# Patient Record
Sex: Female | Born: 1955 | Race: Black or African American | Hispanic: No | Marital: Married | Smoking: Never smoker
Health system: Southern US, Community
[De-identification: ages and names within clinical notes are randomized; demographics above are authoritative.]

## PROBLEM LIST (undated history)

## (undated) ENCOUNTER — Emergency Department (HOSPITAL_COMMUNITY): Payer: 59 | Source: Home / Self Care

## (undated) DIAGNOSIS — I10 Essential (primary) hypertension: Secondary | ICD-10-CM

## (undated) DIAGNOSIS — Z872 Personal history of diseases of the skin and subcutaneous tissue: Secondary | ICD-10-CM

## (undated) DIAGNOSIS — Z7901 Long term (current) use of anticoagulants: Secondary | ICD-10-CM

## (undated) DIAGNOSIS — K649 Unspecified hemorrhoids: Secondary | ICD-10-CM

## (undated) DIAGNOSIS — D259 Leiomyoma of uterus, unspecified: Secondary | ICD-10-CM

## (undated) DIAGNOSIS — K603 Anal fistula: Secondary | ICD-10-CM

## (undated) DIAGNOSIS — I480 Paroxysmal atrial fibrillation: Secondary | ICD-10-CM

## (undated) DIAGNOSIS — K50114 Crohn's disease of large intestine with abscess: Principal | ICD-10-CM

## (undated) DIAGNOSIS — R011 Cardiac murmur, unspecified: Secondary | ICD-10-CM

## (undated) DIAGNOSIS — K6289 Other specified diseases of anus and rectum: Secondary | ICD-10-CM

## (undated) DIAGNOSIS — K5010 Crohn's disease of large intestine without complications: Secondary | ICD-10-CM

## (undated) DIAGNOSIS — Z973 Presence of spectacles and contact lenses: Secondary | ICD-10-CM

## (undated) DIAGNOSIS — E785 Hyperlipidemia, unspecified: Secondary | ICD-10-CM

## (undated) HISTORY — DX: Hyperlipidemia, unspecified: E78.5

## (undated) HISTORY — DX: Crohn's disease of large intestine with abscess: K50.114

## (undated) HISTORY — PX: COLONOSCOPY WITH PROPOFOL: SHX5780

## (undated) HISTORY — DX: Essential (primary) hypertension: I10

## (undated) HISTORY — PX: COLONOSCOPY: SHX174

---

## 2010-11-15 ENCOUNTER — Other Ambulatory Visit (HOSPITAL_COMMUNITY): Payer: Self-pay | Admitting: Family Medicine

## 2010-11-15 DIAGNOSIS — Z1231 Encounter for screening mammogram for malignant neoplasm of breast: Secondary | ICD-10-CM

## 2010-11-23 ENCOUNTER — Other Ambulatory Visit (HOSPITAL_COMMUNITY): Payer: Self-pay | Admitting: Unknown Physician Specialty

## 2010-11-23 DIAGNOSIS — Z1231 Encounter for screening mammogram for malignant neoplasm of breast: Secondary | ICD-10-CM

## 2011-01-04 ENCOUNTER — Ambulatory Visit (HOSPITAL_COMMUNITY): Payer: Self-pay

## 2011-06-18 ENCOUNTER — Ambulatory Visit (HOSPITAL_COMMUNITY)
Admission: RE | Admit: 2011-06-18 | Discharge: 2011-06-18 | Disposition: A | Discharge status: Home / Self Care | Payer: 59 | Source: Ambulatory Visit | Attending: Unknown Physician Specialty | Admitting: Unknown Physician Specialty

## 2011-06-18 DIAGNOSIS — Z1231 Encounter for screening mammogram for malignant neoplasm of breast: Secondary | ICD-10-CM | POA: Insufficient documentation

## 2012-08-11 ENCOUNTER — Other Ambulatory Visit (INDEPENDENT_AMBULATORY_CARE_PROVIDER_SITE_OTHER): Payer: 59

## 2012-08-11 ENCOUNTER — Ambulatory Visit (INDEPENDENT_AMBULATORY_CARE_PROVIDER_SITE_OTHER): Payer: 59 | Admitting: Internal Medicine

## 2012-08-11 ENCOUNTER — Encounter: Payer: Self-pay | Admitting: Internal Medicine

## 2012-08-11 VITALS — BP 138/80 | HR 77 | Temp 97.9°F | Resp 12 | Wt 179.0 lb

## 2012-08-11 DIAGNOSIS — I10 Essential (primary) hypertension: Secondary | ICD-10-CM | POA: Insufficient documentation

## 2012-08-11 DIAGNOSIS — R200 Anesthesia of skin: Secondary | ICD-10-CM | POA: Insufficient documentation

## 2012-08-11 DIAGNOSIS — Z0000 Encounter for general adult medical examination without abnormal findings: Secondary | ICD-10-CM

## 2012-08-11 DIAGNOSIS — Z1231 Encounter for screening mammogram for malignant neoplasm of breast: Secondary | ICD-10-CM

## 2012-08-11 DIAGNOSIS — R011 Cardiac murmur, unspecified: Secondary | ICD-10-CM | POA: Insufficient documentation

## 2012-08-11 DIAGNOSIS — R209 Unspecified disturbances of skin sensation: Secondary | ICD-10-CM

## 2012-08-11 DIAGNOSIS — E785 Hyperlipidemia, unspecified: Secondary | ICD-10-CM

## 2012-08-11 LAB — URINALYSIS, ROUTINE W REFLEX MICROSCOPIC
Ketones, ur: NEGATIVE
Specific Gravity, Urine: <=1.005 (ref 1.000–1.030)
Urine Glucose: NEGATIVE
Urobilinogen, UA: 0.2 (ref 0.0–1.0)

## 2012-08-11 LAB — VITAMIN B12: Vitamin B-12: 244 pg/mL (ref 211–911)

## 2012-08-11 LAB — COMPREHENSIVE METABOLIC PANEL
ALT: 18 U/L (ref 0–35)
AST: 17 U/L (ref 0–37)
Albumin: 4.3 g/dL (ref 3.5–5.2)
Alkaline Phosphatase: 111 U/L (ref 39–117)
Potassium: 3.4 mEq/L — ABNORMAL LOW (ref 3.5–5.1)
Sodium: 136 mEq/L (ref 135–145)
Total Protein: 8.1 g/dL (ref 6.0–8.3)

## 2012-08-11 LAB — LIPID PANEL
Cholesterol: 157 mg/dL (ref 0–200)
HDL: 44 mg/dL (ref >39.00)
LDL Cholesterol: 98 mg/dL (ref 0–99)
VLDL: 15.2 mg/dL (ref 0.0–40.0)

## 2012-08-11 LAB — CBC WITH DIFFERENTIAL/PLATELET
Basophils Absolute: 0 10*3/uL (ref 0.0–0.1)
Eosinophils Absolute: 0.2 10*3/uL (ref 0.0–0.7)
Lymphocytes Relative: 10.9 % — ABNORMAL LOW (ref 12.0–46.0)
MCHC: 33.6 g/dL (ref 30.0–36.0)
MCV: 87.6 fl (ref 78.0–100.0)
Monocytes Absolute: 0.6 10*3/uL (ref 0.1–1.0)
Neutrophils Relative %: 82.8 % — ABNORMAL HIGH (ref 43.0–77.0)
Platelets: 335 10*3/uL (ref 150.0–400.0)

## 2012-08-11 LAB — TSH: TSH: 1.11 u[IU]/mL (ref 0.35–5.50)

## 2012-08-11 MED ORDER — AMLODIPINE BESYLATE 5 MG PO TABS: 5.0000 mg | ORAL_TABLET | Freq: Every day | ORAL | Status: DC

## 2012-08-11 MED ORDER — METOPROLOL SUCCINATE ER 25 MG PO TB24: 25.0000 mg | ORAL_TABLET | Freq: Every day | ORAL | Status: DC

## 2012-08-11 MED ORDER — TRIAMTERENE-HCTZ 37.5-25 MG PO CAPS: 0.5000 | ORAL_CAPSULE | Freq: Every day | ORAL | Status: DC

## 2012-08-11 NOTE — Progress Notes (Signed)
Subjective:    Patient ID: Julie Clark, female    DOB: 24-Jun-1955, 57 y.o.   MRN: [id number]  Hyperlipidemia This is a chronic problem. The current episode started more than 1 year ago. The problem is controlled. Recent lipid tests were reviewed and are variable. She has no history of chronic renal disease, diabetes, hypothyroidism, liver disease, obesity or nephrotic syndrome. Factors aggravating her hyperlipidemia include thiazides. Associated symptoms include myalgias. Pertinent negatives include no chest pain, focal sensory loss, focal weakness, leg pain or shortness of breath. Current antihyperlipidemic treatment includes statins. The current treatment provides moderate improvement of lipids. Compliance problems include adherence to exercise and adherence to diet.       Review of Systems  Constitutional: Positive for fatigue. Negative for fever, chills, diaphoresis, activity change, appetite change and unexpected weight change.  HENT: Negative.   Eyes: Negative.   Respiratory: Negative for cough, chest tightness, shortness of breath, wheezing and stridor.   Cardiovascular: Negative for chest pain, palpitations and leg swelling.  Gastrointestinal: Negative for nausea, vomiting, abdominal pain, diarrhea, constipation and blood in stool.  Endocrine: Negative.   Genitourinary: Negative.   Musculoskeletal: Positive for myalgias. Negative for back pain, joint swelling and arthralgias.  Skin: Negative for color change, pallor, rash and wound.  Allergic/Immunologic: Negative.   Neurological: Positive for numbness (in both arms and hands for one year). Negative for dizziness, tremors, focal weakness, seizures, syncope, facial asymmetry, speech difficulty, weakness, light-headedness and headaches.  Hematological: Negative.  Negative for adenopathy. Does not bruise/bleed easily.  Psychiatric/Behavioral: Negative.        Objective:   Physical Exam  Vitals reviewed. Constitutional: She is  oriented to person, place, and time. She appears well-developed and well-nourished. No distress.  HENT:  Head: Normocephalic and atraumatic.  Mouth/Throat: Oropharynx is clear and moist. No oropharyngeal exudate.  Eyes: Conjunctivae are normal. Right eye exhibits no discharge. Left eye exhibits no discharge. No scleral icterus.  Neck: Normal range of motion. Neck supple. No JVD present. No tracheal deviation present. No thyromegaly present.  Cardiovascular: Normal rate, regular rhythm, S1 normal, S2 normal and intact distal pulses.  PMI is not displaced.  Exam reveals no gallop, no S3, no S4, no distant heart sounds and no friction rub.   Murmur heard.  Decrescendo systolic murmur is present with a grade of 1/6   No diastolic murmur is present  Pulses:      Carotid pulses are 1+ on the right side, and 1+ on the left side.      Radial pulses are 1+ on the right side, and 1+ on the left side.       Femoral pulses are 1+ on the right side, and 1+ on the left side.      Popliteal pulses are 1+ on the right side, and 1+ on the left side.       Dorsalis pedis pulses are 1+ on the right side, and 1+ on the left side.       Posterior tibial pulses are 1+ on the right side, and 1+ on the left side.  Pulmonary/Chest: Effort normal and breath sounds normal. No stridor. No respiratory distress. She has no wheezes. She has no rales. She exhibits no tenderness.  Abdominal: Soft. Bowel sounds are normal. She exhibits no distension and no mass. There is no tenderness. There is no rebound and no guarding.  Genitourinary:  Deferred at her request  Musculoskeletal: Normal range of motion. She exhibits no edema and no tenderness.  Lymphadenopathy:    She has no cervical adenopathy.  Neurological: She is alert and oriented to person, place, and time. She has normal strength. She displays no atrophy, no tremor and normal reflexes. No cranial nerve deficit or sensory deficit. She exhibits normal muscle tone. She  displays a negative Romberg sign. She displays no seizure activity. Coordination and gait normal. She displays no Babinski's sign on the right side. She displays no Babinski's sign on the left side.  Reflex Scores:      Tricep reflexes are 1+ on the right side and 1+ on the left side.      Bicep reflexes are 1+ on the right side and 1+ on the left side.      Brachioradialis reflexes are 1+ on the right side and 1+ on the left side.      Patellar reflexes are 1+ on the right side and 1+ on the left side.      Achilles reflexes are 1+ on the right side and 1+ on the left side. Skin: Skin is warm and dry. No rash noted. She is not diaphoretic. No erythema. No pallor.  Psychiatric: She has a normal mood and affect. Her behavior is normal. Judgment and thought content normal.     No results found for this basename: WBC, HGB, HCT, PLT, GLUCOSE, CHOL, TRIG, HDL, LDLDIRECT, LDLCALC, ALT, AST, NA, K, CL, CREATININE, BUN, CO2, TSH, PSA, INR, GLUF, HGBA1C, MICROALBUR       Assessment & Plan:

## 2012-08-11 NOTE — Assessment & Plan Note (Signed)
She has pain so I have advised her to stop the statin I will check her labs today

## 2012-08-11 NOTE — Assessment & Plan Note (Signed)
EKG is normal today She has no s/s and does not wish to evaluate this any further at this time

## 2012-08-11 NOTE — Assessment & Plan Note (Addendum)
Exam done Vaccines were reviewed and updated Labs ordered Pt ed material was given She will f/up with her GYN for breast/pelvic/rectal exams She was referred today for mammogram and colonoscopy

## 2012-08-11 NOTE — Patient Instructions (Signed)
Preventive Care for Adults, Female A healthy lifestyle and preventive care can promote health and wellness. Preventive health guidelines for women include the following key practices.  A routine yearly physical is a good way to check with your caregiver about your health and preventive screening. It is a chance to share any concerns and updates on your health, and to receive a thorough exam.  Visit your dentist for a routine exam and preventive care every 6 months. Brush your teeth twice a day and floss once a day. Good oral hygiene prevents tooth decay and gum disease.  The frequency of eye exams is based on your age, health, family medical history, use of contact lenses, and other factors. Follow your caregiver's recommendations for frequency of eye exams.  Eat a healthy diet. Foods like vegetables, fruits, whole grains, low-fat dairy products, and lean protein foods contain the nutrients you need without too many calories. Decrease your intake of foods high in solid fats, added sugars, and salt. Eat the right amount of calories for you.Get information about a proper diet from your caregiver, if necessary.  Regular physical exercise is one of the most important things you can do for your health. Most adults should get at least 150 minutes of moderate-intensity exercise (any activity that increases your heart rate and causes you to sweat) each week. In addition, most adults need muscle-strengthening exercises on 2 or more days a week.  Maintain a healthy weight. The body mass index (BMI) is a screening tool to identify possible weight problems. It provides an estimate of body fat based on height and weight. Your caregiver can help determine your BMI, and can help you achieve or maintain a healthy weight.For adults 20 years and older:  A BMI below 18.5 is considered underweight.  A BMI of 18.5 to 24.9 is normal.  A BMI of 25 to 29.9 is considered overweight.  A BMI of 30 and above is  considered obese.  Maintain normal blood lipids and cholesterol levels by exercising and minimizing your intake of saturated fat. Eat a balanced diet with plenty of fruit and vegetables. Blood tests for lipids and cholesterol should begin at age 20 and be repeated every 5 years. If your lipid or cholesterol levels are high, you are over 50, or you are at high risk for heart disease, you may need your cholesterol levels checked more frequently.Ongoing high lipid and cholesterol levels should be treated with medicines if diet and exercise are not effective.  If you smoke, find out from your caregiver how to quit. If you do not use tobacco, do not start.  If you are pregnant, do not drink alcohol. If you are breastfeeding, be very cautious about drinking alcohol. If you are not pregnant and choose to drink alcohol, do not exceed 1 drink per day. One drink is considered to be 12 ounces (355 mL) of beer, 5 ounces (148 mL) of wine, or 1.5 ounces (44 mL) of liquor.  Avoid use of street drugs. Do not share needles with anyone. Ask for help if you need support or instructions about stopping the use of drugs.  High blood pressure causes heart disease and increases the risk of stroke. Your blood pressure should be checked at least every 1 to 2 years. Ongoing high blood pressure should be treated with medicines if weight loss and exercise are not effective.  If you are 55 to 57 years old, ask your caregiver if you should take aspirin to prevent strokes.  Diabetes   screening involves taking a blood sample to check your fasting blood sugar level. This should be done once every 3 years, after age 45, if you are within normal weight and without risk factors for diabetes. Testing should be considered at a younger age or be carried out more frequently if you are overweight and have at least 1 risk factor for diabetes.  Breast cancer screening is essential preventive care for women. You should practice "breast  self-awareness." This means understanding the normal appearance and feel of your breasts and may include breast self-examination. Any changes detected, no matter how small, should be reported to a caregiver. Women in their 20s and 30s should have a clinical breast exam (CBE) by a caregiver as part of a regular health exam every 1 to 3 years. After age 40, women should have a CBE every year. Starting at age 40, women should consider having a mammography (breast X-ray test) every year. Women who have a family history of breast cancer should talk to their caregiver about genetic screening. Women at a high risk of breast cancer should talk to their caregivers about having magnetic resonance imaging (MRI) and a mammography every year.  The Pap test is a screening test for cervical cancer. A Pap test can show cell changes on the cervix that might become cervical cancer if left untreated. A Pap test is a procedure in which cells are obtained and examined from the lower end of the uterus (cervix).  Women should have a Pap test starting at age 21.  Between ages 21 and 29, Pap tests should be repeated every 2 years.  Beginning at age 30, you should have a Pap test every 3 years as long as the past 3 Pap tests have been normal.  Some women have medical problems that increase the chance of getting cervical cancer. Talk to your caregiver about these problems. It is especially important to talk to your caregiver if a new problem develops soon after your last Pap test. In these cases, your caregiver may recommend more frequent screening and Pap tests.  The above recommendations are the same for women who have or have not gotten the vaccine for human papillomavirus (HPV).  If you had a hysterectomy for a problem that was not cancer or a condition that could lead to cancer, then you no longer need Pap tests. Even if you no longer need a Pap test, a regular exam is a good idea to make sure no other problems are  starting.  If you are between ages 65 and 70, and you have had normal Pap tests going back 10 years, you no longer need Pap tests. Even if you no longer need a Pap test, a regular exam is a good idea to make sure no other problems are starting.  If you have had past treatment for cervical cancer or a condition that could lead to cancer, you need Pap tests and screening for cancer for at least 20 years after your treatment.  If Pap tests have been discontinued, risk factors (such as a new sexual partner) need to be reassessed to determine if screening should be resumed.  The HPV test is an additional test that may be used for cervical cancer screening. The HPV test looks for the virus that can cause the cell changes on the cervix. The cells collected during the Pap test can be tested for HPV. The HPV test could be used to screen women aged 30 years and older, and should   be used in women of any age who have unclear Pap test results. After the age of 30, women should have HPV testing at the same frequency as a Pap test.  Colorectal cancer can be detected and often prevented. Most routine colorectal cancer screening begins at the age of 50 and continues through age 75. However, your caregiver may recommend screening at an earlier age if you have risk factors for colon cancer. On a yearly basis, your caregiver may provide home test kits to check for hidden blood in the stool. Use of a small camera at the end of a tube, to directly examine the colon (sigmoidoscopy or colonoscopy), can detect the earliest forms of colorectal cancer. Talk to your caregiver about this at age 50, when routine screening begins. Direct examination of the colon should be repeated every 5 to 10 years through age 75, unless early forms of pre-cancerous polyps or small growths are found.  Hepatitis C blood testing is recommended for all people born from 1945 through 1965 and any individual with known risks for hepatitis C.  Practice  safe sex. Use condoms and avoid high-risk sexual practices to reduce the spread of sexually transmitted infections (STIs). STIs include gonorrhea, chlamydia, syphilis, trichomonas, herpes, HPV, and human immunodeficiency virus (HIV). Herpes, HIV, and HPV are viral illnesses that have no cure. They can result in disability, cancer, and death. Sexually active women aged 25 and younger should be checked for chlamydia. Older women with new or multiple partners should also be tested for chlamydia. Testing for other STIs is recommended if you are sexually active and at increased risk.  Osteoporosis is a disease in which the bones lose minerals and strength with aging. This can result in serious bone fractures. The risk of osteoporosis can be identified using a bone density scan. Women ages 65 and over and women at risk for fractures or osteoporosis should discuss screening with their caregivers. Ask your caregiver whether you should take a calcium supplement or vitamin D to reduce the rate of osteoporosis.  Menopause can be associated with physical symptoms and risks. Hormone replacement therapy is available to decrease symptoms and risks. You should talk to your caregiver about whether hormone replacement therapy is right for you.  Use sunscreen with sun protection factor (SPF) of 30 or more. Apply sunscreen liberally and repeatedly throughout the day. You should seek shade when your shadow is shorter than you. Protect yourself by wearing long sleeves, pants, a wide-brimmed hat, and sunglasses year round, whenever you are outdoors.  Once a month, do a whole body skin exam, using a mirror to look at the skin on your back. Notify your caregiver of new moles, moles that have irregular borders, moles that are larger than a pencil eraser, or moles that have changed in shape or color.  Stay current with required immunizations.  Influenza. You need a dose every fall (or winter). The composition of the flu vaccine  changes each year, so being vaccinated once is not enough.  Pneumococcal polysaccharide. You need 1 to 2 doses if you smoke cigarettes or if you have certain chronic medical conditions. You need 1 dose at age 65 (or older) if you have never been vaccinated.  Tetanus, diphtheria, pertussis (Tdap, Td). Get 1 dose of Tdap vaccine if you are younger than age 65, are over 65 and have contact with an infant, are a healthcare worker, are pregnant, or simply want to be protected from whooping cough. After that, you need a Td   booster dose every 10 years. Consult your caregiver if you have not had at least 3 tetanus and diphtheria-containing shots sometime in your life or have a deep or dirty wound.  HPV. You need this vaccine if you are a woman age 26 or younger. The vaccine is given in 3 doses over 6 months.  Measles, mumps, rubella (MMR). You need at least 1 dose of MMR if you were born in 1957 or later. You may also need a second dose.  Meningococcal. If you are age 19 to 21 and a first-year college student living in a residence hall, or have one of several medical conditions, you need to get vaccinated against meningococcal disease. You may also need additional booster doses.  Zoster (shingles). If you are age 60 or older, you should get this vaccine.  Varicella (chickenpox). If you have never had chickenpox or you were vaccinated but received only 1 dose, talk to your caregiver to find out if you need this vaccine.  Hepatitis A. You need this vaccine if you have a specific risk factor for hepatitis A virus infection or you simply wish to be protected from this disease. The vaccine is usually given as 2 doses, 6 to 18 months apart.  Hepatitis B. You need this vaccine if you have a specific risk factor for hepatitis B virus infection or you simply wish to be protected from this disease. The vaccine is given in 3 doses, usually over 6 months. Preventive Services / Frequency Ages 19 to 39  Blood  pressure check.** / Every 1 to 2 years.  Lipid and cholesterol check.** / Every 5 years beginning at age 20.  Clinical breast exam.** / Every 3 years for women in their 20s and 30s.  Pap test.** / Every 2 years from ages 21 through 29. Every 3 years starting at age 30 through age 65 or 70 with a history of 3 consecutive normal Pap tests.  HPV screening.** / Every 3 years from ages 30 through ages 65 to 70 with a history of 3 consecutive normal Pap tests.  Hepatitis C blood test.** / For any individual with known risks for hepatitis C.  Skin self-exam. / Monthly.  Influenza immunization.** / Every year.  Pneumococcal polysaccharide immunization.** / 1 to 2 doses if you smoke cigarettes or if you have certain chronic medical conditions.  Tetanus, diphtheria, pertussis (Tdap, Td) immunization. / A one-time dose of Tdap vaccine. After that, you need a Td booster dose every 10 years.  HPV immunization. / 3 doses over 6 months, if you are 26 and younger.  Measles, mumps, rubella (MMR) immunization. / You need at least 1 dose of MMR if you were born in 1957 or later. You may also need a second dose.  Meningococcal immunization. / 1 dose if you are age 19 to 21 and a first-year college student living in a residence hall, or have one of several medical conditions, you need to get vaccinated against meningococcal disease. You may also need additional booster doses.  Varicella immunization.** / Consult your caregiver.  Hepatitis A immunization.** / Consult your caregiver. 2 doses, 6 to 18 months apart.  Hepatitis B immunization.** / Consult your caregiver. 3 doses usually over 6 months. Ages 40 to 64  Blood pressure check.** / Every 1 to 2 years.  Lipid and cholesterol check.** / Every 5 years beginning at age 20.  Clinical breast exam.** / Every year after age 40.  Mammogram.** / Every year beginning at age 40   and continuing for as long as you are in good health. Consult with your  caregiver.  Pap test.** / Every 3 years starting at age 30 through age 65 or 70 with a history of 3 consecutive normal Pap tests.  HPV screening.** / Every 3 years from ages 30 through ages 65 to 70 with a history of 3 consecutive normal Pap tests.  Fecal occult blood test (FOBT) of stool. / Every year beginning at age 50 and continuing until age 75. You may not need to do this test if you get a colonoscopy every 10 years.  Flexible sigmoidoscopy or colonoscopy.** / Every 5 years for a flexible sigmoidoscopy or every 10 years for a colonoscopy beginning at age 50 and continuing until age 75.  Hepatitis C blood test.** / For all people born from 1945 through 1965 and any individual with known risks for hepatitis C.  Skin self-exam. / Monthly.  Influenza immunization.** / Every year.  Pneumococcal polysaccharide immunization.** / 1 to 2 doses if you smoke cigarettes or if you have certain chronic medical conditions.  Tetanus, diphtheria, pertussis (Tdap, Td) immunization.** / A one-time dose of Tdap vaccine. After that, you need a Td booster dose every 10 years.  Measles, mumps, rubella (MMR) immunization. / You need at least 1 dose of MMR if you were born in 1957 or later. You may also need a second dose.  Varicella immunization.** / Consult your caregiver.  Meningococcal immunization.** / Consult your caregiver.  Hepatitis A immunization.** / Consult your caregiver. 2 doses, 6 to 18 months apart.  Hepatitis B immunization.** / Consult your caregiver. 3 doses, usually over 6 months. Ages 65 and over  Blood pressure check.** / Every 1 to 2 years.  Lipid and cholesterol check.** / Every 5 years beginning at age 20.  Clinical breast exam.** / Every year after age 40.  Mammogram.** / Every year beginning at age 40 and continuing for as long as you are in good health. Consult with your caregiver.  Pap test.** / Every 3 years starting at age 30 through age 65 or 70 with a 3  consecutive normal Pap tests. Testing can be stopped between 65 and 70 with 3 consecutive normal Pap tests and no abnormal Pap or HPV tests in the past 10 years.  HPV screening.** / Every 3 years from ages 30 through ages 65 or 70 with a history of 3 consecutive normal Pap tests. Testing can be stopped between 65 and 70 with 3 consecutive normal Pap tests and no abnormal Pap or HPV tests in the past 10 years.  Fecal occult blood test (FOBT) of stool. / Every year beginning at age 50 and continuing until age 75. You may not need to do this test if you get a colonoscopy every 10 years.  Flexible sigmoidoscopy or colonoscopy.** / Every 5 years for a flexible sigmoidoscopy or every 10 years for a colonoscopy beginning at age 50 and continuing until age 75.  Hepatitis C blood test.** / For all people born from 1945 through 1965 and any individual with known risks for hepatitis C.  Osteoporosis screening.** / A one-time screening for women ages 65 and over and women at risk for fractures or osteoporosis.  Skin self-exam. / Monthly.  Influenza immunization.** / Every year.  Pneumococcal polysaccharide immunization.** / 1 dose at age 65 (or older) if you have never been vaccinated.  Tetanus, diphtheria, pertussis (Tdap, Td) immunization. / A one-time dose of Tdap vaccine if you are over   65 and have contact with an infant, are a healthcare worker, or simply want to be protected from whooping cough. After that, you need a Td booster dose every 10 years.  Varicella immunization.** / Consult your caregiver.  Meningococcal immunization.** / Consult your caregiver.  Hepatitis A immunization.** / Consult your caregiver. 2 doses, 6 to 18 months apart.  Hepatitis B immunization.** / Check with your caregiver. 3 doses, usually over 6 months. ** Family history and personal history of risk and conditions may change your caregiver's recommendations. Document Released: 07/10/2001 Document Revised: 08/06/2011  Document Reviewed: 10/09/2010 [employer] Patient Information 2013 [employer].  

## 2012-08-11 NOTE — Assessment & Plan Note (Signed)
I will check her labs today to look for secondary causes for this I have also asked her to get NCS/EMG done to see what the cause is

## 2012-08-13 ENCOUNTER — Telehealth: Payer: Self-pay | Admitting: Internal Medicine

## 2012-08-13 NOTE — Telephone Encounter (Signed)
Wants clarity on what dosage of blood pressure medicine she needs to be taking.

## 2012-08-20 ENCOUNTER — Other Ambulatory Visit: Payer: Self-pay | Admitting: Internal Medicine

## 2012-08-20 DIAGNOSIS — I10 Essential (primary) hypertension: Secondary | ICD-10-CM

## 2012-08-20 MED ORDER — METOPROLOL SUCCINATE ER 25 MG PO TB24: 25.0000 mg | ORAL_TABLET | Freq: Two times a day (BID) | ORAL | Status: DC

## 2012-08-27 ENCOUNTER — Other Ambulatory Visit: Payer: Self-pay

## 2012-08-27 MED ORDER — TRIAMTERENE-HCTZ 37.5-25 MG PO TABS: 0.5000 | ORAL_TABLET | Freq: Every day | ORAL | Status: DC

## 2012-08-27 NOTE — Telephone Encounter (Signed)
Pharmacy [facility]) notified

## 2012-08-27 NOTE — Telephone Encounter (Signed)
Pharmacy called stating pt went to pick up Rx for Triamterene yesterday but dose was changed from 1/2 tablet daily to 1 capsule daily. Pt stated that she was not aware of any changes/increases in medication and did not pick up Rx. Pharmacy is requesting clarification, please advise.

## 2012-08-27 NOTE — Telephone Encounter (Signed)
I was able to find Rx for Triamterene HCTZ 37.5/25 and changed directions to 1/2 tab daily. Okay to update and resend?

## 2012-08-27 NOTE — Telephone Encounter (Signed)
1/2 tab is fine with me but it is not an option in [employer] to write an Rx that way

## 2012-10-01 ENCOUNTER — Encounter: Payer: Self-pay | Admitting: Internal Medicine

## 2012-10-01 ENCOUNTER — Ambulatory Visit (INDEPENDENT_AMBULATORY_CARE_PROVIDER_SITE_OTHER): Payer: 59 | Admitting: Internal Medicine

## 2012-10-01 ENCOUNTER — Other Ambulatory Visit (INDEPENDENT_AMBULATORY_CARE_PROVIDER_SITE_OTHER): Payer: 59

## 2012-10-01 VITALS — BP 128/72 | HR 81 | Temp 97.8°F | Resp 16 | Wt 179.0 lb

## 2012-10-01 DIAGNOSIS — L723 Sebaceous cyst: Secondary | ICD-10-CM

## 2012-10-01 DIAGNOSIS — I10 Essential (primary) hypertension: Secondary | ICD-10-CM

## 2012-10-01 DIAGNOSIS — E876 Hypokalemia: Secondary | ICD-10-CM

## 2012-10-01 DIAGNOSIS — R55 Syncope and collapse: Secondary | ICD-10-CM

## 2012-10-01 DIAGNOSIS — R011 Cardiac murmur, unspecified: Secondary | ICD-10-CM

## 2012-10-01 LAB — COMPREHENSIVE METABOLIC PANEL
ALT: 14 U/L (ref 0–35)
AST: 16 U/L (ref 0–37)
Creatinine, Ser: 0.9 mg/dL (ref 0.4–1.2)
Total Bilirubin: 0.6 mg/dL (ref 0.3–1.2)

## 2012-10-01 LAB — MAGNESIUM: Magnesium: 1.8 mg/dL (ref 1.5–2.5)

## 2012-10-01 MED ORDER — SULFAMETHOXAZOLE-TRIMETHOPRIM 800-160 MG PO TABS: 1.0000 | ORAL_TABLET | Freq: Two times a day (BID) | ORAL | Status: DC

## 2012-10-01 MED ORDER — HYDROCHLOROTHIAZIDE 25 MG PO TABS: 25.0000 mg | ORAL_TABLET | Freq: Every day | ORAL | Status: DC

## 2012-10-01 NOTE — Patient Instructions (Signed)
Syncope Syncope is a fainting spell. This means the person loses consciousness and drops to the ground. The person is generally unconscious for less than 5 minutes. The person may have some muscle twitches for up to 15 seconds before waking up and returning to normal. Syncope occurs more often in elderly people, but it can happen to anyone. While most causes of syncope are not dangerous, syncope can be a sign of a serious medical problem. It is important to seek medical care.  CAUSES  Syncope is caused by a sudden decrease in blood flow to the brain. The specific cause is often not determined. Factors that can trigger syncope include:  Taking medicines that lower blood pressure.  Sudden changes in posture, such as standing up suddenly.  Taking more medicine than prescribed.  Standing in one place for too long.  Seizure disorders.  Dehydration and excessive exposure to heat.  Low blood sugar (hypoglycemia).  Straining to have a bowel movement.  Heart disease, irregular heartbeat, or other circulatory problems.  Fear, emotional distress, seeing blood, or severe pain. SYMPTOMS  Right before fainting, you may:  Feel dizzy or lightheaded.  Feel nauseous.  See all white or all black in your field of vision.  Have cold, clammy skin. DIAGNOSIS  Your caregiver will ask about your symptoms, perform a physical exam, and perform electrocardiography (ECG) to record the electrical activity of your heart. Your caregiver may also perform other heart or blood tests to determine the cause of your syncope. TREATMENT  In most cases, no treatment is needed. Depending on the cause of your syncope, your caregiver may recommend changing or stopping some of your medicines. HOME CARE INSTRUCTIONS  Have someone stay with you until you feel stable.  Do not drive, operate machinery, or play sports until your caregiver says it is okay.  Keep all follow-up appointments as directed by your  caregiver.  Lie down right away if you start feeling like you might faint. Breathe deeply and steadily. Wait until all the symptoms have passed.  Drink enough fluids to keep your urine clear or pale yellow.  If you are taking blood pressure or heart medicine, get up slowly, taking several minutes to sit and then stand. This can reduce dizziness. SEEK IMMEDIATE MEDICAL CARE IF:   You have a severe headache.  You have unusual pain in the chest, abdomen, or back.  You are bleeding from the mouth or rectum, or you have black or tarry stool.  You have an irregular or very fast heartbeat.  You have pain with breathing.  You have repeated fainting or seizure-like jerking during an episode.  You faint when sitting or lying down.  You have confusion.  You have difficulty walking.  You have severe weakness.  You have vision problems. If you fainted, call your local emergency services (911 in [place] Do not drive yourself to the hospital.  MAKE SURE YOU:  Understand these instructions.  Will watch your condition.  Will get help right away if you are not doing well or get worse. Document Released: 05/14/2005 Document Revised: 11/13/2011 Document Reviewed: 07/13/2011 [facility] Patient Information 2013 [place].

## 2012-10-01 NOTE — Assessment & Plan Note (Signed)
This sounds like it was caused by low BP, orthostasis I have decreased her diuretic dose I have asked her to get an updated ECHO done Have also ordered an event monitor to see if she has a dysrhythmia

## 2012-10-01 NOTE — Progress Notes (Signed)
Subjective:    Patient ID: Julie Clark, female    DOB: August 04, 1955, 57 y.o.   MRN: [id number]  Loss of Consciousness This is a new problem. Episode onset: 3 days ago. The problem occurs rarely. Progression since onset: 1 episode, no recurrence. She lost consciousness for a period of less than 1 minute. Exacerbated by: her BP was 100/70, she felt lightheaded while getting out of her tub. Associated symptoms include light-headedness. Pertinent negatives include no abdominal pain, auditory change, aura, back pain, bladder incontinence, bowel incontinence, chest pain, clumsiness, confusion, diaphoresis, dizziness, fever, focal sensory loss, focal weakness, headaches, malaise/fatigue, nausea, palpitations, slurred speech, vertigo, visual change, vomiting or weakness. She has tried nothing for the symptoms. Her past medical history is significant for CAD and HTN. There is no history of arrhythmia, seizures, a sudden death in family, TIA or vertigo.      Review of Systems  Constitutional: Negative.  Negative for fever, chills, malaise/fatigue, diaphoresis, appetite change, fatigue and unexpected weight change.  HENT: Negative.   Eyes: Negative.   Respiratory: Negative.  Negative for apnea, cough, choking, chest tightness, shortness of breath, wheezing and stridor.   Cardiovascular: Positive for syncope. Negative for chest pain and palpitations.  Gastrointestinal: Negative.  Negative for nausea, vomiting, abdominal pain and bowel incontinence.  Endocrine: Negative.   Genitourinary: Negative.  Negative for bladder incontinence.  Musculoskeletal: Negative.  Negative for back pain.  Skin: Negative.   Allergic/Immunologic: Negative.   Neurological: Positive for light-headedness. Negative for dizziness, vertigo, tremors, focal weakness, seizures, syncope, facial asymmetry, speech difficulty, weakness, numbness and headaches.  Hematological: Negative.  Negative for adenopathy. Does not bruise/bleed easily.    Psychiatric/Behavioral: Negative.  Negative for confusion.       Objective:   Physical Exam  Vitals reviewed. Constitutional: She is oriented to person, place, and time. She appears well-developed and well-nourished.  Non-toxic appearance. She does not have a sickly appearance. She does not appear ill. No distress.  HENT:  Head: Normocephalic and atraumatic.  Mouth/Throat: Oropharynx is clear and moist. No oropharyngeal exudate.  Eyes: Conjunctivae are normal. Right eye exhibits no discharge. Left eye exhibits no discharge. No scleral icterus.  Neck: Normal range of motion. Neck supple. No JVD present. No tracheal deviation present. No thyromegaly present.  Cardiovascular: Normal rate, regular rhythm, S1 normal, S2 normal and intact distal pulses.  Exam reveals no gallop and no friction rub.   Murmur heard.  Decrescendo systolic murmur is present with a grade of 2/6   No diastolic murmur is present  Pulses:      Carotid pulses are 1+ on the right side, and 1+ on the left side.      Radial pulses are 1+ on the right side, and 1+ on the left side.       Femoral pulses are 1+ on the right side, and 1+ on the left side.      Popliteal pulses are 1+ on the right side, and 1+ on the left side.       Dorsalis pedis pulses are 1+ on the right side, and 1+ on the left side.       Posterior tibial pulses are 1+ on the right side, and 1+ on the left side.  Pulmonary/Chest: Effort normal and breath sounds normal. No stridor. No respiratory distress. She has no wheezes. She has no rales. Chest wall is not dull to percussion. She exhibits mass and deformity. She exhibits no tenderness, no bony tenderness, no laceration, no crepitus, no  edema, no swelling and no retraction.    Abdominal: Soft. Bowel sounds are normal. She exhibits no distension and no mass. There is no tenderness. There is no rebound and no guarding.  Musculoskeletal: Normal range of motion. She exhibits no edema.  Lymphadenopathy:     She has no cervical adenopathy.  Neurological: She is oriented to person, place, and time.  Skin: Skin is warm and dry. No rash noted. She is not diaphoretic. No erythema. No pallor.  Psychiatric: She has a normal mood and affect. Her behavior is normal. Judgment and thought content normal.     Lab Results  Component Value Date   WBC 12.7* 08/11/2012   HGB 12.6 08/11/2012   HCT 37.5 08/11/2012   PLT 335.0 08/11/2012   GLUCOSE 95 08/11/2012   CHOL 157 08/11/2012   TRIG 76.0 08/11/2012   HDL 44.00 08/11/2012   LDLCALC 98 08/11/2012   ALT 18 08/11/2012   AST 17 08/11/2012   NA 136 08/11/2012   K 3.4* 08/11/2012   CL 100 08/11/2012   CREATININE 0.8 08/11/2012   BUN 12 08/11/2012   CO2 26 08/11/2012   TSH 1.11 08/11/2012       Assessment & Plan:

## 2012-10-01 NOTE — Assessment & Plan Note (Signed)
This appears to be infected I will treat with bactrim-ds

## 2012-10-01 NOTE — Assessment & Plan Note (Signed)
I will recheck her K+ level and will check her Mg++ level as well

## 2012-10-01 NOTE — Assessment & Plan Note (Signed)
I think her BP may a be a little too low I have asker her to change from maxzide to plain hctz

## 2012-10-01 NOTE — Assessment & Plan Note (Signed)
In light of her syncopal episode I have asked her to get an ECHO done

## 2012-10-03 ENCOUNTER — Other Ambulatory Visit (HOSPITAL_COMMUNITY): Payer: 59

## 2012-10-08 ENCOUNTER — Other Ambulatory Visit: Payer: Self-pay

## 2012-10-08 ENCOUNTER — Ambulatory Visit (HOSPITAL_COMMUNITY): Payer: 59 | Attending: Internal Medicine | Admitting: Radiology

## 2012-10-08 ENCOUNTER — Telehealth: Payer: Self-pay | Admitting: *Deleted

## 2012-10-08 ENCOUNTER — Other Ambulatory Visit (HOSPITAL_COMMUNITY): Payer: 59

## 2012-10-08 ENCOUNTER — Encounter (INDEPENDENT_AMBULATORY_CARE_PROVIDER_SITE_OTHER): Payer: 59

## 2012-10-08 DIAGNOSIS — R011 Cardiac murmur, unspecified: Secondary | ICD-10-CM

## 2012-10-08 DIAGNOSIS — I10 Essential (primary) hypertension: Secondary | ICD-10-CM | POA: Insufficient documentation

## 2012-10-08 DIAGNOSIS — I079 Rheumatic tricuspid valve disease, unspecified: Secondary | ICD-10-CM | POA: Insufficient documentation

## 2012-10-08 DIAGNOSIS — R55 Syncope and collapse: Secondary | ICD-10-CM | POA: Insufficient documentation

## 2012-10-08 DIAGNOSIS — I059 Rheumatic mitral valve disease, unspecified: Secondary | ICD-10-CM | POA: Insufficient documentation

## 2012-10-08 HISTORY — PX: TRANSTHORACIC ECHOCARDIOGRAM: SHX275

## 2012-10-08 NOTE — Telephone Encounter (Signed)
21 day event monitor placed on Pt 10/08/12 TK

## 2012-10-08 NOTE — Progress Notes (Signed)
Echocardiogram performed.  

## 2012-10-09 ENCOUNTER — Encounter: Payer: Self-pay | Admitting: Internal Medicine

## 2012-10-22 ENCOUNTER — Encounter: Payer: Self-pay | Admitting: Internal Medicine

## 2012-10-27 ENCOUNTER — Encounter: Payer: Self-pay | Admitting: Internal Medicine

## 2012-10-28 ENCOUNTER — Ambulatory Visit (HOSPITAL_COMMUNITY)
Admission: RE | Admit: 2012-10-28 | Discharge: 2012-10-28 | Disposition: A | Discharge status: Home / Self Care | Payer: 59 | Source: Ambulatory Visit | Attending: Internal Medicine | Admitting: Internal Medicine

## 2012-10-28 DIAGNOSIS — Z1231 Encounter for screening mammogram for malignant neoplasm of breast: Secondary | ICD-10-CM | POA: Insufficient documentation

## 2012-10-29 LAB — HM MAMMOGRAPHY

## 2012-10-30 ENCOUNTER — Other Ambulatory Visit: Payer: Self-pay | Admitting: Internal Medicine

## 2012-10-30 DIAGNOSIS — N6320 Unspecified lump in the left breast, unspecified quadrant: Secondary | ICD-10-CM

## 2012-10-30 DIAGNOSIS — R928 Other abnormal and inconclusive findings on diagnostic imaging of breast: Secondary | ICD-10-CM

## 2012-11-03 ENCOUNTER — Telehealth: Payer: Self-pay | Admitting: Internal Medicine

## 2012-11-03 NOTE — Telephone Encounter (Signed)
Please call pt at her work number to inform about further tests.

## 2012-11-03 NOTE — Telephone Encounter (Signed)
Further testing needs to be done before was can discuss

## 2012-11-03 NOTE — Telephone Encounter (Signed)
My Chart has her Mammogram results.  It says she may have a mass.  She has heard anything from the office.  She wants to be worked in today or tomorrow AM to discuss these results.

## 2012-11-03 NOTE — Telephone Encounter (Signed)
Patient stopped by office, brought back and spoke with [facility] who explained why she was not notified of results sooner and the follow up testing needed. Pt has been set up for additional testing.

## 2012-11-04 ENCOUNTER — Ambulatory Visit
Admission: RE | Admit: 2012-11-04 | Discharge: 2012-11-04 | Disposition: A | Discharge status: Home / Self Care | Payer: 59 | Source: Ambulatory Visit | Attending: Internal Medicine | Admitting: Internal Medicine

## 2012-11-04 ENCOUNTER — Encounter: Payer: Self-pay | Admitting: Internal Medicine

## 2012-11-04 DIAGNOSIS — R928 Other abnormal and inconclusive findings on diagnostic imaging of breast: Secondary | ICD-10-CM

## 2012-11-06 ENCOUNTER — Telehealth: Payer: Self-pay

## 2012-11-06 NOTE — Telephone Encounter (Signed)
Spoke with patient this morning and advised of results of mammogram. She expressed continued concern with being able to view mychart results before being contacted by MD office. She wanted to find out what MD recommends as far as follow up appointment or any other suggestions reargeding her mychart ECHO results.   By Salome Arnt - last viewed at 1:01 PM on 10/27/2012 Message Body The echo shows that you have grade I diastolic dysfunction and the aortic valve is somewhat thickened. You will need to keep good control of your blood pressure and will watch you closely for signs of heart failure.    Please advise Thanks

## 2012-11-06 NOTE — Telephone Encounter (Signed)
You will need to keep good control of your blood pressure and will watch you closely for signs of heart failure.

## 2012-11-06 NOTE — Telephone Encounter (Signed)
Pt notified and follow up appt scheduled

## 2012-11-17 ENCOUNTER — Encounter: Payer: Self-pay | Admitting: Internal Medicine

## 2012-11-17 ENCOUNTER — Ambulatory Visit (INDEPENDENT_AMBULATORY_CARE_PROVIDER_SITE_OTHER): Payer: 59 | Admitting: Internal Medicine

## 2012-11-17 VITALS — BP 142/78 | HR 79 | Temp 98.1°F | Resp 12 | Wt 179.0 lb

## 2012-11-17 DIAGNOSIS — I10 Essential (primary) hypertension: Secondary | ICD-10-CM

## 2012-11-17 DIAGNOSIS — R011 Cardiac murmur, unspecified: Secondary | ICD-10-CM

## 2012-11-17 DIAGNOSIS — R55 Syncope and collapse: Secondary | ICD-10-CM

## 2012-11-17 NOTE — Assessment & Plan Note (Signed)
She has DD so will control her BP and she will let me know if she develops any s/s of CHF

## 2012-11-17 NOTE — Assessment & Plan Note (Addendum)
She has not had any more syncopal episodes Her ECHO showed grade I DD, the event monitor was done but there is no report yet I await the monitor results and will follow her for now

## 2012-11-17 NOTE — Assessment & Plan Note (Signed)
Her BP is adequately controlled

## 2012-11-17 NOTE — Patient Instructions (Signed)

## 2012-11-17 NOTE — Progress Notes (Signed)
  Subjective:    Patient ID: Julie Clark, female    DOB: [birth year], 57 y.o.   MRN: [id number]  Hypertension This is a chronic problem. The current episode started more than 1 year ago. The problem is unchanged. The problem is controlled. Pertinent negatives include no anxiety, blurred vision, chest pain, headaches, malaise/fatigue, neck pain, orthopnea, palpitations, peripheral edema, PND, shortness of breath or sweats. Past treatments include diuretics, calcium channel blockers and beta blockers. The current treatment provides moderate improvement. Compliance problems include exercise and diet.  Hypertensive end-organ damage includes heart failure.      Review of Systems  Constitutional: Negative.  Negative for malaise/fatigue.  HENT: Negative.  Negative for neck pain.   Eyes: Negative.  Negative for blurred vision.  Respiratory: Negative.  Negative for apnea, cough, choking, chest tightness, shortness of breath, wheezing and stridor.   Cardiovascular: Negative for chest pain, palpitations, orthopnea, leg swelling and PND.  Gastrointestinal: Negative.   Endocrine: Negative.   Genitourinary: Negative.   Musculoskeletal: Negative.   Skin: Negative.   Allergic/Immunologic: Negative.   Neurological: Negative.  Negative for dizziness, syncope, speech difficulty, weakness, light-headedness and headaches.  Hematological: Negative.   Psychiatric/Behavioral: Negative.        Objective:   Physical Exam  Vitals reviewed. Constitutional: She is oriented to person, place, and time. She appears well-developed and well-nourished. No distress.  HENT:  Head: Normocephalic and atraumatic.  Mouth/Throat: Oropharynx is clear and moist. No oropharyngeal exudate.  Eyes: Conjunctivae are normal. Right eye exhibits no discharge. Left eye exhibits no discharge. No scleral icterus.  Neck: Normal range of motion. Neck supple. No JVD present. No tracheal deviation present. No thyromegaly present.   Cardiovascular: Normal rate, regular rhythm and intact distal pulses.  Exam reveals no gallop and no friction rub.   Murmur heard. Pulmonary/Chest: Effort normal and breath sounds normal. No stridor. No respiratory distress. She has no wheezes. She has no rales. She exhibits no tenderness.  Abdominal: Soft. Bowel sounds are normal. She exhibits no distension and no mass. There is no tenderness. There is no rebound and no guarding.  Musculoskeletal: Normal range of motion. She exhibits no edema and no tenderness.  Lymphadenopathy:    She has no cervical adenopathy.  Neurological: She is oriented to person, place, and time.  Skin: Skin is warm and dry. No rash noted. She is not diaphoretic. No erythema. No pallor.  Psychiatric: She has a normal mood and affect. Her behavior is normal. Judgment and thought content normal.     Lab Results  Component Value Date   WBC 12.7* 08/11/2012   HGB 12.6 08/11/2012   HCT 37.5 08/11/2012   PLT 335.0 08/11/2012   GLUCOSE 96 10/01/2012   CHOL 157 08/11/2012   TRIG 76.0 08/11/2012   HDL 44.00 08/11/2012   LDLCALC 98 08/11/2012   ALT 14 10/01/2012   AST 16 10/01/2012   NA 136 10/01/2012   K 3.7 10/01/2012   CL 100 10/01/2012   CREATININE 0.9 10/01/2012   BUN 13 10/01/2012   CO2 27 10/01/2012   TSH 1.11 08/11/2012       Assessment & Plan:

## 2012-12-19 ENCOUNTER — Ambulatory Visit (AMBULATORY_SURGERY_CENTER): Payer: 59 | Admitting: *Deleted

## 2012-12-19 VITALS — Ht 65.0 in | Wt 179.0 lb

## 2012-12-19 DIAGNOSIS — Z1211 Encounter for screening for malignant neoplasm of colon: Secondary | ICD-10-CM

## 2013-01-02 ENCOUNTER — Other Ambulatory Visit: Payer: 59 | Admitting: Gastroenterology

## 2013-01-23 ENCOUNTER — Ambulatory Visit (AMBULATORY_SURGERY_CENTER): Payer: 59 | Admitting: Gastroenterology

## 2013-01-23 ENCOUNTER — Encounter: Payer: Self-pay | Admitting: Gastroenterology

## 2013-01-23 VITALS — BP 122/76 | HR 61 | Temp 97.1°F | Resp 17 | Ht 65.0 in | Wt 179.0 lb

## 2013-01-23 DIAGNOSIS — Z1211 Encounter for screening for malignant neoplasm of colon: Secondary | ICD-10-CM

## 2013-01-23 DIAGNOSIS — R933 Abnormal findings on diagnostic imaging of other parts of digestive tract: Secondary | ICD-10-CM

## 2013-01-23 DIAGNOSIS — K5289 Other specified noninfective gastroenteritis and colitis: Secondary | ICD-10-CM

## 2013-01-23 MED ORDER — SODIUM CHLORIDE 0.9 % IV SOLN: 500.0000 mL | INTRAVENOUS | Status: DC

## 2013-01-23 NOTE — Progress Notes (Signed)
The pt tolerated the colonoscopy very well. Maw   

## 2013-01-23 NOTE — Progress Notes (Signed)
Patient did not have preoperative order for IV antibiotic SSI prophylaxis. (G8918)  Patient did not experience any of the following events: a burn prior to discharge; a fall within the facility; wrong site/side/patient/procedure/implant event; or a hospital transfer or hospital admission upon discharge from the facility. (G8907)  

## 2013-01-23 NOTE — Patient Instructions (Addendum)

## 2013-01-23 NOTE — Op Note (Signed)
[place]  [address]. [place], [id number]   COLONOSCOPY PROCEDURE REPORT  PATIENT: Julie Clark, Julie Clark  MR#: [id number] BIRTHDATE: 12/06/1955 , 56  yrs. old GENDER: Female ENDOSCOPIST: Milus Banister, MD REFERRED ZH:[id number] Evalina Field, M.D. PROCEDURE DATE:  01/23/2013 PROCEDURE:   Colonoscopy with biopsy First Screening Colonoscopy - Avg.  risk and is 50 yrs.  old or older Yes.  Prior Negative Screening - Now for repeat screening. N/A  History of Adenoma - Now for follow-up colonoscopy & has been > or = to 3 yrs.  N/A  Polyps Removed Today? No.  Recommend repeat exam, <10 yrs? No. ASA CLASS:   Class II INDICATIONS:average risk screening. MEDICATIONS: Fentanyl 75 mcg IV, Versed 6 mg IV, and These medications were titrated to patient response per physician's verbal order DESCRIPTION OF PROCEDURE:   After the risks benefits and alternatives of the procedure were thoroughly explained, informed consent was obtained.  A digital rectal exam revealed no abnormalities of the rectum.   The Pentax Ped Colon [id number] endoscope was introduced through the anus and advanced to the terminal ileum which was intubated for a short distance. No adverse events experienced.   The quality of the prep was good, using MoviPrep  The instrument was then slowly withdrawn as the colon was fully examined.   COLON FINDINGS: The terminal ileum was normal.  There were multiple punctate erosions throughout cecum and distal ascending colon and the mucosa in that region was very friable.  Multiple biopsies were taken and sent to pathology.  The remaining examination of the colon was completely normal.  Retroflexed views revealed no abnormalities. The time to cecum=2 minutes 46 seconds.  Withdrawal time=16 minutes 28 seconds.  The scope was withdrawn and the procedure completed. COMPLICATIONS: There were no complications.  ENDOSCOPIC IMPRESSION: Inflammation in cecum and ascending colon, biopsied.  This is likely due to daily NSAID use for arthritis pains.  The remaining examination of the colon and terminal ileum was completely normal. No polyps or cancers  RECOMMENDATIONS: You should continue to follow colorectal cancer screening guidelines for "routine risk" patients with a repeat colonoscopy in 10 years. There is no need for FOBT (stool) testing for at least 5 years. You should try to cut back on daily NSAID use.  If tylenol works for your arthritis pains you should use that instead.   eSigned:  Milus Banister, MD 01/23/2013 8:51 AM

## 2013-01-23 NOTE — Progress Notes (Signed)
No egg or soy allergy. ewm

## 2013-01-27 ENCOUNTER — Telehealth: Payer: Self-pay | Admitting: *Deleted

## 2013-01-27 NOTE — Telephone Encounter (Signed)
  Follow up Call-  Call back number 01/23/2013  Post procedure Call Back phone  # [phone]  Permission to leave phone message Yes     Patient questions:  Do you have a fever, pain , or abdominal swelling? no Pain Score  0 *  Have you tolerated food without any problems? yes  Have you been able to return to your normal activities? yes  Do you have any questions about your discharge instructions: Diet   no Medications  no Follow up visit  no  Do you have questions or concerns about your Care? no  Actions: * If pain score is 4 or above: No action needed, pain <4.

## 2013-04-02 ENCOUNTER — Other Ambulatory Visit: Payer: Self-pay

## 2013-04-06 ENCOUNTER — Other Ambulatory Visit: Payer: Self-pay | Admitting: Internal Medicine

## 2013-09-11 ENCOUNTER — Telehealth: Payer: Self-pay | Admitting: Gastroenterology

## 2013-09-11 MED ORDER — PANTOPRAZOLE SODIUM 40 MG PO TBEC: 40.0000 mg | DELAYED_RELEASE_TABLET | Freq: Every day | ORAL | Status: DC

## 2013-09-11 NOTE — Telephone Encounter (Signed)
She will be starting daily NSAIDs for knee pains, I recommended she begin once daily PPI as well

## 2013-09-29 ENCOUNTER — Other Ambulatory Visit: Payer: Self-pay | Admitting: *Deleted

## 2013-09-29 ENCOUNTER — Other Ambulatory Visit: Payer: Self-pay | Admitting: Internal Medicine

## 2013-09-29 DIAGNOSIS — I10 Essential (primary) hypertension: Secondary | ICD-10-CM

## 2013-09-29 MED ORDER — AMLODIPINE BESYLATE 5 MG PO TABS: 5.0000 mg | ORAL_TABLET | Freq: Every day | ORAL | Status: DC

## 2013-09-29 MED ORDER — METOPROLOL SUCCINATE ER 25 MG PO TB24: 25.0000 mg | ORAL_TABLET | Freq: Two times a day (BID) | ORAL | Status: DC

## 2013-09-30 ENCOUNTER — Telehealth: Payer: Self-pay | Admitting: Internal Medicine

## 2013-09-30 NOTE — Telephone Encounter (Signed)
Relevant patient education mailed to patient.  

## 2013-10-06 ENCOUNTER — Encounter: Payer: Self-pay | Admitting: Internal Medicine

## 2013-10-06 ENCOUNTER — Ambulatory Visit (INDEPENDENT_AMBULATORY_CARE_PROVIDER_SITE_OTHER): Payer: 59 | Admitting: Internal Medicine

## 2013-10-06 ENCOUNTER — Other Ambulatory Visit (HOSPITAL_COMMUNITY)
Admission: RE | Admit: 2013-10-06 | Discharge: 2013-10-06 | Disposition: A | Discharge status: Home / Self Care | Payer: 59 | Source: Ambulatory Visit | Attending: Internal Medicine | Admitting: Internal Medicine

## 2013-10-06 ENCOUNTER — Other Ambulatory Visit (INDEPENDENT_AMBULATORY_CARE_PROVIDER_SITE_OTHER): Payer: 59

## 2013-10-06 VITALS — BP 140/72 | HR 74 | Temp 97.7°F | Resp 16 | Ht 65.0 in | Wt 185.0 lb

## 2013-10-06 DIAGNOSIS — R011 Cardiac murmur, unspecified: Secondary | ICD-10-CM

## 2013-10-06 DIAGNOSIS — I10 Essential (primary) hypertension: Secondary | ICD-10-CM

## 2013-10-06 DIAGNOSIS — Z0000 Encounter for general adult medical examination without abnormal findings: Secondary | ICD-10-CM

## 2013-10-06 DIAGNOSIS — IMO0002 Reserved for concepts with insufficient information to code with codable children: Secondary | ICD-10-CM

## 2013-10-06 DIAGNOSIS — Z1231 Encounter for screening mammogram for malignant neoplasm of breast: Secondary | ICD-10-CM

## 2013-10-06 DIAGNOSIS — M179 Osteoarthritis of knee, unspecified: Secondary | ICD-10-CM

## 2013-10-06 DIAGNOSIS — M1710 Unilateral primary osteoarthritis, unspecified knee: Secondary | ICD-10-CM

## 2013-10-06 DIAGNOSIS — Z124 Encounter for screening for malignant neoplasm of cervix: Secondary | ICD-10-CM

## 2013-10-06 DIAGNOSIS — Z01419 Encounter for gynecological examination (general) (routine) without abnormal findings: Secondary | ICD-10-CM | POA: Insufficient documentation

## 2013-10-06 LAB — CBC WITH DIFFERENTIAL/PLATELET
Basophils Absolute: 0 10*3/uL (ref 0.0–0.1)
Basophils Relative: 0.3 % (ref 0.0–3.0)
EOS PCT: 1.4 % (ref 0.0–5.0)
Eosinophils Absolute: 0.1 10*3/uL (ref 0.0–0.7)
HCT: 39.5 % (ref 36.0–46.0)
Hemoglobin: 13.1 g/dL (ref 12.0–15.0)
LYMPHS PCT: 12.3 % (ref 12.0–46.0)
Lymphs Abs: 1.3 10*3/uL (ref 0.7–4.0)
MCHC: 33.2 g/dL (ref 30.0–36.0)
MCV: 87.7 fl (ref 78.0–100.0)
MONO ABS: 0.5 10*3/uL (ref 0.1–1.0)
MONOS PCT: 4.3 % (ref 3.0–12.0)
NEUTROS PCT: 81.7 % — AB (ref 43.0–77.0)
Neutro Abs: 8.9 10*3/uL — ABNORMAL HIGH (ref 1.4–7.7)
PLATELETS: 359 10*3/uL (ref 150.0–400.0)
RBC: 4.5 Mil/uL (ref 3.87–5.11)
RDW: 13.7 % (ref 11.5–15.5)
WBC: 10.9 10*3/uL — AB (ref 4.0–10.5)

## 2013-10-06 LAB — COMPREHENSIVE METABOLIC PANEL
ALBUMIN: 4.5 g/dL (ref 3.5–5.2)
ALT: 15 U/L (ref 0–35)
AST: 18 U/L (ref 0–37)
Alkaline Phosphatase: 101 U/L (ref 39–117)
BUN: 14 mg/dL (ref 6–23)
CALCIUM: 9.7 mg/dL (ref 8.4–10.5)
CHLORIDE: 101 meq/L (ref 96–112)
CO2: 30 meq/L (ref 19–32)
Creatinine, Ser: 0.7 mg/dL (ref 0.4–1.2)
GFR: 107.16 mL/min (ref >60.00)
GLUCOSE: 107 mg/dL — AB (ref 70–99)
POTASSIUM: 3.5 meq/L (ref 3.5–5.1)
SODIUM: 140 meq/L (ref 135–145)
TOTAL PROTEIN: 8.4 g/dL — AB (ref 6.0–8.3)
Total Bilirubin: 0.8 mg/dL (ref 0.2–1.2)

## 2013-10-06 LAB — LIPID PANEL
CHOLESTEROL: 239 mg/dL — AB (ref 0–200)
HDL: 48.5 mg/dL (ref >39.00)
LDL Cholesterol: 176 mg/dL — ABNORMAL HIGH (ref 0–99)
Total CHOL/HDL Ratio: 5
Triglycerides: 74 mg/dL (ref 0.0–149.0)
VLDL: 14.8 mg/dL (ref 0.0–40.0)

## 2013-10-06 LAB — TSH: TSH: 0.41 u[IU]/mL (ref 0.35–4.50)

## 2013-10-06 MED ORDER — IBUPROFEN 800 MG PO TABS: 800.0000 mg | ORAL_TABLET | Freq: Three times a day (TID) | ORAL | Status: DC | PRN

## 2013-10-06 MED ORDER — HYDROCHLOROTHIAZIDE 25 MG PO TABS: ORAL_TABLET | ORAL | Status: DC

## 2013-10-06 MED ORDER — METOPROLOL SUCCINATE ER 25 MG PO TB24: 25.0000 mg | ORAL_TABLET | Freq: Two times a day (BID) | ORAL | Status: DC

## 2013-10-06 MED ORDER — AMLODIPINE BESYLATE 5 MG PO TABS: 5.0000 mg | ORAL_TABLET | Freq: Every day | ORAL | Status: DC

## 2013-10-06 NOTE — Assessment & Plan Note (Signed)
Will continue nsaids as needed

## 2013-10-06 NOTE — Assessment & Plan Note (Signed)
This appears to be stable

## 2013-10-06 NOTE — Progress Notes (Signed)
Subjective:    Patient ID: Julie Clark, female    DOB: 10-Nov-1955, 58 y.o.   MRN: [id number]  Arthritis Presents for follow-up visit. The disease course has been fluctuating. She complains of pain. She reports no stiffness, joint swelling or joint warmth. Affected locations include the right knee and left knee. Her pain is at a severity of 2/10. Pertinent negatives include no diarrhea, dry eyes, dry mouth, dysuria, fatigue, fever, pain at night, pain while resting, rash, Raynaud's syndrome, uveitis or weight loss. Her past medical history is significant for osteoarthritis. Her pertinent risk factors include overuse. Past treatments include NSAIDs. The treatment provided moderate relief. Factors aggravating her arthritis include activity. Compliance with prior treatments has been good.      Review of Systems  Constitutional: Negative.  Negative for fever, chills, weight loss, diaphoresis, appetite change and fatigue.  HENT: Negative.   Eyes: Negative.   Respiratory: Negative.  Negative for cough, choking, chest tightness, shortness of breath, wheezing and stridor.   Cardiovascular: Negative.  Negative for chest pain, palpitations and leg swelling.  Gastrointestinal: Negative.  Negative for nausea, vomiting, abdominal pain, diarrhea, constipation and blood in stool.  Endocrine: Negative.   Genitourinary: Negative.  Negative for dysuria.  Musculoskeletal: Positive for arthralgias and arthritis. Negative for back pain, gait problem, joint swelling, myalgias, neck pain, neck stiffness and stiffness.  Skin: Negative.  Negative for rash.  Allergic/Immunologic: Negative.   Neurological: Negative.  Negative for dizziness, tremors, seizures, light-headedness, numbness and headaches.  Hematological: Negative.  Negative for adenopathy. Does not bruise/bleed easily.  Psychiatric/Behavioral: Negative.        Objective:   Physical Exam  Vitals reviewed. Constitutional: She is oriented to person,  place, and time. She appears well-developed and well-nourished. No distress.  HENT:  Head: Normocephalic and atraumatic.  Mouth/Throat: Oropharynx is clear and moist. No oropharyngeal exudate.  Eyes: Conjunctivae are normal. Right eye exhibits no discharge. Left eye exhibits no discharge. No scleral icterus.  Neck: Normal range of motion. Neck supple. No JVD present. No tracheal deviation present. No thyromegaly present.  Cardiovascular: Normal rate, regular rhythm, S1 normal, S2 normal and intact distal pulses.  Exam reveals no gallop, no S3, no S4 and no friction rub.   Murmur heard.  Decrescendo systolic murmur is present with a grade of 2/6   No diastolic murmur is present  Pulses:      Carotid pulses are 1+ on the right side, and 1+ on the left side.      Radial pulses are 1+ on the right side, and 1+ on the left side.       Femoral pulses are 1+ on the right side, and 1+ on the left side.      Popliteal pulses are 1+ on the right side, and 1+ on the left side.       Dorsalis pedis pulses are 1+ on the right side, and 1+ on the left side.       Posterior tibial pulses are 1+ on the right side, and 1+ on the left side.  Pulmonary/Chest: Effort normal and breath sounds normal. No stridor. No respiratory distress. She has no wheezes. She has no rales. She exhibits no tenderness.  Abdominal: Soft. Bowel sounds are normal. She exhibits no distension and no mass. There is no tenderness. There is no rebound and no guarding. Hernia confirmed negative in the right inguinal area and confirmed negative in the left inguinal area.  Genitourinary: Rectum normal, vagina normal and uterus  normal. Rectal exam shows no external hemorrhoid, no internal hemorrhoid, no fissure, no mass, no tenderness and anal tone normal. Guaiac negative stool. No breast swelling, tenderness, discharge or bleeding. Pelvic exam was performed with patient supine. No labial fusion. There is no rash, tenderness, lesion or injury on  the right labia. There is no rash, tenderness, lesion or injury on the left labia. Uterus is not deviated, not enlarged, not fixed and not tender. Cervix exhibits no motion tenderness, no discharge and no friability. Right adnexum displays no mass, no tenderness and no fullness. Left adnexum displays no mass, no tenderness and no fullness. No erythema, tenderness or bleeding around the vagina. No foreign body around the vagina. No signs of injury around the vagina. No vaginal discharge found.  Musculoskeletal: Normal range of motion. She exhibits no edema and no tenderness.       Right knee: She exhibits deformity (mild crepitance). She exhibits normal range of motion, no swelling, no effusion, no ecchymosis, no laceration, no erythema, normal alignment, no LCL laxity, normal patellar mobility and no bony tenderness. No tenderness found.  Lymphadenopathy:    She has no cervical adenopathy.       Right: No inguinal adenopathy present.       Left: No inguinal adenopathy present.  Neurological: She is oriented to person, place, and time.  Skin: Skin is warm and dry. No rash noted. She is not diaphoretic. No erythema. No pallor.  Psychiatric: She has a normal mood and affect. Her behavior is normal. Judgment and thought content normal.     Lab Results  Component Value Date   WBC 12.7* 08/11/2012   HGB 12.6 08/11/2012   HCT 37.5 08/11/2012   PLT 335.0 08/11/2012   GLUCOSE 96 10/01/2012   CHOL 157 08/11/2012   TRIG 76.0 08/11/2012   HDL 44.00 08/11/2012   LDLCALC 98 08/11/2012   ALT 14 10/01/2012   AST 16 10/01/2012   NA 136 10/01/2012   K 3.7 10/01/2012   CL 100 10/01/2012   CREATININE 0.9 10/01/2012   BUN 13 10/01/2012   CO2 27 10/01/2012   TSH 1.11 08/11/2012        Assessment & Plan:

## 2013-10-06 NOTE — Patient Instructions (Signed)
Preventive Care for Adults, Female A healthy lifestyle and preventive care can promote health and wellness. Preventive health guidelines for women include the following key practices.  A routine yearly physical is a good way to check with your health care provider about your health and preventive screening. It is a chance to share any concerns and updates on your health and to receive a thorough exam.  Visit your dentist for a routine exam and preventive care every 6 months. Brush your teeth twice a day and floss once a day. Good oral hygiene prevents tooth decay and gum disease.  The frequency of eye exams is based on your age, health, family medical history, use of contact lenses, and other factors. Follow your health care provider's recommendations for frequency of eye exams.  Eat a healthy diet. Foods like vegetables, fruits, whole grains, low-fat dairy products, and lean protein foods contain the nutrients you need without too many calories. Decrease your intake of foods high in solid fats, added sugars, and salt. Eat the right amount of calories for you.Get information about a proper diet from your health care provider, if necessary.  Regular physical exercise is one of the most important things you can do for your health. Most adults should get at least 150 minutes of moderate-intensity exercise (any activity that increases your heart rate and causes you to sweat) each week. In addition, most adults need muscle-strengthening exercises on 2 or more days a week.  Maintain a healthy weight. The body mass index (BMI) is a screening tool to identify possible weight problems. It provides an estimate of body fat based on height and weight. Your health care provider can find your BMI, and can help you achieve or maintain a healthy weight.For adults 20 years and older:  A BMI below 18.5 is considered underweight.  A BMI of 18.5 to 24.9 is normal.  A BMI of 25 to 29.9 is considered overweight.  A  BMI of 30 and above is considered obese.  Maintain normal blood lipids and cholesterol levels by exercising and minimizing your intake of saturated fat. Eat a balanced diet with plenty of fruit and vegetables. Blood tests for lipids and cholesterol should begin at age 62 and be repeated every 5 years. If your lipid or cholesterol levels are high, you are over 50, or you are at high risk for heart disease, you may need your cholesterol levels checked more frequently.Ongoing high lipid and cholesterol levels should be treated with medicines if diet and exercise are not working.  If you smoke, find out from your health care provider how to quit. If you do not use tobacco, do not start.  Lung cancer screening is recommended for adults aged 36 80 years who are at high risk for developing lung cancer because of a history of smoking. A yearly low-dose CT scan of the lungs is recommended for people who have at least a 30-pack-year history of smoking and are a current smoker or have quit within the past 15 years. A pack year of smoking is smoking an average of 1 pack of cigarettes a day for 1 year (for example: 1 pack a day for 30 years or 2 packs a day for 15 years). Yearly screening should continue until the smoker has stopped smoking for at least 15 years. Yearly screening should be stopped for people who develop a health problem that would prevent them from having lung cancer treatment.  If you are pregnant, do not drink alcohol. If you  are breastfeeding, be very cautious about drinking alcohol. If you are not pregnant and choose to drink alcohol, do not have more than 1 drink per day. One drink is considered to be 12 ounces (355 mL) of beer, 5 ounces (148 mL) of wine, or 1.5 ounces (44 mL) of liquor.  Avoid use of street drugs. Do not share needles with anyone. Ask for help if you need support or instructions about stopping the use of drugs.  High blood pressure causes heart disease and increases the risk  of stroke. Your blood pressure should be checked at least every 1 to 2 years. Ongoing high blood pressure should be treated with medicines if weight loss and exercise do not work.  If you are 39 58 years old, ask your health care provider if you should take aspirin to prevent strokes.  Diabetes screening involves taking a blood sample to check your fasting blood sugar level. This should be done once every 3 years, after age 28, if you are within normal weight and without risk factors for diabetes. Testing should be considered at a younger age or be carried out more frequently if you are overweight and have at least 1 risk factor for diabetes.  Breast cancer screening is essential preventive care for women. You should practice "breast self-awareness." This means understanding the normal appearance and feel of your breasts and may include breast self-examination. Any changes detected, no matter how small, should be reported to a health care provider. Women in their 51s and 30s should have a clinical breast exam (CBE) by a health care provider as part of a regular health exam every 1 to 3 years. After age 77, women should have a CBE every year. Starting at age 10, women should consider having a mammogram (breast X-ray test) every year. Women who have a family history of breast cancer should talk to their health care provider about genetic screening. Women at a high risk of breast cancer should talk to their health care providers about having an MRI and a mammogram every year.  Breast cancer gene (BRCA)-related cancer risk assessment is recommended for women who have family members with BRCA-related cancers. BRCA-related cancers include breast, ovarian, tubal, and peritoneal cancers. Having family members with these cancers may be associated with an increased risk for harmful changes (mutations) in the breast cancer genes BRCA1 and BRCA2. Results of the assessment will determine the need for genetic counseling  and BRCA1 and BRCA2 testing.  The Pap test is a screening test for cervical cancer. A Pap test can show cell changes on the cervix that might become cervical cancer if left untreated. A Pap test is a procedure in which cells are obtained and examined from the lower end of the uterus (cervix).  Women should have a Pap test starting at age 32.  Between ages 77 and 47, Pap tests should be repeated every 2 years.  Beginning at age 28, you should have a Pap test every 3 years as long as the past 3 Pap tests have been normal.  Some women have medical problems that increase the chance of getting cervical cancer. Talk to your health care provider about these problems. It is especially important to talk to your health care provider if a new problem develops soon after your last Pap test. In these cases, your health care provider may recommend more frequent screening and Pap tests.  The above recommendations are the same for women who have or have not gotten the vaccine  for human papillomavirus (HPV).  If you had a hysterectomy for a problem that was not cancer or a condition that could lead to cancer, then you no longer need Pap tests. Even if you no longer need a Pap test, a regular exam is a good idea to make sure no other problems are starting.  If you are between ages 58 and 10 years, and you have had normal Pap tests going back 10 years, you no longer need Pap tests. Even if you no longer need a Pap test, a regular exam is a good idea to make sure no other problems are starting.  If you have had past treatment for cervical cancer or a condition that could lead to cancer, you need Pap tests and screening for cancer for at least 20 years after your treatment.  If Pap tests have been discontinued, risk factors (such as a new sexual partner) need to be reassessed to determine if screening should be resumed.  The HPV test is an additional test that may be used for cervical cancer screening. The HPV test  looks for the virus that can cause the cell changes on the cervix. The cells collected during the Pap test can be tested for HPV. The HPV test could be used to screen women aged 67 years and older, and should be used in women of any age who have unclear Pap test results. After the age of 65, women should have HPV testing at the same frequency as a Pap test.  Colorectal cancer can be detected and often prevented. Most routine colorectal cancer screening begins at the age of 25 years and continues through age 66 years. However, your health care provider may recommend screening at an earlier age if you have risk factors for colon cancer. On a yearly basis, your health care provider may provide home test kits to check for hidden blood in the stool. Use of a small camera at the end of a tube, to directly examine the colon (sigmoidoscopy or colonoscopy), can detect the earliest forms of colorectal cancer. Talk to your health care provider about this at age 79, when routine screening begins. Direct exam of the colon should be repeated every 5 10 years through age 47 years, unless early forms of pre-cancerous polyps or small growths are found.  People who are at an increased risk for hepatitis B should be screened for this virus. You are considered at high risk for hepatitis B if:  You were born in a country where hepatitis B occurs often. Talk with your health care provider about which countries are considered high risk.  Your parents were born in a high-risk country and you have not received a shot to protect against hepatitis B (hepatitis B vaccine).  You have HIV or AIDS.  You use needles to inject street drugs.  You live with, or have sex with, someone who has Hepatitis B.  You get hemodialysis treatment.  You take certain medicines for conditions like cancer, organ transplantation, and autoimmune conditions.  Hepatitis C blood testing is recommended for all people born from [birth year] through 1965 and  any individual with known risks for hepatitis C.  Practice safe sex. Use condoms and avoid high-risk sexual practices to reduce the spread of sexually transmitted infections (STIs). STIs include gonorrhea, chlamydia, syphilis, trichomonas, herpes, HPV, and human immunodeficiency virus (HIV). Herpes, HIV, and HPV are viral illnesses that have no cure. They can result in disability, cancer, and death. Sexually active women aged 66  years and younger should be checked for chlamydia. Older women with new or multiple partners should also be tested for chlamydia. Testing for other STIs is recommended if you are sexually active and at increased risk.  Osteoporosis is a disease in which the bones lose minerals and strength with aging. This can result in serious bone fractures or breaks. The risk of osteoporosis can be identified using a bone density scan. Women ages 18 years and over and women at risk for fractures or osteoporosis should discuss screening with their health care providers. Ask your health care provider whether you should take a calcium supplement or vitamin D to reduce the rate of osteoporosis.  Menopause can be associated with physical symptoms and risks. Hormone replacement therapy is available to decrease symptoms and risks. You should talk to your health care provider about whether hormone replacement therapy is right for you.  Use sunscreen. Apply sunscreen liberally and repeatedly throughout the day. You should seek shade when your shadow is shorter than you. Protect yourself by wearing long sleeves, pants, a wide-brimmed hat, and sunglasses year round, whenever you are outdoors.  Once a month, do a whole body skin exam, using a mirror to look at the skin on your back. Tell your health care provider of new moles, moles that have irregular borders, moles that are larger than a pencil eraser, or moles that have changed in shape or color.  Stay current with required vaccines  (immunizations).  Influenza vaccine. All adults should be immunized every year.  Tetanus, diphtheria, and acellular pertussis (Td, Tdap) vaccine. Pregnant women should receive 1 dose of Tdap vaccine during each pregnancy. The dose should be obtained regardless of the length of time since the last dose. Immunization is preferred during the 27th 36th week of gestation. An adult who has not previously received Tdap or who does not know her vaccine status should receive 1 dose of Tdap. This initial dose should be followed by tetanus and diphtheria toxoids (Td) booster doses every 10 years. Adults with an unknown or incomplete history of completing a 3-dose immunization series with Td-containing vaccines should begin or complete a primary immunization series including a Tdap dose. Adults should receive a Td booster every 10 years.  Varicella vaccine. An adult without evidence of immunity to varicella should receive 2 doses or a second dose if she has previously received 1 dose. Pregnant females who do not have evidence of immunity should receive the first dose after pregnancy. This first dose should be obtained before leaving the health care facility. The second dose should be obtained 4 8 weeks after the first dose.  Human papillomavirus (HPV) vaccine. Females aged 9 26 years who have not received the vaccine previously should obtain the 3-dose series. The vaccine is not recommended for use in pregnant females. However, pregnancy testing is not needed before receiving a dose. If a female is found to be pregnant after receiving a dose, no treatment is needed. In that case, the remaining doses should be delayed until after the pregnancy. Immunization is recommended for any person with an immunocompromised condition through the age of 51 years if she did not get any or all doses earlier. During the 3-dose series, the second dose should be obtained 4 8 weeks after the first dose. The third dose should be obtained  24 weeks after the first dose and 16 weeks after the second dose.  Zoster vaccine. One dose is recommended for adults aged 57 years or older unless certain  conditions are present.  Measles, mumps, and rubella (MMR) vaccine. Adults born before [birth year] generally are considered immune to measles and mumps. Adults born in [birth year] or later should have 1 or more doses of MMR vaccine unless there is a contraindication to the vaccine or there is laboratory evidence of immunity to each of the three diseases. A routine second dose of MMR vaccine should be obtained at least 28 days after the first dose for students attending postsecondary schools, health care workers, or international travelers. People who received inactivated measles vaccine or an unknown type of measles vaccine during 1963 1967 should receive 2 doses of MMR vaccine. People who received inactivated mumps vaccine or an unknown type of mumps vaccine before 1979 and are at high risk for mumps infection should consider immunization with 2 doses of MMR vaccine. For females of childbearing age, rubella immunity should be determined. If there is no evidence of immunity, females who are not pregnant should be vaccinated. If there is no evidence of immunity, females who are pregnant should delay immunization until after pregnancy. Unvaccinated health care workers born before [birth year] who lack laboratory evidence of measles, mumps, or rubella immunity or laboratory confirmation of disease should consider measles and mumps immunization with 2 doses of MMR vaccine or rubella immunization with 1 dose of MMR vaccine.  Pneumococcal 13-valent conjugate (PCV13) vaccine. When indicated, a person who is uncertain of her immunization history and has no record of immunization should receive the PCV13 vaccine. An adult aged 42 years or older who has certain medical conditions and has not been previously immunized should receive 1 dose of PCV13 vaccine. This PCV13 should be followed  with a dose of pneumococcal polysaccharide (PPSV23) vaccine. The PPSV23 vaccine dose should be obtained at least 8 weeks after the dose of PCV13 vaccine. An adult aged 4 years or older who has certain medical conditions and previously received 1 or more doses of PPSV23 vaccine should receive 1 dose of PCV13. The PCV13 vaccine dose should be obtained 1 or more years after the last PPSV23 vaccine dose.  Pneumococcal polysaccharide (PPSV23) vaccine. When PCV13 is also indicated, PCV13 should be obtained first. All adults aged 27 years and older should be immunized. An adult younger than age 33 years who has certain medical conditions should be immunized. Any person who resides in a nursing home or long-term care facility should be immunized. An adult smoker should be immunized. People with an immunocompromised condition and certain other conditions should receive both PCV13 and PPSV23 vaccines. People with human immunodeficiency virus (HIV) infection should be immunized as soon as possible after diagnosis. Immunization during chemotherapy or radiation therapy should be avoided. Routine use of PPSV23 vaccine is not recommended for American Indians, [place] Natives, or people younger than 65 years unless there are medical conditions that require PPSV23 vaccine. When indicated, people who have unknown immunization and have no record of immunization should receive PPSV23 vaccine. One-time revaccination 5 years after the first dose of PPSV23 is recommended for people aged 13 64 years who have chronic kidney failure, nephrotic syndrome, asplenia, or immunocompromised conditions. People who received 1 2 doses of PPSV23 before age 66 years should receive another dose of PPSV23 vaccine at age 27 years or later if at least 5 years have passed since the previous dose. Doses of PPSV23 are not needed for people immunized with PPSV23 at or after age 33 years.  Meningococcal vaccine. Adults with asplenia or persistent complement  component deficiencies should receive 2  doses of quadrivalent meningococcal conjugate (MenACWY-D) vaccine. The doses should be obtained at least 2 months apart. Microbiologists working with certain meningococcal bacteria, [employer] recruits, people at risk during an outbreak, and people who travel to or live in countries with a high rate of meningitis should be immunized. A first-year college student up through age 21 years who is living in a residence hall should receive a dose if she did not receive a dose on or after her 16th birthday. Adults who have certain high-risk conditions should receive one or more doses of vaccine.  Hepatitis A vaccine. Adults who wish to be protected from this disease, have certain high-risk conditions, work with hepatitis A-infected animals, work in hepatitis A research labs, or travel to or work in countries with a high rate of hepatitis A should be immunized. Adults who were previously unvaccinated and who anticipate close contact with an international adoptee during the first 60 days after arrival in the [place] from a country with a high rate of hepatitis A should be immunized.  Hepatitis B vaccine. Adults who wish to be protected from this disease, have certain high-risk conditions, may be exposed to blood or other infectious body fluids, are household contacts or sex partners of hepatitis B positive people, are clients or workers in certain care facilities, or travel to or work in countries with a high rate of hepatitis B should be immunized.  Haemophilus influenzae type b (Hib) vaccine. A previously unvaccinated person with asplenia or sickle cell disease or having a scheduled splenectomy should receive 1 dose of Hib vaccine. Regardless of previous immunization, a recipient of a hematopoietic stem cell transplant should receive a 3-dose series 6 12 months after her successful transplant. Hib vaccine is not recommended for adults with HIV infection. Preventive  Services / Frequency Ages 83 to 39years  Blood pressure check.** / Every 1 to 2 years.  Lipid and cholesterol check.** / Every 5 years beginning at age 52.  Clinical breast exam.** / Every 3 years for women in their 3s and 55s.  BRCA-related cancer risk assessment.** / For women who have family members with a BRCA-related cancer (breast, ovarian, tubal, or peritoneal cancers).  Pap test.** / Every 2 years from ages 4 through 4. Every 3 years starting at age 93 through age 35 or 87 with a history of 3 consecutive normal Pap tests.  HPV screening.** / Every 3 years from ages 85 through ages 38 to 60 with a history of 3 consecutive normal Pap tests.  Hepatitis C blood test.** / For any individual with known risks for hepatitis C.  Skin self-exam. / Monthly.  Influenza vaccine. / Every year.  Tetanus, diphtheria, and acellular pertussis (Tdap, Td) vaccine.** / Consult your health care provider. Pregnant women should receive 1 dose of Tdap vaccine during each pregnancy. 1 dose of Td every 10 years.  Varicella vaccine.** / Consult your health care provider. Pregnant females who do not have evidence of immunity should receive the first dose after pregnancy.  HPV vaccine. / 3 doses over 6 months, if 46 and younger. The vaccine is not recommended for use in pregnant females. However, pregnancy testing is not needed before receiving a dose.  Measles, mumps, rubella (MMR) vaccine.** / You need at least 1 dose of MMR if you were born in 1957 or later. You may also need a 2nd dose. For females of childbearing age, rubella immunity should be determined. If there is no evidence of immunity, females who are not  pregnant should be vaccinated. If there is no evidence of immunity, females who are pregnant should delay immunization until after pregnancy.  Pneumococcal 13-valent conjugate (PCV13) vaccine.** / Consult your health care provider.  Pneumococcal polysaccharide (PPSV23) vaccine.** / 1 to 2  doses if you smoke cigarettes or if you have certain conditions.  Meningococcal vaccine.** / 1 dose if you are age 56 to 1 years and a Market researcher living in a residence hall, or have one of several medical conditions, you need to get vaccinated against meningococcal disease. You may also need additional booster doses.  Hepatitis A vaccine.** / Consult your health care provider.  Hepatitis B vaccine.** / Consult your health care provider.  Haemophilus influenzae type b (Hib) vaccine.** / Consult your health care provider. Ages 62 to 64years  Blood pressure check.** / Every 1 to 2 years.  Lipid and cholesterol check.** / Every 5 years beginning at age 41 years.  Lung cancer screening. / Every year if you are aged 70 80 years and have a 30-pack-year history of smoking and currently smoke or have quit within the past 15 years. Yearly screening is stopped once you have quit smoking for at least 15 years or develop a health problem that would prevent you from having lung cancer treatment.  Clinical breast exam.** / Every year after age 66 years.  BRCA-related cancer risk assessment.** / For women who have family members with a BRCA-related cancer (breast, ovarian, tubal, or peritoneal cancers).  Mammogram.** / Every year beginning at age 65 years and continuing for as long as you are in good health. Consult with your health care provider.  Pap test.** / Every 3 years starting at age 50 years through age 59 or 75 years with a history of 3 consecutive normal Pap tests.  HPV screening.** / Every 3 years from ages 41 years through ages 62 to 38 years with a history of 3 consecutive normal Pap tests.  Fecal occult blood test (FOBT) of stool. / Every year beginning at age 65 years and continuing until age 25 years. You may not need to do this test if you get a colonoscopy every 10 years.  Flexible sigmoidoscopy or colonoscopy.** / Every 5 years for a flexible sigmoidoscopy or every  10 years for a colonoscopy beginning at age 60 years and continuing until age 78 years.  Hepatitis C blood test.** / For all people born from [birth year] through 1965 and any individual with known risks for hepatitis C.  Skin self-exam. / Monthly.  Influenza vaccine. / Every year.  Tetanus, diphtheria, and acellular pertussis (Tdap/Td) vaccine.** / Consult your health care provider. Pregnant women should receive 1 dose of Tdap vaccine during each pregnancy. 1 dose of Td every 10 years.  Varicella vaccine.** / Consult your health care provider. Pregnant females who do not have evidence of immunity should receive the first dose after pregnancy.  Zoster vaccine.** / 1 dose for adults aged 75 years or older.  Measles, mumps, rubella (MMR) vaccine.** / You need at least 1 dose of MMR if you were born in 1957 or later. You may also need a 2nd dose. For females of childbearing age, rubella immunity should be determined. If there is no evidence of immunity, females who are not pregnant should be vaccinated. If there is no evidence of immunity, females who are pregnant should delay immunization until after pregnancy.  Pneumococcal 13-valent conjugate (PCV13) vaccine.** / Consult your health care provider.  Pneumococcal polysaccharide (PPSV23) vaccine.** / 1  to 2 doses if you smoke cigarettes or if you have certain conditions.  Meningococcal vaccine.** / Consult your health care provider.  Hepatitis A vaccine.** / Consult your health care provider.  Hepatitis B vaccine.** / Consult your health care provider.  Haemophilus influenzae type b (Hib) vaccine.** / Consult your health care provider. Ages 66 years and over  Blood pressure check.** / Every 1 to 2 years.  Lipid and cholesterol check.** / Every 5 years beginning at age 39 years.  Lung cancer screening. / Every year if you are aged 25 80 years and have a 30-pack-year history of smoking and currently smoke or have quit within the past 15 years.  Yearly screening is stopped once you have quit smoking for at least 15 years or develop a health problem that would prevent you from having lung cancer treatment.  Clinical breast exam.** / Every year after age 30 years.  BRCA-related cancer risk assessment.** / For women who have family members with a BRCA-related cancer (breast, ovarian, tubal, or peritoneal cancers).  Mammogram.** / Every year beginning at age 31 years and continuing for as long as you are in good health. Consult with your health care provider.  Pap test.** / Every 3 years starting at age 37 years through age 74 or 30 years with 3 consecutive normal Pap tests. Testing can be stopped between 65 and 70 years with 3 consecutive normal Pap tests and no abnormal Pap or HPV tests in the past 10 years.  HPV screening.** / Every 3 years from ages 63 years through ages 41 or 36 years with a history of 3 consecutive normal Pap tests. Testing can be stopped between 65 and 70 years with 3 consecutive normal Pap tests and no abnormal Pap or HPV tests in the past 10 years.  Fecal occult blood test (FOBT) of stool. / Every year beginning at age 72 years and continuing until age 38 years. You may not need to do this test if you get a colonoscopy every 10 years.  Flexible sigmoidoscopy or colonoscopy.** / Every 5 years for a flexible sigmoidoscopy or every 10 years for a colonoscopy beginning at age 55 years and continuing until age 89 years.  Hepatitis C blood test.** / For all people born from [birth year] through 1965 and any individual with known risks for hepatitis C.  Osteoporosis screening.** / A one-time screening for women ages 11 years and over and women at risk for fractures or osteoporosis.  Skin self-exam. / Monthly.  Influenza vaccine. / Every year.  Tetanus, diphtheria, and acellular pertussis (Tdap/Td) vaccine.** / 1 dose of Td every 10 years.  Varicella vaccine.** / Consult your health care provider.  Zoster vaccine.** / 1  dose for adults aged 29 years or older.  Pneumococcal 13-valent conjugate (PCV13) vaccine.** / Consult your health care provider.  Pneumococcal polysaccharide (PPSV23) vaccine.** / 1 dose for all adults aged 81 years and older.  Meningococcal vaccine.** / Consult your health care provider.  Hepatitis A vaccine.** / Consult your health care provider.  Hepatitis B vaccine.** / Consult your health care provider.  Haemophilus influenzae type b (Hib) vaccine.** / Consult your health care provider. ** Family history and personal history of risk and conditions may change your health care provider's recommendations. Document Released: 07/10/2001 Document Revised: 03/04/2013 Document Reviewed: 10/09/2010 [facility] Patient Information 2014 [place], [place]. Hypertension As your heart beats, it forces blood through your arteries. This force is your blood pressure. If the pressure is too high, it is  called hypertension (HTN) or high blood pressure. HTN is dangerous because you may have it and not know it. High blood pressure may mean that your heart has to work harder to pump blood. Your arteries may be narrow or stiff. The extra work puts you at risk for heart disease, stroke, and other problems.  Blood pressure consists of two numbers, a higher number over a lower, 110/72, for example. It is stated as "110 over 72." The ideal is below 120 for the top number (systolic) and under 80 for the bottom (diastolic). Write down your blood pressure today. You should pay close attention to your blood pressure if you have certain conditions such as:  Heart failure.  Prior heart attack.  Diabetes  Chronic kidney disease.  Prior stroke.  Multiple risk factors for heart disease. To see if you have HTN, your blood pressure should be measured while you are seated with your arm held at the level of the heart. It should be measured at least twice. A one-time elevated blood pressure reading (especially in the  Emergency Department) does not mean that you need treatment. There may be conditions in which the blood pressure is different between your right and left arms. It is important to see your caregiver soon for a recheck. Most people have essential hypertension which means that there is not a specific cause. This type of high blood pressure may be lowered by changing lifestyle factors such as:  Stress.  Smoking.  Lack of exercise.  Excessive weight.  Drug/tobacco/alcohol use.  Eating less salt. Most people do not have symptoms from high blood pressure until it has caused damage to the body. Effective treatment can often prevent, delay or reduce that damage. TREATMENT  When a cause has been identified, treatment for high blood pressure is directed at the cause. There are a large number of medications to treat HTN. These fall into several categories, and your caregiver will help you select the medicines that are best for you. Medications may have side effects. You should review side effects with your caregiver. If your blood pressure stays high after you have made lifestyle changes or started on medicines,   Your medication(s) may need to be changed.  Other problems may need to be addressed.  Be certain you understand your prescriptions, and know how and when to take your medicine.  Be sure to follow up with your caregiver within the time frame advised (usually within two weeks) to have your blood pressure rechecked and to review your medications.  If you are taking more than one medicine to lower your blood pressure, make sure you know how and at what times they should be taken. Taking two medicines at the same time can result in blood pressure that is too low. SEEK IMMEDIATE MEDICAL CARE IF:  You develop a severe headache, blurred or changing vision, or confusion.  You have unusual weakness or numbness, or a faint feeling.  You have severe chest or abdominal pain, vomiting, or breathing  problems. MAKE SURE YOU:   Understand these instructions.  Will watch your condition.  Will get help right away if you are not doing well or get worse. Document Released: 05/14/2005 Document Revised: 08/06/2011 Document Reviewed: 01/02/2008 [facility] Patient Information 2014 [place].

## 2013-10-06 NOTE — Assessment & Plan Note (Signed)
Her BP is well controlled 

## 2013-10-06 NOTE — Assessment & Plan Note (Signed)
Exam done, PAP sent Vaccines were reviewed Labs ordered Pt ed material was given

## 2013-10-06 NOTE — Progress Notes (Signed)
Pre visit review using our clinic review tool, if applicable. No additional management support is needed unless otherwise documented below in the visit note. 

## 2013-10-09 ENCOUNTER — Encounter: Payer: Self-pay | Admitting: Internal Medicine

## 2013-10-09 LAB — HM PAP SMEAR: HM PAP: NORMAL

## 2014-02-16 ENCOUNTER — Ambulatory Visit (INDEPENDENT_AMBULATORY_CARE_PROVIDER_SITE_OTHER): Payer: 59 | Admitting: Internal Medicine

## 2014-02-16 ENCOUNTER — Encounter: Payer: Self-pay | Admitting: Internal Medicine

## 2014-02-16 VITALS — BP 128/68 | HR 81 | Temp 98.2°F | Resp 16 | Ht 65.0 in | Wt 185.0 lb

## 2014-02-16 DIAGNOSIS — L03317 Cellulitis of buttock: Secondary | ICD-10-CM

## 2014-02-16 DIAGNOSIS — L0231 Cutaneous abscess of buttock: Secondary | ICD-10-CM

## 2014-02-16 DIAGNOSIS — L732 Hidradenitis suppurativa: Secondary | ICD-10-CM | POA: Insufficient documentation

## 2014-02-16 MED ORDER — SULFAMETHOXAZOLE-TRIMETHOPRIM 800-160 MG PO TABS: 1.0000 | ORAL_TABLET | Freq: Two times a day (BID) | ORAL | Status: DC

## 2014-02-16 NOTE — Patient Instructions (Signed)
Incision and Drainage Incision and drainage is a procedure in which a sac-like structure (cystic structure) is opened and drained. The area to be drained usually contains material such as pus, fluid, or blood.  LET YOUR CAREGIVER KNOW ABOUT:   Allergies to medicine.  Medicines taken, including vitamins, herbs, eyedrops, over-the-counter medicines, and creams.  Use of steroids (by mouth or creams).  Previous problems with anesthetics or numbing medicines.  History of bleeding problems or blood clots.  Previous surgery.  Other health problems, including diabetes and kidney problems.  Possibility of pregnancy, if this applies. RISKS AND COMPLICATIONS  Pain.  Bleeding.  Scarring.  Infection. BEFORE THE PROCEDURE  You may need to have an ultrasound or other imaging tests to see how large or deep your cystic structure is. Blood tests may also be used to determine if you have an infection or how severe the infection is. You may need to have a tetanus shot. PROCEDURE  The affected area is cleaned with a cleaning fluid. The cyst area will then be numbed with a medicine (local anesthetic). A small incision will be made in the cystic structure. A syringe or catheter may be used to drain the contents of the cystic structure, or the contents may be squeezed out. The area will then be flushed with a cleansing solution. After cleansing the area, it is often gently packed with a gauze or another wound dressing. Once it is packed, it will be covered with gauze and tape or some other type of wound dressing. AFTER THE PROCEDURE   Often, you will be allowed to go home right after the procedure.  You may be given antibiotic medicine to prevent or heal an infection.  If the area was packed with gauze or some other wound dressing, you will likely need to come back in 1 to 2 days to get it removed.  The area should heal in about 14 days. Document Released: 11/07/2000 Document Revised: 11/13/2011  Document Reviewed: 07/09/2011 [employer] Patient Information 2015 [employer]. This information is not intended to replace advice given to you by your health care provider. Make sure you discuss any questions you have with your health care provider.  

## 2014-02-16 NOTE — Progress Notes (Signed)
Pre visit review using our clinic review tool, if applicable. No additional management support is needed unless otherwise documented below in the visit note. 

## 2014-02-17 NOTE — Progress Notes (Signed)
   Subjective:    Patient ID: Julie Clark, female    DOB: 07-18-1955, 58 y.o.   MRN: [id number]  Abscess This is a new problem. The current episode started in the past 7 days. The problem occurs constantly. The problem has been rapidly worsening. Pertinent negatives include no abdominal pain, anorexia, arthralgias, change in bowel habit, chest pain, chills, congestion, coughing, diaphoresis, fatigue, fever, headaches, joint swelling, myalgias, nausea, neck pain, numbness, rash, sore throat, swollen glands, urinary symptoms, vertigo, visual change, vomiting or weakness. She has tried NSAIDs for the symptoms. The treatment provided moderate relief.      Review of Systems  Constitutional: Negative.  Negative for fever, chills, diaphoresis and fatigue.  HENT: Negative.  Negative for congestion and sore throat.   Eyes: Negative.   Respiratory: Negative.  Negative for cough.   Cardiovascular: Negative.  Negative for chest pain, palpitations and leg swelling.  Gastrointestinal: Negative.  Negative for nausea, vomiting, abdominal pain, anorexia and change in bowel habit.  Endocrine: Negative.   Genitourinary: Negative.   Musculoskeletal: Negative.  Negative for arthralgias, joint swelling, myalgias and neck pain.  Skin: Negative for rash.  Allergic/Immunologic: Negative.   Neurological: Negative.  Negative for vertigo, weakness, numbness and headaches.  Hematological: Negative.  Negative for adenopathy. Does not bruise/bleed easily.  Psychiatric/Behavioral: Negative.        Objective:   Physical Exam  Vitals reviewed. Constitutional: She is oriented to person, place, and time. She appears well-developed and well-nourished.  Non-toxic appearance. She does not have a sickly appearance. She does not appear ill. No distress.  HENT:  Head: Normocephalic and atraumatic.  Mouth/Throat: Oropharynx is clear and moist. No oropharyngeal exudate.  Eyes: Conjunctivae are normal. Right eye exhibits no  discharge. Left eye exhibits no discharge. No scleral icterus.  Neck: Normal range of motion. Neck supple. No JVD present. No tracheal deviation present. No thyromegaly present.  Cardiovascular: Normal rate, regular rhythm, normal heart sounds and intact distal pulses.  Exam reveals no gallop and no friction rub.   No murmur heard. Pulmonary/Chest: Effort normal and breath sounds normal. No stridor. No respiratory distress. She has no wheezes. She has no rales. She exhibits no tenderness.  Abdominal: Soft. Bowel sounds are normal. She exhibits no distension and no mass. There is no tenderness. There is no rebound and no guarding.  Musculoskeletal: Normal range of motion. She exhibits no edema and no tenderness.       Back:  The area was cleaned with betadine then local anesthesia was obtained with 2.5 cc of lidocaine with epi. Next a 4 mm punch incision was made and a copious amount of thick/purulent exudate was expressed. The cavity was explored and several loculations were found and disrupted. The cavity was irrigated with H202 then packed with iodoform. She tolerated the procedure well.  Lymphadenopathy:    She has no cervical adenopathy.  Neurological: She is oriented to person, place, and time.  Skin: Skin is warm and dry. No rash noted. She is not diaphoretic. No erythema. No pallor.          Assessment & Plan:

## 2014-02-17 NOTE — Assessment & Plan Note (Signed)
I and D has been done Culture was sent Will start Bactrim to cover MRSA She will RTC in 2 days for recheck and packing removal

## 2014-02-18 ENCOUNTER — Encounter: Payer: Self-pay | Admitting: Internal Medicine

## 2014-02-18 ENCOUNTER — Ambulatory Visit (INDEPENDENT_AMBULATORY_CARE_PROVIDER_SITE_OTHER): Payer: 59 | Admitting: Internal Medicine

## 2014-02-18 VITALS — BP 122/68 | HR 64 | Temp 97.9°F | Resp 16 | Wt 185.0 lb

## 2014-02-18 DIAGNOSIS — L0231 Cutaneous abscess of buttock: Secondary | ICD-10-CM

## 2014-02-18 DIAGNOSIS — L03317 Cellulitis of buttock: Secondary | ICD-10-CM

## 2014-02-18 NOTE — Progress Notes (Signed)
Pre visit review using our clinic review tool, if applicable. No additional management support is needed unless otherwise documented below in the visit note. 

## 2014-02-18 NOTE — Patient Instructions (Signed)

## 2014-02-18 NOTE — Progress Notes (Signed)
   Subjective:    Patient ID: Julie Clark, female    DOB: 1955/12/29, 58 y.o.   MRN: [id number]  Wound Check She was originally treated 2 to 3 days ago. Previous treatment included I&D of abscess and oral antibiotics. Her temperature was unmeasured prior to arrival. There has been bloody discharge from the wound. The redness has improved. The swelling has improved. The pain has improved. She has no difficulty moving the affected extremity or digit.      Review of Systems  Constitutional: Negative.  Negative for fever, chills, diaphoresis, appetite change and fatigue.  HENT: Negative.   Eyes: Negative.   Respiratory: Negative.  Negative for cough, choking, chest tightness, shortness of breath and stridor.   Cardiovascular: Negative.  Negative for chest pain, palpitations and leg swelling.  Gastrointestinal: Negative for nausea, abdominal pain, diarrhea and constipation.  Endocrine: Negative.   Genitourinary: Negative.   Musculoskeletal: Negative.  Negative for arthralgias, back pain, joint swelling and myalgias.  Skin: Negative.  Negative for rash.  Allergic/Immunologic: Negative.   Neurological: Negative.   Hematological: Negative.  Negative for adenopathy. Does not bruise/bleed easily.  Psychiatric/Behavioral: Negative.        Objective:   Physical Exam  Vitals reviewed. Constitutional: She is oriented to person, place, and time. She appears well-developed and well-nourished.  Non-toxic appearance. She does not have a sickly appearance. She does not appear ill. No distress.  HENT:  Mouth/Throat: Oropharynx is clear and moist. No oropharyngeal exudate.  Eyes: Conjunctivae are normal. Right eye exhibits no discharge. Left eye exhibits no discharge. No scleral icterus.  Neck: Normal range of motion. Neck supple. No JVD present. No tracheal deviation present. No thyromegaly present.  Cardiovascular: Normal rate, regular rhythm, normal heart sounds and intact distal pulses.  Exam  reveals no gallop and no friction rub.   No murmur heard. Pulmonary/Chest: Effort normal and breath sounds normal. No stridor. No respiratory distress. She has no wheezes. She has no rales. She exhibits no tenderness.  Abdominal: Soft. Bowel sounds are normal. She exhibits no distension and no mass. There is no tenderness. There is no rebound and no guarding.  Musculoskeletal: Normal range of motion. She exhibits no edema and no tenderness.       Back:  Lymphadenopathy:    She has no cervical adenopathy.  Neurological: She is oriented to person, place, and time.  Skin: She is not diaphoretic.          Assessment & Plan:

## 2014-02-18 NOTE — Assessment & Plan Note (Signed)
Packing removed The cavity is healing well I await the culture results Will cont bactrim for now

## 2014-02-19 ENCOUNTER — Other Ambulatory Visit: Payer: 59

## 2014-02-19 DIAGNOSIS — L0231 Cutaneous abscess of buttock: Secondary | ICD-10-CM

## 2014-02-21 LAB — WOUND CULTURE
GRAM STAIN: NONE SEEN
Gram Stain: NONE SEEN
Organism ID, Bacteria: NO GROWTH

## 2014-02-25 ENCOUNTER — Ambulatory Visit (HOSPITAL_COMMUNITY)
Admission: RE | Admit: 2014-02-25 | Discharge: 2014-02-25 | Disposition: A | Discharge status: Home / Self Care | Payer: 59 | Source: Ambulatory Visit | Attending: Internal Medicine | Admitting: Internal Medicine

## 2014-02-25 DIAGNOSIS — Z1231 Encounter for screening mammogram for malignant neoplasm of breast: Secondary | ICD-10-CM | POA: Insufficient documentation

## 2014-02-28 LAB — HM MAMMOGRAPHY: HM Mammogram: NORMAL

## 2014-03-01 ENCOUNTER — Telehealth: Payer: Self-pay

## 2014-03-01 MED ORDER — FLUCONAZOLE 150 MG PO TABS: 150.0000 mg | ORAL_TABLET | Freq: Once | ORAL | Status: DC

## 2014-03-01 NOTE — Telephone Encounter (Signed)
done

## 2014-03-01 NOTE — Telephone Encounter (Signed)
Patient c/o yeast infection since completion of recent antibiotics given. She would like to request a Rx sent to [facility] outpatient pharmacy. Thanks

## 2014-03-01 NOTE — Telephone Encounter (Signed)
Pt.notified

## 2014-04-01 ENCOUNTER — Other Ambulatory Visit: Payer: Self-pay | Admitting: *Deleted

## 2014-04-01 DIAGNOSIS — I10 Essential (primary) hypertension: Secondary | ICD-10-CM

## 2014-04-01 MED ORDER — METOPROLOL SUCCINATE ER 25 MG PO TB24: 25.0000 mg | ORAL_TABLET | Freq: Two times a day (BID) | ORAL | Status: DC

## 2014-04-07 ENCOUNTER — Ambulatory Visit (INDEPENDENT_AMBULATORY_CARE_PROVIDER_SITE_OTHER): Payer: 59 | Admitting: Internal Medicine

## 2014-04-07 ENCOUNTER — Telehealth: Payer: Self-pay | Admitting: Internal Medicine

## 2014-04-07 ENCOUNTER — Other Ambulatory Visit: Payer: 59

## 2014-04-07 ENCOUNTER — Encounter: Payer: Self-pay | Admitting: Internal Medicine

## 2014-04-07 VITALS — BP 136/68 | HR 86 | Temp 98.3°F | Ht 65.0 in | Wt 188.5 lb

## 2014-04-07 DIAGNOSIS — L02415 Cutaneous abscess of right lower limb: Secondary | ICD-10-CM

## 2014-04-07 DIAGNOSIS — L0231 Cutaneous abscess of buttock: Secondary | ICD-10-CM

## 2014-04-07 MED ORDER — FLUCONAZOLE 150 MG PO TABS: 150.0000 mg | ORAL_TABLET | Freq: Once | ORAL | Status: DC

## 2014-04-07 MED ORDER — SULFAMETHOXAZOLE-TRIMETHOPRIM 800-160 MG PO TABS: 1.0000 | ORAL_TABLET | Freq: Two times a day (BID) | ORAL | Status: DC

## 2014-04-07 NOTE — Patient Instructions (Signed)
Incision and Drainage Incision and drainage is a procedure in which a sac-like structure (cystic structure) is opened and drained. The area to be drained usually contains material such as pus, fluid, or blood.  LET YOUR CAREGIVER KNOW ABOUT:   Allergies to medicine.  Medicines taken, including vitamins, herbs, eyedrops, over-the-counter medicines, and creams.  Use of steroids (by mouth or creams).  Previous problems with anesthetics or numbing medicines.  History of bleeding problems or blood clots.  Previous surgery.  Other health problems, including diabetes and kidney problems.  Possibility of pregnancy, if this applies. RISKS AND COMPLICATIONS  Pain.  Bleeding.  Scarring.  Infection. BEFORE THE PROCEDURE  You may need to have an ultrasound or other imaging tests to see how large or deep your cystic structure is. Blood tests may also be used to determine if you have an infection or how severe the infection is. You may need to have a tetanus shot. PROCEDURE  The affected area is cleaned with a cleaning fluid. The cyst area will then be numbed with a medicine (local anesthetic). A small incision will be made in the cystic structure. A syringe or catheter may be used to drain the contents of the cystic structure, or the contents may be squeezed out. The area will then be flushed with a cleansing solution. After cleansing the area, it is often gently packed with a gauze or another wound dressing. Once it is packed, it will be covered with gauze and tape or some other type of wound dressing. AFTER THE PROCEDURE   Often, you will be allowed to go home right after the procedure.  You may be given antibiotic medicine to prevent or heal an infection.  If the area was packed with gauze or some other wound dressing, you will likely need to come back in 1 to 2 days to get it removed.  The area should heal in about 14 days. Document Released: 11/07/2000 Document Revised: 11/13/2011  Document Reviewed: 07/09/2011 [employer] Patient Information 2015 [employer]. This information is not intended to replace advice given to you by your health care provider. Make sure you discuss any questions you have with your health care provider.  

## 2014-04-07 NOTE — Progress Notes (Signed)
   Subjective:    Patient ID: Salome Arnt, female    DOB: 1955/12/19, 58 y.o.   MRN: [id number]  HPI  She complains of a several week history of a painful, enlarging cyst in her right groin. It drains pus and blood.  Review of Systems  Constitutional: Negative.  Negative for fever, chills, diaphoresis, appetite change and fatigue.  HENT: Negative.   Eyes: Negative.   Respiratory: Negative.   Cardiovascular: Negative.  Negative for chest pain, palpitations and leg swelling.  Gastrointestinal: Negative.  Negative for abdominal pain.  Endocrine: Negative.   Musculoskeletal: Negative.   Skin: Negative.   Allergic/Immunologic: Negative.   Neurological: Negative.   Hematological: Negative.  Negative for adenopathy. Does not bruise/bleed easily.  Psychiatric/Behavioral: Negative.        Objective:   Physical Exam  Abdominal:    Skin:  The area in the right groin was cleaned with betadine then local anesthesia was obtained with 2% lido with epi, 2 cc's were used. A 4 mm punch incision was made an a copious amount of thick bloody exudate was release and sent for culture. There were several cavities that were disrupted and irrigated with H2O2 on Qtips, the cavity was then packed with iodofrom and a dressing was applied. She tolerated it well with no blood loss or complications.          Assessment & Plan:

## 2014-04-07 NOTE — Telephone Encounter (Signed)
Patient would like to take same day appt at 4pm to come in b.c she has two cyst that are still not heeled.  Can I put her in?

## 2014-04-07 NOTE — Assessment & Plan Note (Signed)
I and D has been done Clx is pending Will start bactrim-ds to treat the infection I have asked her to RTC in 2 days for a recheck and packing removal

## 2014-04-07 NOTE — Progress Notes (Signed)
Pre visit review using our clinic review tool, if applicable. No additional management support is needed unless otherwise documented below in the visit note. 

## 2014-04-10 ENCOUNTER — Encounter: Payer: Self-pay | Admitting: Internal Medicine

## 2014-04-10 LAB — WOUND CULTURE
GRAM STAIN: NONE SEEN
Gram Stain: NONE SEEN
Organism ID, Bacteria: NO GROWTH

## 2014-04-12 ENCOUNTER — Encounter: Payer: Self-pay | Admitting: Internal Medicine

## 2014-04-12 ENCOUNTER — Ambulatory Visit (INDEPENDENT_AMBULATORY_CARE_PROVIDER_SITE_OTHER): Payer: 59 | Admitting: Internal Medicine

## 2014-04-12 VITALS — BP 110/62 | HR 70 | Temp 98.2°F | Resp 20 | Ht 65.0 in | Wt 189.2 lb

## 2014-04-12 DIAGNOSIS — L732 Hidradenitis suppurativa: Secondary | ICD-10-CM

## 2014-04-12 MED ORDER — DOXYCYCLINE HYCLATE 100 MG PO TABS: 100.0000 mg | ORAL_TABLET | Freq: Two times a day (BID) | ORAL | Status: DC

## 2014-04-12 NOTE — Progress Notes (Signed)
Pre visit review using our clinic review tool, if applicable. No additional management support is needed unless otherwise documented below in the visit note. 

## 2014-04-12 NOTE — Assessment & Plan Note (Signed)
She has recurrent episodes of cysts but the cultures are negative This appears to be hidradenitis Will stop the bactrim, she will take doxy as needed

## 2014-04-12 NOTE — Patient Instructions (Signed)
Hidradenitis Suppurativa, Sweat Gland Abscess Hidradenitis suppurativa is a long lasting (chronic), uncommon disease of the sweat glands. With this, boil-like lumps and scarring develop in the groin, some times under the arms (axillae), and under the breasts. It may also uncommonly occur behind the ears, in the crease of the buttocks, and around the genitals.  CAUSES  The cause is from a blocking of the sweat glands. They then become infected. It may cause drainage and odor. It is not contagious. So it cannot be given to someone else. It most often shows up in puberty (about 80 to 58 years of age). But it may happen much later. It is similar to acne which is a disease of the sweat glands. This condition is slightly more common in African-Americans and women. SYMPTOMS   Hidradenitis usually starts as one or more red, tender, swellings in the groin or under the arms (axilla).  Over a period of hours to days the lesions get larger. They often open to the skin surface, draining clear to yellow-colored fluid.  The infected area heals with scarring. DIAGNOSIS  Your caregiver makes this diagnosis by looking at you. Sometimes cultures (growing germs on plates in the lab) may be taken. This is to see what germ (bacterium) is causing the infection.  TREATMENT   Topical germ killing medicine applied to the skin (antibiotics) are the treatment of choice. Antibiotics taken by mouth (systemic) are sometimes needed when the condition is getting worse or is severe.  Avoid tight-fitting clothing which traps moisture in.  Dirt does not cause hidradenitis and it is not caused by poor hygiene.  Involved areas should be cleaned daily using an antibacterial soap. Some patients find that the liquid form of Lever 2000, applied to the involved areas as a lotion after bathing, can help reduce the odor related to this condition.  Sometimes surgery is needed to drain infected areas or remove scarred tissue. Removal of  large amounts of tissue is used only in severe cases.  Birth control pills may be helpful.  Oral retinoids (vitamin A derivatives) for 6 to 12 months which are effective for acne may also help this condition.  Weight loss will improve but not cure hidradenitis. It is made worse by being overweight. But the condition is not caused by being overweight.  This condition is more common in people who have had acne.  It may become worse under stress. There is no medical cure for hidradenitis. It can be controlled, but not cured. The condition usually continues for years with periods of getting worse and getting better (remission). Document Released: 12/27/2003 Document Revised: 08/06/2011 Document Reviewed: 08/14/2013 [facility] Patient Information 2015 [place], [place]. This information is not intended to replace advice given to you by your health care provider. Make sure you discuss any questions you have with your health care provider.

## 2014-04-12 NOTE — Progress Notes (Signed)
   Subjective:    Patient ID: Salome Arnt, female    DOB: 1956-04-20, 58 y.o.   MRN: [id number]  Wound Check She was originally treated 3 to 5 days ago. Previous treatment included I&D of abscess. Her temperature was unmeasured prior to arrival. There has been no drainage from the wound. There is no redness present. There is no swelling present. The pain has no pain. She has no difficulty moving the affected extremity or digit.      Review of Systems  Constitutional: Negative.  Negative for fever, chills, diaphoresis, appetite change and fatigue.  HENT: Negative.   Eyes: Negative.   Respiratory: Negative.   Cardiovascular: Negative.  Negative for chest pain, palpitations and leg swelling.  Gastrointestinal: Negative for abdominal pain.  Endocrine: Negative.   Genitourinary: Negative.   Musculoskeletal: Negative.   Skin: Negative.   Allergic/Immunologic: Negative.   Neurological: Negative.   Hematological: Negative.  Does not bruise/bleed easily.  Psychiatric/Behavioral: Negative.        Objective:   Physical Exam  Constitutional:  Non-toxic appearance. She does not have a sickly appearance. She does not appear ill.  Skin:     Vitals reviewed.         Assessment & Plan:

## 2014-05-10 ENCOUNTER — Telehealth: Payer: Self-pay | Admitting: Internal Medicine

## 2014-05-10 DIAGNOSIS — L732 Hidradenitis suppurativa: Secondary | ICD-10-CM

## 2014-05-10 NOTE — Telephone Encounter (Signed)
I want her to see a dermatologist about this Referral was sent

## 2014-05-10 NOTE — Telephone Encounter (Signed)
Pt came by office requesting advise from Dr Ronnald Ramp. Pt states that the area he last assessed on her has not healed and it has been one month. Pt has questions regarding antibiotic and about what is reflecting on her now. Please call her when request is reviewed.

## 2014-05-24 NOTE — Telephone Encounter (Signed)
Notified patient that Dr. Ronnald Ramp does want her to see the Dermatologist. Patient stated that she is planning to go.

## 2014-05-24 NOTE — Telephone Encounter (Signed)
Pt has derm appt on 06/02/14. She states the abscess is still not healed. She is unsure if she really needs to see dermatology or someone else. Please advise pt. CB# [phone]

## 2014-09-24 ENCOUNTER — Other Ambulatory Visit: Payer: Self-pay | Admitting: Internal Medicine

## 2014-10-26 ENCOUNTER — Telehealth: Payer: Self-pay | Admitting: Internal Medicine

## 2014-10-26 MED ORDER — METOPROLOL SUCCINATE ER 25 MG PO TB24: 25.0000 mg | ORAL_TABLET | Freq: Two times a day (BID) | ORAL | Status: DC

## 2014-10-26 NOTE — Telephone Encounter (Signed)
Approved, pt notified

## 2014-10-26 NOTE — Telephone Encounter (Signed)
Is requesting 30 day refill on metoprolol to get her through until CPE.  Patient states she is out of this med.

## 2014-11-10 ENCOUNTER — Ambulatory Visit (INDEPENDENT_AMBULATORY_CARE_PROVIDER_SITE_OTHER): Payer: 59 | Admitting: Internal Medicine

## 2014-11-10 ENCOUNTER — Other Ambulatory Visit (INDEPENDENT_AMBULATORY_CARE_PROVIDER_SITE_OTHER): Payer: 59

## 2014-11-10 ENCOUNTER — Encounter: Payer: Self-pay | Admitting: Internal Medicine

## 2014-11-10 VITALS — BP 122/68 | HR 66 | Temp 98.6°F | Resp 16 | Ht 65.0 in | Wt 184.0 lb

## 2014-11-10 DIAGNOSIS — M170 Bilateral primary osteoarthritis of knee: Secondary | ICD-10-CM

## 2014-11-10 DIAGNOSIS — Z0000 Encounter for general adult medical examination without abnormal findings: Secondary | ICD-10-CM | POA: Diagnosis not present

## 2014-11-10 DIAGNOSIS — I10 Essential (primary) hypertension: Secondary | ICD-10-CM

## 2014-11-10 LAB — COMPREHENSIVE METABOLIC PANEL
ALK PHOS: 99 U/L (ref 39–117)
ALT: 13 U/L (ref 0–35)
AST: 18 U/L (ref 0–37)
Albumin: 4.3 g/dL (ref 3.5–5.2)
BUN: 15 mg/dL (ref 6–23)
CO2: 31 mEq/L (ref 19–32)
Calcium: 9.4 mg/dL (ref 8.4–10.5)
Chloride: 102 mEq/L (ref 96–112)
Creatinine, Ser: 0.74 mg/dL (ref 0.40–1.20)
GFR: 103.43 mL/min (ref >60.00)
GLUCOSE: 100 mg/dL — AB (ref 70–99)
POTASSIUM: 3.4 meq/L — AB (ref 3.5–5.1)
Sodium: 139 mEq/L (ref 135–145)
TOTAL PROTEIN: 7.5 g/dL (ref 6.0–8.3)
Total Bilirubin: 0.4 mg/dL (ref 0.2–1.2)

## 2014-11-10 LAB — CBC WITH DIFFERENTIAL/PLATELET
BASOS PCT: 0.4 % (ref 0.0–3.0)
Basophils Absolute: 0 10*3/uL (ref 0.0–0.1)
EOS ABS: 0.2 10*3/uL (ref 0.0–0.7)
Eosinophils Relative: 2.6 % (ref 0.0–5.0)
HEMATOCRIT: 37.6 % (ref 36.0–46.0)
Hemoglobin: 12.3 g/dL (ref 12.0–15.0)
LYMPHS ABS: 1.6 10*3/uL (ref 0.7–4.0)
Lymphocytes Relative: 17.4 % (ref 12.0–46.0)
MCHC: 32.7 g/dL (ref 30.0–36.0)
MCV: 87.1 fl (ref 78.0–100.0)
MONO ABS: 0.4 10*3/uL (ref 0.1–1.0)
Monocytes Relative: 4.7 % (ref 3.0–12.0)
NEUTROS PCT: 74.9 % (ref 43.0–77.0)
Neutro Abs: 7 10*3/uL (ref 1.4–7.7)
PLATELETS: 301 10*3/uL (ref 150.0–400.0)
RBC: 4.32 Mil/uL (ref 3.87–5.11)
RDW: 13.5 % (ref 11.5–15.5)
WBC: 9.3 10*3/uL (ref 4.0–10.5)

## 2014-11-10 LAB — TSH: TSH: 0.49 u[IU]/mL (ref 0.35–4.50)

## 2014-11-10 LAB — LIPID PANEL
Cholesterol: 218 mg/dL — ABNORMAL HIGH (ref 0–200)
HDL: 43.5 mg/dL (ref >39.00)
LDL Cholesterol: 161 mg/dL — ABNORMAL HIGH (ref 0–99)
NONHDL: 174.5
Total CHOL/HDL Ratio: 5
Triglycerides: 66 mg/dL (ref 0.0–149.0)
VLDL: 13.2 mg/dL (ref 0.0–40.0)

## 2014-11-10 MED ORDER — HYDROCHLOROTHIAZIDE 25 MG PO TABS: ORAL_TABLET | ORAL | Status: DC

## 2014-11-10 MED ORDER — PANTOPRAZOLE SODIUM 40 MG PO TBEC: 40.0000 mg | DELAYED_RELEASE_TABLET | Freq: Every day | ORAL | Status: DC

## 2014-11-10 MED ORDER — AMLODIPINE BESYLATE 5 MG PO TABS: 5.0000 mg | ORAL_TABLET | Freq: Every day | ORAL | Status: DC

## 2014-11-10 MED ORDER — METOPROLOL SUCCINATE ER 25 MG PO TB24: 25.0000 mg | ORAL_TABLET | Freq: Two times a day (BID) | ORAL | Status: DC

## 2014-11-10 MED ORDER — IBUPROFEN 800 MG PO TABS: 800.0000 mg | ORAL_TABLET | Freq: Three times a day (TID) | ORAL | Status: DC | PRN

## 2014-11-10 NOTE — Progress Notes (Signed)
Pre visit review using our clinic review tool, if applicable. No additional management support is needed unless otherwise documented below in the visit note. 

## 2014-11-10 NOTE — Patient Instructions (Signed)
Preventive Care for Adults A healthy lifestyle and preventive care can promote health and wellness. Preventive health guidelines for women include the following key practices.  A routine yearly physical is a good way to check with your health care provider about your health and preventive screening. It is a chance to share any concerns and updates on your health and to receive a thorough exam.  Visit your dentist for a routine exam and preventive care every 6 months. Brush your teeth twice a day and floss once a day. Good oral hygiene prevents tooth decay and gum disease.  The frequency of eye exams is based on your age, health, family medical history, use of contact lenses, and other factors. Follow your health care provider's recommendations for frequency of eye exams.  Eat a healthy diet. Foods like vegetables, fruits, whole grains, low-fat dairy products, and lean protein foods contain the nutrients you need without too many calories. Decrease your intake of foods high in solid fats, added sugars, and salt. Eat the right amount of calories for you.Get information about a proper diet from your health care provider, if necessary.  Regular physical exercise is one of the most important things you can do for your health. Most adults should get at least 150 minutes of moderate-intensity exercise (any activity that increases your heart rate and causes you to sweat) each week. In addition, most adults need muscle-strengthening exercises on 2 or more days a week.  Maintain a healthy weight. The body mass index (BMI) is a screening tool to identify possible weight problems. It provides an estimate of body fat based on height and weight. Your health care provider can find your BMI and can help you achieve or maintain a healthy weight.For adults 20 years and older:  A BMI below 18.5 is considered underweight.  A BMI of 18.5 to 24.9 is normal.  A BMI of 25 to 29.9 is considered overweight.  A BMI of  30 and above is considered obese.  Maintain normal blood lipids and cholesterol levels by exercising and minimizing your intake of saturated fat. Eat a balanced diet with plenty of fruit and vegetables. Blood tests for lipids and cholesterol should begin at age 76 and be repeated every 5 years. If your lipid or cholesterol levels are high, you are over 50, or you are at high risk for heart disease, you may need your cholesterol levels checked more frequently.Ongoing high lipid and cholesterol levels should be treated with medicines if diet and exercise are not working.  If you smoke, find out from your health care provider how to quit. If you do not use tobacco, do not start.  Lung cancer screening is recommended for adults aged 22-80 years who are at high risk for developing lung cancer because of a history of smoking. A yearly low-dose CT scan of the lungs is recommended for people who have at least a 30-pack-year history of smoking and are a current smoker or have quit within the past 15 years. A pack year of smoking is smoking an average of 1 pack of cigarettes a day for 1 year (for example: 1 pack a day for 30 years or 2 packs a day for 15 years). Yearly screening should continue until the smoker has stopped smoking for at least 15 years. Yearly screening should be stopped for people who develop a health problem that would prevent them from having lung cancer treatment.  If you are pregnant, do not drink alcohol. If you are breastfeeding,  be very cautious about drinking alcohol. If you are not pregnant and choose to drink alcohol, do not have more than 1 drink per day. One drink is considered to be 12 ounces (355 mL) of beer, 5 ounces (148 mL) of wine, or 1.5 ounces (44 mL) of liquor.  Avoid use of street drugs. Do not share needles with anyone. Ask for help if you need support or instructions about stopping the use of drugs.  High blood pressure causes heart disease and increases the risk of  stroke. Your blood pressure should be checked at least every 1 to 2 years. Ongoing high blood pressure should be treated with medicines if weight loss and exercise do not work.  If you are 75-52 years old, ask your health care provider if you should take aspirin to prevent strokes.  Diabetes screening involves taking a blood sample to check your fasting blood sugar level. This should be done once every 3 years, after age 15, if you are within normal weight and without risk factors for diabetes. Testing should be considered at a younger age or be carried out more frequently if you are overweight and have at least 1 risk factor for diabetes.  Breast cancer screening is essential preventive care for women. You should practice "breast self-awareness." This means understanding the normal appearance and feel of your breasts and may include breast self-examination. Any changes detected, no matter how small, should be reported to a health care provider. Women in their 58s and 30s should have a clinical breast exam (CBE) by a health care provider as part of a regular health exam every 1 to 3 years. After age 16, women should have a CBE every year. Starting at age 53, women should consider having a mammogram (breast X-ray test) every year. Women who have a family history of breast cancer should talk to their health care provider about genetic screening. Women at a high risk of breast cancer should talk to their health care providers about having an MRI and a mammogram every year.  Breast cancer gene (BRCA)-related cancer risk assessment is recommended for women who have family members with BRCA-related cancers. BRCA-related cancers include breast, ovarian, tubal, and peritoneal cancers. Having family members with these cancers may be associated with an increased risk for harmful changes (mutations) in the breast cancer genes BRCA1 and BRCA2. Results of the assessment will determine the need for genetic counseling and  BRCA1 and BRCA2 testing.  Routine pelvic exams to screen for cancer are no longer recommended for nonpregnant women who are considered low risk for cancer of the pelvic organs (ovaries, uterus, and vagina) and who do not have symptoms. Ask your health care provider if a screening pelvic exam is right for you.  If you have had past treatment for cervical cancer or a condition that could lead to cancer, you need Pap tests and screening for cancer for at least 20 years after your treatment. If Pap tests have been discontinued, your risk factors (such as having a new sexual partner) need to be reassessed to determine if screening should be resumed. Some women have medical problems that increase the chance of getting cervical cancer. In these cases, your health care provider may recommend more frequent screening and Pap tests.  The HPV test is an additional test that may be used for cervical cancer screening. The HPV test looks for the virus that can cause the cell changes on the cervix. The cells collected during the Pap test can be  tested for HPV. The HPV test could be used to screen women aged 30 years and older, and should be used in women of any age who have unclear Pap test results. After the age of 30, women should have HPV testing at the same frequency as a Pap test.  Colorectal cancer can be detected and often prevented. Most routine colorectal cancer screening begins at the age of 50 years and continues through age 75 years. However, your health care provider may recommend screening at an earlier age if you have risk factors for colon cancer. On a yearly basis, your health care provider may provide home test kits to check for hidden blood in the stool. Use of a small camera at the end of a tube, to directly examine the colon (sigmoidoscopy or colonoscopy), can detect the earliest forms of colorectal cancer. Talk to your health care provider about this at age 50, when routine screening begins. Direct  exam of the colon should be repeated every 5-10 years through age 75 years, unless early forms of pre-cancerous polyps or small growths are found.  People who are at an increased risk for hepatitis B should be screened for this virus. You are considered at high risk for hepatitis B if:  You were born in a country where hepatitis B occurs often. Talk with your health care provider about which countries are considered high risk.  Your parents were born in a high-risk country and you have not received a shot to protect against hepatitis B (hepatitis B vaccine).  You have HIV or AIDS.  You use needles to inject street drugs.  You live with, or have sex with, someone who has hepatitis B.  You get hemodialysis treatment.  You take certain medicines for conditions like cancer, organ transplantation, and autoimmune conditions.  Hepatitis C blood testing is recommended for all people born from 1945 through 1965 and any individual with known risks for hepatitis C.  Practice safe sex. Use condoms and avoid high-risk sexual practices to reduce the spread of sexually transmitted infections (STIs). STIs include gonorrhea, chlamydia, syphilis, trichomonas, herpes, HPV, and human immunodeficiency virus (HIV). Herpes, HIV, and HPV are viral illnesses that have no cure. They can result in disability, cancer, and death.  You should be screened for sexually transmitted illnesses (STIs) including gonorrhea and chlamydia if:  You are sexually active and are younger than 24 years.  You are older than 24 years and your health care provider tells you that you are at risk for this type of infection.  Your sexual activity has changed since you were last screened and you are at an increased risk for chlamydia or gonorrhea. Ask your health care provider if you are at risk.  If you are at risk of being infected with HIV, it is recommended that you take a prescription medicine daily to prevent HIV infection. This is  called preexposure prophylaxis (PrEP). You are considered at risk if:  You are a heterosexual woman, are sexually active, and are at increased risk for HIV infection.  You take drugs by injection.  You are sexually active with a partner who has HIV.  Talk with your health care provider about whether you are at high risk of being infected with HIV. If you choose to begin PrEP, you should first be tested for HIV. You should then be tested every 3 months for as long as you are taking PrEP.  Osteoporosis is a disease in which the bones lose minerals and strength   with aging. This can result in serious bone fractures or breaks. The risk of osteoporosis can be identified using a bone density scan. Women ages 65 years and over and women at risk for fractures or osteoporosis should discuss screening with their health care providers. Ask your health care provider whether you should take a calcium supplement or vitamin D to reduce the rate of osteoporosis.  Menopause can be associated with physical symptoms and risks. Hormone replacement therapy is available to decrease symptoms and risks. You should talk to your health care provider about whether hormone replacement therapy is right for you.  Use sunscreen. Apply sunscreen liberally and repeatedly throughout the day. You should seek shade when your shadow is shorter than you. Protect yourself by wearing long sleeves, pants, a wide-brimmed hat, and sunglasses year round, whenever you are outdoors.  Once a month, do a whole body skin exam, using a mirror to look at the skin on your back. Tell your health care provider of new moles, moles that have irregular borders, moles that are larger than a pencil eraser, or moles that have changed in shape or color.  Stay current with required vaccines (immunizations).  Influenza vaccine. All adults should be immunized every year.  Tetanus, diphtheria, and acellular pertussis (Td, Tdap) vaccine. Pregnant women should  receive 1 dose of Tdap vaccine during each pregnancy. The dose should be obtained regardless of the length of time since the last dose. Immunization is preferred during the 27th-36th week of gestation. An adult who has not previously received Tdap or who does not know her vaccine status should receive 1 dose of Tdap. This initial dose should be followed by tetanus and diphtheria toxoids (Td) booster doses every 10 years. Adults with an unknown or incomplete history of completing a 3-dose immunization series with Td-containing vaccines should begin or complete a primary immunization series including a Tdap dose. Adults should receive a Td booster every 10 years.  Varicella vaccine. An adult without evidence of immunity to varicella should receive 2 doses or a second dose if she has previously received 1 dose. Pregnant females who do not have evidence of immunity should receive the first dose after pregnancy. This first dose should be obtained before leaving the health care facility. The second dose should be obtained 4-8 weeks after the first dose.  Human papillomavirus (HPV) vaccine. Females aged 13-26 years who have not received the vaccine previously should obtain the 3-dose series. The vaccine is not recommended for use in pregnant females. However, pregnancy testing is not needed before receiving a dose. If a female is found to be pregnant after receiving a dose, no treatment is needed. In that case, the remaining doses should be delayed until after the pregnancy. Immunization is recommended for any person with an immunocompromised condition through the age of 26 years if she did not get any or all doses earlier. During the 3-dose series, the second dose should be obtained 4-8 weeks after the first dose. The third dose should be obtained 24 weeks after the first dose and 16 weeks after the second dose.  Zoster vaccine. One dose is recommended for adults aged 60 years or older unless certain conditions are  present.  Measles, mumps, and rubella (MMR) vaccine. Adults born before 1957 generally are considered immune to measles and mumps. Adults born in 1957 or later should have 1 or more doses of MMR vaccine unless there is a contraindication to the vaccine or there is laboratory evidence of immunity to   each of the three diseases. A routine second dose of MMR vaccine should be obtained at least 28 days after the first dose for students attending postsecondary schools, health care workers, or international travelers. People who received inactivated measles vaccine or an unknown type of measles vaccine during 1963-1967 should receive 2 doses of MMR vaccine. People who received inactivated mumps vaccine or an unknown type of mumps vaccine before 1979 and are at high risk for mumps infection should consider immunization with 2 doses of MMR vaccine. For females of childbearing age, rubella immunity should be determined. If there is no evidence of immunity, females who are not pregnant should be vaccinated. If there is no evidence of immunity, females who are pregnant should delay immunization until after pregnancy. Unvaccinated health care workers born before 1957 who lack laboratory evidence of measles, mumps, or rubella immunity or laboratory confirmation of disease should consider measles and mumps immunization with 2 doses of MMR vaccine or rubella immunization with 1 dose of MMR vaccine.  Pneumococcal 13-valent conjugate (PCV13) vaccine. When indicated, a person who is uncertain of her immunization history and has no record of immunization should receive the PCV13 vaccine. An adult aged 19 years or older who has certain medical conditions and has not been previously immunized should receive 1 dose of PCV13 vaccine. This PCV13 should be followed with a dose of pneumococcal polysaccharide (PPSV23) vaccine. The PPSV23 vaccine dose should be obtained at least 8 weeks after the dose of PCV13 vaccine. An adult aged 19  years or older who has certain medical conditions and previously received 1 or more doses of PPSV23 vaccine should receive 1 dose of PCV13. The PCV13 vaccine dose should be obtained 1 or more years after the last PPSV23 vaccine dose.  Pneumococcal polysaccharide (PPSV23) vaccine. When PCV13 is also indicated, PCV13 should be obtained first. All adults aged 65 years and older should be immunized. An adult younger than age 65 years who has certain medical conditions should be immunized. Any person who resides in a nursing home or long-term care facility should be immunized. An adult smoker should be immunized. People with an immunocompromised condition and certain other conditions should receive both PCV13 and PPSV23 vaccines. People with human immunodeficiency virus (HIV) infection should be immunized as soon as possible after diagnosis. Immunization during chemotherapy or radiation therapy should be avoided. Routine use of PPSV23 vaccine is not recommended for American Indians, [place] Natives, or people younger than 65 years unless there are medical conditions that require PPSV23 vaccine. When indicated, people who have unknown immunization and have no record of immunization should receive PPSV23 vaccine. One-time revaccination 5 years after the first dose of PPSV23 is recommended for people aged 19-64 years who have chronic kidney failure, nephrotic syndrome, asplenia, or immunocompromised conditions. People who received 1-2 doses of PPSV23 before age 65 years should receive another dose of PPSV23 vaccine at age 65 years or later if at least 5 years have passed since the previous dose. Doses of PPSV23 are not needed for people immunized with PPSV23 at or after age 65 years.  Meningococcal vaccine. Adults with asplenia or persistent complement component deficiencies should receive 2 doses of quadrivalent meningococcal conjugate (MenACWY-D) vaccine. The doses should be obtained at least 2 months apart.  Microbiologists working with certain meningococcal bacteria, military recruits, people at risk during an outbreak, and people who travel to or live in countries with a high rate of meningitis should be immunized. A first-year college student up through age   21 years who is living in a residence hall should receive a dose if she did not receive a dose on or after her 16th birthday. Adults who have certain high-risk conditions should receive one or more doses of vaccine.  Hepatitis A vaccine. Adults who wish to be protected from this disease, have certain high-risk conditions, work with hepatitis A-infected animals, work in hepatitis A research labs, or travel to or work in countries with a high rate of hepatitis A should be immunized. Adults who were previously unvaccinated and who anticipate close contact with an international adoptee during the first 60 days after arrival in the [place] from a country with a high rate of hepatitis A should be immunized.  Hepatitis B vaccine. Adults who wish to be protected from this disease, have certain high-risk conditions, may be exposed to blood or other infectious body fluids, are household contacts or sex partners of hepatitis B positive people, are clients or workers in certain care facilities, or travel to or work in countries with a high rate of hepatitis B should be immunized.  Haemophilus influenzae type b (Hib) vaccine. A previously unvaccinated person with asplenia or sickle cell disease or having a scheduled splenectomy should receive 1 dose of Hib vaccine. Regardless of previous immunization, a recipient of a hematopoietic stem cell transplant should receive a 3-dose series 6-12 months after her successful transplant. Hib vaccine is not recommended for adults with HIV infection. Preventive Services / Frequency Ages 64 to 68 years  Blood pressure check.** / Every 1 to 2 years.  Lipid and cholesterol check.** / Every 5 years beginning at age  22.  Clinical breast exam.** / Every 3 years for women in their 88s and 53s.  BRCA-related cancer risk assessment.** / For women who have family members with a BRCA-related cancer (breast, ovarian, tubal, or peritoneal cancers).  Pap test.** / Every 2 years from ages 90 through 51. Every 3 years starting at age 21 through age 56 or 3 with a history of 3 consecutive normal Pap tests.  HPV screening.** / Every 3 years from ages 24 through ages 1 to 46 with a history of 3 consecutive normal Pap tests.  Hepatitis C blood test.** / For any individual with known risks for hepatitis C.  Skin self-exam. / Monthly.  Influenza vaccine. / Every year.  Tetanus, diphtheria, and acellular pertussis (Tdap, Td) vaccine.** / Consult your health care provider. Pregnant women should receive 1 dose of Tdap vaccine during each pregnancy. 1 dose of Td every 10 years.  Varicella vaccine.** / Consult your health care provider. Pregnant females who do not have evidence of immunity should receive the first dose after pregnancy.  HPV vaccine. / 3 doses over 6 months, if 72 and younger. The vaccine is not recommended for use in pregnant females. However, pregnancy testing is not needed before receiving a dose.  Measles, mumps, rubella (MMR) vaccine.** / You need at least 1 dose of MMR if you were born in 1957 or later. You may also need a 2nd dose. For females of childbearing age, rubella immunity should be determined. If there is no evidence of immunity, females who are not pregnant should be vaccinated. If there is no evidence of immunity, females who are pregnant should delay immunization until after pregnancy.  Pneumococcal 13-valent conjugate (PCV13) vaccine.** / Consult your health care provider.  Pneumococcal polysaccharide (PPSV23) vaccine.** / 1 to 2 doses if you smoke cigarettes or if you have certain conditions.  Meningococcal vaccine.** /  1 dose if you are age 19 to 21 years and a first-year college  student living in a residence hall, or have one of several medical conditions, you need to get vaccinated against meningococcal disease. You may also need additional booster doses.  Hepatitis A vaccine.** / Consult your health care provider.  Hepatitis B vaccine.** / Consult your health care provider.  Haemophilus influenzae type b (Hib) vaccine.** / Consult your health care provider. Ages 40 to 64 years  Blood pressure check.** / Every 1 to 2 years.  Lipid and cholesterol check.** / Every 5 years beginning at age 20 years.  Lung cancer screening. / Every year if you are aged 55-80 years and have a 30-pack-year history of smoking and currently smoke or have quit within the past 15 years. Yearly screening is stopped once you have quit smoking for at least 15 years or develop a health problem that would prevent you from having lung cancer treatment.  Clinical breast exam.** / Every year after age 40 years.  BRCA-related cancer risk assessment.** / For women who have family members with a BRCA-related cancer (breast, ovarian, tubal, or peritoneal cancers).  Mammogram.** / Every year beginning at age 40 years and continuing for as long as you are in good health. Consult with your health care provider.  Pap test.** / Every 3 years starting at age 30 years through age 65 or 70 years with a history of 3 consecutive normal Pap tests.  HPV screening.** / Every 3 years from ages 30 years through ages 65 to 70 years with a history of 3 consecutive normal Pap tests.  Fecal occult blood test (FOBT) of stool. / Every year beginning at age 50 years and continuing until age 75 years. You may not need to do this test if you get a colonoscopy every 10 years.  Flexible sigmoidoscopy or colonoscopy.** / Every 5 years for a flexible sigmoidoscopy or every 10 years for a colonoscopy beginning at age 50 years and continuing until age 75 years.  Hepatitis C blood test.** / For all people born from 1945 through  1965 and any individual with known risks for hepatitis C.  Skin self-exam. / Monthly.  Influenza vaccine. / Every year.  Tetanus, diphtheria, and acellular pertussis (Tdap/Td) vaccine.** / Consult your health care provider. Pregnant women should receive 1 dose of Tdap vaccine during each pregnancy. 1 dose of Td every 10 years.  Varicella vaccine.** / Consult your health care provider. Pregnant females who do not have evidence of immunity should receive the first dose after pregnancy.  Zoster vaccine.** / 1 dose for adults aged 60 years or older.  Measles, mumps, rubella (MMR) vaccine.** / You need at least 1 dose of MMR if you were born in 1957 or later. You may also need a 2nd dose. For females of childbearing age, rubella immunity should be determined. If there is no evidence of immunity, females who are not pregnant should be vaccinated. If there is no evidence of immunity, females who are pregnant should delay immunization until after pregnancy.  Pneumococcal 13-valent conjugate (PCV13) vaccine.** / Consult your health care provider.  Pneumococcal polysaccharide (PPSV23) vaccine.** / 1 to 2 doses if you smoke cigarettes or if you have certain conditions.  Meningococcal vaccine.** / Consult your health care provider.  Hepatitis A vaccine.** / Consult your health care provider.  Hepatitis B vaccine.** / Consult your health care provider.  Haemophilus influenzae type b (Hib) vaccine.** / Consult your health care provider. Ages 65   years and over  Blood pressure check.** / Every 1 to 2 years.  Lipid and cholesterol check.** / Every 5 years beginning at age 22 years.  Lung cancer screening. / Every year if you are aged 73-80 years and have a 30-pack-year history of smoking and currently smoke or have quit within the past 15 years. Yearly screening is stopped once you have quit smoking for at least 15 years or develop a health problem that would prevent you from having lung cancer  treatment.  Clinical breast exam.** / Every year after age 4 years.  BRCA-related cancer risk assessment.** / For women who have family members with a BRCA-related cancer (breast, ovarian, tubal, or peritoneal cancers).  Mammogram.** / Every year beginning at age 40 years and continuing for as long as you are in good health. Consult with your health care provider.  Pap test.** / Every 3 years starting at age 9 years through age 34 or 91 years with 3 consecutive normal Pap tests. Testing can be stopped between 65 and 70 years with 3 consecutive normal Pap tests and no abnormal Pap or HPV tests in the past 10 years.  HPV screening.** / Every 3 years from ages 57 years through ages 64 or 45 years with a history of 3 consecutive normal Pap tests. Testing can be stopped between 65 and 70 years with 3 consecutive normal Pap tests and no abnormal Pap or HPV tests in the past 10 years.  Fecal occult blood test (FOBT) of stool. / Every year beginning at age 15 years and continuing until age 17 years. You may not need to do this test if you get a colonoscopy every 10 years.  Flexible sigmoidoscopy or colonoscopy.** / Every 5 years for a flexible sigmoidoscopy or every 10 years for a colonoscopy beginning at age 86 years and continuing until age 71 years.  Hepatitis C blood test.** / For all people born from [birth year] through 1965 and any individual with known risks for hepatitis C.  Osteoporosis screening.** / A one-time screening for women ages 83 years and over and women at risk for fractures or osteoporosis.  Skin self-exam. / Monthly.  Influenza vaccine. / Every year.  Tetanus, diphtheria, and acellular pertussis (Tdap/Td) vaccine.** / 1 dose of Td every 10 years.  Varicella vaccine.** / Consult your health care provider.  Zoster vaccine.** / 1 dose for adults aged 61 years or older.  Pneumococcal 13-valent conjugate (PCV13) vaccine.** / Consult your health care provider.  Pneumococcal  polysaccharide (PPSV23) vaccine.** / 1 dose for all adults aged 28 years and older.  Meningococcal vaccine.** / Consult your health care provider.  Hepatitis A vaccine.** / Consult your health care provider.  Hepatitis B vaccine.** / Consult your health care provider.  Haemophilus influenzae type b (Hib) vaccine.** / Consult your health care provider. ** Family history and personal history of risk and conditions may change your health care provider's recommendations. Document Released: 07/10/2001 Document Revised: 09/28/2013 Document Reviewed: 10/09/2010 [facility] Patient Information 2015 [place], [place]. This information is not intended to replace advice given to you by your health care provider. Make sure you discuss any questions you have with your health care provider.

## 2014-11-10 NOTE — Progress Notes (Signed)
Subjective:  Patient ID: Julie Clark, female    DOB: 09-24-1955  Age: 59 y.o. MRN: [id number]  CC: Hypertension; Hyperlipidemia; and Annual Exam   HPI Julie Clark presents for a CPX, she feels well and offers no new complaints.  Outpatient Prescriptions Prior to Visit  Medication Sig Dispense Refill  . aspirin 81 MG tablet Take 81 mg by mouth daily.    Marland Kitchen amLODipine (NORVASC) 5 MG tablet TAKE 1 TABLET BY MOUTH DAILY. 90 tablet 3  . hydrochlorothiazide (HYDRODIURIL) 25 MG tablet TAKE 1 TABLET BY MOUTH ONCE DAILY 90 tablet 3  . ibuprofen (ADVIL,MOTRIN) 800 MG tablet Take 1 tablet (800 mg total) by mouth every 8 (eight) hours as needed. 90 tablet 5  . metoprolol succinate (TOPROL-XL) 25 MG 24 hr tablet Take 1 tablet (25 mg total) by mouth 2 (two) times daily. 180 tablet 1  . pantoprazole (PROTONIX) 40 MG tablet Take 1 tablet (40 mg total) by mouth daily. 30 tablet 12  . doxycycline (VIBRA-TABS) 100 MG tablet Take 1 tablet (100 mg total) by mouth 2 (two) times daily. 20 tablet 3  . fluconazole (DIFLUCAN) 150 MG tablet Take 1 tablet (150 mg total) by mouth once. 1 tablet 3   No facility-administered medications prior to visit.    ROS Review of Systems  Constitutional: Negative.  Negative for fever, chills, diaphoresis, appetite change and fatigue.  HENT: Negative.   Eyes: Negative.   Respiratory: Negative.  Negative for cough, choking, chest tightness, shortness of breath and stridor.   Cardiovascular: Negative.  Negative for chest pain and leg swelling.  Gastrointestinal: Negative.  Negative for nausea, abdominal pain, diarrhea, constipation and blood in stool.  Endocrine: Negative.   Genitourinary: Negative.   Musculoskeletal: Positive for arthralgias (knee pain). Negative for myalgias, back pain, joint swelling, gait problem, neck pain and neck stiffness.  Skin: Negative.  Negative for rash.  Allergic/Immunologic: Negative.   Neurological: Negative.  Negative for dizziness,  syncope, speech difficulty, weakness, light-headedness, numbness and headaches.  Hematological: Negative.  Negative for adenopathy. Does not bruise/bleed easily.  Psychiatric/Behavioral: Negative.     Objective:  BP 122/68 mmHg  Pulse 66  Temp(Src) 98.6 F (37 C) (Oral)  Resp 16  Ht 5' 5"  (1.651 m)  Wt 184 lb (83.462 kg)  BMI 30.62 kg/m2  SpO2 95%  BP Readings from Last 3 Encounters:  11/10/14 122/68  04/12/14 110/62  04/07/14 136/68    Wt Readings from Last 3 Encounters:  11/10/14 184 lb (83.462 kg)  04/12/14 189 lb 4 oz (85.843 kg)  04/07/14 188 lb 8 oz (85.503 kg)    Physical Exam  Constitutional: She is oriented to person, place, and time. She appears well-developed and well-nourished. No distress.  HENT:  Head: Normocephalic and atraumatic.  Mouth/Throat: Oropharynx is clear and moist. No oropharyngeal exudate.  Eyes: Conjunctivae are normal. Right eye exhibits no discharge. Left eye exhibits no discharge. No scleral icterus.  Neck: Normal range of motion. Neck supple. No JVD present. No tracheal deviation present. No thyromegaly present.  Cardiovascular: Normal rate, regular rhythm, normal heart sounds and intact distal pulses.  Exam reveals no gallop and no friction rub.   No murmur heard. Pulmonary/Chest: Effort normal and breath sounds normal. No stridor. No respiratory distress. She has no wheezes. She has no rales. She exhibits no tenderness.  Abdominal: Soft. Bowel sounds are normal. She exhibits no distension and no mass. There is no tenderness. There is no rebound and no guarding.  Musculoskeletal: Normal range of motion. She exhibits no edema or tenderness.  Lymphadenopathy:    She has no cervical adenopathy.  Neurological: She is oriented to person, place, and time.  Skin: Skin is warm and dry. No rash noted. She is not diaphoretic. No erythema. No pallor.  Psychiatric: She has a normal mood and affect. Her behavior is normal. Judgment and thought content  normal.  Vitals reviewed.   Lab Results  Component Value Date   WBC 9.3 11/10/2014   HGB 12.3 11/10/2014   HCT 37.6 11/10/2014   PLT 301.0 11/10/2014   GLUCOSE 100* 11/10/2014   CHOL 218* 11/10/2014   TRIG 66.0 11/10/2014   HDL 43.50 11/10/2014   LDLCALC 161* 11/10/2014   ALT 13 11/10/2014   AST 18 11/10/2014   NA 139 11/10/2014   K 3.4* 11/10/2014   CL 102 11/10/2014   CREATININE 0.74 11/10/2014   BUN 15 11/10/2014   CO2 31 11/10/2014   TSH 0.49 11/10/2014    Mm Digital Screening Bilateral  02/26/2014   CLINICAL DATA:  Screening.  EXAM: DIGITAL SCREENING BILATERAL MAMMOGRAM WITH CAD  COMPARISON:  Previous exam(s).  ACR Breast Density Category c: The breast tissue is heterogeneously dense, which may obscure small masses.  FINDINGS: There are no findings suspicious for malignancy. Images were processed with CAD.  IMPRESSION: No mammographic evidence of malignancy. A result letter of this screening mammogram will be mailed directly to the patient.  RECOMMENDATION: Screening mammogram in one year. (Code:[id number]-B-01Y)  BI-RADS CATEGORY  1: Negative.   Electronically Signed   By: Shon Hale M.D.   On: 02/26/2014 09:25    Assessment & Plan:   Julie Clark was seen today for hypertension, hyperlipidemia and annual exam.  Diagnoses and all orders for this visit:  Routine general medical examination at a health care facility - vaccines were reviewed, PAP and mammo are UTD, labs ordered, pt ed material was given Orders: -     Lipid panel; Future -     Comprehensive metabolic panel; Future -     CBC with Differential/Platelet; Future -     TSH; Future  Essential hypertension, benign Orders: -     hydrochlorothiazide (HYDRODIURIL) 25 MG tablet; TAKE 1 TABLET BY MOUTH ONCE DAILY  Primary osteoarthritis of both knees Orders: -     ibuprofen (ADVIL,MOTRIN) 800 MG tablet; Take 1 tablet (800 mg total) by mouth every 8 (eight) hours as needed.  Other orders -     amLODipine (NORVASC) 5 MG  tablet; Take 1 tablet (5 mg total) by mouth daily. -     metoprolol succinate (TOPROL-XL) 25 MG 24 hr tablet; Take 1 tablet (25 mg total) by mouth 2 (two) times daily. -     pantoprazole (PROTONIX) 40 MG tablet; Take 1 tablet (40 mg total) by mouth daily.   I have discontinued Ms. Mavity's fluconazole and doxycycline. I have also changed her amLODipine. Additionally, I am having her maintain her aspirin, hydrochlorothiazide, metoprolol succinate, pantoprazole, and ibuprofen.  Meds ordered this encounter  Medications  . amLODipine (NORVASC) 5 MG tablet    Sig: Take 1 tablet (5 mg total) by mouth daily.    Dispense:  90 tablet    Refill:  3  . hydrochlorothiazide (HYDRODIURIL) 25 MG tablet    Sig: TAKE 1 TABLET BY MOUTH ONCE DAILY    Dispense:  90 tablet    Refill:  3  . metoprolol succinate (TOPROL-XL) 25 MG 24 hr tablet    Sig:  Take 1 tablet (25 mg total) by mouth 2 (two) times daily.    Dispense:  180 tablet    Refill:  1  . pantoprazole (PROTONIX) 40 MG tablet    Sig: Take 1 tablet (40 mg total) by mouth daily.    Dispense:  90 tablet    Refill:  3  . ibuprofen (ADVIL,MOTRIN) 800 MG tablet    Sig: Take 1 tablet (800 mg total) by mouth every 8 (eight) hours as needed.    Dispense:  65 tablet    Refill:  11   See AVS for instructions about healthy living and anticipatory guidance.  Follow-up: Return in about 6 months (around 05/12/2015).  Scarlette Calico, MD

## 2014-11-11 ENCOUNTER — Encounter: Payer: Self-pay | Admitting: Internal Medicine

## 2015-03-21 ENCOUNTER — Other Ambulatory Visit: Payer: Self-pay

## 2015-03-21 ENCOUNTER — Telehealth: Payer: Self-pay

## 2015-03-21 DIAGNOSIS — R1011 Right upper quadrant pain: Secondary | ICD-10-CM

## 2015-03-21 NOTE — Telephone Encounter (Signed)
Pt c/o RUQ pain. Per Dr. Ardis Hughs pt needs RUQ [place]. Pt scheduled for RUQ [place] at [facility] 03/25/15@8am , pt to arrive there at 7:45am. Pt to be NPO 6 hours prior to [place]. Pt aware.

## 2015-03-25 ENCOUNTER — Ambulatory Visit (HOSPITAL_COMMUNITY): Payer: 59

## 2015-06-02 MED FILL — PANTOPRAZOLE SOD DR 40 MG T: 40 | 90 days supply | Qty: 90 | Fill #2

## 2015-06-23 MED FILL — HYDROCHLOROTHIAZIDE 25 MG T: 25 | 90 days supply | Qty: 90 | Fill #2

## 2015-07-05 MED FILL — AMLODIPINE BESYLATE 5 MG TA: 5 | 90 days supply | Qty: 90 | Fill #2

## 2015-07-18 ENCOUNTER — Encounter (HOSPITAL_COMMUNITY): Payer: Self-pay | Admitting: Emergency Medicine

## 2015-07-18 ENCOUNTER — Emergency Department (HOSPITAL_COMMUNITY): Payer: 59

## 2015-07-18 ENCOUNTER — Emergency Department (HOSPITAL_COMMUNITY)
Admission: EM | Admit: 2015-07-18 | Discharge: 2015-07-18 | Disposition: A | Discharge status: Home / Self Care | Payer: 59 | Attending: Emergency Medicine | Admitting: Emergency Medicine

## 2015-07-18 DIAGNOSIS — I10 Essential (primary) hypertension: Secondary | ICD-10-CM | POA: Diagnosis not present

## 2015-07-18 DIAGNOSIS — Z79899 Other long term (current) drug therapy: Secondary | ICD-10-CM | POA: Diagnosis not present

## 2015-07-18 DIAGNOSIS — R05 Cough: Secondary | ICD-10-CM | POA: Diagnosis not present

## 2015-07-18 DIAGNOSIS — Z8639 Personal history of other endocrine, nutritional and metabolic disease: Secondary | ICD-10-CM | POA: Insufficient documentation

## 2015-07-18 DIAGNOSIS — R011 Cardiac murmur, unspecified: Secondary | ICD-10-CM | POA: Insufficient documentation

## 2015-07-18 DIAGNOSIS — Z7982 Long term (current) use of aspirin: Secondary | ICD-10-CM | POA: Diagnosis not present

## 2015-07-18 DIAGNOSIS — I4891 Unspecified atrial fibrillation: Secondary | ICD-10-CM | POA: Diagnosis not present

## 2015-07-18 DIAGNOSIS — Z880 Allergy status to penicillin: Secondary | ICD-10-CM | POA: Insufficient documentation

## 2015-07-18 DIAGNOSIS — R002 Palpitations: Secondary | ICD-10-CM | POA: Diagnosis not present

## 2015-07-18 DIAGNOSIS — R000 Tachycardia, unspecified: Secondary | ICD-10-CM | POA: Diagnosis not present

## 2015-07-18 HISTORY — DX: Cardiac murmur, unspecified: R01.1

## 2015-07-18 LAB — COMPREHENSIVE METABOLIC PANEL
ALK PHOS: 102 U/L (ref 38–126)
ALT: 23 U/L (ref 14–54)
ANION GAP: 12 (ref 5–15)
AST: 26 U/L (ref 15–41)
Albumin: 4.7 g/dL (ref 3.5–5.0)
BILIRUBIN TOTAL: 0.5 mg/dL (ref 0.3–1.2)
BUN: 14 mg/dL (ref 6–20)
CALCIUM: 9.5 mg/dL (ref 8.9–10.3)
CO2: 26 mmol/L (ref 22–32)
CREATININE: 0.71 mg/dL (ref 0.44–1.00)
Chloride: 105 mmol/L (ref 101–111)
GFR calc non Af Amer: >60 mL/min (ref >60)
GLUCOSE: 118 mg/dL — AB (ref 65–99)
Potassium: 3.6 mmol/L (ref 3.5–5.1)
Sodium: 143 mmol/L (ref 135–145)
TOTAL PROTEIN: 8.6 g/dL — AB (ref 6.5–8.1)

## 2015-07-18 LAB — TSH: TSH: 0.685 u[IU]/mL (ref 0.350–4.500)

## 2015-07-18 LAB — CBC WITH DIFFERENTIAL/PLATELET
Basophils Absolute: 0 10*3/uL (ref 0.0–0.1)
Basophils Relative: 0 %
Eosinophils Absolute: 0.1 10*3/uL (ref 0.0–0.7)
Eosinophils Relative: 1 %
HEMATOCRIT: 43.9 % (ref 36.0–46.0)
Hemoglobin: 13.9 g/dL (ref 12.0–15.0)
LYMPHS ABS: 2.1 10*3/uL (ref 0.7–4.0)
LYMPHS PCT: 19 %
MCH: 28.5 pg (ref 26.0–34.0)
MCHC: 31.7 g/dL (ref 30.0–36.0)
MCV: 90.1 fL (ref 78.0–100.0)
MONO ABS: 0.5 10*3/uL (ref 0.1–1.0)
MONOS PCT: 4 %
NEUTROS ABS: 8.6 10*3/uL — AB (ref 1.7–7.7)
Neutrophils Relative %: 76 %
Platelets: 410 10*3/uL — ABNORMAL HIGH (ref 150–400)
RBC: 4.87 MIL/uL (ref 3.87–5.11)
RDW: 13.3 % (ref 11.5–15.5)
WBC: 11.3 10*3/uL — ABNORMAL HIGH (ref 4.0–10.5)

## 2015-07-18 LAB — URINE MICROSCOPIC-ADD ON

## 2015-07-18 LAB — URINALYSIS, ROUTINE W REFLEX MICROSCOPIC
BILIRUBIN URINE: NEGATIVE
Glucose, UA: NEGATIVE mg/dL
HGB URINE DIPSTICK: NEGATIVE
KETONES UR: NEGATIVE mg/dL
NITRITE: NEGATIVE
PH: 7 (ref 5.0–8.0)
Protein, ur: NEGATIVE mg/dL
Specific Gravity, Urine: 1.006 (ref 1.005–1.030)

## 2015-07-18 LAB — D-DIMER, QUANTITATIVE: D-Dimer, Quant: 0.93 ug/mL-FEU — ABNORMAL HIGH (ref 0.00–0.50)

## 2015-07-18 LAB — MAGNESIUM: Magnesium: 2 mg/dL (ref 1.7–2.4)

## 2015-07-18 LAB — T4, FREE: FREE T4: 0.87 ng/dL (ref 0.61–1.12)

## 2015-07-18 MED ORDER — SODIUM CHLORIDE 0.9 % IV BOLUS (SEPSIS)
500.0000 mL | Freq: Once | INTRAVENOUS | Status: AC
  Administered 2015-07-18: 500 mL via INTRAVENOUS

## 2015-07-18 MED ORDER — METOPROLOL TARTRATE 1 MG/ML IV SOLN
5.0000 mg | Freq: Once | INTRAVENOUS | Status: AC
  Administered 2015-07-18: 5 mg via INTRAVENOUS
  Filled 2015-07-18: qty 5

## 2015-07-18 MED ORDER — IOHEXOL 350 MG/ML SOLN
100.0000 mL | Freq: Once | INTRAVENOUS | Status: AC | PRN
  Administered 2015-07-18: 100 mL via INTRAVENOUS

## 2015-07-18 NOTE — ED Notes (Signed)
Pt reports recent URI and passing out 2/14; tachycardia in triage; hx of heart murmur; denies other cardiac hx.

## 2015-07-18 NOTE — ED Notes (Signed)
MD at bedside. 

## 2015-07-18 NOTE — ED Provider Notes (Signed)
CSN: [id number]     Arrival date & time 07/18/15  1039 History   First MD Initiated Contact with Patient 07/18/15 1111     Chief Complaint  Patient presents with  . Tachycardia     (Consider location/radiation/quality/duration/timing/severity/associated sxs/prior Treatment) HPI Comments: Htn, hlpd, heart murmur Cough for 2 weeks, improved Severe fatigue No shortness of breath No CP No n/v Generalized weakness for about 1 week Last week subject fevers, lightheaded, presyncope, now no fevers  Has lost weight with exercise, decreased metoprolol in last 2 weeks Had coffee this AM No over the counter meds, no weight loss meds, no drug use Mom has hx of atrial fibrillation      Decreased metoprolol to once per day over last 2 weeks  Past Medical History  Diagnosis Date  . Hypertension   . Hyperlipidemia   . Heart murmur    Past Surgical History  Procedure Laterality Date  . Cesarean section      x2   Family History  Problem Relation Age of Onset  . Stroke Mother   . Hypertension Mother   . Hyperlipidemia Mother   . Thyroid disease Mother   . Cancer Father     prostate  . Diabetes Neg Hx   . Depression Neg Hx   . Early death Neg Hx   . Hearing loss Neg Hx   . Heart disease Neg Hx   . Kidney disease Neg Hx   . Alcohol abuse Neg Hx   . Colon cancer Maternal Aunt    Social History  Substance Use Topics  . Smoking status: Never Smoker   . Smokeless tobacco: Never Used  . Alcohol Use: No   OB History    No data available     Review of Systems  Constitutional: Positive for fatigue. Negative for fever.  HENT: Negative for congestion and sore throat.   Eyes: Negative for visual disturbance.  Respiratory: Positive for cough. Negative for shortness of breath.   Cardiovascular: Negative for chest pain and leg swelling.  Gastrointestinal: Negative for nausea, vomiting, abdominal pain and blood in stool.  Genitourinary: Negative for vaginal bleeding, vaginal  discharge and difficulty urinating.  Musculoskeletal: Negative for back pain and neck pain.  Skin: Negative for rash.  Neurological: Negative for syncope and headaches.      Allergies  Lovastatin and Penicillins  Home Medications   Prior to Admission medications   Medication Sig Start Date End Date Taking? Authorizing Provider  amLODipine (NORVASC) 5 MG tablet Take 1 tablet (5 mg total) by mouth daily. 11/10/14  Yes Janith Lima, MD  aspirin EC 81 MG tablet Take 81 mg by mouth daily.   Yes Historical Provider, MD  hydrochlorothiazide (HYDRODIURIL) 25 MG tablet TAKE 1 TABLET BY MOUTH ONCE DAILY 11/10/14  Yes Janith Lima, MD  metoprolol succinate (TOPROL-XL) 25 MG 24 hr tablet Take 1 tablet (25 mg total) by mouth 2 (two) times daily. Patient taking differently: Take 12.5-25 mg by mouth 2 (two) times daily. Takes 25m in the morning and 12.517min the evening 11/10/14  Yes ThJanith LimaMD  pantoprazole (PROTONIX) 40 MG tablet Take 1 tablet (40 mg total) by mouth daily. 11/10/14  Yes ThJanith LimaMD   BP 109/77 mmHg  Pulse 81  Temp(Src) 98.3 F (36.8 C) (Oral)  Resp 16  Ht 5' 5"  (1.651 m)  Wt 162 lb (73.483 kg)  BMI 26.96 kg/m2  SpO2 100% Physical Exam  Constitutional: She is oriented  to person, place, and time. She appears well-developed and well-nourished. No distress.  HENT:  Head: Normocephalic and atraumatic.  Eyes: Conjunctivae and EOM are normal.  Neck: Normal range of motion.  Cardiovascular: Normal heart sounds and intact distal pulses.  An irregularly irregular rhythm present. Tachycardia present.  Exam reveals no gallop and no friction rub.   No murmur heard. Pulmonary/Chest: Effort normal and breath sounds normal. No respiratory distress. She has no wheezes. She has no rales.  Abdominal: Soft. She exhibits no distension. There is no tenderness. There is no guarding.  Musculoskeletal: She exhibits no edema or tenderness.  Neurological: She is alert and oriented  to person, place, and time.  Skin: Skin is warm and dry. No rash noted. She is not diaphoretic. No erythema.  Nursing note and vitals reviewed.   ED Course  Procedures (including critical care time) Labs Review Labs Reviewed  CBC WITH DIFFERENTIAL/PLATELET - Abnormal; Notable for the following:    WBC 11.3 (*)    Platelets 410 (*)    Neutro Abs 8.6 (*)    All other components within normal limits  COMPREHENSIVE METABOLIC PANEL - Abnormal; Notable for the following:    Glucose, Bld 118 (*)    Total Protein 8.6 (*)    All other components within normal limits  URINALYSIS, ROUTINE W REFLEX MICROSCOPIC (NOT AT [facility]) - Abnormal; Notable for the following:    APPearance CLOUDY (*)    Leukocytes, UA SMALL (*)    All other components within normal limits  D-DIMER, QUANTITATIVE (NOT AT [facility]) - Abnormal; Notable for the following:    D-Dimer, Quant 0.93 (*)    All other components within normal limits  URINE MICROSCOPIC-ADD ON - Abnormal; Notable for the following:    Squamous Epithelial / LPF 6-30 (*)    Bacteria, UA FEW (*)    All other components within normal limits  URINE CULTURE  MAGNESIUM  TSH  T4, FREE    Imaging Review Dg Chest 2 View  07/18/2015  CLINICAL DATA:  States recent URI last week, but this a.m, started having rapid heart rate, hx of heart murmur, no other chest complaints EXAM: CHEST - 2 VIEW COMPARISON:  None available FINDINGS: Lungs are clear. Heart size and mediastinal contours are within normal limits. No effusion. Visualized skeletal structures are unremarkable. IMPRESSION: No acute cardiopulmonary disease. Electronically Signed   By: Lucrezia Europe M.D.   On: 07/18/2015 12:29   Ct Angio Chest Pe W/cm &/or Wo Cm  07/18/2015  CLINICAL DATA:  Syncopal episode with tachycardia. History of heart murmur. Elevated D-dimer. EXAM: CT ANGIOGRAPHY CHEST WITH CONTRAST TECHNIQUE: Multidetector CT imaging of the chest was performed using the standard protocol during bolus  administration of intravenous contrast. Multiplanar CT image reconstructions and MIPs were obtained to evaluate the vascular anatomy. CONTRAST:  177m OMNIPAQUE IOHEXOL 350 MG/ML SOLN COMPARISON:  Chest x-ray 07/18/2015 FINDINGS: Mediastinum/Nodes: No breast masses, supraclavicular or axillary lymphadenopathy. Small scattered lymph nodes are noted. There is a simple appearing cyst noted in the left breast. The thyroid gland is grossly in. The heart is normal in size. No pericardial effusion. The aorta is normal in caliber. No dissection. Scattered atherosclerotic calcifications. The branch vessels are patent. Scattered coronary artery calcifications. The pulmonary arterial tree is well opacified. No filling defects to suggest pulmonary emboli. No mediastinal or hilar mass or lymphadenopathy. Small scattered lymph nodes are noted. The esophagus is grossly normal. Lungs/Pleura: No acute pulmonary findings. No interstitial lung disease or bronchiectasis.  Minimal dependent atelectasis. No edema, infiltrates or effusions. No worrisome pulmonary lesions. Upper abdomen: No significant upper abdominal findings. Musculoskeletal: No significant bony findings. Mild degenerative changes involving the mid thoracic spine. No worrisome bone lesions. Review of the MIP images confirms the above findings. IMPRESSION: 1. No CT findings for pulmonary embolism. 2. Normal thoracic aorta except for scattered atherosclerotic calcifications. 3. No mediastinal or hilar mass or adenopathy. 4. No acute pulmonary findings. Electronically Signed   By: Marijo Sanes M.D.   On: 07/18/2015 13:51   I have personally reviewed and evaluated these images and lab results as part of my medical decision-making.   EKG Interpretation   Date/Time:  Monday July 18 2015 13:58:28 EST Ventricular Rate:  76 PR Interval:  171 QRS Duration: 91 QT Interval:  370 QTC Calculation: 416 R Axis:   30 Text Interpretation:  Sinus rhythm Low voltage,  precordial leads Since  previous ECG, patient has converted to sinus rhythm and rate has decreased  Confirmed by George ([id number]) on 07/18/2015 9:04:48 PM      MDM   Final diagnoses:  Atrial fibrillation with RVR ([place])   60yo female with history of hypertension, hyperlipidemia, heart murmur, presents with concern for fatigue and tachycardia.  Patient in atrial fibrillation with RVR on arrival to ED, no prior history of same.  Unknown duration of time in atrial fibrillation. BP stable.  Likely trigger is recent URI, possible caffeine use today. No other new medications or over the counter meds. TSH WNL. No sign of pneumonia on CXR.  DDimer positive and CTA done given new onset atrial fibrillation with near-syncope last week and showed no sign of PE. No CP/SOB and doubt ACS. Pt on metoprolol for BP and recently decreased dose.  Given 500cc NS and 41m IV metoprolol with conversion back to sinus rhythm.  Pt CHADS2 1, CHADS2VASC 2 given female with htn, however given atrial fibrillation in setting of recent acute illness will defer treatment at this time to PCP and Cardiology. Patient is scheduled to see PCP tomorrow. Discussed again increasing metoprolol to BID for tonight and discussing further medication changes with PCP tomorrow.  Patient in agreement with plan.  Provided number for Cardiology follow up. Patient discharged in stable condition with understanding of reasons to return.   EGareth Morgan MD 07/18/15 2122

## 2015-07-18 NOTE — ED Notes (Signed)
Patient transported to CT 

## 2015-07-18 NOTE — Discharge Instructions (Signed)

## 2015-07-18 NOTE — ED Notes (Signed)
MD at bedside [facility]

## 2015-07-19 ENCOUNTER — Ambulatory Visit (INDEPENDENT_AMBULATORY_CARE_PROVIDER_SITE_OTHER): Payer: 59 | Admitting: Internal Medicine

## 2015-07-19 ENCOUNTER — Encounter: Payer: Self-pay | Admitting: Internal Medicine

## 2015-07-19 VITALS — BP 100/64 | HR 67 | Temp 98.7°F | Resp 16 | Ht 65.0 in | Wt 161.0 lb

## 2015-07-19 DIAGNOSIS — I10 Essential (primary) hypertension: Secondary | ICD-10-CM

## 2015-07-19 DIAGNOSIS — R000 Tachycardia, unspecified: Secondary | ICD-10-CM

## 2015-07-19 DIAGNOSIS — I480 Paroxysmal atrial fibrillation: Secondary | ICD-10-CM | POA: Diagnosis not present

## 2015-07-19 NOTE — Progress Notes (Signed)
Pre visit review using our clinic review tool, if applicable. No additional management support is needed unless otherwise documented below in the visit note. 

## 2015-07-19 NOTE — Progress Notes (Signed)
Subjective:  Patient ID: Julie Clark, female    DOB: 1955/06/13  Age: 60 y.o. MRN: [id number]  CC: Atrial Fibrillation   HPI Julie Clark presents for a follow-up after being seen in the emergency room yesterday for tachycardia and atrial fibrillation. She tells me that she had been feeling weak and fatigued for about 2 weeks and decided to cut her beta blocker dose in half. She was at work yesterday when she had an EKG done that showed that she was in atrial fibrillation with rapid ventricular response. She was seen in emergency room and given an IV dose of a beta blocker and quickly converted back into sinus rhythm. She thinks she has stayed in sinus rhythm and she has had no recent palpitations.  Outpatient Prescriptions Prior to Visit  Medication Sig Dispense Refill  . amLODipine (NORVASC) 5 MG tablet Take 1 tablet (5 mg total) by mouth daily. 90 tablet 3  . aspirin EC 81 MG tablet Take 81 mg by mouth daily.    . metoprolol succinate (TOPROL-XL) 25 MG 24 hr tablet Take 1 tablet (25 mg total) by mouth 2 (two) times daily. (Patient taking differently: Take 12.5-25 mg by mouth 2 (two) times daily. Takes 3m in the morning and 12.526min the evening) 180 tablet 1  . pantoprazole (PROTONIX) 40 MG tablet Take 1 tablet (40 mg total) by mouth daily. 90 tablet 3  . hydrochlorothiazide (HYDRODIURIL) 25 MG tablet TAKE 1 TABLET BY MOUTH ONCE DAILY 90 tablet 3   No facility-administered medications prior to visit.    ROS Review of Systems  Constitutional: Positive for fatigue. Negative for activity change, appetite change and unexpected weight change.  HENT: Negative.   Eyes: Negative.   Respiratory: Negative.  Negative for cough, choking, chest tightness, shortness of breath and stridor.   Cardiovascular: Negative.  Negative for chest pain, palpitations and leg swelling.  Gastrointestinal: Negative.  Negative for nausea, vomiting, abdominal pain, diarrhea, constipation and blood in stool.    Endocrine: Negative.   Genitourinary: Negative.   Musculoskeletal: Negative.   Skin: Negative.   Allergic/Immunologic: Negative.   Neurological: Positive for weakness. Negative for dizziness, syncope, light-headedness and numbness.  Hematological: Negative.  Negative for adenopathy. Does not bruise/bleed easily.  Psychiatric/Behavioral: Negative.     Objective:  BP 100/64 mmHg  Pulse 67  Temp(Src) 98.7 F (37.1 C) (Oral)  Resp 16  Ht 5' 5"  (1.651 m)  Wt 161 lb (73.029 kg)  BMI 26.79 kg/m2  SpO2 97%  BP Readings from Last 3 Encounters:  07/19/15 100/64  07/18/15 109/77  11/10/14 122/68    Wt Readings from Last 3 Encounters:  07/19/15 161 lb (73.029 kg)  07/18/15 162 lb (73.483 kg)  11/10/14 184 lb (83.462 kg)    Physical Exam  Constitutional: She is oriented to person, place, and time. No distress.  HENT:  Mouth/Throat: Oropharynx is clear and moist. No oropharyngeal exudate.  Eyes: Conjunctivae are normal. Right eye exhibits no discharge. Left eye exhibits no discharge. No scleral icterus.  Neck: Normal range of motion. Neck supple. No JVD present. No tracheal deviation present. No thyromegaly present.  Cardiovascular: Normal rate, regular rhythm, S1 normal, S2 normal and intact distal pulses.  Exam reveals no gallop and no friction rub.   Murmur heard.  Decrescendo systolic murmur is present with a grade of 1/6   No diastolic murmur is present  Pulses:      Carotid pulses are 1+ on the right side, and  1+ on the left side.      Radial pulses are 1+ on the right side, and 1+ on the left side.       Femoral pulses are 1+ on the right side, and 1+ on the left side.      Popliteal pulses are 1+ on the right side, and 1+ on the left side.       Dorsalis pedis pulses are 1+ on the right side, and 1+ on the left side.       Posterior tibial pulses are 1+ on the right side, and 1+ on the left side.  EKG - NSR, normal EKG  Pulmonary/Chest: Effort normal and breath sounds  normal. No stridor. No respiratory distress. She has no wheezes. She has no rales. She exhibits no tenderness.  Abdominal: Soft. Bowel sounds are normal. She exhibits no distension and no mass. There is no tenderness. There is no rebound and no guarding.  Musculoskeletal: Normal range of motion. She exhibits no edema or tenderness.  Lymphadenopathy:    She has no cervical adenopathy.  Neurological: She is oriented to person, place, and time.  Skin: Skin is warm and dry. No rash noted. She is not diaphoretic. No erythema. No pallor.  Psychiatric: She has a normal mood and affect. Her behavior is normal. Judgment and thought content normal.  Vitals reviewed.   Lab Results  Component Value Date   WBC 11.3* 07/18/2015   HGB 13.9 07/18/2015   HCT 43.9 07/18/2015   PLT 410* 07/18/2015   GLUCOSE 118* 07/18/2015   CHOL 218* 11/10/2014   TRIG 66.0 11/10/2014   HDL 43.50 11/10/2014   LDLCALC 161* 11/10/2014   ALT 23 07/18/2015   AST 26 07/18/2015   NA 143 07/18/2015   K 3.6 07/18/2015   CL 105 07/18/2015   CREATININE 0.71 07/18/2015   BUN 14 07/18/2015   CO2 26 07/18/2015   TSH 0.685 07/18/2015    Dg Chest 2 View  07/18/2015  CLINICAL DATA:  States recent URI last week, but this a.m, started having rapid heart rate, hx of heart murmur, no other chest complaints EXAM: CHEST - 2 VIEW COMPARISON:  None available FINDINGS: Lungs are clear. Heart size and mediastinal contours are within normal limits. No effusion. Visualized skeletal structures are unremarkable. IMPRESSION: No acute cardiopulmonary disease. Electronically Signed   By: Lucrezia Europe M.D.   On: 07/18/2015 12:29   Ct Angio Chest Pe W/cm &/or Wo Cm  07/18/2015  CLINICAL DATA:  Syncopal episode with tachycardia. History of heart murmur. Elevated D-dimer. EXAM: CT ANGIOGRAPHY CHEST WITH CONTRAST TECHNIQUE: Multidetector CT imaging of the chest was performed using the standard protocol during bolus administration of intravenous contrast.  Multiplanar CT image reconstructions and MIPs were obtained to evaluate the vascular anatomy. CONTRAST:  162m OMNIPAQUE IOHEXOL 350 MG/ML SOLN COMPARISON:  Chest x-ray 07/18/2015 FINDINGS: Mediastinum/Nodes: No breast masses, supraclavicular or axillary lymphadenopathy. Small scattered lymph nodes are noted. There is a simple appearing cyst noted in the left breast. The thyroid gland is grossly in. The heart is normal in size. No pericardial effusion. The aorta is normal in caliber. No dissection. Scattered atherosclerotic calcifications. The branch vessels are patent. Scattered coronary artery calcifications. The pulmonary arterial tree is well opacified. No filling defects to suggest pulmonary emboli. No mediastinal or hilar mass or lymphadenopathy. Small scattered lymph nodes are noted. The esophagus is grossly normal. Lungs/Pleura: No acute pulmonary findings. No interstitial lung disease or bronchiectasis. Minimal dependent atelectasis. No edema,  infiltrates or effusions. No worrisome pulmonary lesions. Upper abdomen: No significant upper abdominal findings. Musculoskeletal: No significant bony findings. Mild degenerative changes involving the mid thoracic spine. No worrisome bone lesions. Review of the MIP images confirms the above findings. IMPRESSION: 1. No CT findings for pulmonary embolism. 2. Normal thoracic aorta except for scattered atherosclerotic calcifications. 3. No mediastinal or hilar mass or adenopathy. 4. No acute pulmonary findings. Electronically Signed   By: Marijo Sanes M.D.   On: 07/18/2015 13:51    Assessment & Plan:   Tekila was seen today for atrial fibrillation.  Diagnoses and all orders for this visit:  Paroxysmal atrial fibrillation (Martorell)- she is in sinus rhythm, I have ordered a cardiac event monitor to check the burden of the atrial fibrillation, she has a CHA2DS2-VASc score of 2 but is not willing to be anticoagulated at this time, she wants to follow-up with  cardiology -     Cardiac event monitor; Future  Essential hypertension, benign- her systolic blood pressures down to 100 so I've asked her to discontinue the diuretic, in light of the recent episode of atrial fibrillation I've asked her to continue the calcium channel blocker and beta blocker for rate and rhythm control.  Increased heart rate -     EKG 12-Lead  I have discontinued Ms. Mcglown's hydrochlorothiazide. I am also having her maintain her amLODipine, metoprolol succinate, pantoprazole, and aspirin EC.  No orders of the defined types were placed in this encounter.     Follow-up: Return in about 6 weeks (around 08/30/2015).  Scarlette Calico, MD

## 2015-07-19 NOTE — Patient Instructions (Signed)

## 2015-07-20 LAB — URINE CULTURE

## 2015-07-22 ENCOUNTER — Encounter: Payer: Self-pay | Admitting: Cardiology

## 2015-07-22 ENCOUNTER — Ambulatory Visit (INDEPENDENT_AMBULATORY_CARE_PROVIDER_SITE_OTHER): Payer: 59 | Admitting: Cardiology

## 2015-07-22 VITALS — BP 122/74 | HR 70 | Ht 65.0 in | Wt 163.2 lb

## 2015-07-22 DIAGNOSIS — I480 Paroxysmal atrial fibrillation: Secondary | ICD-10-CM

## 2015-07-22 NOTE — Patient Instructions (Addendum)
Medication Instructions:  Your physician recommends that you continue on your current medications as directed. Please refer to the Current Medication list given to you today.  Labwork: None ordered  Testing/Procedures: None ordered  Follow-Up: Your physician recommends that you schedule a follow-up appointment in: 3 months with Dr. Curt Bears.  Any Other Special Instructions Will Be Listed Below (If Applicable). These are the NOACs (anticoagulants): Please call the office if/when you decide to start an anticoagulant. Eliquis Xarelto Pradaxa Savaysa  Atrial Fibrillation Atrial fibrillation is a type of irregular or rapid heartbeat (arrhythmia). In atrial fibrillation, the heart quivers continuously in a chaotic pattern. This occurs when parts of the heart receive disorganized signals that make the heart unable to pump blood normally. This can increase the risk for stroke, heart failure, and other heart-related conditions. There are different types of atrial fibrillation, including:  Paroxysmal atrial fibrillation. This type starts suddenly, and it usually stops on its own shortly after it starts.  Persistent atrial fibrillation. This type often lasts longer than a week. It may stop on its own or with treatment.  Long-lasting persistent atrial fibrillation. This type lasts longer than 12 months.  Permanent atrial fibrillation. This type does not go away. Talk with your health care provider to learn about the type of atrial fibrillation that you have. CAUSES This condition is caused by some heart-related conditions or procedures, including:  A heart attack.  Coronary artery disease.  Heart failure.  Heart valve conditions.  High blood pressure.  Inflammation of the sac that surrounds the heart (pericarditis).  Heart surgery.  Certain heart rhythm disorders, such as Wolf-Parkinson-White syndrome. Other causes include:  Pneumonia.  Obstructive sleep apnea.  Blockage of  an artery in the lungs (pulmonary embolism, or PE).  Lung cancer.  Chronic lung disease.  Thyroid problems, especially if the thyroid is overactive (hyperthyroidism).  Caffeine.  Excessive alcohol use or illegal drug use.  Use of some medicines, including certain decongestants and diet pills. Sometimes, the cause cannot be found. RISK FACTORS This condition is more likely to develop in:  People who are older in age.  People who smoke.  People who have diabetes mellitus.  People who are overweight (obese).  Athletes who exercise vigorously. SYMPTOMS Symptoms of this condition include:  A feeling that your heart is beating rapidly or irregularly.  A feeling of discomfort or pain in your chest.  Shortness of breath.  Sudden light-headedness or weakness.  Getting tired easily during exercise. In some cases, there are no symptoms. DIAGNOSIS Your health care provider may be able to detect atrial fibrillation when taking your pulse. If detected, this condition may be diagnosed with:  An electrocardiogram (ECG).  A Holter monitor test that records your heartbeat patterns over a 24-hour period.  Transthoracic echocardiogram (TTE) to evaluate how blood flows through your heart.  Transesophageal echocardiogram (TEE) to view more detailed images of your heart.  A stress test.  Imaging tests, such as a CT scan or chest X-ray.  Blood tests. TREATMENT The main goals of treatment are to prevent blood clots from forming and to keep your heart beating at a normal rate and rhythm. The type of treatment that you receive depends on many factors, such as your underlying medical conditions and how you feel when you are experiencing atrial fibrillation. This condition may be treated with:  Medicine to slow down the heart rate, bring the heart's rhythm back to normal, or prevent clots from forming.  Electrical cardioversion. This is a  procedure that resets your heart's rhythm by  delivering a controlled, low-energy shock to the heart through your skin.  Different types of ablation, such as catheter ablation, catheter ablation with pacemaker, or surgical ablation. These procedures destroy the heart tissues that send abnormal signals. When the pacemaker is used, it is placed under your skin to help your heart beat in a regular rhythm. HOME CARE INSTRUCTIONS  Take over-the counter and prescription medicines only as told by your health care provider.  If your health care provider prescribed a blood-thinning medicine (anticoagulant), take it exactly as told. Taking too much blood-thinning medicine can cause bleeding. If you do not take enough blood-thinning medicine, you will not have the protection that you need against stroke and other problems.  Do not use tobacco products, including cigarettes, chewing tobacco, and e-cigarettes. If you need help quitting, ask your health care provider.  If you have obstructive sleep apnea, manage your condition as told by your health care provider.  Do not drink alcohol.  Do not drink beverages that contain caffeine, such as coffee, soda, and tea.  Maintain a healthy weight. Do not use diet pills unless your health care provider approves. Diet pills may make heart problems worse.  Follow diet instructions as told by your health care provider.  Exercise regularly as told by your health care provider.  Keep all follow-up visits as told by your health care provider. This is important. PREVENTION  Avoid drinking beverages that contain caffeine or alcohol.  Avoid certain medicines, especially medicines that are used for breathing problems.  Avoid certain herbs and herbal medicines, such as those that contain ephedra or ginseng.  Do not use illegal drugs, such as cocaine and amphetamines.  Do not smoke.  Manage your high blood pressure. SEEK MEDICAL CARE IF:  You notice a change in the rate, rhythm, or strength of your  heartbeat.  You are taking an anticoagulant and you notice increased bruising.  You tire more easily when you exercise or exert yourself. SEEK IMMEDIATE MEDICAL CARE IF:  You have chest pain, abdominal pain, sweating, or weakness.  You feel nauseous.  You notice blood in your vomit, bowel movement, or urine.  You have shortness of breath.  You suddenly have swollen feet and ankles.  You feel dizzy.  You have sudden weakness or numbness of the face, arm, or leg, especially on one side of the body.  You have trouble speaking, trouble understanding, or both (aphasia).  Your face or your eyelid droops on one side. These symptoms may represent a serious problem that is an emergency. Do not wait to see if the symptoms will go away. Get medical help right away. Call your local emergency services (911 in the [place] Do not drive yourself to the hospital.   This information is not intended to replace advice given to you by your health care provider. Make sure you discuss any questions you have with your health care provider.   Document Released: 05/14/2005 Document Revised: 02/02/2015 Document Reviewed: 09/08/2014 Elsevier Interactive Patient Education [employer].

## 2015-07-22 NOTE — Progress Notes (Signed)
Electrophysiology Office Note   Date:  07/22/2015   ID:  Julie Clark, DOB 1955-08-15, MRN [id number]  PCP:  Scarlette Calico, MD  Primary Electrophysiologist:  Shawnice Tilmon Meredith Leeds, MD    Chief Complaint  Patient presents with  . Atrial Fibrillation     History of Present Illness: Julie Clark is a 60 y.o. female who presents today for electrophysiology evaluation.    She has a history of hypertension, hyperlipidemia and she has heart murmur. She presented to the emergency room earlier this week with low blood pressure. 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 She was sent to the emergency room at that time. She was found to be in atrial fibrillation. She was given beta blockers which converted to sinus rhythm. She had no symptoms of shortness of breath , fatigue, or weakness. She says that she feels well today. She has seen her PCP who ordered a 48 hour monitor but the patient refused, as she wore a monitor 3 years ago and the patches bother her skin.   Today, she denies symptoms of palpitations, chest pain, shortness of breath, orthopnea, PND, lower extremity edema, claudication, dizziness, presyncope, syncope, bleeding, or neurologic sequela. The patient is tolerating medications without difficulties and is otherwise without complaint today.    Past Medical History  Diagnosis Date  . Hypertension   . Hyperlipidemia   . Heart murmur    Past Surgical History  Procedure Laterality Date  . Cesarean section      x2     Current Outpatient Prescriptions  Medication Sig Dispense Refill  . amLODipine (NORVASC) 5 MG tablet Take 1 tablet (5 mg total) by mouth daily. 90 tablet 3  . aspirin EC 81 MG tablet Take 81 mg by mouth daily.    Marland Kitchen ibuprofen (ADVIL,MOTRIN) 800 MG tablet Take 800 mg by mouth every 8 (eight) hours as needed.    . metoprolol succinate (TOPROL-XL)  25 MG 24 hr tablet Take 1 tablet (25 mg total) by mouth 2 (two) times daily. 180 tablet 1  . pantoprazole (PROTONIX) 40 MG tablet Take 1 tablet (40 mg total) by mouth daily. 90 tablet 3   No current facility-administered medications for this visit.    Allergies:   Lovastatin and Penicillins   Social History:  The patient  reports that she has never smoked. She has never used smokeless tobacco. She reports that she does not drink alcohol or use illicit drugs.   Family History:  The patient's family history includes Cancer in her father; Colon cancer in her maternal aunt; Hyperlipidemia in her mother; Hypertension in her mother; Stroke in her mother; Thyroid disease in her mother. There is no history of Diabetes, Depression, Early death, Hearing loss, Heart disease, Kidney disease, or Alcohol abuse.    ROS:  Please see the history of present illness.   All other systems are reviewed and negative.    PHYSICAL EXAM: VS:  BP 122/74 mmHg  Pulse 70  Ht 5' 5"  (1.651 m)  Wt 163 lb 3.2 oz (74.027 kg)  BMI 27.16 kg/m2 , BMI Body mass index is 27.16 kg/(m^2). GEN: Well nourished, well developed, in no acute distress HEENT: normal Neck: no JVD, carotid bruits, or masses Cardiac: RRR; 2/6 systolic murmur at the base, no rubs, or gallops,no edema  Respiratory:  clear to  auscultation bilaterally, normal work of breathing GI: soft, nontender, nondistended, + BS MS: no deformity or atrophy Skin: warm and dry,  device pocket is well healed Neuro:  Strength and sensation are intact Psych: euthymic mood, full affect  EKG:  EKG is not ordered today. The ekg ordered today shows   Recent Labs: 07/18/2015: ALT 23; BUN 14; Creatinine, Ser 0.71; Hemoglobin 13.9; Magnesium 2.0; Platelets 410*; Potassium 3.6; Sodium 143; TSH 0.685    Lipid Panel     Component Value Date/Time   CHOL 218* 11/10/2014 0920   TRIG 66.0 11/10/2014 0920   HDL 43.50 11/10/2014 0920   CHOLHDL 5 11/10/2014 0920   VLDL 13.2  11/10/2014 0920   LDLCALC 161* 11/10/2014 0920     Wt Readings from Last 3 Encounters:  07/22/15 163 lb 3.2 oz (74.027 kg)  07/19/15 161 lb (73.029 kg)  07/18/15 162 lb (73.483 kg)      Other studies Reviewed: Additional studies/ records that were reviewed today include: TTE 10/09/15  Review of the above records today demonstrates:  - Left ventricle: The cavity size was normal. Wall thickness was normal. Systolic function was normal. The estimated ejection fraction was in the range of 55% to 60%. Wall motion was normal; there were no regional wall motion abnormalities. Doppler parameters are consistent with abnormal left ventricular relaxation (grade 1 diastolic dysfunction). - Aortic valve: Mean gradient: 79m Hg (S). Peak gradient: 18mHg (S). - Left atrium: The atrium was mildly dilated. - Pulmonary arteries: PA peak pressure: 3220mg (S).   ASSESSMENT AND PLAN:  1.   Paroxysmal atrial fibrillation:  Presented the emergency room in atrial fibrillation, and converted with beta blockers. I discussed with her the risks of stroke and atrial fibrillation. She has a CHADS2VASc score of 2 , and therefore would benefit from anticoagulation. She does not wish to pursue this at this time. I have given her information on atrial fibrillation as well as anticoagulants which she Julie Clark read. I have also suggested that she would need a TTE to further define her cardiac anatomy but she wishes to wait for this as well. We Tyger Wichman see her back in 3 months to further discuss her atrial fibrillation.    Current medicines are reviewed at length with the patient today.   The patient does not have concerns regarding her medicines.  The following changes were made today:  none  Labs/ tests ordered today include:  No orders of the defined types were placed in this encounter.     Disposition:   FU with Alonnie Bieker 3 months  Signed, Cayson Kalb MarMeredith LeedsD  07/22/2015 9:34 AM     [facility] [address] [place] [phone]) ([phone])

## 2015-08-26 MED FILL — PANTOPRAZOLE SOD DR 40 MG T: 40 | 90 days supply | Qty: 90 | Fill #3

## 2015-09-22 ENCOUNTER — Other Ambulatory Visit: Payer: Self-pay

## 2015-09-22 DIAGNOSIS — Z1231 Encounter for screening mammogram for malignant neoplasm of breast: Secondary | ICD-10-CM

## 2015-10-03 ENCOUNTER — Ambulatory Visit: Payer: 59

## 2015-10-05 ENCOUNTER — Telehealth: Payer: Self-pay

## 2015-10-05 MED ORDER — METOPROLOL SUCCINATE ER 25 MG PO TB24: 25.0000 mg | ORAL_TABLET | Freq: Two times a day (BID) | ORAL | Status: DC

## 2015-10-05 MED ORDER — AMLODIPINE BESYLATE 5 MG PO TABS: 5.0000 mg | ORAL_TABLET | Freq: Every day | ORAL | Status: DC

## 2015-10-05 MED ORDER — PANTOPRAZOLE SODIUM 40 MG PO TBEC: 40.0000 mg | DELAYED_RELEASE_TABLET | Freq: Every day | ORAL | Status: DC

## 2015-10-05 MED FILL — METOPROLOL SUCC ER 25 MG TA: 25 | 90 days supply | Qty: 180 | Fill #1

## 2015-10-05 MED FILL — AMLODIPINE BESYLATE 5 MG TA: 5 | 90 days supply | Qty: 90 | Fill #3

## 2015-10-05 MED FILL — IBUPROFEN 800 MG TABLET: 800 | 21 days supply | Qty: 65 | Fill #3

## 2015-10-05 NOTE — Telephone Encounter (Signed)
Pt called for maintenance meds to be refilled. erx sent to [facility].  Pt informed that CPE (10/2015) and 6 week follow up (past due) and that an appt is needed.

## 2015-10-19 ENCOUNTER — Encounter: Payer: Self-pay | Admitting: Internal Medicine

## 2015-10-19 ENCOUNTER — Ambulatory Visit (INDEPENDENT_AMBULATORY_CARE_PROVIDER_SITE_OTHER): Payer: 59 | Admitting: Internal Medicine

## 2015-10-19 VITALS — BP 110/60 | HR 67 | Temp 98.6°F | Resp 16 | Ht 65.0 in | Wt 164.0 lb

## 2015-10-19 DIAGNOSIS — L732 Hidradenitis suppurativa: Secondary | ICD-10-CM | POA: Diagnosis not present

## 2015-10-19 DIAGNOSIS — Z1231 Encounter for screening mammogram for malignant neoplasm of breast: Secondary | ICD-10-CM

## 2015-10-19 DIAGNOSIS — R011 Cardiac murmur, unspecified: Secondary | ICD-10-CM | POA: Diagnosis not present

## 2015-10-19 DIAGNOSIS — M170 Bilateral primary osteoarthritis of knee: Secondary | ICD-10-CM | POA: Diagnosis not present

## 2015-10-19 DIAGNOSIS — Z0000 Encounter for general adult medical examination without abnormal findings: Secondary | ICD-10-CM

## 2015-10-19 DIAGNOSIS — I480 Paroxysmal atrial fibrillation: Secondary | ICD-10-CM

## 2015-10-19 DIAGNOSIS — I10 Essential (primary) hypertension: Secondary | ICD-10-CM | POA: Diagnosis not present

## 2015-10-19 MED ORDER — MINOCYCLINE HCL 50 MG PO TABS: 50.0000 mg | ORAL_TABLET | Freq: Two times a day (BID) | ORAL | Status: DC

## 2015-10-19 MED ORDER — IBUPROFEN 800 MG PO TABS: 800.0000 mg | ORAL_TABLET | Freq: Three times a day (TID) | ORAL | Status: DC | PRN

## 2015-10-19 MED FILL — MINOCYCLINE 50 MG CAPSULE: 50 | 30 days supply | Qty: 60 | Fill #0

## 2015-10-19 NOTE — Progress Notes (Signed)
Pre visit review using our clinic review tool, if applicable. No additional management support is needed unless otherwise documented below in the visit note. 

## 2015-10-19 NOTE — Progress Notes (Signed)
Subjective:  Patient ID: Julie Clark, female    DOB: 1955/10/25  Age: 60 y.o. MRN: [id number]  CC: Annual Exam   HPI PATRECIA VEIGA presents for a CPX.  She complains of recurrent development of skin cysts in her inguinal and axillary regions. She previously saw a dermatologist and was told not to worry about this but she remains concerned about the cysts. Historically, she felt like taking minocycline intermittently helped resolve the cysts.  She has a remote history of a very brief episode of atrial fibrillation, she has had no recent episodes of palpitations, chest pain, shortness of breath, dizziness, dyspnea on exertion, edema, or fatigue. Her blood pressure has been well controlled on the combination of amlodipine and metoprolol.   Past Medical History  Diagnosis Date  . Hypertension   . Hyperlipidemia   . Heart murmur    Past Surgical History  Procedure Laterality Date  . Cesarean section      x2    reports that she has never smoked. She has never used smokeless tobacco. She reports that she does not drink alcohol or use illicit drugs. family history includes Cancer in her father; Colon cancer in her maternal aunt; Hyperlipidemia in her mother; Hypertension in her mother; Stroke in her mother; Thyroid disease in her mother. There is no history of Diabetes, Depression, Early death, Hearing loss, Heart disease, Kidney disease, or Alcohol abuse. Allergies  Allergen Reactions  . Lovastatin     Muscle aches  . Penicillins Rash    Has patient had a PCN reaction causing immediate rash, facial/tongue/throat swelling, SOB or lightheadedness with hypotension: unknown Has patient had a PCN reaction causing severe rash involving mucus membranes or skin necrosis: No  Has patient had a PCN reaction that required hospitalization: No Has patient had a PCN reaction occurring within the last 10 years: No  If all of the above answers are "NO", then may proceed with Cephalosporin use.      Outpatient Prescriptions Prior to Visit  Medication Sig Dispense Refill  . amLODipine (NORVASC) 5 MG tablet Take 1 tablet (5 mg total) by mouth daily. 90 tablet 3  . aspirin EC 81 MG tablet Take 81 mg by mouth daily.    . metoprolol succinate (TOPROL-XL) 25 MG 24 hr tablet Take 1 tablet (25 mg total) by mouth 2 (two) times daily. 180 tablet 3  . pantoprazole (PROTONIX) 40 MG tablet Take 1 tablet (40 mg total) by mouth daily. 90 tablet 3  . ibuprofen (ADVIL,MOTRIN) 800 MG tablet Take 800 mg by mouth every 8 (eight) hours as needed.     No facility-administered medications prior to visit.    ROS Review of Systems  Constitutional: Negative.  Negative for fever, chills, diaphoresis and fatigue.  HENT: Negative.  Negative for sinus pressure, sore throat and trouble swallowing.   Eyes: Negative.   Respiratory: Negative.  Negative for cough, choking, chest tightness, shortness of breath and stridor.   Cardiovascular: Negative.  Negative for chest pain, palpitations and leg swelling.  Gastrointestinal: Negative.  Negative for abdominal pain.  Endocrine: Negative.   Genitourinary: Negative.   Musculoskeletal: Positive for arthralgias. Negative for myalgias, back pain and neck pain.  Skin: Negative.   Allergic/Immunologic: Negative.   Neurological: Negative.  Negative for dizziness, tremors, syncope, light-headedness and numbness.  Hematological: Negative.  Negative for adenopathy. Does not bruise/bleed easily.  Psychiatric/Behavioral: Negative.     Objective:  BP 110/60 mmHg  Pulse 67  Temp(Src) 98.6 F (37  C) (Oral)  Resp 16  Ht 5' 5"  (1.651 m)  Wt 164 lb (74.39 kg)  BMI 27.29 kg/m2  SpO2 98%  BP Readings from Last 3 Encounters:  10/19/15 110/60  07/22/15 122/74  07/19/15 100/64    Wt Readings from Last 3 Encounters:  10/19/15 164 lb (74.39 kg)  07/22/15 163 lb 3.2 oz (74.027 kg)  07/19/15 161 lb (73.029 kg)    Physical Exam  Constitutional: She is oriented to  person, place, and time. No distress.  HENT:  Mouth/Throat: Oropharynx is clear and moist. No oropharyngeal exudate.  Eyes: Conjunctivae are normal. Right eye exhibits no discharge. Left eye exhibits no discharge. No scleral icterus.  Neck: Normal range of motion. Neck supple. No JVD present. No tracheal deviation present. No thyromegaly present.  Cardiovascular: Normal rate, regular rhythm and intact distal pulses.  Exam reveals no gallop and no friction rub.   Murmur heard.  Systolic murmur is present with a grade of 1/6   No diastolic murmur is present  Pulses:      Carotid pulses are 1+ on the right side, and 1+ on the left side.      Radial pulses are 1+ on the right side, and 1+ on the left side.       Femoral pulses are 1+ on the right side, and 1+ on the left side.      Popliteal pulses are 1+ on the right side, and 1+ on the left side.       Dorsalis pedis pulses are 1+ on the right side, and 1+ on the left side.       Posterior tibial pulses are 1+ on the right side, and 1+ on the left side.  1/6 SEM heard everywhere  Pulmonary/Chest: Effort normal and breath sounds normal. No stridor. No respiratory distress. She has no wheezes. She has no rales. She exhibits no tenderness.  Abdominal: Soft. Bowel sounds are normal. She exhibits no distension and no mass. There is no tenderness. There is no rebound and no guarding.  Musculoskeletal: Normal range of motion. She exhibits no edema or tenderness.  Lymphadenopathy:    She has no cervical adenopathy.  Neurological: She is oriented to person, place, and time.  Skin: Skin is warm and dry. No rash noted. She is not diaphoretic. No erythema. No pallor.  Psychiatric: She has a normal mood and affect. Her behavior is normal. Judgment and thought content normal.  Vitals reviewed.   Lab Results  Component Value Date   WBC 11.3* 07/18/2015   HGB 13.9 07/18/2015   HCT 43.9 07/18/2015   PLT 410* 07/18/2015   GLUCOSE 118* 07/18/2015    CHOL 218* 11/10/2014   TRIG 66.0 11/10/2014   HDL 43.50 11/10/2014   LDLCALC 161* 11/10/2014   ALT 23 07/18/2015   AST 26 07/18/2015   NA 143 07/18/2015   K 3.6 07/18/2015   CL 105 07/18/2015   CREATININE 0.71 07/18/2015   BUN 14 07/18/2015   CO2 26 07/18/2015   TSH 0.685 07/18/2015    Dg Chest 2 View  07/18/2015  CLINICAL DATA:  States recent URI last week, but this a.m, started having rapid heart rate, hx of heart murmur, no other chest complaints EXAM: CHEST - 2 VIEW COMPARISON:  None available FINDINGS: Lungs are clear. Heart size and mediastinal contours are within normal limits. No effusion. Visualized skeletal structures are unremarkable. IMPRESSION: No acute cardiopulmonary disease. Electronically Signed   By: Eden Emms.D.  On: 07/18/2015 12:29   Ct Angio Chest Pe W/cm &/or Wo Cm  07/18/2015  CLINICAL DATA:  Syncopal episode with tachycardia. History of heart murmur. Elevated D-dimer. EXAM: CT ANGIOGRAPHY CHEST WITH CONTRAST TECHNIQUE: Multidetector CT imaging of the chest was performed using the standard protocol during bolus administration of intravenous contrast. Multiplanar CT image reconstructions and MIPs were obtained to evaluate the vascular anatomy. CONTRAST:  119m OMNIPAQUE IOHEXOL 350 MG/ML SOLN COMPARISON:  Chest x-ray 07/18/2015 FINDINGS: Mediastinum/Nodes: No breast masses, supraclavicular or axillary lymphadenopathy. Small scattered lymph nodes are noted. There is a simple appearing cyst noted in the left breast. The thyroid gland is grossly in. The heart is normal in size. No pericardial effusion. The aorta is normal in caliber. No dissection. Scattered atherosclerotic calcifications. The branch vessels are patent. Scattered coronary artery calcifications. The pulmonary arterial tree is well opacified. No filling defects to suggest pulmonary emboli. No mediastinal or hilar mass or lymphadenopathy. Small scattered lymph nodes are noted. The esophagus is grossly normal.  Lungs/Pleura: No acute pulmonary findings. No interstitial lung disease or bronchiectasis. Minimal dependent atelectasis. No edema, infiltrates or effusions. No worrisome pulmonary lesions. Upper abdomen: No significant upper abdominal findings. Musculoskeletal: No significant bony findings. Mild degenerative changes involving the mid thoracic spine. No worrisome bone lesions. Review of the MIP images confirms the above findings. IMPRESSION: 1. No CT findings for pulmonary embolism. 2. Normal thoracic aorta except for scattered atherosclerotic calcifications. 3. No mediastinal or hilar mass or adenopathy. 4. No acute pulmonary findings. Electronically Signed   By: PMarijo SanesM.D.   On: 07/18/2015 13:51    Assessment & Plan:   CTeyonnawas seen today for annual exam.  Diagnoses and all orders for this visit:  Hidradenitis suppurativa- will continue minocycline as needed -     minocycline (DYNACIN) 50 MG tablet; Take 1 tablet (50 mg total) by mouth 2 (two) times daily.  Visit for screening mammogram -     MM DIGITAL SCREENING BILATERAL; Future  Undiagnosed cardiac murmurs- She has a follow with cardiology soon  Routine general medical examination at a health care facility- exam completed, labs ordered and will be reviewed, her Pap is UTD, she is referred for screening mammogram, colonoscopy is up-to-date, patient education material was given. -     Lipid panel; Future -     Comprehensive metabolic panel; Future -     CBC with Differential/Platelet; Future -     TSH; Future -     Hepatitis C antibody; Future -     HIV antibody; Future  Primary osteoarthritis of both knees -     ibuprofen (ADVIL,MOTRIN) 800 MG tablet; Take 1 tablet (800 mg total) by mouth every 8 (eight) hours as needed.  I have changed Ms. Corcino's ibuprofen. I am also having her start on minocycline. Additionally, I am having her maintain her aspirin EC, metoprolol succinate, pantoprazole, and amLODipine.  Meds ordered  this encounter  Medications  . ibuprofen (ADVIL,MOTRIN) 800 MG tablet    Sig: Take 1 tablet (800 mg total) by mouth every 8 (eight) hours as needed.    Dispense:  30 tablet    Refill:  11  . minocycline (DYNACIN) 50 MG tablet    Sig: Take 1 tablet (50 mg total) by mouth 2 (two) times daily.    Dispense:  60 tablet    Refill:  1     Follow-up: Return if symptoms worsen or fail to improve.  TScarlette Calico MD

## 2015-10-19 NOTE — Patient Instructions (Signed)
Preventive Care for Adults, Female A healthy lifestyle and preventive care can promote health and wellness. Preventive health guidelines for women include the following key practices.  A routine yearly physical is a good way to check with your health care provider about your health and preventive screening. It is a chance to share any concerns and updates on your health and to receive a thorough exam.  Visit your dentist for a routine exam and preventive care every 6 months. Brush your teeth twice a day and floss once a day. Good oral hygiene prevents tooth decay and gum disease.  The frequency of eye exams is based on your age, health, family medical history, use of contact lenses, and other factors. Follow your health care provider's recommendations for frequency of eye exams.  Eat a healthy diet. Foods like vegetables, fruits, whole grains, low-fat dairy products, and lean protein foods contain the nutrients you need without too many calories. Decrease your intake of foods high in solid fats, added sugars, and salt. Eat the right amount of calories for you.Get information about a proper diet from your health care provider, if necessary.  Regular physical exercise is one of the most important things you can do for your health. Most adults should get at least 150 minutes of moderate-intensity exercise (any activity that increases your heart rate and causes you to sweat) each week. In addition, most adults need muscle-strengthening exercises on 2 or more days a week.  Maintain a healthy weight. The body mass index (BMI) is a screening tool to identify possible weight problems. It provides an estimate of body fat based on height and weight. Your health care provider can find your BMI and can help you achieve or maintain a healthy weight.For adults 20 years and older:  A BMI below 18.5 is considered underweight.  A BMI of 18.5 to 24.9 is normal.  A BMI of 25 to 29.9 is considered overweight.  A  BMI of 30 and above is considered obese.  Maintain normal blood lipids and cholesterol levels by exercising and minimizing your intake of saturated fat. Eat a balanced diet with plenty of fruit and vegetables. Blood tests for lipids and cholesterol should begin at age 45 and be repeated every 5 years. If your lipid or cholesterol levels are high, you are over 50, or you are at high risk for heart disease, you may need your cholesterol levels checked more frequently.Ongoing high lipid and cholesterol levels should be treated with medicines if diet and exercise are not working.  If you smoke, find out from your health care provider how to quit. If you do not use tobacco, do not start.  Lung cancer screening is recommended for adults aged 45-80 years who are at high risk for developing lung cancer because of a history of smoking. A yearly low-dose CT scan of the lungs is recommended for people who have at least a 30-pack-year history of smoking and are a current smoker or have quit within the past 15 years. A pack year of smoking is smoking an average of 1 pack of cigarettes a day for 1 year (for example: 1 pack a day for 30 years or 2 packs a day for 15 years). Yearly screening should continue until the smoker has stopped smoking for at least 15 years. Yearly screening should be stopped for people who develop a health problem that would prevent them from having lung cancer treatment.  If you are pregnant, do not drink alcohol. If you are  breastfeeding, be very cautious about drinking alcohol. If you are not pregnant and choose to drink alcohol, do not have more than 1 drink per day. One drink is considered to be 12 ounces (355 mL) of beer, 5 ounces (148 mL) of wine, or 1.5 ounces (44 mL) of liquor.  Avoid use of street drugs. Do not share needles with anyone. Ask for help if you need support or instructions about stopping the use of drugs.  High blood pressure causes heart disease and increases the risk  of stroke. Your blood pressure should be checked at least every 1 to 2 years. Ongoing high blood pressure should be treated with medicines if weight loss and exercise do not work.  If you are 9-63 years old, ask your health care provider if you should take aspirin to prevent strokes.  Diabetes screening is done by taking a blood sample to check your blood glucose level after you have not eaten for a certain period of time (fasting). If you are not overweight and you do not have risk factors for diabetes, you should be screened once every 3 years starting at age 62. If you are overweight or obese and you are 50-67 years of age, you should be screened for diabetes every year as part of your cardiovascular risk assessment.  Breast cancer screening is essential preventive care for women. You should practice "breast self-awareness." This means understanding the normal appearance and feel of your breasts and may include breast self-examination. Any changes detected, no matter how small, should be reported to a health care provider. Women in their 66s and 30s should have a clinical breast exam (CBE) by a health care provider as part of a regular health exam every 1 to 3 years. After age 48, women should have a CBE every year. Starting at age 47, women should consider having a mammogram (breast X-ray test) every year. Women who have a family history of breast cancer should talk to their health care provider about genetic screening. Women at a high risk of breast cancer should talk to their health care providers about having an MRI and a mammogram every year.  Breast cancer gene (BRCA)-related cancer risk assessment is recommended for women who have family members with BRCA-related cancers. BRCA-related cancers include breast, ovarian, tubal, and peritoneal cancers. Having family members with these cancers may be associated with an increased risk for harmful changes (mutations) in the breast cancer genes BRCA1 and  BRCA2. Results of the assessment will determine the need for genetic counseling and BRCA1 and BRCA2 testing.  Your health care provider may recommend that you be screened regularly for cancer of the pelvic organs (ovaries, uterus, and vagina). This screening involves a pelvic examination, including checking for microscopic changes to the surface of your cervix (Pap test). You may be encouraged to have this screening done every 3 years, beginning at age 73.  For women ages 48-65, health care providers may recommend pelvic exams and Pap testing every 3 years, or they may recommend the Pap and pelvic exam, combined with testing for human papilloma virus (HPV), every 5 years. Some types of HPV increase your risk of cervical cancer. Testing for HPV may also be done on women of any age with unclear Pap test results.  Other health care providers may not recommend any screening for nonpregnant women who are considered low risk for pelvic cancer and who do not have symptoms. Ask your health care provider if a screening pelvic exam is right for  you.  If you have had past treatment for cervical cancer or a condition that could lead to cancer, you need Pap tests and screening for cancer for at least 20 years after your treatment. If Pap tests have been discontinued, your risk factors (such as having a new sexual partner) need to be reassessed to determine if screening should resume. Some women have medical problems that increase the chance of getting cervical cancer. In these cases, your health care provider may recommend more frequent screening and Pap tests.  Colorectal cancer can be detected and often prevented. Most routine colorectal cancer screening begins at the age of 50 years and continues through age 75 years. However, your health care provider may recommend screening at an earlier age if you have risk factors for colon cancer. On a yearly basis, your health care provider may provide home test kits to check  for hidden blood in the stool. Use of a small camera at the end of a tube, to directly examine the colon (sigmoidoscopy or colonoscopy), can detect the earliest forms of colorectal cancer. Talk to your health care provider about this at age 50, when routine screening begins. Direct exam of the colon should be repeated every 5-10 years through age 75 years, unless early forms of precancerous polyps or small growths are found.  People who are at an increased risk for hepatitis B should be screened for this virus. You are considered at high risk for hepatitis B if:  You were born in a country where hepatitis B occurs often. Talk with your health care provider about which countries are considered high risk.  Your parents were born in a high-risk country and you have not received a shot to protect against hepatitis B (hepatitis B vaccine).  You have HIV or AIDS.  You use needles to inject street drugs.  You live with, or have sex with, someone who has hepatitis B.  You get hemodialysis treatment.  You take certain medicines for conditions like cancer, organ transplantation, and autoimmune conditions.  Hepatitis C blood testing is recommended for all people born from 1945 through 1965 and any individual with known risks for hepatitis C.  Practice safe sex. Use condoms and avoid high-risk sexual practices to reduce the spread of sexually transmitted infections (STIs). STIs include gonorrhea, chlamydia, syphilis, trichomonas, herpes, HPV, and human immunodeficiency virus (HIV). Herpes, HIV, and HPV are viral illnesses that have no cure. They can result in disability, cancer, and death.  You should be screened for sexually transmitted illnesses (STIs) including gonorrhea and chlamydia if:  You are sexually active and are younger than 24 years.  You are older than 24 years and your health care provider tells you that you are at risk for this type of infection.  Your sexual activity has changed  since you were last screened and you are at an increased risk for chlamydia or gonorrhea. Ask your health care provider if you are at risk.  If you are at risk of being infected with HIV, it is recommended that you take a prescription medicine daily to prevent HIV infection. This is called preexposure prophylaxis (PrEP). You are considered at risk if:  You are sexually active and do not regularly use condoms or know the HIV status of your partner(s).  You take drugs by injection.  You are sexually active with a partner who has HIV.  Talk with your health care provider about whether you are at high risk of being infected with HIV. If   you choose to begin PrEP, you should first be tested for HIV. You should then be tested every 3 months for as long as you are taking PrEP.  Osteoporosis is a disease in which the bones lose minerals and strength with aging. This can result in serious bone fractures or breaks. The risk of osteoporosis can be identified using a bone density scan. Women ages 67 years and over and women at risk for fractures or osteoporosis should discuss screening with their health care providers. Ask your health care provider whether you should take a calcium supplement or vitamin D to reduce the rate of osteoporosis.  Menopause can be associated with physical symptoms and risks. Hormone replacement therapy is available to decrease symptoms and risks. You should talk to your health care provider about whether hormone replacement therapy is right for you.  Use sunscreen. Apply sunscreen liberally and repeatedly throughout the day. You should seek shade when your shadow is shorter than you. Protect yourself by wearing long sleeves, pants, a wide-brimmed hat, and sunglasses year round, whenever you are outdoors.  Once a month, do a whole body skin exam, using a mirror to look at the skin on your back. Tell your health care provider of new moles, moles that have irregular borders, moles that  are larger than a pencil eraser, or moles that have changed in shape or color.  Stay current with required vaccines (immunizations).  Influenza vaccine. All adults should be immunized every year.  Tetanus, diphtheria, and acellular pertussis (Td, Tdap) vaccine. Pregnant women should receive 1 dose of Tdap vaccine during each pregnancy. The dose should be obtained regardless of the length of time since the last dose. Immunization is preferred during the 27th-36th week of gestation. An adult who has not previously received Tdap or who does not know her vaccine status should receive 1 dose of Tdap. This initial dose should be followed by tetanus and diphtheria toxoids (Td) booster doses every 10 years. Adults with an unknown or incomplete history of completing a 3-dose immunization series with Td-containing vaccines should begin or complete a primary immunization series including a Tdap dose. Adults should receive a Td booster every 10 years.  Varicella vaccine. An adult without evidence of immunity to varicella should receive 2 doses or a second dose if she has previously received 1 dose. Pregnant females who do not have evidence of immunity should receive the first dose after pregnancy. This first dose should be obtained before leaving the health care facility. The second dose should be obtained 4-8 weeks after the first dose.  Human papillomavirus (HPV) vaccine. Females aged 13-26 years who have not received the vaccine previously should obtain the 3-dose series. The vaccine is not recommended for use in pregnant females. However, pregnancy testing is not needed before receiving a dose. If a female is found to be pregnant after receiving a dose, no treatment is needed. In that case, the remaining doses should be delayed until after the pregnancy. Immunization is recommended for any person with an immunocompromised condition through the age of 61 years if she did not get any or all doses earlier. During the  3-dose series, the second dose should be obtained 4-8 weeks after the first dose. The third dose should be obtained 24 weeks after the first dose and 16 weeks after the second dose.  Zoster vaccine. One dose is recommended for adults aged 30 years or older unless certain conditions are present.  Measles, mumps, and rubella (MMR) vaccine. Adults born  before 1957 generally are considered immune to measles and mumps. Adults born in 1957 or later should have 1 or more doses of MMR vaccine unless there is a contraindication to the vaccine or there is laboratory evidence of immunity to each of the three diseases. A routine second dose of MMR vaccine should be obtained at least 28 days after the first dose for students attending postsecondary schools, health care workers, or international travelers. People who received inactivated measles vaccine or an unknown type of measles vaccine during 1963-1967 should receive 2 doses of MMR vaccine. People who received inactivated mumps vaccine or an unknown type of mumps vaccine before 1979 and are at high risk for mumps infection should consider immunization with 2 doses of MMR vaccine. For females of childbearing age, rubella immunity should be determined. If there is no evidence of immunity, females who are not pregnant should be vaccinated. If there is no evidence of immunity, females who are pregnant should delay immunization until after pregnancy. Unvaccinated health care workers born before 1957 who lack laboratory evidence of measles, mumps, or rubella immunity or laboratory confirmation of disease should consider measles and mumps immunization with 2 doses of MMR vaccine or rubella immunization with 1 dose of MMR vaccine.  Pneumococcal 13-valent conjugate (PCV13) vaccine. When indicated, a person who is uncertain of his immunization history and has no record of immunization should receive the PCV13 vaccine. All adults 65 years of age and older should receive this  vaccine. An adult aged 19 years or older who has certain medical conditions and has not been previously immunized should receive 1 dose of PCV13 vaccine. This PCV13 should be followed with a dose of pneumococcal polysaccharide (PPSV23) vaccine. Adults who are at high risk for pneumococcal disease should obtain the PPSV23 vaccine at least 8 weeks after the dose of PCV13 vaccine. Adults older than 60 years of age who have normal immune system function should obtain the PPSV23 vaccine dose at least 1 year after the dose of PCV13 vaccine.  Pneumococcal polysaccharide (PPSV23) vaccine. When PCV13 is also indicated, PCV13 should be obtained first. All adults aged 65 years and older should be immunized. An adult younger than age 65 years who has certain medical conditions should be immunized. Any person who resides in a nursing home or long-term care facility should be immunized. An adult smoker should be immunized. People with an immunocompromised condition and certain other conditions should receive both PCV13 and PPSV23 vaccines. People with human immunodeficiency virus (HIV) infection should be immunized as soon as possible after diagnosis. Immunization during chemotherapy or radiation therapy should be avoided. Routine use of PPSV23 vaccine is not recommended for American Indians, [place] Natives, or people younger than 65 years unless there are medical conditions that require PPSV23 vaccine. When indicated, people who have unknown immunization and have no record of immunization should receive PPSV23 vaccine. One-time revaccination 5 years after the first dose of PPSV23 is recommended for people aged 19-64 years who have chronic kidney failure, nephrotic syndrome, asplenia, or immunocompromised conditions. People who received 1-2 doses of PPSV23 before age 65 years should receive another dose of PPSV23 vaccine at age 65 years or later if at least 5 years have passed since the previous dose. Doses of PPSV23 are not  needed for people immunized with PPSV23 at or after age 65 years.  Meningococcal vaccine. Adults with asplenia or persistent complement component deficiencies should receive 2 doses of quadrivalent meningococcal conjugate (MenACWY-D) vaccine. The doses should be obtained   at least 2 months apart. Microbiologists working with certain meningococcal bacteria, [employer] recruits, people at risk during an outbreak, and people who travel to or live in countries with a high rate of meningitis should be immunized. A first-year college student up through age 66 years who is living in a residence hall should receive a dose if she did not receive a dose on or after her 16th birthday. Adults who have certain high-risk conditions should receive one or more doses of vaccine.  Hepatitis A vaccine. Adults who wish to be protected from this disease, have certain high-risk conditions, work with hepatitis A-infected animals, work in hepatitis A research labs, or travel to or work in countries with a high rate of hepatitis A should be immunized. Adults who were previously unvaccinated and who anticipate close contact with an international adoptee during the first 60 days after arrival in the [place] from a country with a high rate of hepatitis A should be immunized.  Hepatitis B vaccine. Adults who wish to be protected from this disease, have certain high-risk conditions, may be exposed to blood or other infectious body fluids, are household contacts or sex partners of hepatitis B positive people, are clients or workers in certain care facilities, or travel to or work in countries with a high rate of hepatitis B should be immunized.  Haemophilus influenzae type b (Hib) vaccine. A previously unvaccinated person with asplenia or sickle cell disease or having a scheduled splenectomy should receive 1 dose of Hib vaccine. Regardless of previous immunization, a recipient of a hematopoietic stem cell transplant should receive a  3-dose series 6-12 months after her successful transplant. Hib vaccine is not recommended for adults with HIV infection. Preventive Services / Frequency Ages 64 to 64 years  Blood pressure check.** / Every 3-5 years.  Lipid and cholesterol check.** / Every 5 years beginning at age 38.  Clinical breast exam.** / Every 3 years for women in their 62s and 68s.  BRCA-related cancer risk assessment.** / For women who have family members with a BRCA-related cancer (breast, ovarian, tubal, or peritoneal cancers).  Pap test.** / Every 2 years from ages 66 through 29. Every 3 years starting at age 86 through age 23 or 15 with a history of 3 consecutive normal Pap tests.  HPV screening.** / Every 3 years from ages 42 through ages 11 to 73 with a history of 3 consecutive normal Pap tests.  Hepatitis C blood test.** / For any individual with known risks for hepatitis C.  Skin self-exam. / Monthly.  Influenza vaccine. / Every year.  Tetanus, diphtheria, and acellular pertussis (Tdap, Td) vaccine.** / Consult your health care provider. Pregnant women should receive 1 dose of Tdap vaccine during each pregnancy. 1 dose of Td every 10 years.  Varicella vaccine.** / Consult your health care provider. Pregnant females who do not have evidence of immunity should receive the first dose after pregnancy.  HPV vaccine. / 3 doses over 6 months, if 46 and younger. The vaccine is not recommended for use in pregnant females. However, pregnancy testing is not needed before receiving a dose.  Measles, mumps, rubella (MMR) vaccine.** / You need at least 1 dose of MMR if you were born in 1957 or later. You may also need a 2nd dose. For females of childbearing age, rubella immunity should be determined. If there is no evidence of immunity, females who are not pregnant should be vaccinated. If there is no evidence of immunity, females who are  pregnant should delay immunization until after pregnancy.  Pneumococcal  13-valent conjugate (PCV13) vaccine.** / Consult your health care provider.  Pneumococcal polysaccharide (PPSV23) vaccine.** / 1 to 2 doses if you smoke cigarettes or if you have certain conditions.  Meningococcal vaccine.** / 1 dose if you are age 72 to 60 years and a Market researcher living in a residence hall, or have one of several medical conditions, you need to get vaccinated against meningococcal disease. You may also need additional booster doses.  Hepatitis A vaccine.** / Consult your health care provider.  Hepatitis B vaccine.** / Consult your health care provider.  Haemophilus influenzae type b (Hib) vaccine.** / Consult your health care provider. Ages 72 to 26 years  Blood pressure check.** / Every year.  Lipid and cholesterol check.** / Every 5 years beginning at age 72 years.  Lung cancer screening. / Every year if you are aged 38-80 years and have a 30-pack-year history of smoking and currently smoke or have quit within the past 15 years. Yearly screening is stopped once you have quit smoking for at least 15 years or develop a health problem that would prevent you from having lung cancer treatment.  Clinical breast exam.** / Every year after age 83 years.  BRCA-related cancer risk assessment.** / For women who have family members with a BRCA-related cancer (breast, ovarian, tubal, or peritoneal cancers).  Mammogram.** / Every year beginning at age 58 years and continuing for as long as you are in good health. Consult with your health care provider.  Pap test.** / Every 3 years starting at age 30 years through age 49 or 28 years with a history of 3 consecutive normal Pap tests.  HPV screening.** / Every 3 years from ages 2 years through ages 27 to 62 years with a history of 3 consecutive normal Pap tests.  Fecal occult blood test (FOBT) of stool. / Every year beginning at age 33 years and continuing until age 71 years. You may not need to do this test if you get  a colonoscopy every 10 years.  Flexible sigmoidoscopy or colonoscopy.** / Every 5 years for a flexible sigmoidoscopy or every 10 years for a colonoscopy beginning at age 64 years and continuing until age 29 years.  Hepatitis C blood test.** / For all people born from [birth year] through 1965 and any individual with known risks for hepatitis C.  Skin self-exam. / Monthly.  Influenza vaccine. / Every year.  Tetanus, diphtheria, and acellular pertussis (Tdap/Td) vaccine.** / Consult your health care provider. Pregnant women should receive 1 dose of Tdap vaccine during each pregnancy. 1 dose of Td every 10 years.  Varicella vaccine.** / Consult your health care provider. Pregnant females who do not have evidence of immunity should receive the first dose after pregnancy.  Zoster vaccine.** / 1 dose for adults aged 25 years or older.  Measles, mumps, rubella (MMR) vaccine.** / You need at least 1 dose of MMR if you were born in 1957 or later. You may also need a second dose. For females of childbearing age, rubella immunity should be determined. If there is no evidence of immunity, females who are not pregnant should be vaccinated. If there is no evidence of immunity, females who are pregnant should delay immunization until after pregnancy.  Pneumococcal 13-valent conjugate (PCV13) vaccine.** / Consult your health care provider.  Pneumococcal polysaccharide (PPSV23) vaccine.** / 1 to 2 doses if you smoke cigarettes or if you have certain conditions.  Meningococcal vaccine.** /  Consult your health care provider.  Hepatitis A vaccine.** / Consult your health care provider.  Hepatitis B vaccine.** / Consult your health care provider.  Haemophilus influenzae type b (Hib) vaccine.** / Consult your health care provider. Ages 51 years and over  Blood pressure check.** / Every year.  Lipid and cholesterol check.** / Every 5 years beginning at age 70 years.  Lung cancer screening. / Every year if you  are aged 61-80 years and have a 30-pack-year history of smoking and currently smoke or have quit within the past 15 years. Yearly screening is stopped once you have quit smoking for at least 15 years or develop a health problem that would prevent you from having lung cancer treatment.  Clinical breast exam.** / Every year after age 78 years.  BRCA-related cancer risk assessment.** / For women who have family members with a BRCA-related cancer (breast, ovarian, tubal, or peritoneal cancers).  Mammogram.** / Every year beginning at age 69 years and continuing for as long as you are in good health. Consult with your health care provider.  Pap test.** / Every 3 years starting at age 31 years through age 53 or 21 years with 3 consecutive normal Pap tests. Testing can be stopped between 65 and 70 years with 3 consecutive normal Pap tests and no abnormal Pap or HPV tests in the past 10 years.  HPV screening.** / Every 3 years from ages 89 years through ages 51 or 15 years with a history of 3 consecutive normal Pap tests. Testing can be stopped between 65 and 70 years with 3 consecutive normal Pap tests and no abnormal Pap or HPV tests in the past 10 years.  Fecal occult blood test (FOBT) of stool. / Every year beginning at age 26 years and continuing until age 54 years. You may not need to do this test if you get a colonoscopy every 10 years.  Flexible sigmoidoscopy or colonoscopy.** / Every 5 years for a flexible sigmoidoscopy or every 10 years for a colonoscopy beginning at age 57 years and continuing until age 24 years.  Hepatitis C blood test.** / For all people born from [birth year] through 1965 and any individual with known risks for hepatitis C.  Osteoporosis screening.** / A one-time screening for women ages 20 years and over and women at risk for fractures or osteoporosis.  Skin self-exam. / Monthly.  Influenza vaccine. / Every year.  Tetanus, diphtheria, and acellular pertussis (Tdap/Td)  vaccine.** / 1 dose of Td every 10 years.  Varicella vaccine.** / Consult your health care provider.  Zoster vaccine.** / 1 dose for adults aged 85 years or older.  Pneumococcal 13-valent conjugate (PCV13) vaccine.** / Consult your health care provider.  Pneumococcal polysaccharide (PPSV23) vaccine.** / 1 dose for all adults aged 68 years and older.  Meningococcal vaccine.** / Consult your health care provider.  Hepatitis A vaccine.** / Consult your health care provider.  Hepatitis B vaccine.** / Consult your health care provider.  Haemophilus influenzae type b (Hib) vaccine.** / Consult your health care provider. ** Family history and personal history of risk and conditions may change your health care provider's recommendations.   This information is not intended to replace advice given to you by your health care provider. Make sure you discuss any questions you have with your health care provider.   Document Released: 07/10/2001 Document Revised: 06/04/2014 Document Reviewed: 10/09/2010 Elsevier Interactive Patient Education [employer].

## 2015-10-20 MED FILL — IBUPROFEN 800 MG TABLET: 800 | 10 days supply | Qty: 30 | Fill #0

## 2015-10-20 NOTE — Assessment & Plan Note (Signed)
Her blood pressure is adequately well-controlled

## 2015-10-20 NOTE — Assessment & Plan Note (Signed)
Clinically she has had no recurrence of atrial fibrillation. She has good rate and rhythm control with a combination of amlodipine and metoprolol. She reiterated today that she is not willing to take an anticoagulant.

## 2015-10-31 ENCOUNTER — Other Ambulatory Visit: Payer: Self-pay | Admitting: Internal Medicine

## 2015-10-31 DIAGNOSIS — Z1231 Encounter for screening mammogram for malignant neoplasm of breast: Secondary | ICD-10-CM

## 2015-11-02 NOTE — Progress Notes (Signed)
This encounter was created in error - please disregard.

## 2015-11-04 ENCOUNTER — Encounter: Payer: 59 | Admitting: Cardiology

## 2015-11-16 ENCOUNTER — Ambulatory Visit: Payer: 59 | Admitting: Cardiology

## 2015-11-22 ENCOUNTER — Ambulatory Visit
Admission: RE | Admit: 2015-11-22 | Discharge: 2015-11-22 | Disposition: A | Discharge status: Home / Self Care | Payer: 59 | Source: Ambulatory Visit | Attending: Internal Medicine | Admitting: Internal Medicine

## 2015-11-22 DIAGNOSIS — Z1231 Encounter for screening mammogram for malignant neoplasm of breast: Secondary | ICD-10-CM

## 2015-11-25 LAB — HM MAMMOGRAPHY

## 2015-11-25 NOTE — Addendum Note (Signed)
Addended by: Janith Lima on: 11/25/2015 10:01 AM   Modules accepted: Miquel Dunn

## 2015-11-30 MED FILL — PANTOPRAZOLE SOD DR 40 MG T: 40 | 90 days supply | Qty: 90 | Fill #0

## 2016-01-04 MED FILL — AMLODIPINE BESYLATE 5 MG TA: 5 | 90 days supply | Qty: 90 | Fill #0

## 2016-01-10 ENCOUNTER — Encounter: Payer: Self-pay | Admitting: Cardiology

## 2016-01-11 NOTE — Progress Notes (Signed)
Electrophysiology Office Note   Date:  01/12/2016   ID:  Julie Clark, DOB 1955-10-06, MRN [id number]  PCP:  Scarlette Calico, MD  Primary Electrophysiologist:  Seyed Heffley Meredith Leeds, MD    Chief Complaint  Patient presents with  . Follow-up    afib     History of Present Illness: Julie Clark is a 60 y.o. female who presents today for electrophysiology evaluation.    She has a history of hypertension, hyperlipidemia and she has heart murmur. She presented to the emergency room in February with low blood pressure. She was found to be in atrial fibrillation. She was given beta blockers which converted to sinus rhythm. She had no symptoms of shortness of breath , fatigue, or weakness.    Today, she denies symptoms of palpitations, chest pain, shortness of breath, orthopnea, PND, lower extremity edema, claudication, dizziness, presyncope, syncope, bleeding, or neurologic sequela. The patient is tolerating medications without difficulties and is otherwise without complaint today.  She did have an episode of palpitations approximately 2 weeks ago. She says that she was driving home from work and felt a fluttering in her chest. She took her evening doses of her medications, including metoprolol, but she did not break out of atrial fibrillation. She went to bed and woke up in atrial fibrillation and took an extra half dose of metoprolol and converted to sinus rhythm thereafter. She has had no further episodes of palpitations.   Past Medical History:  Diagnosis Date  . Heart murmur   . Hyperlipidemia   . Hypertension    Past Surgical History:  Procedure Laterality Date  . CESAREAN SECTION     x2     Current Outpatient Prescriptions  Medication Sig Dispense Refill  . amLODipine (NORVASC) 5 MG tablet Take 1 tablet (5 mg total) by mouth daily. 90 tablet 3  . aspirin EC 81 MG tablet Take 81 mg by mouth daily.    Marland Kitchen ibuprofen (ADVIL,MOTRIN) 800 MG tablet Take 1 tablet (800 mg total) by mouth  every 8 (eight) hours as needed. 30 tablet 11  . metoprolol succinate (TOPROL-XL) 25 MG 24 hr tablet Take 1 tablet (25 mg total) by mouth 2 (two) times daily. 180 tablet 3  . minocycline (DYNACIN) 50 MG tablet Take 1 tablet (50 mg total) by mouth 2 (two) times daily. 60 tablet 1  . pantoprazole (PROTONIX) 40 MG tablet Take 1 tablet (40 mg total) by mouth daily. 90 tablet 3   No current facility-administered medications for this visit.     Allergies:   Lovastatin and Penicillins   Social History:  The patient  reports that she has never smoked. She has never used smokeless tobacco. She reports that she does not drink alcohol or use drugs.   Family History:  The patient's family history includes Cancer in her father; Colon cancer in her maternal aunt; Hyperlipidemia in her mother; Hypertension in her mother; Stroke in her mother; Thyroid disease in her mother.    ROS:  Please see the history of present illness.   All other systems are reviewed and positive for palpitations.    PHYSICAL EXAM: VS:  BP 122/74   Pulse 64   Ht 5' 4"  (1.626 m)   Wt 161 lb (73 kg)   BMI 27.64 kg/m  , BMI Body mass index is 27.64 kg/m. GEN: Well nourished, well developed, in no acute distress  HEENT: normal  Neck: no JVD, carotid bruits, or masses Cardiac: RRR; 2/6 systolic murmur  at the base, no rubs, or gallops,no edema  Respiratory:  clear to auscultation bilaterally, normal work of breathing GI: soft, nontender, nondistended, + BS MS: no deformity or atrophy  Skin: warm and dry,  device pocket is well healed Neuro:  Strength and sensation are intact Psych: euthymic mood, full affect  EKG:  EKG is not ordered today. Personal review of the ECG shows sinus rhythm, rate 64  Recent Labs: 07/18/2015: ALT 23; BUN 14; Creatinine, Ser 0.71; Hemoglobin 13.9; Magnesium 2.0; Platelets 410; Potassium 3.6; Sodium 143; TSH 0.685    Lipid Panel     Component Value Date/Time   CHOL 218 (H) 11/10/2014 0920    TRIG 66.0 11/10/2014 0920   HDL 43.50 11/10/2014 0920   CHOLHDL 5 11/10/2014 0920   VLDL 13.2 11/10/2014 0920   LDLCALC 161 (H) 11/10/2014 0920     Wt Readings from Last 3 Encounters:  01/12/16 161 lb (73 kg)  10/19/15 164 lb (74.4 kg)  07/22/15 163 lb 3.2 oz (74 kg)      Other studies Reviewed: Additional studies/ records that were reviewed today include: TTE 10/09/15  Review of the above records today demonstrates:  - Left ventricle: The cavity size was normal. Wall thickness was normal. Systolic function was normal. The estimated ejection fraction was in the range of 55% to 60%. Wall motion was normal; there were no regional wall motion abnormalities. Doppler parameters are consistent with abnormal left ventricular relaxation (grade 1 diastolic dysfunction). - Aortic valve: Mean gradient: 63m Hg (S). Peak gradient: 136mHg (S). - Left atrium: The atrium was mildly dilated. - Pulmonary arteries: PA peak pressure: 3283mg (S).   ASSESSMENT AND PLAN:  1.   Paroxysmal atrial fibrillation:  Presented the emergency room in atrial fibrillation, and converted with beta blockers. I discussed with her the risks of stroke and atrial fibrillation. She has a CHADS2VASc score of 2, and therefore would benefit from anticoagulation. I had a long discussion with her of the risks and benefits of anticoagulation. She continues to not want to be anticoagulated. I have told her that if she has more atrial fibrillation in the future, she may require medications to decrease her burden and symptoms.    Current medicines are reviewed at length with the patient today.   The patient does not have concerns regarding her medicines.  The following changes were made today:  none  Labs/ tests ordered today include:  No orders of the defined types were placed in this encounter.    Disposition:   FU with Julie Clark 6 months  Signed, Jocelyn Lowery MarMeredith LeedsD  01/12/2016 11:33 AM      [facility] [address] [place] [id number]) [phone])

## 2016-01-12 ENCOUNTER — Encounter: Payer: Self-pay | Admitting: Cardiology

## 2016-01-12 ENCOUNTER — Ambulatory Visit (INDEPENDENT_AMBULATORY_CARE_PROVIDER_SITE_OTHER): Payer: 59 | Admitting: Cardiology

## 2016-01-12 VITALS — BP 122/74 | HR 64 | Ht 64.0 in | Wt 161.0 lb

## 2016-01-12 DIAGNOSIS — I480 Paroxysmal atrial fibrillation: Secondary | ICD-10-CM

## 2016-01-12 NOTE — Patient Instructions (Signed)
Medication Instructions:  Your physician recommends that you continue on your current medications as directed. Please refer to the Current Medication list given to you today.   Labwork: None ordered   Testing/Procedures: None ordered   Follow-Up: Your physician wants you to follow-up in: 6 months with Dr Cora Daniels will receive a reminder letter in the mail two months in advance. If you don't receive a letter, please call our office to schedule the follow-up appointment.   Any Other Special Instructions Will Be Listed Below (If Applicable).     If you need a refill on your cardiac medications before your next appointment, please call your pharmacy.

## 2016-02-23 MED FILL — METOPROLOL SUCC ER 25 MG TA: 25 | 90 days supply | Qty: 180 | Fill #0

## 2016-02-27 MED FILL — PANTOPRAZOLE SOD DR 40 MG T: 40 | 90 days supply | Qty: 90 | Fill #1

## 2016-04-04 MED FILL — AMLODIPINE BESYLATE 5 MG TA: 5 | 90 days supply | Qty: 90 | Fill #1

## 2016-05-17 MED FILL — IBUPROFEN 800 MG TABLET: 800 | 10 days supply | Qty: 30 | Fill #1

## 2016-05-23 MED FILL — METOPROLOL SUCC ER 25 MG TA: 25 | 90 days supply | Qty: 180 | Fill #1

## 2016-05-23 MED FILL — PANTOPRAZOLE SOD DR 40 MG T: 40 | 90 days supply | Qty: 90 | Fill #2

## 2016-07-03 MED FILL — AMLODIPINE BESYLATE 5 MG TA: 5 | 90 days supply | Qty: 90 | Fill #2

## 2016-07-17 MED FILL — IBUPROFEN 800 MG TABLET: 800 | 10 days supply | Qty: 30 | Fill #2

## 2016-08-16 ENCOUNTER — Telehealth: Payer: Self-pay | Admitting: Cardiology

## 2016-08-16 NOTE — Telephone Encounter (Signed)
New Message:   Pt wants to know if she needs to be pre medicated before her deep cleaning, next week when she goes to the dentist?

## 2016-08-16 NOTE — Telephone Encounter (Signed)
Informed patient that from a cardiac standpoint she does not need prophylaxis prior to dental procedures. Scheduled her for overdue 58mof/u Patient verbalized understanding and agreeable to plan.

## 2016-08-27 MED FILL — METOPROLOL SUCC ER 25 MG TA: 25 | 90 days supply | Qty: 180 | Fill #2

## 2016-08-27 MED FILL — PANTOPRAZOLE SOD DR 40 MG T: 40 | 90 days supply | Qty: 90 | Fill #3

## 2016-08-27 MED FILL — IBUPROFEN 800 MG TABLET: 800 | 10 days supply | Qty: 30 | Fill #3

## 2016-09-06 ENCOUNTER — Ambulatory Visit (INDEPENDENT_AMBULATORY_CARE_PROVIDER_SITE_OTHER): Payer: 59 | Admitting: Cardiology

## 2016-09-06 ENCOUNTER — Encounter: Payer: Self-pay | Admitting: Cardiology

## 2016-09-06 VITALS — BP 116/64 | HR 68 | Ht 64.0 in | Wt 165.6 lb

## 2016-09-06 DIAGNOSIS — I480 Paroxysmal atrial fibrillation: Secondary | ICD-10-CM | POA: Diagnosis not present

## 2016-09-06 NOTE — Patient Instructions (Signed)
Medication Instructions:  Your physician recommends that you continue on your current medications as directed. Please refer to the Current Medication list given to you today.  * If you need a refill on your cardiac medications before your next appointment, please call your pharmacy.   Labwork: None ordered  Testing/Procedures: None ordered  Follow-Up: Your physician wants you to follow-up in: 1 year with Dr. Camnitz.  You will receive a reminder letter in the mail two months in advance. If you don't receive a letter, please call our office to schedule the follow-up appointment.  Thank you for choosing [facility]!!   Keelyn Monjaras, RN [phone]        

## 2016-09-06 NOTE — Progress Notes (Signed)
Electrophysiology Office Note   Date:  09/06/2016   ID:  Julie Clark, DOB 10/07/1955, MRN [id number]  PCP:  Julie Calico, MD  Primary Electrophysiologist:  Julie Achord Meredith Leeds, MD    Chief Complaint  Patient presents with  . Follow-up    PAF     History of Present Illness: Julie Clark is a 61 y.o. female who presents today for electrophysiology evaluation.    She has a history of hypertension, hyperlipidemia and she has heart murmur. She presented to the emergency room in February with low blood pressure. She was found to be in atrial fibrillation. She was given beta blockers which converted to sinus rhythm. She had no symptoms of shortness of breath , fatigue, or weakness.    Today, she denies symptoms of palpitations, chest pain, shortness of breath, orthopnea, PND, lower extremity edema, claudication, dizziness, presyncope, syncope, bleeding, or neurologic sequela. The patient is tolerating medications without difficulties and is otherwise without complaint today.     Past Medical History:  Diagnosis Date  . Heart murmur   . Hyperlipidemia   . Hypertension    Past Surgical History:  Procedure Laterality Date  . CESAREAN SECTION     x2     Current Outpatient Prescriptions  Medication Sig Dispense Refill  . amLODipine (NORVASC) 5 MG tablet Take 1 tablet (5 mg total) by mouth daily. 90 tablet 3  . aspirin EC 81 MG tablet Take 81 mg by mouth daily.    Marland Kitchen ibuprofen (ADVIL,MOTRIN) 800 MG tablet Take 1 tablet (800 mg total) by mouth every 8 (eight) hours as needed. 30 tablet 11  . metoprolol succinate (TOPROL-XL) 25 MG 24 hr tablet Take 1 tablet (25 mg total) by mouth 2 (two) times daily. 180 tablet 3  . minocycline (DYNACIN) 50 MG tablet Take 1 tablet (50 mg total) by mouth 2 (two) times daily. 60 tablet 1  . pantoprazole (PROTONIX) 40 MG tablet Take 1 tablet (40 mg total) by mouth daily. 90 tablet 3   No current facility-administered medications for this visit.      Allergies:   Lovastatin and Penicillins   Social History:  The patient  reports that she has never smoked. She has never used smokeless tobacco. She reports that she does not drink alcohol or use drugs.   Family History:  The patient's family history includes Cancer in her father; Colon cancer in her maternal aunt; Hyperlipidemia in her mother; Hypertension in her mother; Stroke in her mother; Thyroid disease in her mother.    ROS:  Please see the history of present illness.   All other systems are reviewed and positive for none.    PHYSICAL EXAM: VS:  BP 116/64   Pulse 68   Ht 5' 4"  (1.626 m)   Wt 165 lb 9.6 oz (75.1 kg)   BMI 28.43 kg/m  , BMI Body mass index is 28.43 kg/m. GEN: Well nourished, well developed, in no acute distress  HEENT: normal  Neck: no JVD, carotid bruits, or masses Cardiac: RRR; 2/6 systolic murmur at the base, no rubs, or gallops,no edema  Respiratory:  clear to auscultation bilaterally, normal work of breathing GI: soft, nontender, nondistended, + BS MS: no deformity or atrophy  Skin: warm and dry,  device pocket is well healed Neuro:  Strength and sensation are intact Psych: euthymic mood, full affect  EKG:  EKG is ordered today. Personal review of the ECG shows ectopic atrial rhythm, APC, rate 68  Recent Labs:  No results found for requested labs within last 8760 hours.    Lipid Panel     Component Value Date/Time   CHOL 218 (H) 11/10/2014 0920   TRIG 66.0 11/10/2014 0920   HDL 43.50 11/10/2014 0920   CHOLHDL 5 11/10/2014 0920   VLDL 13.2 11/10/2014 0920   LDLCALC 161 (H) 11/10/2014 0920     Wt Readings from Last 3 Encounters:  09/06/16 165 lb 9.6 oz (75.1 kg)  01/12/16 161 lb (73 kg)  10/19/15 164 lb (74.4 kg)      Other studies Reviewed: Additional studies/ records that were reviewed today include: TTE 10/09/15  Review of the above records today demonstrates:  - Left ventricle: The cavity size was normal. Wall thickness was  normal. Systolic function was normal. The estimated ejection fraction was in the range of 55% to 60%. Wall motion was normal; there were no regional wall motion abnormalities. Doppler parameters are consistent with abnormal left ventricular relaxation (grade 1 diastolic dysfunction). - Aortic valve: Mean gradient: 45m Hg (S). Peak gradient: 129mHg (S). - Left atrium: The atrium was mildly dilated. - Pulmonary arteries: PA peak pressure: 3260mg (S).   ASSESSMENT AND PLAN:  1.   Paroxysmal atrial fibrillation:  Presented the emergency room in atrial fibrillation, and converted with beta blockers. He remains on Toprol XL twice a day, which she feels is controlling her symptoms. She is continuing to refuse anticoagulation saying that if she has another episode that she Julie Clark go on anticoagulation at that time. We'll continue current management.  This patients CHA2DS2-VASc Score and unadjusted Ischemic Stroke Rate (% per year) is equal to 2.2 % stroke rate/year from a score of 2  Above score calculated as 1 point each if present [CHF, HTN, DM, Vascular=MI/PAD/Aortic Plaque, Age if 65-74, or Female] Above score calculated as 2 points each if present [Age > 75, or Stroke/TIA/TE]   2. Hypertension: well controlled on Norvasc and metoprolol  Current medicines are reviewed at length with the patient today.   The patient does not have concerns regarding her medicines.  The following changes were made today:  none  Labs/ tests ordered today include:  Orders Placed This Encounter  Procedures  . EKG 12-Lead     Disposition:   FU with Julie Clark 12 months  Signed, Julie Clark MarMeredith LeedsD  09/06/2016 3:58 PM     [id number] [id number]) [phone])

## 2016-09-23 ENCOUNTER — Emergency Department (HOSPITAL_COMMUNITY)
Admission: EM | Admit: 2016-09-23 | Discharge: 2016-09-23 | Disposition: A | Discharge status: Home / Self Care | Payer: 59 | Attending: Emergency Medicine | Admitting: Emergency Medicine

## 2016-09-23 ENCOUNTER — Encounter (HOSPITAL_COMMUNITY): Payer: Self-pay | Admitting: *Deleted

## 2016-09-23 DIAGNOSIS — Z7982 Long term (current) use of aspirin: Secondary | ICD-10-CM | POA: Diagnosis not present

## 2016-09-23 DIAGNOSIS — R002 Palpitations: Secondary | ICD-10-CM | POA: Insufficient documentation

## 2016-09-23 DIAGNOSIS — I10 Essential (primary) hypertension: Secondary | ICD-10-CM | POA: Insufficient documentation

## 2016-09-23 DIAGNOSIS — Z79899 Other long term (current) drug therapy: Secondary | ICD-10-CM | POA: Diagnosis not present

## 2016-09-23 DIAGNOSIS — R531 Weakness: Secondary | ICD-10-CM | POA: Insufficient documentation

## 2016-09-23 DIAGNOSIS — Z791 Long term (current) use of non-steroidal anti-inflammatories (NSAID): Secondary | ICD-10-CM | POA: Diagnosis not present

## 2016-09-23 LAB — I-STAT CHEM 8, ED
BUN: 13 mg/dL (ref 6–20)
CHLORIDE: 104 mmol/L (ref 101–111)
Calcium, Ion: 1.2 mmol/L (ref 1.15–1.40)
Creatinine, Ser: 0.7 mg/dL (ref 0.44–1.00)
GLUCOSE: 103 mg/dL — AB (ref 65–99)
HEMATOCRIT: 44 % (ref 36.0–46.0)
HEMOGLOBIN: 15 g/dL (ref 12.0–15.0)
POTASSIUM: 3.9 mmol/L (ref 3.5–5.1)
Sodium: 143 mmol/L (ref 135–145)
TCO2: 29 mmol/L (ref 0–100)

## 2016-09-23 LAB — I-STAT TROPONIN, ED: TROPONIN I, POC: 0 ng/mL (ref 0.00–0.08)

## 2016-09-23 NOTE — ED Triage Notes (Signed)
Pt comes in saying that since yesterday, "she feels different." States her heart rate feels irregular. She has hx of a-fib and states this feels the same way.

## 2016-09-23 NOTE — ED Notes (Signed)
ED Provider at bedside. 

## 2016-09-23 NOTE — Discharge Instructions (Signed)
Schedule a follow-up appointment with your cardiologist. Call tomorrow and tell office staff that you were seen in the emergency department today. Return to the emergency Department if you are concerned for any reason

## 2016-09-23 NOTE — ED Provider Notes (Signed)
[facility] DEPT Provider Note   CSN: [id number] Arrival date & time: 09/23/16  1156     History   Chief Complaint Chief Complaint  Patient presents with  . Atrial Fibrillation    HPI Julie Clark is a 61 y.o. female.  HPI Patient felt generalized weakness "all day yesterday". It felt like I was in atrial fibrillation. She denied any chest pain no shortness of breath. No other symptoms. She treated herself with her usual medications. She feels much improved today. No longer feels as if she is in atrial fibrillation. She does complain of mild generalized weakness which is much improved. No other associated symptoms nothing makes symptoms better or worse Past Medical History:  Diagnosis Date  . A-fib ([place])   . Heart murmur   . Hyperlipidemia   . Hypertension     Patient Active Problem List   Diagnosis Date Noted  . Paroxysmal atrial fibrillation ([place]) 07/19/2015  . Hidradenitis suppurativa 02/16/2014  . DJD (degenerative joint disease) of knee 10/06/2013  . Other and unspecified hyperlipidemia 08/11/2012  . Essential hypertension, benign 08/11/2012  . Visit for screening mammogram 08/11/2012  . Routine general medical examination at a health care facility 08/11/2012  . Undiagnosed cardiac murmurs 08/11/2012    Past Surgical History:  Procedure Laterality Date  . CESAREAN SECTION     x2    OB History    No data available       Home Medications    Prior to Admission medications   Medication Sig Start Date End Date Taking? Authorizing Provider  amLODipine (NORVASC) 5 MG tablet Take 1 tablet (5 mg total) by mouth daily. 10/05/15   Janith Lima, MD  aspirin EC 81 MG tablet Take 81 mg by mouth daily.    Historical Provider, MD  ibuprofen (ADVIL,MOTRIN) 800 MG tablet Take 1 tablet (800 mg total) by mouth every 8 (eight) hours as needed. 10/19/15   Janith Lima, MD  metoprolol succinate (TOPROL-XL) 25 MG 24 hr tablet Take 1 tablet (25 mg total) by mouth 2 (two)  times daily. 10/05/15   Janith Lima, MD  minocycline (DYNACIN) 50 MG tablet Take 1 tablet (50 mg total) by mouth 2 (two) times daily. 10/19/15   Janith Lima, MD  pantoprazole (PROTONIX) 40 MG tablet Take 1 tablet (40 mg total) by mouth daily. 10/05/15   Janith Lima, MD    Family History Family History  Problem Relation Age of Onset  . Stroke Mother   . Hypertension Mother   . Hyperlipidemia Mother   . Thyroid disease Mother   . Cancer Father     prostate  . Colon cancer Maternal Aunt   . Diabetes Neg Hx   . Depression Neg Hx   . Early death Neg Hx   . Hearing loss Neg Hx   . Heart disease Neg Hx   . Kidney disease Neg Hx   . Alcohol abuse Neg Hx     Social History Social History  Substance Use Topics  . Smoking status: Never Smoker  . Smokeless tobacco: Never Used  . Alcohol use No     Allergies   Lovastatin and Penicillins   Review of Systems Review of Systems  HENT: Negative.   Respiratory: Negative.   Cardiovascular: Positive for palpitations.  Gastrointestinal: Negative.   Musculoskeletal: Negative.   Skin: Negative.   Neurological: Positive for weakness.       Generalized weakness  Psychiatric/Behavioral: Negative.   All other  systems reviewed and are negative.    Physical Exam Updated Vital Signs BP (!) 139/93   Pulse 94   Temp 98.8 F (37.1 C) (Oral)   Resp 18   Ht 5' 4"  (1.626 m)   Wt 154 lb (69.9 kg)   SpO2 99%   BMI 26.43 kg/m   Physical Exam  Constitutional: She is oriented to person, place, and time. She appears well-developed and well-nourished. No distress.  HENT:  Head: Normocephalic and atraumatic.  Eyes: Conjunctivae are normal. Pupils are equal, round, and reactive to light.  Neck: Neck supple. No tracheal deviation present. No thyromegaly present.  Cardiovascular: Normal rate, regular rhythm and normal heart sounds.   No murmur heard. Pulmonary/Chest: Effort normal and breath sounds normal.  Abdominal: Soft. Bowel  sounds are normal. She exhibits no distension. There is no tenderness.  Musculoskeletal: Normal range of motion. She exhibits no edema or tenderness.  Neurological: She is alert and oriented to person, place, and time. Coordination normal.  Skin: Skin is warm and dry. No rash noted.  Psychiatric: She has a normal mood and affect.  Nursing note and vitals reviewed.    ED Treatments / Results  Labs (all labs ordered are listed, but only abnormal results are displayed) Labs Reviewed  I-STAT CHEM 8, ED  Randolm Idol, ED    EKG  EKG Interpretation  Date/Time:  Sunday September 23 2016 12:10:59 EDT Ventricular Rate:  84 PR Interval:    QRS Duration: 89 QT Interval:  346 QTC Calculation: 409 R Axis:   12 Text Interpretation:  Normal sinus rhythm Premature ventricular complexes Borderline T abnormalities, inferior leads Premature ventricular complexes New since previous tracing Confirmed by Winfred Leeds  MD, Cooper Moroney [phone]) on 09/23/2016 12:21:48 PM       Radiology No results found.  Procedures Procedures (including critical care time)  Medications Ordered in ED Medications - No data to display  Results for orders placed or performed during the hospital encounter of 09/23/16  I-stat chem 8, ed  Result Value Ref Range   Sodium 143 135 - 145 mmol/L   Potassium 3.9 3.5 - 5.1 mmol/L   Chloride 104 101 - 111 mmol/L   BUN 13 6 - 20 mg/dL   Creatinine, Ser 0.70 0.44 - 1.00 mg/dL   Glucose, Bld 103 (H) 65 - 99 mg/dL   Calcium, Ion 1.20 1.15 - 1.40 mmol/L   TCO2 29 0 - 100 mmol/L   Hemoglobin 15.0 12.0 - 15.0 g/dL   HCT 44.0 36.0 - 46.0 %  I-stat troponin, ED  Result Value Ref Range   Troponin i, poc 0.00 0.00 - 0.08 ng/mL   Comment 3           No results found. Initial Impression / Assessment and Plan / ED Course  I have reviewed the triage vital signs and the nursing notes.  Pertinent labs & imaging results that were available during my care of the patient were reviewed by me  and considered in my medical decision making (see chart for details).     1:30 PM patient asymptomatic and feels ready to go home. Suggest follow-up visit with her cardiologist  Final Clinical Impressions(s) / ED Diagnoses  Dx #1 generalized weakness #2 palpitations Final diagnoses:  None    New Prescriptions New Prescriptions   No medications on file     Orlie Dakin, MD 09/23/16 1335

## 2016-09-27 MED FILL — AMLODIPINE BESYLATE 5 MG TA: 5 | 90 days supply | Qty: 90 | Fill #3

## 2016-11-21 ENCOUNTER — Other Ambulatory Visit: Payer: Self-pay | Admitting: Internal Medicine

## 2016-11-21 ENCOUNTER — Telehealth: Payer: Self-pay | Admitting: Internal Medicine

## 2016-11-21 DIAGNOSIS — M170 Bilateral primary osteoarthritis of knee: Secondary | ICD-10-CM

## 2016-11-21 MED ORDER — METOPROLOL SUCCINATE ER 25 MG PO TB24: 25.0000 mg | ORAL_TABLET | Freq: Two times a day (BID) | ORAL | 0 refills | Status: DC

## 2016-11-21 MED ORDER — AMLODIPINE BESYLATE 5 MG PO TABS: 5.0000 mg | ORAL_TABLET | Freq: Every day | ORAL | 0 refills | Status: DC

## 2016-11-21 MED ORDER — IBUPROFEN 800 MG PO TABS: 800.0000 mg | ORAL_TABLET | Freq: Three times a day (TID) | ORAL | 0 refills | Status: DC | PRN

## 2016-11-21 MED ORDER — PANTOPRAZOLE SODIUM 40 MG PO TBEC: 40.0000 mg | DELAYED_RELEASE_TABLET | Freq: Every day | ORAL | 0 refills | Status: DC

## 2016-11-21 MED FILL — METOPROLOL SUCC ER 25 MG TA: 25 | 90 days supply | Qty: 180 | Fill #0

## 2016-11-21 MED FILL — IBUPROFEN 800 MG TAB: 800 | 10 days supply | Qty: 30 | Fill #0

## 2016-11-21 MED FILL — PANTOPRAZOLE SOD DR 40 MG T: 40 | 90 days supply | Qty: 90 | Fill #0

## 2016-11-21 NOTE — Telephone Encounter (Addendum)
Pt has scheduled a follow up appt.

## 2016-11-21 NOTE — Addendum Note (Signed)
Addended by: Aviva Signs M on: 11/21/2016 04:17 PM   Modules accepted: Orders

## 2016-11-21 NOTE — Telephone Encounter (Signed)
Pt would like a refill of all of her meds.  Julie Clark Outpatient

## 2016-12-06 ENCOUNTER — Encounter: Payer: Self-pay | Admitting: Internal Medicine

## 2016-12-06 ENCOUNTER — Ambulatory Visit (INDEPENDENT_AMBULATORY_CARE_PROVIDER_SITE_OTHER): Payer: 59 | Admitting: Internal Medicine

## 2016-12-06 VITALS — BP 128/64 | HR 67 | Temp 97.8°F | Resp 16 | Ht 64.0 in | Wt 158.5 lb

## 2016-12-06 DIAGNOSIS — Z1231 Encounter for screening mammogram for malignant neoplasm of breast: Secondary | ICD-10-CM

## 2016-12-06 DIAGNOSIS — I10 Essential (primary) hypertension: Secondary | ICD-10-CM

## 2016-12-06 DIAGNOSIS — E785 Hyperlipidemia, unspecified: Secondary | ICD-10-CM | POA: Diagnosis not present

## 2016-12-06 DIAGNOSIS — K219 Gastro-esophageal reflux disease without esophagitis: Secondary | ICD-10-CM | POA: Insufficient documentation

## 2016-12-06 DIAGNOSIS — I480 Paroxysmal atrial fibrillation: Secondary | ICD-10-CM | POA: Diagnosis not present

## 2016-12-06 DIAGNOSIS — Z0000 Encounter for general adult medical examination without abnormal findings: Secondary | ICD-10-CM

## 2016-12-06 DIAGNOSIS — L732 Hidradenitis suppurativa: Secondary | ICD-10-CM | POA: Diagnosis not present

## 2016-12-06 DIAGNOSIS — M170 Bilateral primary osteoarthritis of knee: Secondary | ICD-10-CM | POA: Diagnosis not present

## 2016-12-06 MED ORDER — CLINDAMYCIN PHOSPHATE 1 % EX GEL: Freq: Two times a day (BID) | CUTANEOUS | 3 refills | Status: DC

## 2016-12-06 MED ORDER — IBUPROFEN 800 MG PO TABS: 800.0000 mg | ORAL_TABLET | Freq: Three times a day (TID) | ORAL | 3 refills | Status: DC | PRN

## 2016-12-06 MED ORDER — AMLODIPINE BESYLATE 5 MG PO TABS: 5.0000 mg | ORAL_TABLET | Freq: Every day | ORAL | 3 refills | Status: DC

## 2016-12-06 MED ORDER — ZOSTER VAC RECOMB ADJUVANTED 50 MCG/0.5ML IM SUSR: 0.5000 mL | Freq: Once | INTRAMUSCULAR | 1 refills | Status: AC

## 2016-12-06 MED ORDER — PANTOPRAZOLE SODIUM 40 MG PO TBEC: 40.0000 mg | DELAYED_RELEASE_TABLET | Freq: Every day | ORAL | 3 refills | Status: DC

## 2016-12-06 MED ORDER — METOPROLOL SUCCINATE ER 25 MG PO TB24: 25.0000 mg | ORAL_TABLET | Freq: Two times a day (BID) | ORAL | 3 refills | Status: DC

## 2016-12-06 MED FILL — CLINDAMYCIN PH 1% GEL: 1 | 30 days supply | Qty: 60 | Fill #0

## 2016-12-06 MED FILL — SHINGRIX 50 MCG SUS: 50 | 1 days supply | Qty: 1 | Fill #0

## 2016-12-06 NOTE — Patient Instructions (Signed)

## 2016-12-06 NOTE — Progress Notes (Addendum)
Subjective:  Patient ID: Julie Clark, female    DOB: 02/28/1956  Age: 61 y.o. MRN: [id number]  CC: Annual Exam; Hypertension; and Atrial Fibrillation   HPI REID NAWROT presents for a CPX - She continues developed cystic lesions in her axillary region and groin. She has tried minoxidil for several months without any relief from her symptoms. She wants to try a different treatment option. Several months ago she had an episode of atrial fibrillation with rapid ventricular response. She has had no recent episodes of palpitations and continues to reiterate that she does not want to be anticoagulated.  Outpatient Medications Prior to Visit  Medication Sig Dispense Refill  . aspirin EC 81 MG tablet Take 81 mg by mouth daily.    Marland Kitchen amLODipine (NORVASC) 5 MG tablet Take 1 tablet (5 mg total) by mouth daily. 90 tablet 0  . ibuprofen (ADVIL,MOTRIN) 800 MG tablet Take 1 tablet (800 mg total) by mouth every 8 (eight) hours as needed. 30 tablet 0  . metoprolol succinate (TOPROL-XL) 25 MG 24 hr tablet Take 1 tablet (25 mg total) by mouth 2 (two) times daily. 180 tablet 0  . minocycline (DYNACIN) 50 MG tablet Take 1 tablet (50 mg total) by mouth 2 (two) times daily. 60 tablet 1  . pantoprazole (PROTONIX) 40 MG tablet Take 1 tablet (40 mg total) by mouth daily. 90 tablet 0   No facility-administered medications prior to visit.     ROS Review of Systems  Constitutional: Negative.  Negative for diaphoresis, fatigue and unexpected weight change.  HENT: Negative.   Eyes: Negative.  Negative for visual disturbance.  Respiratory: Negative.  Negative for cough, chest tightness, shortness of breath and wheezing.   Cardiovascular: Negative.  Negative for chest pain, palpitations and leg swelling.  Gastrointestinal: Negative for abdominal pain, blood in stool, constipation, diarrhea, nausea and vomiting.  Endocrine: Negative.  Negative for cold intolerance and heat intolerance.  Genitourinary: Negative.   Negative for decreased urine volume, difficulty urinating, dysuria, urgency, vaginal bleeding and vaginal discharge.  Musculoskeletal: Positive for arthralgias. Negative for back pain and myalgias.  Skin: Negative.  Negative for color change, rash and wound.  Allergic/Immunologic: Negative.   Neurological: Negative.  Negative for dizziness, syncope, weakness and light-headedness.  Hematological: Negative for adenopathy. Does not bruise/bleed easily.  Psychiatric/Behavioral: Negative.     Objective:  BP 128/64 (BP Location: Left Arm, Patient Position: Sitting, Cuff Size: Normal)   Pulse 67   Temp 97.8 F (36.6 C) (Oral)   Resp 16   Ht 5' 4"  (1.626 m)   Wt 158 lb 8 oz (71.9 kg)   SpO2 99%   BMI 27.21 kg/m   BP Readings from Last 3 Encounters:  12/06/16 128/64  09/23/16 101/77  09/06/16 116/64    Wt Readings from Last 3 Encounters:  12/06/16 158 lb 8 oz (71.9 kg)  09/23/16 154 lb (69.9 kg)  09/06/16 165 lb 9.6 oz (75.1 kg)    Physical Exam  Constitutional: She is oriented to person, place, and time. No distress.  HENT:  Mouth/Throat: Oropharynx is clear and moist. No oropharyngeal exudate.  Eyes: Conjunctivae are normal. Right eye exhibits no discharge. Left eye exhibits no discharge. No scleral icterus.  Neck: Normal range of motion. Neck supple. No JVD present. No thyromegaly present.  Cardiovascular: Normal rate, regular rhythm and intact distal pulses.  Exam reveals no gallop.   No murmur heard. Pulmonary/Chest: Effort normal and breath sounds normal. No respiratory distress. She has  no wheezes. She has no rales. She exhibits no tenderness.  Abdominal: Soft. Bowel sounds are normal. She exhibits no distension and no mass. There is no tenderness. There is no rebound and no guarding.  Musculoskeletal: Normal range of motion. She exhibits no edema, tenderness or deformity.  Lymphadenopathy:    She has no cervical adenopathy.  Neurological: She is alert and oriented to  person, place, and time.  Skin: Skin is warm and dry. No rash noted. She is not diaphoretic. No erythema. No pallor.  Epidermal cystic lesions noted in the groin  Vitals reviewed.   Lab Results  Component Value Date   WBC 11.8 (H) 12/10/2016   HGB 12.5 12/10/2016   HCT 37.4 12/10/2016   PLT 398.0 12/10/2016   GLUCOSE 92 12/10/2016   CHOL 235 (H) 12/10/2016   TRIG 79.0 12/10/2016   HDL 45.50 12/10/2016   LDLCALC 174 (H) 12/10/2016   ALT 8 12/10/2016   AST 13 12/10/2016   NA 139 12/10/2016   K 4.0 12/10/2016   CL 102 12/10/2016   CREATININE 0.76 12/10/2016   BUN 13 12/10/2016   CO2 28 12/10/2016   TSH 0.75 12/10/2016    No results found.  Assessment & Plan:   Tylesha was seen today for annual exam, hypertension and atrial fibrillation.  Diagnoses and all orders for this visit:  Routine general medical examination at a health care facility- Exam completed, labs ordered, vaccines reviewed and updated, she deferred on a Pap smear this year, she is referred for screening mammogram, colon cancer screening is up-to-date, patient education material was given. -     Comprehensive metabolic panel; Future -     CBC with Differential/Platelet; Future -     Lipid panel; Future -     Thyroid Panel With TSH; Future -     Zoster Vac Recomb Adjuvanted [facility]) injection; Inject 0.5 mLs into the muscle once.  Hidradenitis suppurativa- we'll try topical clindamycin -     clindamycin (CLINDAGEL) 1 % gel; Apply topically 2 (two) times daily.  Visit for screening mammogram -     MM DIGITAL SCREENING BILATERAL; Future  Primary osteoarthritis of both knees -     ibuprofen (ADVIL,MOTRIN) 800 MG tablet; Take 1 tablet (800 mg total) by mouth every 8 (eight) hours as needed.  Essential hypertension, benign- her blood pressure is adequately well controlled -     amLODipine (NORVASC) 5 MG tablet; Take 1 tablet (5 mg total) by mouth daily. -     metoprolol succinate (TOPROL-XL) 25 MG 24 hr  tablet; Take 1 tablet (25 mg total) by mouth 2 (two) times daily.  Paroxysmal atrial fibrillation ([place])- she said good rate and rhythm control, will not anticoagulate her request. -     amLODipine (NORVASC) 5 MG tablet; Take 1 tablet (5 mg total) by mouth daily. -     metoprolol succinate (TOPROL-XL) 25 MG 24 hr tablet; Take 1 tablet (25 mg total) by mouth 2 (two) times daily.  GERD without esophagitis -     pantoprazole (PROTONIX) 40 MG tablet; Take 1 tablet (40 mg total) by mouth daily.   I have discontinued Ms. Boeder's minocycline. I am also having her start on clindamycin and Zoster Vac Recomb Adjuvanted. Additionally, I am having her maintain her aspirin EC, amLODipine, ibuprofen, pantoprazole, and metoprolol succinate.  Meds ordered this encounter  Medications  . clindamycin (CLINDAGEL) 1 % gel    Sig: Apply topically 2 (two) times daily.    Dispense:  60 g    Refill:  3  . Zoster Vac Recomb Adjuvanted [facility]) injection    Sig: Inject 0.5 mLs into the muscle once.    Dispense:  1 each    Refill:  1  . amLODipine (NORVASC) 5 MG tablet    Sig: Take 1 tablet (5 mg total) by mouth daily.    Dispense:  90 tablet    Refill:  3  . ibuprofen (ADVIL,MOTRIN) 800 MG tablet    Sig: Take 1 tablet (800 mg total) by mouth every 8 (eight) hours as needed.    Dispense:  30 tablet    Refill:  3  . pantoprazole (PROTONIX) 40 MG tablet    Sig: Take 1 tablet (40 mg total) by mouth daily.    Dispense:  90 tablet    Refill:  3  . metoprolol succinate (TOPROL-XL) 25 MG 24 hr tablet    Sig: Take 1 tablet (25 mg total) by mouth 2 (two) times daily.    Dispense:  180 tablet    Refill:  3     Follow-up: Return in about 1 year (around 12/06/2017).  Scarlette Calico, MD

## 2016-12-10 ENCOUNTER — Other Ambulatory Visit (INDEPENDENT_AMBULATORY_CARE_PROVIDER_SITE_OTHER): Payer: 59

## 2016-12-10 DIAGNOSIS — Z0000 Encounter for general adult medical examination without abnormal findings: Secondary | ICD-10-CM

## 2016-12-10 LAB — LIPID PANEL
CHOLESTEROL: 235 mg/dL — AB (ref 0–200)
HDL: 45.5 mg/dL (ref >39.00)
LDL Cholesterol: 174 mg/dL — ABNORMAL HIGH (ref 0–99)
NONHDL: 189.94
TRIGLYCERIDES: 79 mg/dL (ref 0.0–149.0)
Total CHOL/HDL Ratio: 5
VLDL: 15.8 mg/dL (ref 0.0–40.0)

## 2016-12-10 LAB — CBC WITH DIFFERENTIAL/PLATELET
BASOS ABS: 0.1 10*3/uL (ref 0.0–0.1)
Basophils Relative: 0.7 % (ref 0.0–3.0)
EOS ABS: 0.2 10*3/uL (ref 0.0–0.7)
Eosinophils Relative: 1.8 % (ref 0.0–5.0)
HCT: 37.4 % (ref 36.0–46.0)
Hemoglobin: 12.5 g/dL (ref 12.0–15.0)
LYMPHS ABS: 1.5 10*3/uL (ref 0.7–4.0)
Lymphocytes Relative: 13.1 % (ref 12.0–46.0)
MCHC: 33.4 g/dL (ref 30.0–36.0)
MCV: 86.3 fl (ref 78.0–100.0)
MONO ABS: 0.7 10*3/uL (ref 0.1–1.0)
Monocytes Relative: 6.1 % (ref 3.0–12.0)
NEUTROS PCT: 78.3 % — AB (ref 43.0–77.0)
Neutro Abs: 9.3 10*3/uL — ABNORMAL HIGH (ref 1.4–7.7)
PLATELETS: 398 10*3/uL (ref 150.0–400.0)
RBC: 4.34 Mil/uL (ref 3.87–5.11)
RDW: 13.5 % (ref 11.5–15.5)
WBC: 11.8 10*3/uL — ABNORMAL HIGH (ref 4.0–10.5)

## 2016-12-10 LAB — THYROID PANEL WITH TSH
FREE THYROXINE INDEX: 2.8 (ref 1.4–3.8)
T3 Uptake: 27 % (ref 22–35)
T4, Total: 10.4 ug/dL (ref 4.5–12.0)
TSH: 0.75 m[IU]/L

## 2016-12-10 LAB — COMPREHENSIVE METABOLIC PANEL
ALK PHOS: 100 U/L (ref 39–117)
ALT: 8 U/L (ref 0–35)
AST: 13 U/L (ref 0–37)
Albumin: 4.3 g/dL (ref 3.5–5.2)
BILIRUBIN TOTAL: 0.4 mg/dL (ref 0.2–1.2)
BUN: 13 mg/dL (ref 6–23)
CO2: 28 meq/L (ref 19–32)
CREATININE: 0.76 mg/dL (ref 0.40–1.20)
Calcium: 9.8 mg/dL (ref 8.4–10.5)
Chloride: 102 mEq/L (ref 96–112)
GFR: 99.59 mL/min (ref >60.00)
GLUCOSE: 92 mg/dL (ref 70–99)
Potassium: 4 mEq/L (ref 3.5–5.1)
Sodium: 139 mEq/L (ref 135–145)
TOTAL PROTEIN: 8 g/dL (ref 6.0–8.3)

## 2016-12-11 ENCOUNTER — Encounter: Payer: Self-pay | Admitting: Internal Medicine

## 2016-12-18 MED FILL — DOXYCYCLINE HYC 100 MG TAB: 100 | 14 days supply | Qty: 14 | Fill #0

## 2016-12-18 MED FILL — CEPHALEXIN 500 MG CAPSULE: 500 | 2 days supply | Qty: 8 | Fill #0

## 2016-12-18 MED FILL — CHLORHEXIDINE 0.12% RINSE: 0.12 | 27 days supply | Qty: 946 | Fill #0

## 2016-12-28 MED FILL — AMLODIPINE BESYLATE 5 MG TA: 5 | 90 days supply | Qty: 90 | Fill #0

## 2017-01-09 ENCOUNTER — Telehealth: Payer: Self-pay | Admitting: Cardiology

## 2017-01-09 NOTE — Telephone Encounter (Signed)
Spoke with patient and she states that she is in atrial fib with heart rates ranging between 80-110 & BP is 112/71 . Patient said she has been in a-fib since yesterday and it was confirmed with a monitor she used at her job. Patient said this is the second time she has been in a-fib this year. No c/o chest pain, dizziness or sob. Patient says she feels more tired while she is working.  Patient took her morning dose of toprol xl 25 mg prior to work and she took another one an hour later after she arrived to work.  BP is 112/71 Patient was offered an appointment to be seen by Roderic Palau in the morning @9 :30 am but declined stating she has to work in the morning and can't call out. Patient wants to know if she can take an additional tablet of her toprol xl 25 mg now in addition to her evening dose. Please advise.

## 2017-01-09 NOTE — Telephone Encounter (Signed)
New message   Pt states she is in Afib right now. She said it started yesterday. She is concerned because she has been in Afib for over 24 hours. She said she took an extra dose of metoprolol 25 mg with her regular morning dose. She wants to know if she can take another extra dose. She is due for another doe tonight. She said her BP is 112/71 and pulse is ranging between 80-110. She said no chest pain or symptoms just tired.

## 2017-01-09 NOTE — Telephone Encounter (Signed)
DOD informed and agreed that patient can take an extra 25 mg toprol xl today in addition to her evening dose. Patient informed and verbalized understanding of plan. Patient agreed to keep appointment in the [facility] in the morning.

## 2017-01-10 ENCOUNTER — Encounter (HOSPITAL_COMMUNITY): Payer: Self-pay | Admitting: Nurse Practitioner

## 2017-01-10 ENCOUNTER — Ambulatory Visit (HOSPITAL_COMMUNITY)
Admission: RE | Admit: 2017-01-10 | Discharge: 2017-01-10 | Disposition: A | Discharge status: Home / Self Care | Payer: 59 | Source: Ambulatory Visit | Attending: Nurse Practitioner | Admitting: Nurse Practitioner

## 2017-01-10 VITALS — BP 128/64 | HR 148 | Ht 64.0 in | Wt 155.6 lb

## 2017-01-10 DIAGNOSIS — Z823 Family history of stroke: Secondary | ICD-10-CM | POA: Insufficient documentation

## 2017-01-10 DIAGNOSIS — Z9889 Other specified postprocedural states: Secondary | ICD-10-CM | POA: Insufficient documentation

## 2017-01-10 DIAGNOSIS — E785 Hyperlipidemia, unspecified: Secondary | ICD-10-CM | POA: Diagnosis not present

## 2017-01-10 DIAGNOSIS — Z8249 Family history of ischemic heart disease and other diseases of the circulatory system: Secondary | ICD-10-CM | POA: Diagnosis not present

## 2017-01-10 DIAGNOSIS — Z800 Family history of malignant neoplasm of digestive organs: Secondary | ICD-10-CM | POA: Insufficient documentation

## 2017-01-10 DIAGNOSIS — Z880 Allergy status to penicillin: Secondary | ICD-10-CM | POA: Diagnosis not present

## 2017-01-10 DIAGNOSIS — Z7902 Long term (current) use of antithrombotics/antiplatelets: Secondary | ICD-10-CM | POA: Diagnosis not present

## 2017-01-10 DIAGNOSIS — I480 Paroxysmal atrial fibrillation: Secondary | ICD-10-CM | POA: Insufficient documentation

## 2017-01-10 DIAGNOSIS — I10 Essential (primary) hypertension: Secondary | ICD-10-CM | POA: Insufficient documentation

## 2017-01-10 DIAGNOSIS — Z8042 Family history of malignant neoplasm of prostate: Secondary | ICD-10-CM | POA: Insufficient documentation

## 2017-01-10 DIAGNOSIS — Z888 Allergy status to other drugs, medicaments and biological substances status: Secondary | ICD-10-CM | POA: Insufficient documentation

## 2017-01-10 MED ORDER — APIXABAN 5 MG PO TABS: 5.0000 mg | ORAL_TABLET | Freq: Two times a day (BID) | ORAL | 0 refills | Status: DC

## 2017-01-10 MED ORDER — DILTIAZEM HCL ER COATED BEADS 120 MG PO CP24: 120.0000 mg | ORAL_CAPSULE | Freq: Every day | ORAL | 3 refills | Status: DC

## 2017-01-10 MED ORDER — APIXABAN 5 MG PO TABS: 5.0000 mg | ORAL_TABLET | Freq: Two times a day (BID) | ORAL | 6 refills | Status: DC

## 2017-01-10 MED FILL — CARTIA XT 120 MG CAPSULE SA: 120 | 30 days supply | Qty: 30 | Fill #0

## 2017-01-10 MED FILL — ELIQUIS 5 MG TABLET: 5 | 30 days supply | Qty: 60 | Fill #0

## 2017-01-10 NOTE — Patient Instructions (Signed)
Your physician has recommended you make the following change in your medication:  1)Stop amlodipine 2)Start Cardizem 122m once a day 3)Stop aspirin 4)Start Eliquis 527mtwice a day

## 2017-01-10 NOTE — Progress Notes (Signed)
Primary Care Physician: Julie Lima, MD Referring Physician: [facility] triage EP: Julie Clark is a 61 y.o. female with a h/o first onset afib in February this year and converted with IV metoprolol. She was seen in f/u with Julie Clark in April. She felt metoprolol was controlling her symptoms and deferred anticoagulation with chadsvasc score of 2(female, HTN). She had one further short version of afib controlled with extra metoprolol.  Today, she is in the afib clinic for afib with rvr since Tuesday. She took extra metoprolol yesterday with no effect.  V rate is in the 140's. She tolerates fairly well just noticing fatigue.Discussed anticoagulation again and if afib shows to be persistent will need on board to try to restore SR. She is agreement to try starting drug, although, not too enthused with the idea. No bleeding history or risk. By guidelines, she should be on anticoagulation daily. 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  Today, she denies symptoms of palpitations, chest pain, shortness of breath, orthopnea, PND, lower extremity edema, dizziness, presyncope, syncope, or neurologic sequela. The patient is tolerating medications without difficulties and is otherwise without complaint today.   Past Medical History:  Diagnosis Date  . A-fib ([place])   . Heart murmur   . Hyperlipidemia   . Hypertension    Past Surgical History:  Procedure Laterality Date  . CESAREAN SECTION     x2    Current Outpatient Prescriptions  Medication Sig Dispense Refill  . clindamycin (CLINDAGEL) 1 % gel Apply topically 2 (two) times daily. 60 g 3  . ibuprofen (ADVIL,MOTRIN) 800 MG tablet Take 1 tablet (800 mg total) by mouth every 8 (eight) hours as needed. 30 tablet 3  . metoprolol succinate (TOPROL-XL) 25 MG 24 hr tablet Take 1 tablet (25 mg total) by mouth 2 (two) times daily. 180 tablet 3  . pantoprazole (PROTONIX) 40 MG tablet Take 1 tablet (40 mg total) by mouth daily.  90 tablet 3  . apixaban (ELIQUIS) 5 MG TABS tablet Take 1 tablet (5 mg total) by mouth 2 (two) times daily. 60 tablet 0  . diltiazem (CARDIZEM CD) 120 MG 24 hr capsule Take 1 capsule (120 mg total) by mouth daily. 30 capsule 3   No current facility-administered medications for this encounter.     Allergies  Allergen Reactions  . Lovastatin     Muscle aches  . Penicillins Rash    Has patient had a PCN reaction causing immediate rash, facial/tongue/throat swelling, SOB or lightheadedness with hypotension: unknown Has patient had a PCN reaction causing severe rash involving mucus membranes or skin necrosis: No  Has patient had a PCN reaction that required hospitalization: No Has patient had a PCN reaction occurring within the last 10 years: No  If all of the above answers are "NO", then may proceed with Cephalosporin use.     Social History   Social History  . Marital status: Married    Spouse name: N/A  . Number of children: N/A  . Years of education: N/A   Occupational History  . 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 Smoking status: Never Smoker  . Smokeless tobacco: Never Used  . Alcohol use No  . Drug use: No  . Sexual activity: Not Currently   Other Topics Concern  . Not on file   Social History Narrative  . No narrative on file    Family History  Problem Relation Age of Onset  . Stroke Mother   . Hypertension Mother   . Hyperlipidemia Mother   . Thyroid disease Mother   . Cancer Father        prostate  . Colon cancer Maternal Aunt   . Diabetes Neg Hx   . Depression Neg Hx   . Early death Neg Hx   . Hearing loss Neg Hx   . Heart disease Neg Hx   . Kidney disease Neg Hx   . Alcohol abuse Neg Hx     ROS- All systems are reviewed and negative except as per the HPI above  Physical Exam: Vitals:   01/10/17 0926  BP: 128/64  Pulse: (!) 148  Weight: 155 lb 9.6 oz (70.6 kg)  Height: 5' 4"  (1.626 m)   Wt Readings from Last 3  Encounters:  01/10/17 155 lb 9.6 oz (70.6 kg)  12/06/16 158 lb 8 oz (71.9 kg)  09/23/16 154 lb (69.9 kg)    Labs: Lab Results  Component Value Date   NA 139 12/10/2016   K 4.0 12/10/2016   CL 102 12/10/2016   CO2 28 12/10/2016   GLUCOSE 92 12/10/2016   BUN 13 12/10/2016   CREATININE 0.76 12/10/2016   CALCIUM 9.8 12/10/2016   MG 2.0 07/18/2015   No results found for: INR Lab Results  Component Value Date   CHOL 235 (H) 12/10/2016   HDL 45.50 12/10/2016   LDLCALC 174 (H) 12/10/2016   TRIG 79.0 12/10/2016     GEN- The patient is well appearing, alert and oriented x 3 today.   Head- normocephalic, atraumatic Eyes-  Sclera clear, conjunctiva pink Ears- hearing intact Oropharynx- clear Neck- supple, no JVP Lymph- no cervical lymphadenopathy Lungs- Clear to ausculation bilaterally, normal work of breathing Heart- irregular rate and rhythm, no murmurs, rubs or gallops, PMI not laterally displaced GI- soft, NT, ND, + BS Extremities- no clubbing, cyanosis, or edema MS- no significant deformity or atrophy Skin- no rash or lesion Psych- euthymic mood, full affect Neuro- strength and sensation are intact  EKG-afib at 148 bpm, qrs int 78 ms, qtc 461 ms Epic records reviewed Echo-Study Conclusions  - Left ventricle: The cavity size was normal. Wall thickness was normal. Systolic function was normal. The estimated ejection fraction was in the range of 55% to 60%. Wall motion was normal; there were no regional wall motion abnormalities. Doppler parameters are consistent with abnormal left ventricular relaxation (grade 1 diastolic dysfunction). - Aortic valve: Mean gradient: 72m Hg (S). Peak gradient: 197mHg (S). - Left atrium: The atrium was mildly dilated. - Pulmonary arteries: PA peak pressure: 3277mg (S).   Assessment and Plan: 1. Paroxysmal afib Episode with rvr since Tuesday Will continue metoprolol ER 25 mg bid Will stop amlodipine and add  Cardizem 120 mg qd for better rate control and encourage SR  2. Chadsvasc score of 2 Pt has been resistant to start anticoagulation, risk of stroke discussed  No bleeding history  Bleeding precautions discussed Explained if this episode of afib proved to be persistent, cardioversion would be needed to restore SR and anticoagulation would have to be on board prior to procedure  She is willing to start Will rx eliquis 5 mg bid  Have asked to avoid antiinflammatories  She will stop asa  She will watch BP/HR and will have come back in one week for V/S and EKG  Julie Clark C. CarMila HomeriPresque Isle Harbor Hospital0[place]  [place] [address] [phone]

## 2017-01-17 ENCOUNTER — Ambulatory Visit (HOSPITAL_COMMUNITY)
Admission: RE | Admit: 2017-01-17 | Discharge: 2017-01-17 | Disposition: A | Discharge status: Home / Self Care | Payer: 59 | Source: Ambulatory Visit | Attending: Nurse Practitioner | Admitting: Nurse Practitioner

## 2017-01-17 ENCOUNTER — Encounter (HOSPITAL_COMMUNITY): Payer: Self-pay | Admitting: Nurse Practitioner

## 2017-01-17 VITALS — BP 138/68 | HR 70

## 2017-01-17 DIAGNOSIS — I480 Paroxysmal atrial fibrillation: Secondary | ICD-10-CM | POA: Diagnosis not present

## 2017-01-17 NOTE — Progress Notes (Addendum)
Pt in for repeat EKG today since starting the diltiazem and stopping the amlodipine.  Pt BP today is 138/68 and EKG HR 70.  EKG to be reviewed by Roderic Palau, NP  Pt doing well with changing CBB's to encourage SR. No further afib. Started Doac and CBC will be drawn in one month, order given.  F/u with Dr. Curt Bears 3-4 months.  EKG shows NSR, at 70 bpm, PR int 144 ms, qrs int 82 ms, qtc 419 ms.  Afib clinic as needed.

## 2017-02-04 MED FILL — CARTIA XT 120 MG CAPSULE SA: 120 | 30 days supply | Qty: 30 | Fill #1

## 2017-02-05 MED FILL — ELIQUIS 5 MG TABLET: 5 | 30 days supply | Qty: 60 | Fill #0

## 2017-02-12 ENCOUNTER — Encounter: Payer: Self-pay | Admitting: Internal Medicine

## 2017-02-12 ENCOUNTER — Other Ambulatory Visit (INDEPENDENT_AMBULATORY_CARE_PROVIDER_SITE_OTHER): Payer: 59

## 2017-02-12 ENCOUNTER — Telehealth: Payer: Self-pay | Admitting: Emergency Medicine

## 2017-02-12 ENCOUNTER — Other Ambulatory Visit: Payer: Self-pay | Admitting: Internal Medicine

## 2017-02-12 DIAGNOSIS — I480 Paroxysmal atrial fibrillation: Secondary | ICD-10-CM

## 2017-02-12 DIAGNOSIS — I10 Essential (primary) hypertension: Secondary | ICD-10-CM

## 2017-02-12 LAB — CBC WITH DIFFERENTIAL/PLATELET
BASOS PCT: 0.9 % (ref 0.0–3.0)
Basophils Absolute: 0.1 10*3/uL (ref 0.0–0.1)
EOS PCT: 3.6 % (ref 0.0–5.0)
Eosinophils Absolute: 0.4 10*3/uL (ref 0.0–0.7)
HCT: 35.6 % — ABNORMAL LOW (ref 36.0–46.0)
HEMOGLOBIN: 11.5 g/dL — AB (ref 12.0–15.0)
LYMPHS ABS: 1.5 10*3/uL (ref 0.7–4.0)
Lymphocytes Relative: 15.2 % (ref 12.0–46.0)
MCHC: 32.3 g/dL (ref 30.0–36.0)
MCV: 87.6 fl (ref 78.0–100.0)
MONO ABS: 0.6 10*3/uL (ref 0.1–1.0)
Monocytes Relative: 5.7 % (ref 3.0–12.0)
NEUTROS ABS: 7.4 10*3/uL (ref 1.4–7.7)
NEUTROS PCT: 74.6 % (ref 43.0–77.0)
PLATELETS: 383 10*3/uL (ref 150.0–400.0)
RBC: 4.06 Mil/uL (ref 3.87–5.11)
RDW: 13.9 % (ref 11.5–15.5)
WBC: 9.9 10*3/uL (ref 4.0–10.5)

## 2017-02-12 LAB — BASIC METABOLIC PANEL
BUN: 15 mg/dL (ref 6–23)
CALCIUM: 9.9 mg/dL (ref 8.4–10.5)
CO2: 29 mEq/L (ref 19–32)
Chloride: 103 mEq/L (ref 96–112)
Creatinine, Ser: 0.64 mg/dL (ref 0.40–1.20)
GFR: 121.36 mL/min (ref >60.00)
Glucose, Bld: 83 mg/dL (ref 70–99)
Potassium: 3.8 mEq/L (ref 3.5–5.1)
SODIUM: 140 meq/L (ref 135–145)

## 2017-02-12 NOTE — Telephone Encounter (Signed)
Dr Ronnald Ramp put in a CBC order. Patient asked if the results could be faxed to Roderic Palau, NP [phone]. Thanks.

## 2017-02-12 NOTE — Telephone Encounter (Signed)
Pt informed of same. Pt will call back if Kayleen Memos is not able to see the results.

## 2017-02-12 NOTE — Telephone Encounter (Signed)
Julie Clark should be able to see it in EPIC, I am able to see her order. Pt needs to have the blood work done before any results can be sent.

## 2017-02-20 MED FILL — METOPROLOL SUCC ER 25 MG TA: 25 | 90 days supply | Qty: 180 | Fill #0

## 2017-03-11 MED FILL — IBUPROFEN 800 MG TABS: 800 | 10 days supply | Qty: 30 | Fill #0

## 2017-03-11 MED FILL — CARTIA XT 120 MG CAPSULE SA: 120 | 30 days supply | Qty: 30 | Fill #2

## 2017-03-11 MED FILL — ELIQUIS 5 MG TABLET: 5 | 30 days supply | Qty: 60 | Fill #1

## 2017-03-14 ENCOUNTER — Ambulatory Visit
Admission: RE | Admit: 2017-03-14 | Discharge: 2017-03-14 | Disposition: A | Discharge status: Home / Self Care | Payer: 59 | Source: Ambulatory Visit | Attending: Internal Medicine | Admitting: Internal Medicine

## 2017-03-14 DIAGNOSIS — Z1231 Encounter for screening mammogram for malignant neoplasm of breast: Secondary | ICD-10-CM

## 2017-03-14 LAB — HM MAMMOGRAPHY

## 2017-03-15 ENCOUNTER — Other Ambulatory Visit: Payer: Self-pay | Admitting: Internal Medicine

## 2017-03-15 DIAGNOSIS — R928 Other abnormal and inconclusive findings on diagnostic imaging of breast: Secondary | ICD-10-CM

## 2017-03-18 ENCOUNTER — Other Ambulatory Visit: Payer: Self-pay | Admitting: Internal Medicine

## 2017-03-18 ENCOUNTER — Ambulatory Visit
Admission: RE | Admit: 2017-03-18 | Discharge: 2017-03-18 | Disposition: A | Discharge status: Home / Self Care | Payer: 59 | Source: Ambulatory Visit | Attending: Internal Medicine | Admitting: Internal Medicine

## 2017-03-18 DIAGNOSIS — R922 Inconclusive mammogram: Secondary | ICD-10-CM | POA: Diagnosis not present

## 2017-03-18 DIAGNOSIS — N6489 Other specified disorders of breast: Secondary | ICD-10-CM | POA: Diagnosis not present

## 2017-03-18 DIAGNOSIS — N6310 Unspecified lump in the right breast, unspecified quadrant: Secondary | ICD-10-CM

## 2017-03-18 DIAGNOSIS — R928 Other abnormal and inconclusive findings on diagnostic imaging of breast: Secondary | ICD-10-CM

## 2017-03-21 ENCOUNTER — Other Ambulatory Visit: Payer: 59

## 2017-04-09 MED FILL — CARTIA XT 120 MG CAPSULE SA: 120 | 30 days supply | Qty: 30 | Fill #3

## 2017-04-09 MED FILL — ELIQUIS 5 MG TABLET: 5 | 30 days supply | Qty: 60 | Fill #2

## 2017-04-22 ENCOUNTER — Ambulatory Visit: Payer: 59 | Admitting: Cardiology

## 2017-04-22 ENCOUNTER — Encounter: Payer: Self-pay | Admitting: Cardiology

## 2017-04-22 VITALS — BP 148/70 | HR 66 | Ht 64.0 in | Wt 158.0 lb

## 2017-04-22 DIAGNOSIS — I10 Essential (primary) hypertension: Secondary | ICD-10-CM

## 2017-04-22 DIAGNOSIS — I480 Paroxysmal atrial fibrillation: Secondary | ICD-10-CM | POA: Diagnosis not present

## 2017-04-22 MED ORDER — FLECAINIDE ACETATE 150 MG PO TABS: 300.0000 mg | ORAL_TABLET | Freq: Every day | ORAL | 1 refills | Status: DC | PRN

## 2017-04-22 MED FILL — FLECAINIDE ACETATE 150 MG T: 150 | 5 days supply | Qty: 10 | Fill #0

## 2017-04-22 NOTE — Patient Instructions (Addendum)
Medication Instructions:  Your physician has recommended you make the following change in your medication: 1. TAKE FLECAINIDE 300 mg once daily as needed for Atrial Fibrillation             (Pill-in-a-Pocket)  * If you need a refill on your cardiac medications before your next appointment, please call your pharmacy. *  Labwork: None ordered  Testing/Procedures: None ordered  Follow-Up: Your physician wants you to follow-up in: 6 months with Dr. Curt Bears.  You will receive a reminder letter in the mail two months in advance. If you don't receive a letter, please call our office to schedule the follow-up appointment.  Thank you for choosing [facility]!!   Trinidad Curet, RN [phone]  Any Other Special Instructions Will Be Listed Below (If Applicable).  Flecainide tablets What is this medicine? FLECAINIDE (FLEK a nide) is an antiarrhythmic drug. This medicine is used to prevent irregular heart rhythm. It can also slow down fast heartbeats called tachycardia. This medicine may be used for other purposes; ask your health care provider or pharmacist if you have questions. COMMON BRAND NAME(S): Tambocor What should I tell my health care provider before I take this medicine? They need to know if you have any of these conditions: -abnormal levels of potassium in the blood -heart disease including heart rhythm and heart rate problems -kidney or liver disease -recent heart attack -an unusual or allergic reaction to flecainide, local anesthetics, other medicines, foods, dyes, or preservatives -pregnant or trying to get pregnant -breast-feeding How should I use this medicine? Take this medicine by mouth with a glass of water. Follow the directions on the prescription label. You can take this medicine with or without food. Take your doses at regular intervals. Do not take your medicine more often than directed. Do not stop taking this medicine suddenly. This may cause serious,  heart-related side effects. If your doctor wants you to stop the medicine, the dose may be slowly lowered over time to avoid any side effects. Talk to your pediatrician regarding the use of this medicine in children. While this drug may be prescribed for children as young as 1 year of age for selected conditions, precautions do apply. Overdosage: If you think you have taken too much of this medicine contact a poison control center or emergency room at once. NOTE: This medicine is only for you. Do not share this medicine with others. What if I miss a dose? If you miss a dose, take it as soon as you can. If it is almost time for your next dose, take only that dose. Do not take double or extra doses. What may interact with this medicine? Do not take this medicine with any of the following medications: -amoxapine -arsenic trioxide -certain antibiotics like clarithromycin, erythromycin, gatifloxacin, gemifloxacin, levofloxacin, moxifloxacin, sparfloxacin, or troleandomycin -certain antidepressants called tricyclic antidepressants like amitriptyline, imipramine, or nortriptyline -certain medicines to control heart rhythm like disopyramide, dofetilide, encainide, moricizine, procainamide, propafenone, and quinidine -cisapride -cyclobenzaprine -delavirdine -droperidol -haloperidol -hawthorn -imatinib -levomethadyl -maprotiline -medicines for malaria like chloroquine and halofantrine -pentamidine -phenothiazines like chlorpromazine, mesoridazine, prochlorperazine, thioridazine -pimozide -quinine -ranolazine -ritonavir -sertindole -ziprasidone This medicine may also interact with the following medications: -cimetidine -medicines for angina or high blood pressure -medicines to control heart rhythm like amiodarone and digoxin This list may not describe all possible interactions. Give your health care provider a list of all the medicines, herbs, non-prescription drugs, or dietary supplements  you use. Also tell them if you smoke, drink alcohol,  or use illegal drugs. Some items may interact with your medicine. What should I watch for while using this medicine? Visit your doctor or health care professional for regular checks on your progress. Because your condition and the use of this medicine carries some risk, it is a good idea to carry an identification card, necklace or bracelet with details of your condition, medications and doctor or health care professional. Check your blood pressure and pulse rate regularly. Ask your health care professional what your blood pressure and pulse rate should be, and when you should contact him or her. Your doctor or health care professional also may schedule regular blood tests and electrocardiograms to check your progress. You may get drowsy or dizzy. Do not drive, use machinery, or do anything that needs mental alertness until you know how this medicine affects you. Do not stand or sit up quickly, especially if you are an older patient. This reduces the risk of dizzy or fainting spells. Alcohol can make you more dizzy, increase flushing and rapid heartbeats. Avoid alcoholic drinks. What side effects may I notice from receiving this medicine? Side effects that you should report to your doctor or health care professional as soon as possible: -chest pain, continued irregular heartbeats -difficulty breathing -swelling of the legs or feet -trembling, shaking -unusually weak or tired Side effects that usually do not require medical attention (report to your doctor or health care professional if they continue or are bothersome): -blurred vision -constipation -headache -nausea, vomiting -stomach pain This list may not describe all possible side effects. Call your doctor for medical advice about side effects. You may report side effects to FDA at 1-800-FDA-1088. Where should I keep my medicine? Keep out of the reach of children. Store at room temperature  between 15 and 30 degrees C (59 and 86 degrees F). Protect from light. Keep container tightly closed. Throw away any unused medicine after the expiration date. NOTE: This sheet is a summary. It may not cover all possible information. If you have questions about this medicine, talk to your doctor, pharmacist, or health care provider.  2018 Elsevier/Gold Standard (2007-09-17 16:46:09)

## 2017-04-22 NOTE — Progress Notes (Signed)
Electrophysiology Office Note   Date:  04/22/2017   ID:  Julie Clark, DOB 1956-05-04, MRN [id number]  PCP:  Julie Lima, MD  Primary Electrophysiologist:  Julie Haw, MD    Chief Complaint  Patient presents with  . Follow-up    PAF     History of Present Illness: Julie Clark is a 61 y.o. female who presents today for electrophysiology evaluation.    She has a history of hypertension, hyperlipidemia and she has heart murmur. She presented to the emergency room in February with low blood pressure. She was found to be in atrial fibrillation. She was given beta blockers which converted to sinus rhythm.  Another episode of atrial fibrillation in August 2018.  She was put on both metoprolol and Cardizem.  He did convert to sinus rhythm without intervention.   Today, denies symptoms of palpitations, chest pain, shortness of breath, orthopnea, PND, lower extremity edema, claudication, dizziness, presyncope, syncope, bleeding, or neurologic sequela. The patient is tolerating medications without difficulties.     Past Medical History:  Diagnosis Date  . A-fib ([place])   . Heart murmur   . Hyperlipidemia   . Hypertension    Past Surgical History:  Procedure Laterality Date  . CESAREAN SECTION     x2     Current Outpatient Medications  Medication Sig Dispense Refill  . apixaban (ELIQUIS) 5 MG TABS tablet Take 1 tablet (5 mg total) by mouth 2 (two) times daily. 60 tablet 6  . clindamycin (CLINDAGEL) 1 % gel Apply topically 2 (two) times daily. 60 g 3  . diltiazem (CARDIZEM CD) 120 MG 24 hr capsule Take 1 capsule (120 mg total) by mouth daily. 30 capsule 3  . metoprolol succinate (TOPROL-XL) 25 MG 24 hr tablet Take 1 tablet (25 mg total) by mouth 2 (two) times daily. 180 tablet 3  . pantoprazole (PROTONIX) 40 MG tablet Take 1 tablet (40 mg total) by mouth daily. 90 tablet 3   No current facility-administered medications for this visit.     Allergies:   Lovastatin  and Penicillins   Social History:  The patient  reports that  has never smoked. she has never used smokeless tobacco. She reports that she does not drink alcohol or use drugs.   Family History:  The patient's family history includes Cancer in her father; Colon cancer in her maternal aunt; Hyperlipidemia in her mother; Hypertension in her mother; Stroke in her mother; Thyroid disease in her mother.    ROS:  Please see the history of present illness.   Otherwise, review of systems is positive for none.   All other systems are reviewed and negative.   PHYSICAL EXAM: VS:  BP (!) 148/70   Pulse 66   Ht 5' 4"  (1.626 m)   Wt 158 lb (71.7 kg)   BMI 27.12 kg/m  , BMI Body mass index is 27.12 kg/m. GEN: Well nourished, well developed, in no acute distress  HEENT: normal  Neck: no JVD, carotid bruits, or masses Cardiac: RRR; no murmurs, rubs, or gallops,no edema  Respiratory:  clear to auscultation bilaterally, normal work of breathing GI: soft, nontender, nondistended, + BS MS: no deformity or atrophy  Skin: warm and dry Neuro:  Strength and sensation are intact Psych: euthymic mood, full affect  EKG:  EKG is not ordered today. Personal review of the ekg ordered 01/17/17 shows SR, PRWP   Recent Labs: 12/10/2016: ALT 8; TSH 0.75 02/12/2017: BUN 15; Creatinine, Ser 0.64;  Hemoglobin 11.5; Platelets 383.0; Potassium 3.8; Sodium 140    Lipid Panel     Component Value Date/Time   CHOL 235 (H) 12/10/2016 0730   TRIG 79.0 12/10/2016 0730   HDL 45.50 12/10/2016 0730   CHOLHDL 5 12/10/2016 0730   VLDL 15.8 12/10/2016 0730   LDLCALC 174 (H) 12/10/2016 0730     Wt Readings from Last 3 Encounters:  04/22/17 158 lb (71.7 kg)  01/10/17 155 lb 9.6 oz (70.6 kg)  12/06/16 158 lb 8 oz (71.9 kg)      Other studies Reviewed: Additional studies/ records that were reviewed today include: TTE 10/09/15  Review of the above records today demonstrates:  - Left ventricle: The cavity size was normal.  Wall thickness was normal. Systolic function was normal. The estimated ejection fraction was in the range of 55% to 60%. Wall motion was normal; there were no regional wall motion abnormalities. Doppler parameters are consistent with abnormal left ventricular relaxation (grade 1 diastolic dysfunction). - Aortic valve: Mean gradient: 31m Hg (S). Peak gradient: 159mHg (S). - Left atrium: The atrium was mildly dilated. - Pulmonary arteries: PA peak pressure: 3268mg (S).   ASSESSMENT AND PLAN:  1.   Paroxysmal atrial fibrillation: Currently on Eliquis.  Had an episode of atrial fibrillation and converted to sinus rhythm on her own.  Has had 2 episodes in the last year.  She would prefer not to take a daily medication.  We Julie Clark start her on flecainide as a pill in the pocket dose at 300 mg.  This patients CHA2DS2-VASc Score and unadjusted Ischemic Stroke Rate (% per year) is equal to 2.2 % stroke rate/year from a score of 2  Above score calculated as 1 point each if present [CHF, HTN, DM, Vascular=MI/PAD/Aortic Plaque, Age if 65-74, or Female] Above score calculated as 2 points each if present [Age > 75, or Stroke/TIA/TE]   2. Hypertension: I Julie Clark be elevated today but has been well controlled in the past.  Current medicines are reviewed at length with the patient today.   The patient does not have concerns regarding her medicines.  The following changes were made today:  Flecainide pill in pocket  Labs/ tests ordered today include:  No orders of the defined types were placed in this encounter.    Disposition:   FU with Julie Clark 6 months  Signed, Julie Clark Julie Clark  04/22/2017 11:39 AM     [facility] [address] [place] [phone]) [phone])

## 2017-05-11 MED FILL — ELIQUIS 5 MG TABLET: 5 | 30 days supply | Qty: 60 | Fill #3

## 2017-05-13 ENCOUNTER — Other Ambulatory Visit (HOSPITAL_COMMUNITY): Payer: Self-pay | Admitting: Nurse Practitioner

## 2017-05-13 ENCOUNTER — Other Ambulatory Visit: Payer: Self-pay | Admitting: Cardiology

## 2017-05-13 MED ORDER — DILTIAZEM HCL ER COATED BEADS 120 MG PO CP24: 120.0000 mg | ORAL_CAPSULE | Freq: Every day | ORAL | 11 refills | Status: DC

## 2017-05-13 MED FILL — CARTIA XT 120 MG CAPSULE SA: 120 | 30 days supply | Qty: 30 | Fill #0

## 2017-05-23 ENCOUNTER — Ambulatory Visit (INDEPENDENT_AMBULATORY_CARE_PROVIDER_SITE_OTHER): Payer: 59 | Admitting: *Deleted

## 2017-05-23 DIAGNOSIS — Z23 Encounter for immunization: Secondary | ICD-10-CM

## 2017-05-24 MED FILL — PANTOPRAZOLE SOD DR 40 MG T: 40 | 90 days supply | Qty: 90 | Fill #0

## 2017-05-24 MED FILL — METOPROLOL SUCC ER 25 MG TA: 25 | 90 days supply | Qty: 180 | Fill #1

## 2017-06-06 ENCOUNTER — Other Ambulatory Visit: Payer: Self-pay | Admitting: Internal Medicine

## 2017-06-06 DIAGNOSIS — K644 Residual hemorrhoidal skin tags: Secondary | ICD-10-CM

## 2017-06-06 DIAGNOSIS — K648 Other hemorrhoids: Principal | ICD-10-CM

## 2017-06-06 MED ORDER — PRAMOXINE-HC 1-1 % EX CREA: TOPICAL_CREAM | Freq: Three times a day (TID) | CUTANEOUS | 1 refills | Status: DC

## 2017-06-06 MED ORDER — HYDROCORTISONE ACETATE 25 MG RE SUPP: 25.0000 mg | Freq: Two times a day (BID) | RECTAL | 1 refills | Status: DC

## 2017-06-06 MED FILL — CARTIA XT 120 MG CAPSULE SA: 120 | 30 days supply | Qty: 30 | Fill #1

## 2017-06-06 MED FILL — ELIQUIS 5 MG TABLET: 5 | 30 days supply | Qty: 60 | Fill #4

## 2017-06-06 MED FILL — HEMMOREX-HC 25 MG SUPP: 25 | 6 days supply | Qty: 12 | Fill #0

## 2017-06-06 MED FILL — PRAMOSONE 1-1 % CREA: 1-1 | 10 days supply | Qty: 28 | Fill #0

## 2017-06-06 NOTE — Progress Notes (Signed)
Patient of Dr. Ardis Hughs who contacted me regarding hemorrhoids. She has a history of internal and external hemorrhoids, bothering her over the last 1-2 months with prolapse, perianal pain, itching and intermittent bleeding She is on Eliquis given her history of atrial fibrillation  She was able to show me a picture of her hemorrhoids which appeared to be either prolapsing internal plus external or predominantly external hemorrhoids which were large  She states Dr. Ardis Hughs recommended surgery first, but that banding may be an option if felt amenable.  Given the size of her hemorrhoids surgery is the best definitive therapy, however given her reluctance internal hemorrhoidal banding could be performed to see if shrinking and treating internal hemorrhoids helps also treat her external symptoms and lessen the size of her external hemorrhoids.  The Eliquis complicates banding and raises the risks of a post banding ulceration bleed.  If we were to undertake banding we would need to talk to her cardiologist to seek permission to hold Eliquis after banding visits.  In the interim she is interested in additional medical therapy while she decides between surgery and banding  We will try Analpram cream externally and hydrocortisone suppository 25 mg twice daily times 5 days for internal hemorrhoids

## 2017-07-01 ENCOUNTER — Telehealth: Payer: Self-pay | Admitting: Internal Medicine

## 2017-07-01 NOTE — Telephone Encounter (Signed)
Copied from [place] [id number]. Topic: General - Other >> Jul 01, 2017  8:34 AM Carolyn Stare wrote:  Dr Neila Gear called to ask if Dr Ronnald Ramp would contact him concerning this patient. He will doing some gum surgery on this patient and would like to discuss (ELIQUIS) 5 MG TABS tablet is wanting to know if and when pt should stop taking the Eliquis   Phone number [phone] would like a call today if possible

## 2017-07-01 NOTE — Telephone Encounter (Signed)
Called and spoke to Dr. Neila Gear and informed of PCP response. Dr. Neila Gear will advise patient on PCP instructions.

## 2017-07-01 NOTE — Telephone Encounter (Signed)
Stop taking Eliquis 3 days before the anticipated surgery Restart Eliquis when the surgeon thinks that the risk of bleeding has resolved

## 2017-07-01 NOTE — Telephone Encounter (Signed)
Should pt stop eliquis prior to dental procedure?

## 2017-07-02 MED FILL — CHLORHEXIDINE 0.12% RINSE: 0.12 | 16 days supply | Qty: 473 | Fill #0

## 2017-07-02 MED FILL — metroNIDAZOLE 250 MG TABS: 250 | 10 days supply | Qty: 30 | Fill #0

## 2017-07-02 MED FILL — ACETAMINOPHEN/COD #3 TABLET: 300-30 | 2 days supply | Qty: 10 | Fill #0

## 2017-07-05 ENCOUNTER — Telehealth: Payer: Self-pay | Admitting: Cardiology

## 2017-07-05 NOTE — Telephone Encounter (Signed)
Informed Dr. Neila Gear of office recommendations, earlier this morning, when patient called [place]. Advised that patient should not need to stop her Eliquis more than 1-2 days.  He asked what she should do if she experiences this again prior to rescheduled procedure. Instructed that pt should take Flecainide pill-in-the-pocket as instructed. Dr. Neila Gear appreciative of call/information.

## 2017-07-05 NOTE — Telephone Encounter (Signed)
New message   Dr. Neila Gear says that he was suppose to do surgery on the pt and she went into Afib and wants to know the protocol for rescheduling

## 2017-07-05 NOTE — Telephone Encounter (Signed)
Pt called- she has been off her Eliquis for 3 days. Today she woke up in AF. She has Flecainide pill in pocket and wanted to know if she could take it since she has been off Eliquis. I spoke with Dr Curt Bears. OK to to take Flecainide. She will reschedule her surgery, it elective, and resume Eliquis as well.  Kerin Ransom PA-C 07/05/2017 7:38 AM

## 2017-07-12 MED FILL — CARTIA XT 120 MG CAPSULE SA: 120 | 30 days supply | Qty: 30 | Fill #2

## 2017-07-12 MED FILL — ELIQUIS 5 MG TABLET: 5 | 30 days supply | Qty: 60 | Fill #5

## 2017-08-05 MED FILL — IBUPROFEN 800 MG TAB: 800 | 10 days supply | Qty: 30 | Fill #1

## 2017-08-07 MED FILL — ELIQUIS 5 MG TABLET: 5 | 30 days supply | Qty: 60 | Fill #6

## 2017-08-07 MED FILL — CARTIA XT 120 MG CAPSULE SA: 120 | 30 days supply | Qty: 30 | Fill #3

## 2017-08-09 MED FILL — HYDROCORTISONE AC 25 MG SUP: 25 | 6 days supply | Qty: 12 | Fill #1

## 2017-08-16 ENCOUNTER — Telehealth: Payer: Self-pay

## 2017-08-16 NOTE — Telephone Encounter (Signed)
Julie Banister, MD  Pyrtle, Julie Lines, MD; Julie Massed, RN        Thanks   dj   Previous Messages    ----- Message -----  From: Julie Bears, MD  Sent: 08/15/2017  1:32 PM  To: Julie Banister, MD, Julie Massed, RN  Subject: [facility] appt                     Julie Clark,  I was in [place] this pm and Julie Clark says she is ready for definitive hemorrhoidal surgery.  Previously recommended by Dr. Ardis Hughs.  Internal and external hemorrhoids with pain and bleeding.   Julie Clark,  Please refer to [facility] for hemorrhoidal surgery. ASAP please (though she waited too long).  I would say Gross or Marcello Moores, but defer to Dr. Ardis Hughs' opinion. She is his patient.  JMP      Referral has been made to [facility] referral faxed

## 2017-08-19 ENCOUNTER — Telehealth: Payer: Self-pay

## 2017-08-19 NOTE — Telephone Encounter (Signed)
Pt scheduled to see Dr. Johney Maine 09/03/17@8 :45am, pt to arrive there at 8:15am. Pt will receive packet in mail from [place] office.

## 2017-08-23 MED FILL — METOPROLOL SUCCINATE ER 25: 25 | 90 days supply | Qty: 180 | Fill #2

## 2017-09-02 ENCOUNTER — Telehealth: Payer: Self-pay | Admitting: Gastroenterology

## 2017-09-02 ENCOUNTER — Other Ambulatory Visit: Payer: 59

## 2017-09-02 DIAGNOSIS — R197 Diarrhea, unspecified: Secondary | ICD-10-CM

## 2017-09-02 NOTE — Telephone Encounter (Signed)
Julie Clark has been noticed a change in her bowels (less formed) with abdominal discomfort. This has been going on about a month. Started shortly after abx for dental procedure.   [name], Can you put in stool testing: (c. Diff by pcr, toxin.  Also gi pathogen panel).  Thanks

## 2017-09-03 ENCOUNTER — Other Ambulatory Visit: Payer: 59

## 2017-09-03 DIAGNOSIS — R197 Diarrhea, unspecified: Secondary | ICD-10-CM | POA: Diagnosis not present

## 2017-09-03 DIAGNOSIS — K643 Fourth degree hemorrhoids: Secondary | ICD-10-CM | POA: Diagnosis not present

## 2017-09-03 DIAGNOSIS — K6289 Other specified diseases of anus and rectum: Secondary | ICD-10-CM | POA: Diagnosis not present

## 2017-09-03 DIAGNOSIS — L0591 Pilonidal cyst without abscess: Secondary | ICD-10-CM | POA: Diagnosis not present

## 2017-09-03 MED FILL — PROCTOZONE-HC 2.5 % CREA: 2.5 | 15 days supply | Qty: 30 | Fill #0

## 2017-09-04 ENCOUNTER — Telehealth: Payer: Self-pay

## 2017-09-04 NOTE — Telephone Encounter (Signed)
   [facility] Pre-operative Risk Assessment    Request for surgical clearance:  1. What type of surgery is being performed? Hemorrhoidectomy    2. When is this surgery scheduled? TBD   3. What type of clearance is required (medical clearance vs. Pharmacy clearance to hold med vs. Both)? Both  4. Are there any medications that need to be held prior to surgery and how long? Need instructions as to how the pt should hold Eliquis Preoperatively    5. Practice name and name of physician performing surgery? [facility], Dr. Michael Boston   6. What is your office phone number ? [phone]   7.   What is your office fax number [phone] Attn: Alysia Penna Spellers  8.   Anesthesia type (None, local, MAC, general) ? General    Mendel Ryder 09/04/2017, 11:03 AM  _________________________________________________________________   (provider comments below)

## 2017-09-05 ENCOUNTER — Other Ambulatory Visit (HOSPITAL_COMMUNITY): Payer: Self-pay | Admitting: Surgery

## 2017-09-05 DIAGNOSIS — K6289 Other specified diseases of anus and rectum: Secondary | ICD-10-CM

## 2017-09-05 LAB — GASTROINTESTINAL PATHOGEN PANEL PCR
C. DIFFICILE TOX A/B, PCR: NOT DETECTED
CAMPYLOBACTER, PCR: NOT DETECTED
CRYPTOSPORIDIUM, PCR: NOT DETECTED
E COLI 0157, PCR: NOT DETECTED
E coli (ETEC) LT/ST PCR: NOT DETECTED
E coli (STEC) stx1/stx2, PCR: NOT DETECTED
Giardia lamblia, PCR: NOT DETECTED
Norovirus, PCR: NOT DETECTED
ROTAVIRUS, PCR: NOT DETECTED
Salmonella, PCR: NOT DETECTED
Shigella, PCR: NOT DETECTED

## 2017-09-05 LAB — CLOSTRIDIUM DIFFICILE TOXIN B, QUALITATIVE, REAL-TIME PCR: CDIFFPCR: NOT DETECTED

## 2017-09-05 NOTE — Telephone Encounter (Signed)
   Primary Cardiologist: Will Meredith Leeds, MD  Chart reviewed as part of pre-operative protocol coverage. Patient was contacted 09/05/2017 in reference to pre-operative risk assessment for pending surgery as outlined below.  Julie Clark was last seen on 04/22/17 by Dr. Curt Bears.  Since that day, Julie Clark has done well. She can complete greater than 4.0 METS.  Therefore, based on ACC/AHA guidelines, the patient would be at acceptable risk for the planned procedure without further cardiovascular testing.   I will route this request to pharmacy for guidance on restarting eliquis after the procedure (see surgeon's note).   Once recommendations are given, we will route this recommendation to the requesting party via Epic fax function and remove from pre-op pool.  Please call with questions.  Tami Lin Duke, PA 09/05/2017, 4:19 PM

## 2017-09-06 ENCOUNTER — Other Ambulatory Visit: Payer: 59

## 2017-09-06 NOTE — Telephone Encounter (Signed)
Patient with diagnosis of ATRIAL FIBRILLATION on APIXABAN for anticoagulation.    Procedure: HEMORRHOIDECTOMY Date of procedure: TBD  CHADS2-VASc score of  2 (HTN, female)  CrCl > 50ML/MIN   Per office protocol, patient can hold APIXABAN for 2 days prior to procedure.      Izadora Roehr Rodriguez-Guzman PharmD, BCPS, [place] [id number] 09/06/2017 8:02 AM

## 2017-09-09 ENCOUNTER — Other Ambulatory Visit (HOSPITAL_COMMUNITY): Payer: Self-pay | Admitting: Nurse Practitioner

## 2017-09-09 MED FILL — ELIQUIS 5 MG TABLET: 5 | 30 days supply | Qty: 60 | Fill #0

## 2017-09-09 MED FILL — DILTIAZEM 24HR CD 120 MG CA: 120 | 30 days supply | Qty: 30 | Fill #4

## 2017-09-09 NOTE — Telephone Encounter (Signed)
Pt is a 62 yr old female who last saw Dr Curt Bears on 04/22/17 weight was 71.7Kg. Last noted SCr on 02/12/17 was 0.64. Will refill Eliquis 71m BID.

## 2017-09-10 ENCOUNTER — Ambulatory Visit
Admission: RE | Admit: 2017-09-10 | Discharge: 2017-09-10 | Disposition: A | Discharge status: Home / Self Care | Payer: 59 | Source: Ambulatory Visit | Attending: Internal Medicine | Admitting: Internal Medicine

## 2017-09-10 ENCOUNTER — Ambulatory Visit: Payer: 59

## 2017-09-10 ENCOUNTER — Other Ambulatory Visit: Payer: Self-pay | Admitting: Surgery

## 2017-09-10 ENCOUNTER — Ambulatory Visit (HOSPITAL_COMMUNITY): Payer: Self-pay | Admitting: Surgery

## 2017-09-10 DIAGNOSIS — R922 Inconclusive mammogram: Secondary | ICD-10-CM | POA: Diagnosis not present

## 2017-09-10 DIAGNOSIS — N6310 Unspecified lump in the right breast, unspecified quadrant: Secondary | ICD-10-CM

## 2017-09-10 LAB — HM MAMMOGRAPHY

## 2017-09-10 MED ORDER — BUPIVACAINE LIPOSOME 1.3 % IJ SUSP
20.0000 mL | INTRAMUSCULAR | Status: AC
  Filled 2017-09-10: qty 20

## 2017-09-10 MED ORDER — ACETAMINOPHEN 500 MG PO TABS: 1000.0000 mg | ORAL_TABLET | ORAL | Status: AC

## 2017-09-10 MED ORDER — CLINDAMYCIN PHOSPHATE 900 MG/50ML IV SOLN: 900.0000 mg | INTRAVENOUS | Status: AC

## 2017-09-10 MED ORDER — GABAPENTIN 300 MG PO CAPS
300.0000 mg | ORAL_CAPSULE | ORAL | Status: AC
Start: 2017-09-10 — End: 2017-09-11

## 2017-09-10 MED ORDER — CHLORHEXIDINE GLUCONATE CLOTH 2 % EX PADS: 6.0000 | MEDICATED_PAD | Freq: Once | CUTANEOUS | Status: DC

## 2017-09-10 MED ORDER — CELECOXIB 200 MG PO CAPS: 200.0000 mg | ORAL_CAPSULE | ORAL | Status: AC

## 2017-09-10 MED ORDER — DEXTROSE 5 % IV SOLN
300.0000 mg | INTRAVENOUS | Status: AC
  Filled 2017-09-10: qty 7.5

## 2017-09-10 NOTE — H&P (Signed)
Julie Clark Documented: 09/03/2017 8:54 AM Location: [facility] Patient #: [id number] DOB: 02/18/1956 Married / Language: English / Race: Black or African American Female   History of Present Illness [name] MD; 09/03/2017 1:03 PM) The patient is a 62 year old female who presents with hemorrhoids. Note for "Hemorrhoids": ` ` ` Patient sent for surgical consultation at the request of Dr. Oretha Caprice, [facility]  Chief Complaint: Worsening hemorrhoids.  The patient is a pleasant woman. Works as a Marine scientist at [employer]. She's had hemorrhoids for many years. She has noted some tags in the outside. There been discussion of banding versus surgical referral. More recently felt to need surgical referral given the external components. She seemed to hold off. Unfortunately, more recently become more swollen and uncomfortable. She was placed on anticoagulation Eliquis. Noticed more regular rectal bleeding. She also noticed some lumps / cysts perianally that occasionally will pop/ rupture. Usually would move her bowels a couple times a week. Now they are more mushy and soft every day. Not on a fiber supplement. She's never had any prior anorectal interventions. No injection or banding. No thrombosed hemorrhoid lanced. No rectal abscesses needing to be lanced. She had C-sections but no other abdominal surgery. She is followed by Dr. Curt Bears with cardiology.  No personal nor family history of GI/colon cancer, inflammatory bowel disease, irritable bowel syndrome, allergy such as Celiac Sprue, dietary/dairy problems, colitis, ulcers nor gastritis. No recent sick contacts/gastroenteritis. No travel outside the country. No changes in diet. No dysphagia to solids or liquids. No significant heartburn or reflux. No hematochezia, hematemesis, coffee ground emesis. No evidence of prior gastric/peptic ulceration. She recalls having abscesses on her tailbone lanced.  The last one almost a decade ago. She thinks it was in our office but cannot remember for certain.  (Review of systems as stated in this history (HPI) or in the review of systems. Otherwise all other 12 point ROS are negative) ` ` `   Past Surgical History ([name], [place]; 09/03/2017 8:55 AM) Cesarean Section - Multiple  Oral Surgery   Diagnostic Studies History ([name], [place]; 09/03/2017 8:55 AM) Colonoscopy  1-5 years ago Mammogram  within last year Pap Smear  1-5 years ago  Allergies ([name], [place]; 09/03/2017 8:56 AM) Penicillins  Allergies Reconciled   Medication History ([name], [place]; 09/03/2017 8:56 AM) Geronimo Boot XT (120MG Capsule ER 24HR, Oral) Active. Eliquis (5MG Tablet, Oral) Active. Metoprolol Succinate ER (25MG Tablet ER 24HR, Oral) Active. Ibuprofen (800MG Tablet, Oral) Active. Medications Reconciled  Social History ([name], [place]; 09/03/2017 8:55 AM) Caffeine use  Coffee. No alcohol use  No drug use  Tobacco use  Never smoker.  Family History ([name], [place]; 09/03/2017 8:55 AM) Cerebrovascular Accident  Mother. Hypertension  Father, Mother. Prostate Cancer  Father.  Pregnancy / Birth History ([name], [place]; 09/03/2017 8:55 AM) Age at menarche  56 years. Age of menopause  51-55 Contraceptive History  Oral contraceptives. Gravida  2 Maternal age  85-30 Para  2  Other Problems ([name], [place]; 09/03/2017 8:55 AM) Atrial Fibrillation  Heart murmur  Hemorrhoids  High blood pressure     Review of Systems ([name]; 09/03/2017 8:55 AM) General Not Present- Appetite Loss, Chills, Fatigue, Fever, Night Sweats, Weight Gain and Weight Loss. Skin Not Present- Change in Wart/Mole, Dryness, Hives, Jaundice, New Lesions, Non-Healing Wounds, Rash and Ulcer. HEENT Present- Wears glasses/contact lenses. Not  Present- Earache, Hearing Loss, Hoarseness, Nose Bleed, Oral Ulcers,  Ringing in the Ears, Seasonal Allergies, Sinus Pain, Sore Throat, Visual Disturbances and Yellow Eyes. Respiratory Not Present- Bloody sputum, Chronic Cough, Difficulty Breathing, Snoring and Wheezing. Breast Not Present- Breast Mass, Breast Pain, Nipple Discharge and Skin Changes. Gastrointestinal Present- Bloody Stool, Change in Bowel Habits and Hemorrhoids. Not Present- Abdominal Pain, Bloating, Chronic diarrhea, Constipation, Difficulty Swallowing, Excessive gas, Gets full quickly at meals, Indigestion, Nausea, Rectal Pain and Vomiting. Female Genitourinary Not Present- Frequency, Nocturia, Painful Urination, Pelvic Pain and Urgency. Musculoskeletal Not Present- Back Pain, Joint Pain, Joint Stiffness, Muscle Pain, Muscle Weakness and Swelling of Extremities. Neurological Not Present- Decreased Memory, Fainting, Headaches, Numbness, Seizures, Tingling, Tremor, Trouble walking and Weakness. Psychiatric Not Present- Anxiety, Bipolar, Change in Sleep Pattern, Depression, Fearful and Frequent crying. Endocrine Not Present- Cold Intolerance, Excessive Hunger, Hair Changes, Heat Intolerance, Hot flashes and New Diabetes. Hematology Present- Blood Thinners. Not Present- Easy Bruising, Excessive bleeding, Gland problems, HIV and Persistent Infections.  Vitals ([name]; 09/03/2017 8:56 AM) 09/03/2017 8:55 AM Weight: 152.6 lb Height: 64in Body Surface Area: 1.74 m Body Mass Index: 26.19 kg/m  Temp.: 97.26F  Pulse: 98 (Regular)  BP: 136/72 (Sitting, Left Arm, Standard)       Physical Exam [name] MD; 09/03/2017 9:32 AM) General Mental Status-Alert. General Appearance-Not in acute distress, Not Sickly. Orientation-Oriented X3. Hydration-Well hydrated. Voice-Normal.  Integumentary Global Assessment Upon inspection and palpation of skin surfaces of the - Axillae: non-tender, no inflammation or ulceration, no drainage. and Distribution of scalp and body  hair is normal. General Characteristics Temperature - normal warmth is noted.  Head and Neck Head-normocephalic, atraumatic with no lesions or palpable masses. Face Global Assessment - atraumatic, no absence of expression. Neck Global Assessment - no abnormal movements, no bruit auscultated on the right, no bruit auscultated on the left, no decreased range of motion, non-tender. Trachea-midline. Thyroid Gland Characteristics - non-tender.  Eye Eyeball - Left-Extraocular movements intact, No Nystagmus. Eyeball - Right-Extraocular movements intact, No Nystagmus. Cornea - Left-No Hazy. Cornea - Right-No Hazy. Sclera/Conjunctiva - Left-No scleral icterus, No Discharge. Sclera/Conjunctiva - Right-No scleral icterus, No Discharge. Pupil - Left-Direct reaction to light normal. Pupil - Right-Direct reaction to light normal.  ENMT Ears Pinna - Left - no drainage observed, no generalized tenderness observed. Right - no drainage observed, no generalized tenderness observed. Nose and Sinuses External Inspection of the Nose - no destructive lesion observed. Inspection of the nares - Left - quiet respiration. Right - quiet respiration. Mouth and Throat Lips - Upper Lip - no fissures observed, no pallor noted. Lower Lip - no fissures observed, no pallor noted. Nasopharynx - no discharge present. Oral Cavity/Oropharynx - Tongue - no dryness observed. Oral Mucosa - no cyanosis observed. Hypopharynx - no evidence of airway distress observed.  Chest and Lung Exam Inspection Movements - Normal and Symmetrical. Accessory muscles - No use of accessory muscles in breathing. Palpation Palpation of the chest reveals - Non-tender. Auscultation Breath sounds - Normal and Clear.  Cardiovascular Auscultation Rhythm - Regular. Murmurs & Other Heart Sounds - Auscultation of the heart reveals - No Murmurs and No Systolic Clicks.  Abdomen Inspection Inspection of the abdomen reveals  - No Visible peristalsis and No Abnormal pulsations. Umbilicus - No Bleeding, No Urine drainage. Palpation/Percussion Palpation and Percussion of the abdomen reveal - Soft, Non Tender, No Rebound tenderness, No Rigidity (guarding) and No Cutaneous hyperesthesia. Note: Abdomen soft. Not severely distended. No distasis  recti. No umbilical or other anterior abdominal wall hernias   Female Genitourinary Sexual Maturity Tanner 5 - Adult hair pattern. Note: No vaginal bleeding nor discharge. No inguinal lymphadenopathy. Suprapubic Pfannenstiel incision well-healed. No sores or lesions or hydradenitis in the groins or perineum   Rectal Note: 3 pile prolapsed hemorrhoids with inflammation. Grade 4. No thrombus or necrosis. Very sensitive. About 4 red dots consistent with some superficial sinuses/peritonitis/fistula. No definite purulence expressed. Within 3 cm of the anus. Some pruritus. Sphincter tone intact mildly increased.  He tolerates finger examination. No obvious rectal masses are fissure. No abscess. No condyloma. Off on anoscopy given the sensitivity. Vertical scarring left intergluteal cleft. Suspicious for prior incision and drainage of pilonidal abscess. No chronic pits or sinus. No evidence of active pilonidal disease at this time.   Peripheral Vascular Upper Extremity Inspection - Left - No Cyanotic nailbeds, Not Ischemic. Right - No Cyanotic nailbeds, Not Ischemic.  Neurologic Neurologic evaluation reveals -normal attention span and ability to concentrate, able to name objects and repeat phrases. Appropriate fund of knowledge , normal sensation and normal coordination. Mental Status Affect - not angry, not paranoid. Cranial Nerves-Normal Bilaterally. Gait-Normal.  Neuropsychiatric Mental status exam performed with findings of-able to articulate well with normal speech/language, rate, volume and coherence, thought content normal with ability to perform basic  computations and apply abstract reasoning and no evidence of hallucinations, delusions, obsessions or homicidal/suicidal ideation.  Musculoskeletal Global Assessment Spine, Ribs and Pelvis - no instability, subluxation or laxity. Right Upper Extremity - no instability, subluxation or laxity.  Lymphatic Head & Neck  General Head & Neck Lymphatics: Bilateral - Description - No Localized lymphadenopathy. Axillary  General Axillary Region: Bilateral - Description - No Localized lymphadenopathy. Femoral & Inguinal  Generalized Femoral & Inguinal Lymphatics: Left - Description - No Localized lymphadenopathy. Right - Description - No Localized lymphadenopathy.    Assessment & Plan [name] MD; 09/03/2017 1:05 PM) PROLAPSED INTERNAL HEMORRHOIDS, GRADE 4 (K64.3) Impression: 3 pile chronically prolapsed hemorrhoids with inflammation and irritation.  I recommend warm soaks and fiber bowel regimen to minimize irritation. Try to calm the inflammation down.  These are too far along to do banding. She is too sensitive and barely can tolerate digital rectal exam. Colonoscopy 5 years ago argues against any Crohns, IBD, or other major lesions.  At some point she would benefit from surgical ligation/pexy hemorrhoidectomy. Would like this flare to calm down a little bit. She seems rather miserable & is hoping I could do something as soon as possible.  Get cardiac clearance to make sure she can come off Eliquis.  Obtain MRI given the other perianal wounds to rule out fistula or other complicating issues, etc. if she has numerous fistulae, may need to regroup and do Crohn's workup through gastroenterology first. Otherwise may need unroofing our punch biopsy possible perianal hidradenitis. Perhaps it will dry up once the chronically prolapsed hemorrhoids are cleared up.  The anatomy & physiology of the anorectal region was discussed. The pathophysiology of hemorrhoids and differential diagnosis was  discussed. Natural history progression was discussed. I stressed the importance of a bowel regimen to have daily soft bowel movements to minimize progression of disease. Goal of one BM / day ideal. Use of wet wipes, warm baths, avoiding straining, etc were emphasized.  Educational handouts further explaining the pathology, treatment options, and bowel regimen were given as well. The patient expressed understanding. Current Plans Pt Education - CCS Hemorrhoids (Shuntel Fishburn): discussed with patient and provided  information. Pt Education - Pamphlet Given - The Hemorrhoid Book: discussed with patient and provided information. You are being scheduled for surgery- Our schedulers will call you.  You should hear from our office's scheduling department within 5 working days about the location, date, and time of surgery. We try to make accommodations for patient's preferences in scheduling surgery, but sometimes the OR schedule or the surgeon's schedule prevents [place] from making those accommodations.  If you have not heard from our office [phone]) in 5 working days, call the office and ask for your surgeon's nurse.  If you have other questions about your diagnosis, plan, or surgery, call the office and ask for your surgeon's nurse.  The anatomy & physiology of the anorectal region was discussed. The pathophysiology of hemorrhoids and differential diagnosis was discussed. Natural history risks without surgery was discussed. I stressed the importance of a bowel regimen to have daily soft bowel movements to minimize progression of disease. Interventions such as sclerotherapy & banding were discussed.  The patient's symptoms are not adequately controlled by medicines and other non-operative treatments. I feel the risks & problems of no surgery outweigh the operative risks; therefore, I recommended surgery to treat the hemorrhoids by ligation, pexy, and possible resection.  Risks such as bleeding,  infection, urinary difficulties, need for further treatment, heart attack, death, and other risks were discussed. I noted a good likelihood this will help address the problem. Goals of post-operative recovery were discussed as well. Possibility that this will not correct all symptoms was explained. Post-operative pain, bleeding, constipation, and other problems after surgery were discussed. We will work to minimize complications. Educational handouts further explaining the pathology, treatment options, and bowel regimen were given as well. Questions were answered. The patient expresses understanding & wishes to proceed with surgery.  Pt Education - CCS Rectal Prep for Anorectal outpatient/office surgery: discussed with patient and provided information. Started Proctozone-HC 2.5 % Rectal Cream, 1 (one) Application three times daily, as needed, 2 Applicator, 75/02/2584, Ref. x10. PERIANAL CYST (K62.89) Impression: Numerous pockets of perianal cysts. No history of Crohn's or inflammatory bowel disease but looks a little suspicious for that. Perhaps is just hidradenitis suppurativa of the perianal region. She could have significant fistulous disease.  Obtain MRI of pelvis to rule out any fistulous disease or other concerns. It is superficial, hopefully will improve with treatment of the hemorrhoids at the chronic moisture. I might do some punch biopsies in OR to help unroof the region out of the truly all are superficial. If still having persistent sores that region with the hemorrhoids more normal, may consider topical and oral antibiotics for 6 weeks. PILONIDAL CYST WITHOUT INFECTION (L05.91) Impression: Prior scarring and upper intergluteal cleft consistent with prior incision and drainage for pilonidal disease. No definite evidence of recurrence at this time.  We'll try and get records from prior incision and drainage done about a decade ago. Current Plans The anatomy of the gluteal and sacral  region was discussed. Pathophysiology of pilonidal disease due to ingrown hairs was discussed. Importance of good hygiene discussed. Importance of keeping hairs trimmed in the intergluteal crease from anus to top of sacrum/tailbone spine noted. Need for good hygiene stressed. Use of electric razor or nose hair trimmers to keep the hairs trimmed discussed.  Risk of worsening progression needing surgical drainage of abscess or perhaps excision of chronic pilonidal disease with skin flap closure over drains discussed. Possible need for her to leave it open with packing to allow the wound  close over several weeks/months discussed. Risks benefits alternatives to surgery discussed as well. Risk of recurrent disease discussed. Patient was expressed understanding. Literature given to patient. We will see if we can control this without an operation first.  Pt Education - CCS Pilonidal Disease (AT)   Signed by [name], MD (09/03/2017 1:06 PM)  [name], M.D., F.A.C.S. Gastrointestinal and Minimally Invasive Surgery [facility] [address], [place], [place] [address] [phone] Main / Paging

## 2017-09-16 ENCOUNTER — Ambulatory Visit
Admission: RE | Admit: 2017-09-16 | Discharge: 2017-09-16 | Disposition: A | Discharge status: Home / Self Care | Payer: 59 | Source: Ambulatory Visit | Attending: Surgery | Admitting: Surgery

## 2017-09-16 DIAGNOSIS — K610 Anal abscess: Secondary | ICD-10-CM | POA: Diagnosis not present

## 2017-09-16 DIAGNOSIS — K6289 Other specified diseases of anus and rectum: Secondary | ICD-10-CM

## 2017-09-16 MED ORDER — GADOBENATE DIMEGLUMINE 529 MG/ML IV SOLN
14.0000 mL | Freq: Once | INTRAVENOUS | Status: AC | PRN
  Administered 2017-09-16: 14 mL via INTRAVENOUS

## 2017-09-24 ENCOUNTER — Ambulatory Visit (AMBULATORY_SURGERY_CENTER): Payer: Self-pay | Admitting: *Deleted

## 2017-09-24 ENCOUNTER — Other Ambulatory Visit: Payer: Self-pay

## 2017-09-24 ENCOUNTER — Telehealth: Payer: Self-pay | Admitting: Pharmacist

## 2017-09-24 VITALS — Ht 64.0 in | Wt 150.0 lb

## 2017-09-24 DIAGNOSIS — R9389 Abnormal findings on diagnostic imaging of other specified body structures: Secondary | ICD-10-CM

## 2017-09-24 NOTE — Telephone Encounter (Signed)
The pt has been advised of the req's for Eliquis hold.

## 2017-09-24 NOTE — Telephone Encounter (Signed)
-----   Message from Will Meredith Leeds, MD sent at 09/24/2017 10:51 AM EDT ----- I think that this was meant for pharmacy. If you need something from me, let me know. ----- Message ----- From: Jeoffrey Massed, RN Sent: 09/24/2017   9:15 AM To: Constance Haw, MD

## 2017-09-24 NOTE — Progress Notes (Signed)
No egg or soy allergy  No trouble moving neck  No diet medications taken  Suprep sample given  No home oxygen used or hx of sleep apnea

## 2017-09-24 NOTE — Telephone Encounter (Addendum)
Pt takes Eliquis for afib with CHADS2VASc score of 2 (sex, HTN). Renal function is normal. Ok to hold Eliquis for 1-2 days prior to procedure.

## 2017-09-24 NOTE — Telephone Encounter (Signed)
Letter   Jeoffrey Massed, RN on 09/24/2017      [facility] [place], [place]  [address] Phone:  [phone]   Fax:  [phone]     Request for surgical clearance:     Endoscopy Procedure  What type of surgery is being performed?     Colonoscopy  When is this surgery scheduled?     09/27/17  What type of clearance is required ?   Pharmacy  Are there any medications that need to be held prior to surgery and how long? Eliquis  Practice name and name of physician performing surgery?      [facility]  What is your office phone and fax number?      Phone- [phone]  [phone]  Anesthesia type (None, local, MAC, general) ?       MAC  Please respond ASAP

## 2017-09-26 ENCOUNTER — Telehealth: Payer: Self-pay

## 2017-09-26 LAB — HM COLONOSCOPY

## 2017-09-26 NOTE — Telephone Encounter (Signed)
-----   Message from Jeoffrey Massed, RN sent at 09/24/2017  9:15 AM EDT ----- Waiting on anticoag response.  Procedure on 09/27/17

## 2017-09-26 NOTE — Telephone Encounter (Signed)
The pt has been advised to hold Eliquis 1 day prior to procedure

## 2017-09-27 ENCOUNTER — Ambulatory Visit (AMBULATORY_SURGERY_CENTER): Payer: 59 | Admitting: Gastroenterology

## 2017-09-27 ENCOUNTER — Other Ambulatory Visit: Payer: Self-pay

## 2017-09-27 ENCOUNTER — Other Ambulatory Visit: Payer: 59

## 2017-09-27 ENCOUNTER — Encounter: Payer: Self-pay | Admitting: Gastroenterology

## 2017-09-27 VITALS — BP 140/76 | HR 75 | Temp 98.6°F | Resp 15 | Ht 64.0 in | Wt 150.0 lb

## 2017-09-27 DIAGNOSIS — K219 Gastro-esophageal reflux disease without esophagitis: Secondary | ICD-10-CM | POA: Diagnosis not present

## 2017-09-27 DIAGNOSIS — K529 Noninfective gastroenteritis and colitis, unspecified: Secondary | ICD-10-CM | POA: Diagnosis not present

## 2017-09-27 DIAGNOSIS — R933 Abnormal findings on diagnostic imaging of other parts of digestive tract: Secondary | ICD-10-CM | POA: Diagnosis not present

## 2017-09-27 DIAGNOSIS — K603 Anal fistula: Secondary | ICD-10-CM

## 2017-09-27 DIAGNOSIS — K5150 Left sided colitis without complications: Secondary | ICD-10-CM | POA: Diagnosis not present

## 2017-09-27 DIAGNOSIS — R197 Diarrhea, unspecified: Secondary | ICD-10-CM | POA: Diagnosis present

## 2017-09-27 DIAGNOSIS — I10 Essential (primary) hypertension: Secondary | ICD-10-CM | POA: Diagnosis not present

## 2017-09-27 DIAGNOSIS — R9389 Abnormal findings on diagnostic imaging of other specified body structures: Secondary | ICD-10-CM

## 2017-09-27 DIAGNOSIS — I4891 Unspecified atrial fibrillation: Secondary | ICD-10-CM | POA: Diagnosis not present

## 2017-09-27 DIAGNOSIS — K626 Ulcer of anus and rectum: Secondary | ICD-10-CM | POA: Diagnosis not present

## 2017-09-27 DIAGNOSIS — K5190 Ulcerative colitis, unspecified, without complications: Secondary | ICD-10-CM | POA: Diagnosis not present

## 2017-09-27 MED ORDER — PREDNISONE 10 MG PO TABS: 40.0000 mg | ORAL_TABLET | Freq: Every day | ORAL | 1 refills | Status: DC

## 2017-09-27 MED ORDER — SODIUM CHLORIDE 0.9 % IV SOLN: 500.0000 mL | Freq: Once | INTRAVENOUS | Status: DC

## 2017-09-27 MED FILL — predniSONE 10 MG TABS: 10 | 25 days supply | Qty: 100 | Fill #0

## 2017-09-27 NOTE — Patient Instructions (Signed)
YOU HAD AN ENDOSCOPIC PROCEDURE TODAY AT [facility]:   Refer to the procedure report that was given to you for any specific questions about what was found during the examination.  If the procedure report does not answer your questions, please call your gastroenterologist to clarify.  If you requested that your care partner not be given the details of your procedure findings, then the procedure report has been included in a sealed envelope for you to review at your convenience later.  YOU SHOULD EXPECT: Some feelings of bloating in the abdomen. Passage of more gas than usual.  Walking can help get rid of the air that was put into your GI tract during the procedure and reduce the bloating. If you had a lower endoscopy (such as a colonoscopy or flexible sigmoidoscopy) you may notice spotting of blood in your stool or on the toilet paper. If you underwent a bowel prep for your procedure, you may not have a normal bowel movement for a few days.  Please Note:  You might notice some irritation and congestion in your nose or some drainage.  This is from the oxygen used during your procedure.  There is no need for concern and it should clear up in a day or so.  SYMPTOMS TO REPORT IMMEDIATELY:   Following lower endoscopy (colonoscopy or flexible sigmoidoscopy):  Excessive amounts of blood in the stool  Significant tenderness or worsening of abdominal pains  Swelling of the abdomen that is new, acute  Fever of 100F or higher   For urgent or emergent issues, a gastroenterologist can be reached at any hour by calling [phone].   DIET:  We do recommend a small meal at first, but then you may proceed to your regular diet.  Drink plenty of fluids but you should avoid alcoholic beverages for 24 hours.  ACTIVITY:  You should plan to take it easy for the rest of today and you should NOT DRIVE or use heavy machinery until tomorrow (because of the sedation medicines used during the test).     FOLLOW UP: Our staff will call the number listed on your records the next business day following your procedure to check on you and address any questions or concerns that you may have regarding the information given to you following your procedure. If we do not reach you, we will leave a message.  However, if you are feeling well and you are not experiencing any problems, there is no need to return our call.  We will assume that you have returned to your regular daily activities without incident.  If any biopsies were taken you will be contacted by phone or by letter within the next 1-3 weeks.  Please call us at [phone] if you have not heard about the biopsies in 3 weeks.    SIGNATURES/CONFIDENTIALITY: You and/or your care partner have signed paperwork which will be entered into your electronic medical record.  These signatures attest to the fact that that the information above on your After Visit Summary has been reviewed and is understood.  Full responsibility of the confidentiality of this discharge information lies with you and/or your care-partner.

## 2017-09-27 NOTE — Progress Notes (Signed)
Called to room to assist during endoscopic procedure.  Patient ID and intended procedure confirmed with present staff. Received instructions for my participation in the procedure from the performing physician.  

## 2017-09-27 NOTE — Progress Notes (Signed)
Report to PACU, RN, vss, BBS= Clear.  

## 2017-09-27 NOTE — Op Note (Addendum)
[place] Patient Name: Julie Clark Procedure Date: 09/27/2017 7:20 AM MRN: [id number] Endoscopist: Milus Banister , MD Age: 62 Referring MD:  Date of Birth: 11/10/1955 Gender: Female Account #: [id number] Procedure:                Colonoscopy Indications:              Diarrhea, perianal fistulas, large hemorrhoids, h/o                            chronic inflammation on 2014 colonoscopy                            (attributed to NSAID use at the time) Medicines:                Monitored Anesthesia Care Procedure:                Pre-Anesthesia Assessment:                           - Prior to the procedure, a History and Physical                            was performed, and patient medications and                            allergies were reviewed. The patient's tolerance of                            previous anesthesia was also reviewed. The risks                            and benefits of the procedure and the sedation                            options and risks were discussed with the patient.                            All questions were answered, and informed consent                            was obtained. Prior Anticoagulants: The patient has                            taken Eliquis (apixaban), last dose was 2 days                            prior to procedure. ASA Grade Assessment: II - A                            patient with mild systemic disease. After reviewing                            the risks and benefits, the patient was deemed in  satisfactory condition to undergo the procedure.                           After obtaining informed consent, the colonoscope                            was passed under direct vision. Throughout the                            procedure, the patient's blood pressure, pulse, and                            oxygen saturations were monitored continuously. The                            Model PCF-H190DL  [id number]) scope was introduced                            through the anus and advanced to the the terminal                            ileum. The colonoscopy was performed without                            difficulty. The patient tolerated the procedure                            well. The quality of the bowel preparation was                            good. The terminal ileum, ileocecal valve,                            appendiceal orifice, and rectum were photographed. Scope In: 7:27:00 AM Scope Out: 7:50:07 AM Scope Withdrawal Time: 0 hours 17 minutes 7 seconds  Total Procedure Duration: 0 hours 23 minutes 7 seconds  Findings:                 Large external hemorrhoids with sinus tracts                            evident.                           The terminal ileum appeared normal. Biopsies were                            taken with a cold forceps for histology. Jar 1.                           There was moderate to severe inflammation                            throughout the colon: the mucosa throughout the  colon was fairly granular, mildly inflammed. There                            were several areas of deep ulceration, other areas                            of multiple erosions. The distal rectum was very                            inflammed, ulcerated. The colon was biopsied                            extensively (right colon Jar 2. left colon Jar 3.                            rectum Jar 4).                           The exam was otherwise without abnormality on                            direct and retroflexion views. Complications:            No immediate complications. Estimated blood loss:                            None. Estimated Blood Loss:     Estimated blood loss: none. Impression:               - Normal terminal ileum. Moderate to severe                            segmental colitis with severe perianal disease                             evident.                           - This is consistent with Crohn's colitis. Recommendation:           - Patient has a contact number available for                            emergencies. The signs and symptoms of potential                            delayed complications were discussed with the                            patient. Return to normal activities tomorrow.                            Written discharge instructions were provided to the                            patient.                           -  Resume previous diet.                           - Continue present medications. Resume your eliquis                            tomorrow. Start prednisone 95m once daily today                            (disp 139mpills, 100, refills 1) for now. Likely                            to start biologic +/- azathiaprine after pathology                            is back.                           - Await pathology results.                           - Labs today (TB test quant GOLD, Hepatitis B                            surface Ab, Hepatitis B surface Ag, Hepatitis C Ab,                            TPMT enzyme activity level).                           - Dr. JaArdis Hughsffice to arrange CT enterography to                            check for small bowel disease. DaMilus BanisterMD 09/27/2017 8:00:10 AM This report has been signed electronically.

## 2017-09-30 ENCOUNTER — Telehealth: Payer: Self-pay

## 2017-09-30 DIAGNOSIS — R1084 Generalized abdominal pain: Secondary | ICD-10-CM

## 2017-09-30 NOTE — Telephone Encounter (Signed)
You have been scheduled for a CT scan of the abdomen and pelvis at [facility] ([address]---this is in the same building as Press [facility]).   You are scheduled on  at . You should arrive 15 minutes prior to your appointment time for registration. Please follow the written instructions below on the day of your exam:  WARNING: IF YOU ARE ALLERGIC TO IODINE/X-RAY DYE, PLEASE NOTIFY RADIOLOGY IMMEDIATELY AT [phone]! YOU WILL BE GIVEN A 13 HOUR PREMEDICATION PREP.  1) Do not eat or drink anything after  (4 hours prior to your test) 2) You have been given 2 bottles of oral contrast to drink. The solution may taste better if refrigerated, but do NOT add ice or any other liquid to this solution. Shake well before drinking.   Drink 1 bottle of contrast @  (2 hours prior to your exam) Drink 1 bottle of contrast @  (1 hour prior to your exam)  You may take any medications as prescribed with a small amount of water except for the following: Metformin, Glucophage, Glucovance, Avandamet, Riomet, Fortamet, Actoplus Met, Janumet, Glumetza or Metaglip. The above medications must be held the day of the exam AND 48 hours after the exam.  The purpose of you drinking the oral contrast is to aid in the visualization of your intestinal tract. The contrast solution may cause some diarrhea. Before your exam is started, you will be given a small amount of fluid to drink. Depending on your individual set of symptoms, you may also receive an intravenous injection of x-ray contrast/dye. Plan on being at [facility] for 30 minutes or longer, depending on the type of exam you are having performed.  This test typically takes 30-45 minutes to complete.  If you have any questions regarding your exam or if you need to reschedule, you may call the CT department at [phone] between the hours of 8:00 am and 5:00 pm,  Monday-Friday.  ________________________________________________________________________

## 2017-09-30 NOTE — Telephone Encounter (Signed)
  Follow up Call-  Call Ciji Boston number 09/27/2017  Post procedure Call Lakeasha Petion phone  # [phone]  Permission to leave phone message Yes  Some recent data might be hidden     Patient questions:  Do you have a fever, pain , or abdominal swelling? No. Pain Score  0 *  Have you tolerated food without any problems? Yes.    Have you been able to return to your normal activities? Yes.    Do you have any questions about your discharge instructions: Diet   No. Medications  No. Follow up visit  No.  Do you have questions or concerns about your Care? No.  Actions: * If pain score is 4 or above: No action needed, pain <4.

## 2017-09-30 NOTE — Telephone Encounter (Signed)
10/09/17 230 pm arrive at 115 pm the pt will not need to drink any contrast prior to the procedure.  She has been advised and all questions answered.

## 2017-10-02 ENCOUNTER — Other Ambulatory Visit (INDEPENDENT_AMBULATORY_CARE_PROVIDER_SITE_OTHER): Payer: 59

## 2017-10-02 DIAGNOSIS — K529 Noninfective gastroenteritis and colitis, unspecified: Secondary | ICD-10-CM

## 2017-10-02 LAB — COMPREHENSIVE METABOLIC PANEL
ALBUMIN: 3.8 g/dL (ref 3.5–5.2)
ALT: 8 U/L (ref 0–35)
AST: 10 U/L (ref 0–37)
Alkaline Phosphatase: 75 U/L (ref 39–117)
BILIRUBIN TOTAL: 0.3 mg/dL (ref 0.2–1.2)
BUN: 18 mg/dL (ref 6–23)
CO2: 28 mEq/L (ref 19–32)
Calcium: 9 mg/dL (ref 8.4–10.5)
Chloride: 99 mEq/L (ref 96–112)
Creatinine, Ser: 0.76 mg/dL (ref 0.40–1.20)
GFR: 99.32 mL/min (ref >60.00)
GLUCOSE: 146 mg/dL — AB (ref 70–99)
Potassium: 3.5 mEq/L (ref 3.5–5.1)
SODIUM: 137 meq/L (ref 135–145)
Total Protein: 7.8 g/dL (ref 6.0–8.3)

## 2017-10-02 LAB — CBC WITH DIFFERENTIAL/PLATELET
BASOS ABS: 0 10*3/uL (ref 0.0–0.1)
Basophils Relative: 0.1 % (ref 0.0–3.0)
EOS PCT: 0 % (ref 0.0–5.0)
Eosinophils Absolute: 0 10*3/uL (ref 0.0–0.7)
HCT: 30.4 % — ABNORMAL LOW (ref 36.0–46.0)
HEMOGLOBIN: 9.7 g/dL — AB (ref 12.0–15.0)
LYMPHS ABS: 0.8 10*3/uL (ref 0.7–4.0)
Lymphocytes Relative: 4.3 % — ABNORMAL LOW (ref 12.0–46.0)
MCHC: 31.8 g/dL (ref 30.0–36.0)
MCV: 77.7 fl — AB (ref 78.0–100.0)
MONOS PCT: 0.9 % — AB (ref 3.0–12.0)
Monocytes Absolute: 0.2 10*3/uL (ref 0.1–1.0)
NEUTROS PCT: 94.7 % — AB (ref 43.0–77.0)
Neutro Abs: 17.8 10*3/uL — ABNORMAL HIGH (ref 1.4–7.7)
Platelets: 669 10*3/uL — ABNORMAL HIGH (ref 150.0–400.0)
RBC: 3.91 Mil/uL (ref 3.87–5.11)
RDW: 14.5 % (ref 11.5–15.5)
WBC: 18.8 10*3/uL — AB (ref 4.0–10.5)

## 2017-10-07 ENCOUNTER — Other Ambulatory Visit: Payer: Self-pay

## 2017-10-07 ENCOUNTER — Other Ambulatory Visit: Payer: 59

## 2017-10-07 DIAGNOSIS — K529 Noninfective gastroenteritis and colitis, unspecified: Secondary | ICD-10-CM

## 2017-10-08 ENCOUNTER — Encounter: Payer: Self-pay | Admitting: Internal Medicine

## 2017-10-08 ENCOUNTER — Telehealth: Payer: Self-pay | Admitting: Internal Medicine

## 2017-10-08 DIAGNOSIS — K50114 Crohn's disease of large intestine with abscess: Secondary | ICD-10-CM

## 2017-10-08 MED ORDER — CIPROFLOXACIN HCL 500 MG PO TABS: 500.0000 mg | ORAL_TABLET | Freq: Two times a day (BID) | ORAL | 0 refills | Status: DC

## 2017-10-08 MED ORDER — PREDNISONE 10 MG PO TABS: 30.0000 mg | ORAL_TABLET | Freq: Every day | ORAL | 1 refills | Status: DC

## 2017-10-08 MED ORDER — METRONIDAZOLE 500 MG PO TABS
500.0000 mg | ORAL_TABLET | Freq: Two times a day (BID) | ORAL | 0 refills | Status: DC
Start: 2017-10-08 — End: 2017-11-05

## 2017-10-08 MED FILL — metroNIDAZOLE 500 MG TABS: 500 | 10 days supply | Qty: 20 | Fill #0

## 2017-10-08 MED FILL — ELIQUIS 5 MG TABLET: 5 | 30 days supply | Qty: 60 | Fill #1

## 2017-10-08 MED FILL — CIPROFLOXACIN HCL 500 MG TA: 500 | 10 days supply | Qty: 20 | Fill #0

## 2017-10-08 MED FILL — CARTIA XT 120 MG CAPSULE SA: 120 | 30 days supply | Qty: 30 | Fill #5

## 2017-10-08 NOTE — Telephone Encounter (Signed)
C/o rectal pain  Exam w/ Jackolyn Confer CMA  1) 1-1.5 cm firm sq area w/ purulent dc through 3 tiny sinuses on left buttock - left posterior area 2) firm area right anterior no drainage - 0.5-.75 cm 3) large fleshy tags  Discussed w/ Dr. Johney Maine and Ardis Hughs  1) cipro and flagyl rex 556m bid both x 10 d 2) sitz baths 3) reassess tomorrow 4) reduce prednisone to 30 mg qd 5) if not improving probable I and D 6) Seton(s) likely needed

## 2017-10-09 ENCOUNTER — Ambulatory Visit (INDEPENDENT_AMBULATORY_CARE_PROVIDER_SITE_OTHER)
Admission: RE | Admit: 2017-10-09 | Discharge: 2017-10-09 | Disposition: A | Discharge status: Home / Self Care | Payer: 59 | Source: Ambulatory Visit | Attending: Gastroenterology | Admitting: Gastroenterology

## 2017-10-09 ENCOUNTER — Telehealth: Payer: Self-pay | Admitting: Gastroenterology

## 2017-10-09 ENCOUNTER — Ambulatory Visit: Payer: Self-pay | Admitting: Surgery

## 2017-10-09 ENCOUNTER — Other Ambulatory Visit: Payer: Self-pay

## 2017-10-09 DIAGNOSIS — R1084 Generalized abdominal pain: Secondary | ICD-10-CM | POA: Diagnosis not present

## 2017-10-09 DIAGNOSIS — K5090 Crohn's disease, unspecified, without complications: Secondary | ICD-10-CM | POA: Diagnosis not present

## 2017-10-09 LAB — QUANTIFERON-TB GOLD PLUS
Mitogen-NIL: 1.94 [IU]/mL
NIL: 0.02 [IU]/mL
QuantiFERON-TB Gold Plus: NEGATIVE
TB1-NIL: 0 [IU]/mL
TB2-NIL: <0 [IU]/mL

## 2017-10-09 LAB — THIOPURINE METHYLTRANSFERASE (TPMT), RBC: Thiopurine Methyltransferase, RBC: 14 nmol/hr/mL RBC

## 2017-10-09 LAB — HEPATITIS B SURFACE ANTIBODY,QUALITATIVE: Hep B S Ab: REACTIVE — AB

## 2017-10-09 LAB — HEPATITIS B SURFACE ANTIGEN: Hepatitis B Surface Ag: NONREACTIVE

## 2017-10-09 LAB — HEPATITIS C ANTIBODY
Hepatitis C Ab: NONREACTIVE
SIGNAL TO CUT-OFF: 0.21 (ref <1.00)

## 2017-10-09 LAB — HEPATITIS A ANTIBODY, TOTAL: Hepatitis A AB,Total: NONREACTIVE

## 2017-10-09 MED ORDER — IOPAMIDOL (ISOVUE-300) INJECTION 61%
100.0000 mL | Freq: Once | INTRAVENOUS | Status: AC | PRN
  Administered 2017-10-09: 100 mL via INTRAVENOUS

## 2017-10-09 MED ORDER — ADALIMUMAB 80 MG/0.8ML ~~LOC~~ AJKT: 80.0000 mg | AUTO-INJECTOR | SUBCUTANEOUS | 3 refills | Status: DC

## 2017-10-09 NOTE — Telephone Encounter (Signed)
Her pain has definitely improved overnight.  She started Cipro Flagyl.  I examined her in the office this afternoon.  Perianal firmness left side buttocks about 3 or 4 cm from the anus approximately 2 cm across..  This was firm but I was able to express some small amount of purulence from 2 small pinholes with squeezing.  Perianal firmness 1 to 2 cm from the anus on the right side.  This was not tender and I could not express any purulence.   1. She is going to continue decreasing her prednisone, 20 mg tomorrow, 10 mg Friday and Saturday and then off. 2. She will continue her Cipro and Flagyl 3. I have been in communication with Dr. gross and he may be able to examine her under anesthesia, I&D if necessary, seton placement possibly this Friday or early next week. 4. Start Biologics and immunomodulators after perianal infection is cleared, hopefully in the next 1 to 2 weeks. 5. Oral iron supplement once daily starting today    Richardson Landry, Are you still able to EUS, seton this Friday or next week?  I will have her start holding her eliquis tomorrow in case Friday works. Please contact me on my cell so I can let her know the plan.  Thanks  [place]

## 2017-10-10 ENCOUNTER — Telehealth: Payer: Self-pay | Admitting: Cardiology

## 2017-10-10 ENCOUNTER — Other Ambulatory Visit: Payer: Self-pay

## 2017-10-10 ENCOUNTER — Ambulatory Visit (HOSPITAL_BASED_OUTPATIENT_CLINIC_OR_DEPARTMENT_OTHER): Admission: RE | Admit: 2017-10-10 | Payer: 59 | Source: Ambulatory Visit | Admitting: Surgery

## 2017-10-10 ENCOUNTER — Encounter (HOSPITAL_BASED_OUTPATIENT_CLINIC_OR_DEPARTMENT_OTHER): Admission: RE | Payer: Self-pay | Source: Ambulatory Visit

## 2017-10-10 ENCOUNTER — Encounter (HOSPITAL_BASED_OUTPATIENT_CLINIC_OR_DEPARTMENT_OTHER): Payer: Self-pay | Admitting: *Deleted

## 2017-10-10 SURGERY — HEMORRHOIDECTOMY
Anesthesia: General

## 2017-10-10 MED ORDER — GENTAMICIN SULFATE 40 MG/ML IJ SOLN
5.0000 mg/kg | INTRAVENOUS | Status: AC
  Administered 2017-10-11: 340 mg via INTRAVENOUS
  Filled 2017-10-10 (×2): qty 8.5

## 2017-10-10 MED ORDER — BUPIVACAINE LIPOSOME 1.3 % IJ SUSP
20.0000 mL | INTRAMUSCULAR | Status: DC
  Filled 2017-10-10: qty 20

## 2017-10-10 MED ORDER — CLINDAMYCIN PHOSPHATE 900 MG/50ML IV SOLN
900.0000 mg | INTRAVENOUS | Status: AC
  Administered 2017-10-11: 900 mg via INTRAVENOUS
  Filled 2017-10-10: qty 50

## 2017-10-10 NOTE — Progress Notes (Signed)
Pt add-on case for tomorrow 10-11-2017.  Noted pt on eliquis and per special needs on posted case stated pt stopping eliquis 2 days prior to surgery.  Pt cardiologist, dr Curt Bears, follows pt for eliquis.  No clearance noted in epic or telephone clearance noted in epic for this procedure.  Today at 1230, Called and spoke w/ wendy, triage nurse for dr gross, stated she will call after inquiring about clearance.  Received called back from [place], triage for dr gross, questioned why cardiac clearance for previously scheduled case for hemorrhoidectomy dated 09-04-2017 was not okay and that dr gross and dr Ardis Hughs ([facility]) had discussed about eliquis and dr Ardis Hughs told pt to stop eliquis (noted dated 10-09-2017).  Explained to [place] that pt has had a procedure , colonoscopy, in which had held her eliquis for , so clearance needs to be given for this scheduled/ posted case which is different than previous procedures.  This is per dr gross pre-op orders and per guidelines. Informed alicia she would need to call pt cardiologist , dr Curt Bears, via telephone to get clearance and this would be documented in epic letting them know of urgent case tomorrow.  Elmo Putt verbalized understanding and will monitor in epic for clearance.

## 2017-10-10 NOTE — Progress Notes (Signed)
SPOKE W/ PT VIA PHONE FOR PRE-OP INTERVIEW.  NPO AFTER MN W/ EXCEPTION CLEAR LIQUID DIET UNTIL 1230 PM DOS THEN NOTHING BY MOUTH AFTER 1230PM, PT VERBALIZED UNDERSTANDING.  ANSWERED PT QUESTIONS ABOUT RECTAL PREP, SHE STARTED LIQUIDS/ PUREE FOOD THIS AFTERNOON AND TOOK MILK OF MAGNESIA AT 1400 AND PT STATED WILL TAKE AGAIN AT 1800 WHEN SHE GETS HOME IF PREVIOUS HAD NO EFFECT.  PT WILL TAKE TOPROL, CARDIZEM, AND PREDNISONE AM DOS.  CURRENT LAB RESULTS IN Epic, DATED 10-02-2017, AND EKG, DATED 01-17-2017 IN Epic.  PER PT WAS GIVEN INSTRUCTIONS FROM DR JACOBS TO STOP ELIQUIS TODAY, SO HER LAST WAS Wednesday EVENING 10-09-2017.  CARDIAC CLEARANCE FOR ELIQUIS PENDING, HOPEFULLY WE BE DONE IN Epic BEFORE SURGERY TOMORROW IF NOT DONE TODAY.

## 2017-10-10 NOTE — Telephone Encounter (Signed)
   Primary Cardiologist: Will Meredith Leeds, MD  Chart reviewed as part of pre-operative protocol coverage. Patient was contacted 10/10/2017 in reference to pre-operative risk assessment for pending surgery as outlined below.  Julie Clark was last seen on 04/22/17 by Dr. Curt Bears.  Since that day, Julie Clark has done well without any new symptoms. She can complete greater 4.0 METS.   Per pharmacy,  Pt takes Eliquis for afib with CHADS2VASc score of 2 (sex, HTN). Renal function is normal. Ok to hold Eliquis today for procedure tomorrow.         Please restart as soon as safe per surgeon.  Therefore, based on ACC/AHA guidelines, the patient would be at acceptable risk for the planned procedure without further cardiovascular testing.   I will route this recommendation to the requesting party via Epic fax function and remove from pre-op pool.  Please call with questions.  Ledora Bottcher, PA 10/10/2017, 3:33 PM

## 2017-10-10 NOTE — Telephone Encounter (Signed)
Pt takes Eliquis for afib with CHADS2VASc score of 2 (sex, HTN). Renal function is normal. Ok to hold Eliquis today for procedure tomorrow.

## 2017-10-10 NOTE — Telephone Encounter (Signed)
New Message:         [facility] Pre-operative Risk Assessment    Request for surgical clearance:  1. What type of surgery is being performed? Rectal abscess  2. When is this surgery scheduled? 10/11/17  3. What type of clearance is required (medical clearance vs. Pharmacy clearance to hold med vs. Both)? both  4. Are there any medications that need to be held prior to surgery and how long? Eliquis  5. Practice name and name of physician performing surgery? [facility]   6. What is your office phone number [phone]   7.   What is your office fax number [phone]  8.   Anesthesia type (None, local, MAC, general) ? General     Pt has had a previous clearance but due to some things occurring she is having to get this surgery quickly. It has been marked as urgent from Dr. Clyda Greener office.   Julie Clark 10/10/2017, 2:04 PM  _________________________________________________________________   (provider comments below)

## 2017-10-11 ENCOUNTER — Ambulatory Visit (HOSPITAL_BASED_OUTPATIENT_CLINIC_OR_DEPARTMENT_OTHER)
Admission: RE | Admit: 2017-10-11 | Discharge: 2017-10-11 | Disposition: A | Discharge status: Home / Self Care | Payer: 59 | Source: Ambulatory Visit | Attending: Surgery | Admitting: Surgery

## 2017-10-11 ENCOUNTER — Encounter (HOSPITAL_BASED_OUTPATIENT_CLINIC_OR_DEPARTMENT_OTHER): Payer: Self-pay | Admitting: Anesthesiology

## 2017-10-11 ENCOUNTER — Ambulatory Visit (HOSPITAL_BASED_OUTPATIENT_CLINIC_OR_DEPARTMENT_OTHER): Payer: 59 | Admitting: Certified Registered"

## 2017-10-11 ENCOUNTER — Encounter (HOSPITAL_BASED_OUTPATIENT_CLINIC_OR_DEPARTMENT_OTHER)
Admission: RE | Disposition: A | Discharge status: Home / Self Care | Payer: Self-pay | Source: Ambulatory Visit | Attending: Surgery

## 2017-10-11 DIAGNOSIS — Z7901 Long term (current) use of anticoagulants: Secondary | ICD-10-CM | POA: Insufficient documentation

## 2017-10-11 DIAGNOSIS — R011 Cardiac murmur, unspecified: Secondary | ICD-10-CM | POA: Insufficient documentation

## 2017-10-11 DIAGNOSIS — I4891 Unspecified atrial fibrillation: Secondary | ICD-10-CM | POA: Insufficient documentation

## 2017-10-11 DIAGNOSIS — K641 Second degree hemorrhoids: Secondary | ICD-10-CM | POA: Diagnosis not present

## 2017-10-11 DIAGNOSIS — I10 Essential (primary) hypertension: Secondary | ICD-10-CM | POA: Diagnosis not present

## 2017-10-11 DIAGNOSIS — K644 Residual hemorrhoidal skin tags: Secondary | ICD-10-CM

## 2017-10-11 DIAGNOSIS — Z888 Allergy status to other drugs, medicaments and biological substances status: Secondary | ICD-10-CM | POA: Diagnosis not present

## 2017-10-11 DIAGNOSIS — Z79899 Other long term (current) drug therapy: Secondary | ICD-10-CM | POA: Insufficient documentation

## 2017-10-11 DIAGNOSIS — K645 Perianal venous thrombosis: Secondary | ICD-10-CM | POA: Diagnosis not present

## 2017-10-11 DIAGNOSIS — Z91040 Latex allergy status: Secondary | ICD-10-CM | POA: Diagnosis not present

## 2017-10-11 DIAGNOSIS — K50114 Crohn's disease of large intestine with abscess: Secondary | ICD-10-CM | POA: Diagnosis present

## 2017-10-11 DIAGNOSIS — K603 Anal fistula: Secondary | ICD-10-CM

## 2017-10-11 DIAGNOSIS — K6289 Other specified diseases of anus and rectum: Secondary | ICD-10-CM | POA: Insufficient documentation

## 2017-10-11 DIAGNOSIS — K611 Rectal abscess: Secondary | ICD-10-CM | POA: Diagnosis not present

## 2017-10-11 DIAGNOSIS — K50113 Crohn's disease of large intestine with fistula: Secondary | ICD-10-CM | POA: Insufficient documentation

## 2017-10-11 DIAGNOSIS — K219 Gastro-esophageal reflux disease without esophagitis: Secondary | ICD-10-CM | POA: Insufficient documentation

## 2017-10-11 DIAGNOSIS — K620 Anal polyp: Secondary | ICD-10-CM | POA: Insufficient documentation

## 2017-10-11 DIAGNOSIS — K610 Anal abscess: Secondary | ICD-10-CM | POA: Insufficient documentation

## 2017-10-11 DIAGNOSIS — Z880 Allergy status to penicillin: Secondary | ICD-10-CM | POA: Diagnosis not present

## 2017-10-11 DIAGNOSIS — K649 Unspecified hemorrhoids: Secondary | ICD-10-CM | POA: Diagnosis not present

## 2017-10-11 DIAGNOSIS — K643 Fourth degree hemorrhoids: Secondary | ICD-10-CM | POA: Diagnosis not present

## 2017-10-11 DIAGNOSIS — K604 Rectal fistula: Secondary | ICD-10-CM | POA: Diagnosis not present

## 2017-10-11 DIAGNOSIS — K60329 Anal fistula, complex, unspecified: Secondary | ICD-10-CM

## 2017-10-11 HISTORY — DX: Leiomyoma of uterus, unspecified: D25.9

## 2017-10-11 HISTORY — DX: Unspecified hemorrhoids: K64.9

## 2017-10-11 HISTORY — DX: Personal history of diseases of the skin and subcutaneous tissue: Z87.2

## 2017-10-11 HISTORY — DX: Crohn's disease of large intestine without complications: K50.10

## 2017-10-11 HISTORY — PX: PLACEMENT OF SETON: SHX6029

## 2017-10-11 HISTORY — DX: Presence of spectacles and contact lenses: Z97.3

## 2017-10-11 HISTORY — DX: Anal fistula: K60.3

## 2017-10-11 HISTORY — DX: Other specified diseases of anus and rectum: K62.89

## 2017-10-11 HISTORY — PX: INCISION AND DRAINAGE ABSCESS: SHX5864

## 2017-10-11 HISTORY — PX: RECTAL EXAM UNDER ANESTHESIA: SHX6399

## 2017-10-11 HISTORY — DX: Paroxysmal atrial fibrillation: I48.0

## 2017-10-11 HISTORY — DX: Long term (current) use of anticoagulants: Z79.01

## 2017-10-11 SURGERY — EXAM UNDER ANESTHESIA, RECTUM
Anesthesia: General

## 2017-10-11 MED ORDER — PROMETHAZINE HCL 25 MG/ML IJ SOLN
6.2500 mg | INTRAMUSCULAR | Status: DC | PRN
  Filled 2017-10-11: qty 1

## 2017-10-11 MED ORDER — MIDAZOLAM HCL 2 MG/2ML IJ SOLN
INTRAMUSCULAR | Status: DC | PRN
  Administered 2017-10-11: 2 mg via INTRAVENOUS

## 2017-10-11 MED ORDER — PROPOFOL 10 MG/ML IV BOLUS
INTRAVENOUS | Status: DC | PRN
  Administered 2017-10-11: 200 mg via INTRAVENOUS

## 2017-10-11 MED ORDER — LACTATED RINGERS IV SOLN
INTRAVENOUS | Status: DC
  Administered 2017-10-11 (×2): via INTRAVENOUS
  Filled 2017-10-11: qty 1000

## 2017-10-11 MED ORDER — ACETAMINOPHEN 10 MG/ML IV SOLN
1000.0000 mg | Freq: Once | INTRAVENOUS | Status: DC | PRN
  Filled 2017-10-11: qty 100

## 2017-10-11 MED ORDER — ONDANSETRON HCL 4 MG/2ML IJ SOLN
INTRAMUSCULAR | Status: DC | PRN
  Administered 2017-10-11: 4 mg via INTRAVENOUS

## 2017-10-11 MED ORDER — FENTANYL CITRATE (PF) 100 MCG/2ML IJ SOLN
INTRAMUSCULAR | Status: AC
  Filled 2017-10-11: qty 2

## 2017-10-11 MED ORDER — CLINDAMYCIN PHOSPHATE 900 MG/50ML IV SOLN
INTRAVENOUS | Status: DC | PRN
  Administered 2017-10-11: 900 mg via INTRAVENOUS

## 2017-10-11 MED ORDER — MIDAZOLAM HCL 2 MG/2ML IJ SOLN
INTRAMUSCULAR | Status: AC
  Filled 2017-10-11: qty 2

## 2017-10-11 MED ORDER — LIDOCAINE 2% (20 MG/ML) 5 ML SYRINGE
INTRAMUSCULAR | Status: AC
  Filled 2017-10-11: qty 5

## 2017-10-11 MED ORDER — ARTIFICIAL TEARS OPHTHALMIC OINT
TOPICAL_OINTMENT | OPHTHALMIC | Status: DC | PRN
  Administered 2017-10-11: 1 via OPHTHALMIC

## 2017-10-11 MED ORDER — BUPIVACAINE-EPINEPHRINE 0.25% -1:200000 IJ SOLN
INTRAMUSCULAR | Status: DC | PRN
  Administered 2017-10-11: 30 mL

## 2017-10-11 MED ORDER — PHENYLEPHRINE 40 MCG/ML (10ML) SYRINGE FOR IV PUSH (FOR BLOOD PRESSURE SUPPORT)
PREFILLED_SYRINGE | INTRAVENOUS | Status: AC
  Filled 2017-10-11: qty 10

## 2017-10-11 MED ORDER — DEXAMETHASONE SODIUM PHOSPHATE 10 MG/ML IJ SOLN
INTRAMUSCULAR | Status: AC
  Filled 2017-10-11: qty 1

## 2017-10-11 MED ORDER — GABAPENTIN 300 MG PO CAPS
300.0000 mg | ORAL_CAPSULE | ORAL | Status: AC
  Administered 2017-10-11: 300 mg via ORAL
  Filled 2017-10-11: qty 1

## 2017-10-11 MED ORDER — SUGAMMADEX SODIUM 500 MG/5ML IV SOLN
INTRAVENOUS | Status: DC | PRN
  Administered 2017-10-11: 200 mg via INTRAVENOUS

## 2017-10-11 MED ORDER — GABAPENTIN 300 MG PO CAPS
300.0000 mg | ORAL_CAPSULE | Freq: Once | ORAL | Status: DC
  Filled 2017-10-11: qty 1

## 2017-10-11 MED ORDER — SUGAMMADEX SODIUM 200 MG/2ML IV SOLN
INTRAVENOUS | Status: AC
  Filled 2017-10-11: qty 2

## 2017-10-11 MED ORDER — BUPIVACAINE LIPOSOME 1.3 % IJ SUSP
INTRAMUSCULAR | Status: DC | PRN
  Administered 2017-10-11: 20 mL

## 2017-10-11 MED ORDER — CHLORHEXIDINE GLUCONATE CLOTH 2 % EX PADS
6.0000 | MEDICATED_PAD | Freq: Once | CUTANEOUS | Status: DC
  Filled 2017-10-11: qty 6

## 2017-10-11 MED ORDER — DIBUCAINE 1 % RE OINT
TOPICAL_OINTMENT | RECTAL | Status: DC | PRN
  Administered 2017-10-11: 1 via RECTAL

## 2017-10-11 MED ORDER — ACETAMINOPHEN 500 MG PO TABS
1000.0000 mg | ORAL_TABLET | ORAL | Status: AC
  Administered 2017-10-11: 1000 mg via ORAL
  Filled 2017-10-11: qty 2

## 2017-10-11 MED ORDER — HYDROCODONE-ACETAMINOPHEN 7.5-325 MG PO TABS
1.0000 | ORAL_TABLET | Freq: Once | ORAL | Status: DC | PRN
  Filled 2017-10-11: qty 1

## 2017-10-11 MED ORDER — ROCURONIUM BROMIDE 10 MG/ML (PF) SYRINGE
PREFILLED_SYRINGE | INTRAVENOUS | Status: DC | PRN
  Administered 2017-10-11: 40 mg via INTRAVENOUS

## 2017-10-11 MED ORDER — PHENYLEPHRINE HCL 10 MG/ML IJ SOLN
INTRAMUSCULAR | Status: DC | PRN
  Administered 2017-10-11 (×3): 120 ug via INTRAVENOUS

## 2017-10-11 MED ORDER — ACETAMINOPHEN 325 MG PO TABS
650.0000 mg | ORAL_TABLET | Freq: Once | ORAL | Status: DC
  Filled 2017-10-11: qty 2

## 2017-10-11 MED ORDER — MEPERIDINE HCL 25 MG/ML IJ SOLN
6.2500 mg | INTRAMUSCULAR | Status: DC | PRN
  Filled 2017-10-11: qty 1

## 2017-10-11 MED ORDER — OXYCODONE HCL 5 MG PO TABS: 5.0000 mg | ORAL_TABLET | Freq: Four times a day (QID) | ORAL | 0 refills | Status: DC | PRN

## 2017-10-11 MED ORDER — FENTANYL CITRATE (PF) 100 MCG/2ML IJ SOLN
INTRAMUSCULAR | Status: DC | PRN
  Administered 2017-10-11: 100 ug via INTRAVENOUS

## 2017-10-11 MED ORDER — ROCURONIUM BROMIDE 10 MG/ML (PF) SYRINGE
PREFILLED_SYRINGE | INTRAVENOUS | Status: AC
  Filled 2017-10-11: qty 5

## 2017-10-11 MED ORDER — LIDOCAINE HCL (CARDIAC) PF 50 MG/5ML IV SOSY
PREFILLED_SYRINGE | INTRAVENOUS | Status: DC | PRN
  Administered 2017-10-11: 99.8 mg via INTRAVENOUS

## 2017-10-11 MED ORDER — HYDROMORPHONE HCL 1 MG/ML IJ SOLN
INTRAMUSCULAR | Status: AC
  Filled 2017-10-11: qty 1

## 2017-10-11 MED ORDER — ONDANSETRON HCL 4 MG/2ML IJ SOLN
INTRAMUSCULAR | Status: AC
  Filled 2017-10-11: qty 2

## 2017-10-11 MED ORDER — LIDOCAINE HCL (CARDIAC) PF 50 MG/5ML IV SOSY: PREFILLED_SYRINGE | INTRAVENOUS | Status: DC | PRN

## 2017-10-11 MED ORDER — METHYLENE BLUE 0.5 % INJ SOLN
INTRAVENOUS | Status: DC | PRN
  Administered 2017-10-11: 1 mL via SUBMUCOSAL

## 2017-10-11 MED ORDER — DEXAMETHASONE SODIUM PHOSPHATE 10 MG/ML IJ SOLN
INTRAMUSCULAR | Status: DC | PRN
  Administered 2017-10-11: 10 mg via INTRAVENOUS

## 2017-10-11 MED ORDER — HYDROMORPHONE HCL 1 MG/ML IJ SOLN
0.2500 mg | INTRAMUSCULAR | Status: DC | PRN
  Administered 2017-10-11 (×2): 0.25 mg via INTRAVENOUS
  Filled 2017-10-11: qty 0.5

## 2017-10-11 MED ORDER — PROPOFOL 10 MG/ML IV BOLUS
INTRAVENOUS | Status: AC
  Filled 2017-10-11: qty 20

## 2017-10-11 MED FILL — oxyCODONE HCL 5 MG TABS: 5 | 4 days supply | Qty: 30 | Fill #0

## 2017-10-11 SURGICAL SUPPLY — 53 items
BENZOIN TINCTURE PRP APPL 2/3 (GAUZE/BANDAGES/DRESSINGS) ×2 IMPLANT
BLADE CLIPPER SURG (BLADE) IMPLANT
BLADE HEX COATED 2.75 (ELECTRODE) ×2 IMPLANT
BLADE SURG 10 STRL SS (BLADE) IMPLANT
BLADE SURG 15 STRL LF DISP TIS (BLADE) ×1 IMPLANT
BLADE SURG 15 STRL SS (BLADE) ×1
BRIEF STRETCH FOR OB PAD LRG (UNDERPADS AND DIAPERS) ×2 IMPLANT
CANISTER SUCTION 1200CC (MISCELLANEOUS) ×2 IMPLANT
COVER BACK TABLE 60X90IN (DRAPES) ×2 IMPLANT
DRAPE LAPAROTOMY 100X72 PEDS (DRAPES) ×2 IMPLANT
DRAPE LG THREE QUARTER DISP (DRAPES) IMPLANT
DRAPE UNDERBUTTOCKS STRL (DRAPE) ×2 IMPLANT
DRSG PAD ABDOMINAL 8X10 ST (GAUZE/BANDAGES/DRESSINGS) ×2 IMPLANT
ELECT NEEDLE TIP 2.8 STRL (NEEDLE) IMPLANT
ELECT REM PT RETURN 9FT ADLT (ELECTROSURGICAL) ×2
ELECTRODE REM PT RTRN 9FT ADLT (ELECTROSURGICAL) ×1 IMPLANT
GAUZE SPONGE 4X4 12PLY STRL (GAUZE/BANDAGES/DRESSINGS) IMPLANT
GAUZE SPONGE 4X4 16PLY XRAY LF (GAUZE/BANDAGES/DRESSINGS) IMPLANT
GAUZE SPONGE 4X4 8PLY STR LF (GAUZE/BANDAGES/DRESSINGS) ×2 IMPLANT
GLOVE ECLIPSE 8.0 STRL XLNG CF (GLOVE) IMPLANT
GLOVE INDICATOR 8.0 STRL GRN (GLOVE) IMPLANT
GOWN STRL REUS W/TWL XL LVL3 (GOWN DISPOSABLE) ×2 IMPLANT
IV CATH 14GX2 1/4 (CATHETERS) IMPLANT
IV CATH PLACEMENT 20 GA (IV SOLUTION) IMPLANT
KIT TURNOVER CYSTO (KITS) ×2 IMPLANT
LEGGING LITHOTOMY PAIR STRL (DRAPES) IMPLANT
LOOP VESSEL MAXI BLUE (MISCELLANEOUS) IMPLANT
NEEDLE HYPO 22GX1.5 SAFETY (NEEDLE) ×2 IMPLANT
PACK BASIN DAY SURGERY FS (CUSTOM PROCEDURE TRAY) ×2 IMPLANT
PAD ABD 8X10 STRL (GAUZE/BANDAGES/DRESSINGS) ×2 IMPLANT
PAD PREP 24X48 CUFFED NSTRL (MISCELLANEOUS) ×2 IMPLANT
PENCIL BUTTON HOLSTER BLD 10FT (ELECTRODE) ×2 IMPLANT
SURGILUBE 2OZ TUBE FLIPTOP (MISCELLANEOUS) ×2 IMPLANT
SUT CHROMIC 2 0 SH (SUTURE) ×2 IMPLANT
SUT CHROMIC 3 0 SH 27 (SUTURE) ×2 IMPLANT
SUT ETHIBOND 0 (SUTURE) IMPLANT
SUT MNCRL AB 4-0 PS2 18 (SUTURE) IMPLANT
SUT PROLENE 2 0 SH DA (SUTURE) IMPLANT
SUT VIC AB 2-0 UR6 27 (SUTURE) ×2 IMPLANT
SUT VIC AB 3-0 SH 18 (SUTURE) IMPLANT
SWAB COLLECTION DEVICE MRSA (MISCELLANEOUS) IMPLANT
SYR 20CC LL (SYRINGE) ×2 IMPLANT
SYR BULB IRRIGATION 50ML (SYRINGE) IMPLANT
SYR CONTROL 10ML LL (SYRINGE) IMPLANT
TAPE CLOTH 3X10 TAN LF (GAUZE/BANDAGES/DRESSINGS) ×2 IMPLANT
TOWEL OR 17X24 6PK STRL BLUE (TOWEL DISPOSABLE) ×4 IMPLANT
TRAY DSU PREP LF (CUSTOM PROCEDURE TRAY) ×2 IMPLANT
TUBE ANAEROBIC SPECIMEN COL (MISCELLANEOUS) IMPLANT
TUBE CONNECTING 12X1/4 (SUCTIONS) ×2 IMPLANT
UNDERPAD 30X30 (UNDERPADS AND DIAPERS) ×2 IMPLANT
WATER STERILE IRR 500ML POUR (IV SOLUTION) ×2 IMPLANT
YANKAUER SUCT BULB TIP 10FT TU (MISCELLANEOUS) ×2 IMPLANT
YANKAUER SUCT BULB TIP NO VENT (SUCTIONS) ×2 IMPLANT

## 2017-10-11 NOTE — Op Note (Signed)
10/11/2017  3:36 PM  PATIENT:  Julie Clark  62 y.o. female  Patient Care Team: Janith Lima, MD as PCP - General (Internal Medicine) Constance Haw, MD as PCP - Cardiology (Cardiology) Michael Boston, MD as Consulting Physician (General Surgery) Milus Banister, MD as Attending Physician (Gastroenterology)  PRE-OPERATIVE DIAGNOSIS:    [place].,  POST-OPERATIVE DIAGNOSIS:    PERIRECTAL TRANSSPHINCTERIC FISTULOUS DISEASE  PERIANAL ABSCESSES GRADE 2 INTERNAL HEMORRHOIDS EXTERNAL HEMORRHOIDS  PROCEDURE:  ANORECTAL EXAM UNDER ANESTHESIA DRAINAGE OF PERIRECTAL ABSCESSES PLACEMENT OF SETON EXTERNAL HEMORRHOIDECTOMY X1 INTERNAL HEMORRHOID LIGATION  SURGEON:  [name], MD  ASSISTANT: OR Staff   ANESTHESIA:   Local field block Anorectal block General  0.25% bupivacaine with epinephrine at the beginning of the case.  Liposomal bupivacaine (Experel) at the end of the case.  EBL:  Total I/O In: 500 [I.V.:500] Out: - .  See anesthesia record  Delay start of Pharmacological VTE agent (>24hrs) due to surgical blood loss or risk of bleeding:  no  DRAINS: none   SPECIMEN:    1.  Left posterior fistulous tract 2.  Left anterior perianal cyst 3.  Right anterior perianal cyst 4.  Anal canal polyp  5.  External hemorrhoid  DISPOSITION OF SPECIMEN:  PATHOLOGY  COUNTS:  YES  PLAN OF CARE: Discharge to home after PACU  PATIENT DISPOSITION:  PACU - hemodynamically stable.  INDICATION: Patient with probable perirectal fistula by MRI & recently diagnosed Crohns disease.  Symptomatic external hemorrhoids.  I recommended examination and surgical treatment:  The anatomy & physiology of the anorectal region was discussed.  We discussed the pathophysiology of anorectal abscess and fistula.  Differential diagnosis was discussed.  Natural history progression was discussed.   I stressed the importance of a  bowel regimen to have daily soft bowel movements to minimize progression of disease.     The patient's condition is not adequately controlled.  Non-operative treatment has not healed the fistula.  Therefore, I recommended examination under anaesthesia to confirm the diagnosis and treat the fistula.  I discussed techniques that may be required such as fistulotomy, ligation by LIFT technique, and/or seton placement.  Benefits & alternatives discussed.  I noted a good likelihood this will help address the problem, but sometimes repeat operations and prolonged healing times may occur.  Risks such as bleeding, pain, recurrence, reoperation, incontinence, heart attack, death, and other risks were discussed.      Educational handouts further explaining the pathology, treatment options, and bowel regimen were given.  The patient expressed understanding & wishes to proceed.  We will work to coordinate surgery for a mutually convenient time.   OR FINDINGS:   Patient had a transphincteric fistula.    External location LEFT POSTERIOR  about 6 cm from anal verge. Internal location : Posterior midline at anal crypt about 1.5 cm from anal verge with pedunculated anal canal polyp.  Large external hemorrhoids.  Posterior midline rather pedunculated and near the fistula.  Excised.  Anterior hemorrhoids not as large nor is inflamed, so left in place given Crohn's disease.  Numerous pustules perianally.  Almost like hidradenitis of the anus.  Largest right anterior excised with 20 x 15 mm wound.  Left anterior 8 mm wound.  Few small other areas opened up.  No proof of other fistulae.  DESCRIPTION:   Informed consent was confirmed. Patient underwent general anesthesia without difficulty. Patient was placed into prone positioning.  The perianal  region was prepped and draped in sterile fashion. Surgical timeout confirmed or plan.  I did digital rectal examination and then transitioned over to anoscopy to get a sense  of the anatomy.  Numerous tiny to medium sized pustule openings.  Scarring in the left posterior aspect consistent with prior abscess drainage.  This correlated where the suspicious area a of a fistula by MRI.  I was able to place a probe through the external draining opening in the left posterior perianal region.  Initially it did not track deeply.  I excised around the scar and opening circumferentially to straighten out the tract.  Was able to inject with methylene blue to confirm the fistulous tract.  I then could more easily pass the fistulous probe.   It easily came down to the posterior midline anal canal.  I passed a blue vessel tape with help of a fistulous probe through the tract for a seton.  I excised the external opening in the external half of the fistulous tract down to the ischiorectal fat  I noted numerous other sinuses.  Probing did not seem to track more deeply.  There was an inflamed cluster in the right anterior aspect.  Ended up excising that region of skin come to healthy subcutaneous tissue.  Smaller pustulous cyst in the left anterior aspect excised as well.  Few small areas opened up.  No other fistulous tract noted.  The posterior midline external hemorrhoid was thickened and inflamed and pedunculated.  I ended up excising it since the anal canal was not significantly involved and it was at the level of the fistulous tract.  Closed the resulting wound with 2-0 chromic interrupted horizontal mattress suture.  This left a more permanent open region to allow the seton to lay well.   This avoided any narrowing.  Because she had some enlarged internal hemorrhoids with some rectal wall redundancy, I did do internal hemorrhoidal ligation of the left lateral and right posterior hemorrhoidal piles with 2-0 Vicryl suture.  The right posterior seemed friable and irritated.  After ligation it was quiet with good hemostasis.  I avoided any pexy or anything more aggressive.  While she did have some  left and right anterior external hemorrhoids mildly enlarged near the introitus, I decided to avoid excising these regions since she already had one in hemorrhoidectomy and had Crohn's disease.  They do not seem particularly inflamed.  About stricture or healing in the setting of inflammatory bowel disease  I placed silk sutures around the vessel tape to have a snug but not tight seton at the left posterior transsphincteric fistula.  Reexamined the anal canal.   There is was no narrowing.  I repeated anoscopy and examination.  Hemostasis was good.  We placed fluff gauze to onlay over the wounds.  No packing done.  Patient is being extubated go to recovery room.  I am about to discuss the patient's status to the family.  Instructions are written as well.  [name], M.D., F.A.C.S. Gastrointestinal and Minimally Invasive Surgery [facility] [address], [place], [place] [address] [phone] Main / Paging

## 2017-10-11 NOTE — Anesthesia Postprocedure Evaluation (Signed)
Anesthesia Post Note  Patient: Julie Clark  Procedure(s) Performed: ANORECTAL EXAM UNDER ANESTHESIA (N/A ) DRAINAGE OF PERIRECTAL ABSCESSES (N/A ) PLACEMENT OF SETON (N/A )     Patient location during evaluation: PACU Anesthesia Type: General Level of consciousness: awake and alert Pain management: pain level controlled Vital Signs Assessment: post-procedure vital signs reviewed and stable Respiratory status: spontaneous breathing, nonlabored ventilation, respiratory function stable and patient connected to nasal cannula oxygen Cardiovascular status: blood pressure returned to baseline and stable Postop Assessment: no apparent nausea or vomiting Anesthetic complications: no    Last Vitals:  Vitals:   10/11/17 1600 10/11/17 1615  BP: 123/72 135/71  Pulse: 68 61  Resp: 16 17  Temp:    SpO2: 100% 99%    Last Pain:  Vitals:   10/11/17 1615  TempSrc:   PainSc: 3                  Barnet Glasgow

## 2017-10-11 NOTE — Transfer of Care (Signed)
Immediate Anesthesia Transfer of Care Note  Patient: Julie Clark  Procedure(s) Performed: ANORECTAL EXAM UNDER ANESTHESIA (N/A ) DRAINAGE OF PERIRECTAL ABSCESSES (N/A ) PLACEMENT OF SETON (N/A )  Patient Location: PACU  Anesthesia Type:General  Level of Consciousness: awake, alert , oriented and patient cooperative  Airway & Oxygen Therapy: Patient Spontanous Breathing and Patient connected to nasal cannula oxygen  Post-op Assessment: Report given to RN and Post -op Vital signs reviewed and stable  Post vital signs: Reviewed and stable  Last Vitals:  Vitals Value Taken Time  BP    Temp    Pulse 77 10/11/2017  3:51 PM  Resp    SpO2 99 % 10/11/2017  3:51 PM  Vitals shown include unvalidated device data.  Last Pain:  Vitals:   10/11/17 1355  TempSrc:   PainSc: 0-No pain      Patients Stated Pain Goal: 3 (73/71/06 2694)  Complications: No apparent anesthesia complications

## 2017-10-11 NOTE — Discharge Instructions (Signed)
ANORECTAL SURGERY:  POST OPERATIVE INSTRUCTIONS  ######################################################################  EAT Start with a pureed / full liquid diet After 24 hours, gradually transition to a high fiber diet.    CONTROL PAIN Control pain so you can tolerate bowel movements,  walk, sleep, tolerate sneezing/coughing, and go up/down stairs.   HAVE A BOWEL MOVEMENT DAILY Keep your bowels regular to avoid problems.   Taking a fiber supplement every day to keep bowels soft.   Try a laxative to override constipation. Use an antidairrheal to slow down diarrhea.   Call if not better after 2 tries  WALK Walk an hour a day.  Control your pain to do that.   CALL IF YOU HAVE PROBLEMS/CONCERNS Call if you are still struggling despite following these instructions. Call if you have concerns not answered by these instructions  ######################################################################    1. Take your usually prescribed home medications unless otherwise directed. 2. DIET: Follow a light bland diet the first 24 hours after arrival home, such as soup, liquids, crackers, etc.  Be sure to include lots of fluids daily.  Avoid fast food or heavy meals as your are more likely to get nauseated.  Eat a low fat the next few days after surgery.   3. PAIN CONTROL: a. Pain is best controlled by a usual combination of three different methods TOGETHER: i. Ice/Heat ii. Over the counter pain medication iii. Prescription pain medication b. Expect swelling and discomfort in the anus/rectal area.  Warm water baths (30-60 minutes up to 6 times a day, especially after bowel meovements) will help. Use ice for the first few days to help decrease swelling and bruising, then switch to heat such as warm towels, sitz baths, warm baths, etc to help relax tight/sore spots and speed recovery.  Some people prefer to use ice alone, heat alone, alternating between ice & heat.  Experiment to what works  for you.   c. It is helpful to take an over-the-counter pain medication continuously for the first few weeks.  Choose Acetaminophen (Tylenol, etc) 650-1025m four times a day (every meal & bedtime) d. A  prescription for pain medication (such as oxycodone, hydrocodone, etc) should be given to you upon discharge.  Take your pain medication as prescribed.  i. If you are having problems/concerns with the prescription medicine (does not control pain, nausea, vomiting, rash, itching, etc), please call [place]([phone] see if we need to switch you to a different pain medicine that will work better for you and/or control your side effect better. ii. If you need a refill on your pain medication, please contact your pharmacy.  They will contact our office to request authorization. Prescriptions will not be filled after 5 pm or on week-ends.  If can take up to 48 hours for it to be filled & ready so avoid waiting until you are down to thel ast pill. e. A topical cream (Dibucaine) or a prescription for a cream (such as diltiazem 2% gel) may be given to you.  Many people find relief with topical creams.  Some people find it burns too much.  Experiment.  If it helps, use it.  If it burns, don't using it.  Use a Sitz Bath 4-8 times a day for relief   [employer] sitz bath is a warm water bath taken in the sitting position that covers only the hips and buttocks. It may be used for either healing or hygiene purposes. Sitz baths are also used to relieve pain, itching,  or muscle spasms. The water may contain medicine. Moist heat will help you heal and relax.  HOME CARE INSTRUCTIONS  Take 3 to 4 sitz baths a day. 1. Fill the bathtub half full with warm water. 2. Sit in the water and open the drain a little. 3. Turn on the warm water to keep the tub half full. Keep the water running constantly. 4. Soak in the water for 15 to 20 minutes. 5. After the sitz bath, pat the affected area dry first.   4. KEEP YOUR  BOWELS REGULAR a. The goal is one soft bowel movement a day b. Avoid getting constipated.  Between the surgery and the pain medications, it is common to experience some constipation.  Increasing fluid intake and taking a fiber supplement (such as Metamucil, Citrucel, FiberCon, MiraLax, etc) 2-3 times a day regularly will usually help prevent this problem from occurring.  A mild laxative (prune juice, Milk of Magnesia, MiraLax, etc) should be taken according to package directions if there are no bowel movements after 48 hours. c. Watch out for diarrhea.  If you have many loose bowel movements, simplify your diet to bland foods & liquids for a few days.  Stop any stool softeners and decrease your fiber supplement.  Switching to mild anti-diarrheal medications (Kayopectate, Pepto Bismol) can help.  Can try an imodium/loperamide dose.  If this worsens or does not improve, please call us.  5. Wound Care  a. Remove your bandages with your first bowel movement, usually the day after surgery.  You may have packing if you had an abscess.  Let any packing or gauze fall come out.   b. Wear an absorbent pad or soft cotton balls in your underwear as needed to catch any drainage and help keep the area  c. Keep the area clean and dry.  Bathe / shower every day.  Keep the area clean by showering / bathing over the incision / wound.   It is okay to soak an open wound to help wash it.  Consider using a squeeze bottle filled with warm water to gently wash the anal area.  Wet wipes or showers / gentle washing after bowel movements is often less traumatic than regular toilet paper. d. Dennis Bast will often notice bleeding with bowel movements.  This should slow down by the end of the first week of surgery.  Sitting on an ice pack can help. e. Expect some drainage.  This should slow down by the end of the first week of surgery, but you will have occasional bleeding or drainage up to a few months after surgery.  Wear an absorbent pad  or soft cotton gauze in your underwear until the drainage stops.  6. ACTIVITIES as tolerated:   a. You may resume regular (light) daily activities beginning the next day--such as daily self-care, walking, climbing stairs--gradually increasing activities as tolerated.  If you can walk 30 minutes without difficulty, it is safe to try more intense activity such as jogging, treadmill, bicycling, low-impact aerobics, swimming, etc. b. Save the most intensive and strenuous activity for last such as sit-ups, heavy lifting, contact sports, etc  Refrain from any heavy lifting or straining until you are off narcotics for pain control.   c. DO NOT PUSH THROUGH PAIN.  Let pain be your guide: If it hurts to do something, don't do it.  Pain is your body warning you to avoid that activity for another week until the pain goes down. d. You may drive when  you are no longer taking prescription pain medication, you can comfortably sit for long periods of time, and you can safely maneuver your car and apply brakes. e. Dennis Bast may have sexual intercourse when it is comfortable.  7. FOLLOW UP in our office a. Please call [facility] at [phone] to set up an appointment to see your surgeon in the office for a follow-up appointment approximately 2-3 weeks after your surgery. b. Make sure that you call for this appointment the day you arrive home to ensure a convenient appointment time.  8. IF YOU HAVE DISABILITY OR FAMILY LEAVE FORMS, BRING THEM TO THE OFFICE FOR PROCESSING.  DO NOT GIVE THEM TO YOUR DOCTOR.        WHEN TO CALL us [phone]: 1. Poor pain control 2. Reactions / problems with new medications (rash/itching, nausea, etc)  3. Fever over 101.5 F (38.5 C) 4. Inability to urinate 5. Nausea and/or vomiting 6. Worsening swelling or bruising 7. Continued bleeding from incision. 8. Increased pain, redness, or drainage from the incision  The clinic staff is available to answer your questions during regular  business hours (8:30am-5pm).  Please dont hesitate to call and ask to speak to one of our nurses for clinical concerns.   A surgeon from [facility] is always on call at the hospitals   If you have a medical emergency, go to the nearest emergency room or call 911.    [facility], [address] [place], [place], [place], [place]  [address] ? MAIN: [phone] ? TOLL FREE: [phone] ? FAX [phone] [email or web address]   Crohn Disease Crohn disease is a long-lasting (chronic) disease that affects your gastrointestinal (GI) tract. It often causes irritation and swelling (inflammation) in your small intestine and the beginning of your large intestine. However, it can affect any part of your GI tract. Crohn disease is part of a group of illnesses that are known as inflammatory bowel disease (IBD). Crohn disease may start slowly and get worse over time. Symptoms may come and go. They may also disappear for months or even years at a time (remission). What are the causes? The exact cause of Crohn disease is not known. It may be a response that causes your body's defense system (immune system) to mistakenly attack healthy cells and tissues (autoimmune response). Your genes and your environment may also play a role. What increases the risk? You may be at greater risk for Crohn disease if you:  Have other family members with Crohn disease or another IBD.  Use any tobacco products, including cigarettes, chewing tobacco, or electronic cigarettes.  Are in your 33s.  Have [place] European ancestry.  What are the signs or symptoms? The main signs and symptoms of Crohn disease involve your GI tract. These include:  Diarrhea.  Rectal bleeding.  An urgent need to move your bowels.  The feeling that you are not finished having a bowel movement.  Abdominal pain or cramping.  Constipation.  General signs and symptoms of Crohn disease may also  include:  Unexplained weight loss.  Fatigue.  Fever.  Nausea.  Loss of appetite.  Joint pain  Changes in vision.  Red bumps on your skin.  How is this diagnosed? Your health care provider may suspect Crohn disease based on your symptoms and your medical history. Your health care provider will do a physical exam. You may need to see a health care provider who specializes in diseases of the digestive tract (gastroenterologist). You  may also have tests to help your health care providers make a diagnosis. These may include:  Blood tests.  Stool sample tests.  Imaging tests, such as X-rays and CT scans.  Tests to examine the inside of your intestines using a long, flexible tube that has a light and a camera on the end (endoscopy or colonoscopy).  A procedure to take tissue samples from inside your bowel (biopsy) to be examined under a microscope.  How is this treated? There is no cure for Crohn disease. Treatment will focus on managing your symptoms. Crohn disease affects each person differently. Your treatment may include:  Resting your bowels. Drinking only clear liquids or getting nutrition through an IV for a period of time gives your bowels a chance to heal because they are not passing stools.  Medicines. These may be used alone or in combination (combination therapy). These may include antibiotic medicines. You may be given medicines that help to: ? Reduce inflammation. ? Control your immune system activity. ? Fight infections. ? Relieve cramps and prevent diarrhea. ? Control your pain.  Surgery. You may need surgery if: ? Medicines and other treatments are no longer working. ? You develop complications from severe Crohn disease. ? A section of your intestine becomes so damaged that it needs to be removed.  Follow these instructions at home:  Take medicines only as directed by your health care provider.  If you were prescribed an antibiotic medicine, finish it all  even if you start to feel better.  Keep all follow-up visits as directed by your health care provider. This is important.  Talk with your health care provider about changing your diet. This may help your symptoms. Your health care provide may recommend changes, such as: ? Drinking more fluids. ? Avoiding milk and other foods that contain lactose. ? Eating a low-fat diet. ? Avoiding high-fiber foods, such as popcorn and nuts. ? Avoiding carbonated beverages, such as soda. ? Eating smaller meals more often rather than eating large meals. ? Keeping a food diary to identify foods that make your symptoms better or worse.  Do not use any tobacco products, including cigarettes, chewing tobacco, or electronic cigarettes. If you need help quitting, ask your health care provider.  Limit alcohol intake to no more than 1 drink per day for nonpregnant women and 2 drinks per day for men. One drink equals 12 ounces of beer, 5 ounces of wine, or 1 ounces of hard liquor.  Exercise daily or as directed by your health care provider. Contact a health care provider if:  You have diarrhea, abdominal cramps, and other gastrointestinal problems that are present almost all of the time.  Your symptoms do not improve with treatment.  You continue to lose weight.  You develop a rash or sores on your skin.  You develop eye problems.  You have a fever.  Your symptoms get worse.  You develop new symptoms. Get help right away if:  You have bloody diarrhea.  You develop severe abdominal pain.  You cannot pass stools. This information is not intended to replace advice given to you by your health care provider. Make sure you discuss any questions you have with your health care provider. Document Released: 02/21/2005 Document Revised: 09/22/2015 Document Reviewed: 12/30/2013 Elsevier Interactive Patient Education  2018 [place] for Discharge Teaching: EXPAREL (bupivacaine liposome  injectable suspension)   Your surgeon gave you EXPAREL(bupivacaine) in your surgical incision to help control your pain after surgery.  EXPAREL is a local anesthetic that provides pain relief by numbing the tissue around the surgical site.  EXPAREL is designed to release pain medication over time and can control pain for up to 72 hours.  Depending on how you respond to EXPAREL, you may require less pain medication during your recovery.  Possible side effects:  Temporary loss of sensation or ability to move in the area where bupivacaine was injected.  Nausea, vomiting, constipation  Rarely, numbness and tingling in your mouth or lips, lightheadedness, or anxiety may occur.  Call your doctor right away if you think you may be experiencing any of these sensations, or if you have other questions regarding possible side effects.  Follow all other discharge instructions given to you by your surgeon or nurse. Eat a healthy diet and drink plenty of water or other fluids.  If you return to the hospital for any reason within 96 hours following the administration of EXPAREL, please inform your health care providers. Post Anesthesia Home Care Instructions  Activity: Get plenty of rest for the remainder of the day. A responsible individual must stay with you for 24 hours following the procedure.  For the next 24 hours, DO NOT: -Drive a car -Paediatric nurse -Drink alcoholic beverages -Take any medication unless instructed by your physician -Make any legal decisions or sign important papers.  Meals: Start with liquid foods such as gelatin or soup. Progress to regular foods as tolerated. Avoid greasy, spicy, heavy foods. If nausea and/or vomiting occur, drink only clear liquids until the nausea and/or vomiting subsides. Call your physician if vomiting continues.  Special Instructions/Symptoms: Your throat may feel dry or sore from the anesthesia or the breathing tube placed in your throat  during surgery. If this causes discomfort, gargle with warm salt water. The discomfort should disappear within 24 hours.

## 2017-10-11 NOTE — Anesthesia Preprocedure Evaluation (Addendum)
Anesthesia Evaluation  Patient identified by MRN, date of birth, ID band Patient awake    Reviewed: Allergy & Precautions, NPO status , Patient's Chart, lab work & pertinent test results  Airway Mallampati: II  TM Distance: >3 FB Neck ROM: Full    Dental no notable dental hx. (+) Teeth Intact, Dental Advisory Given,    Pulmonary neg pulmonary ROS,    Pulmonary exam normal breath sounds clear to auscultation       Cardiovascular hypertension, Pt. on home beta blockers Normal cardiovascular exam Rhythm:Regular Rate:Normal     Neuro/Psych negative neurological ROS  negative psych ROS   GI/Hepatic Neg liver ROS, GERD  Medicated and Controlled,  Endo/Other  negative endocrine ROS  Renal/GU negative Renal ROS     Musculoskeletal negative musculoskeletal ROS (+)   Abdominal   Peds  Hematology   Anesthesia Other Findings All: Latex, PCN  Reproductive/Obstetrics                           Anesthesia Physical Anesthesia Plan  ASA: II  Anesthesia Plan: General   Post-op Pain Management:    Induction: Intravenous  PONV Risk Score and Plan:   Airway Management Planned: Oral ETT  Additional Equipment:   Intra-op Plan:   Post-operative Plan: Extubation in OR  Informed Consent: I have reviewed the patients History and Physical, chart, labs and discussed the procedure including the risks, benefits and alternatives for the proposed anesthesia with the patient or authorized representative who has indicated his/her understanding and acceptance.   Dental advisory given  Plan Discussed with: CRNA  Anesthesia Plan Comments:         Anesthesia Quick Evaluation

## 2017-10-11 NOTE — Anesthesia Procedure Notes (Signed)
Procedure Name: Intubation Date/Time: 10/11/2017 2:26 PM Performed by: Wanita Chamberlain, CRNA Pre-anesthesia Checklist: Patient identified, Emergency Drugs available, Suction available, Patient being monitored and Timeout performed Patient Re-evaluated:Patient Re-evaluated prior to induction Oxygen Delivery Method: Circle system utilized Preoxygenation: Pre-oxygenation with 100% oxygen Induction Type: IV induction Ventilation: Mask ventilation without difficulty Laryngoscope Size: Mac and 3 Grade View: Grade I Tube type: Oral Tube size: 7.0 mm Number of attempts: 1 Placement Confirmation: breath sounds checked- equal and bilateral,  CO2 detector,  positive ETCO2 and ETT inserted through vocal cords under direct vision Secured at: 22 cm Tube secured with: Tape Dental Injury: Teeth and Oropharynx as per pre-operative assessment

## 2017-10-11 NOTE — Interval H&P Note (Signed)
History and Physical Interval Note:  10/11/2017 2:11 PM  Julie Clark  has presented today for surgery, with the diagnosis of [place]. HEMORRHOIDS.,  The various methods of treatment have been discussed with the patient and family. After consideration of risks, benefits and other options for treatment, the patient has consented to  Procedure(s): ANORECTAL EXAM UNDER ANESTHESIA (N/A) DRAINAGE OF PERIRECTAL ABSCESSES (N/A) PLACEMENT OF SETON (N/A) as a surgical intervention .  The patient's history has been reviewed, patient examined, no change in status, stable for surgery.  I have reviewed the patient's chart and labs.  Questions were answered to the patient's satisfaction.     [name]

## 2017-10-11 NOTE — H&P (Signed)
Julie Clark Documented: 09/03/2017 8:54 AM DOB: 02/06/1956 Married / Language: [name] / Race: Black or African American Female   Patient Care Team: Julie Lima, MD as PCP - General (Internal Medicine) Julie Haw, MD as PCP - Cardiology (Cardiology) Julie Boston, MD as Consulting Physician (General Surgery) Julie Banister, MD as Attending Physician (Gastroenterology)  ` ` Patient sent for surgical consultation at the request of Julie Clark, Julie Clark  Chief Complaint: Worsening hemorrhoids.  The patient is a pleasant woman. Works as a Marine scientist at [employer]. She's had hemorrhoids for many years. She has noted some tags in the outside. There been discussion of banding versus surgical referral. More recently felt to need surgical referral given the external components. She seemed to hold off. Unfortunately, more recently become more swollen and uncomfortable. She was placed on anticoagulation Eliquis. Noticed more regular rectal bleeding. She also noticed some lumps / cysts perianally that occasionally will pop/ rupture. Usually would move her bowels a couple times a week. Now they are more mushy and soft every day. Not on a fiber supplement. She's never had any prior anorectal interventions. No injection or banding. No thrombosed hemorrhoid lanced. No rectal abscesses needing to be lanced. She had C-sections but no other abdominal surgery. She is followed by Dr. Curt Clark with cardiology.  No personal nor family history of Clark/colon cancer, inflammatory bowel disease, irritable bowel syndrome, allergy such as Celiac Sprue, dietary/dairy problems, colitis, ulcers nor gastritis. No recent sick contacts/gastroenteritis. No travel outside the country. No changes in diet. No dysphagia to solids or liquids. No significant heartburn or reflux. No hematochezia, hematemesis, coffee ground emesis. No evidence of prior gastric/peptic ulceration.  She recalls having abscesses on her tailbone lanced. The last one almost a decade ago. She thinks it was in our office but cannot remember for certain.  I saw her in clinic.  Had draining sinuses suspicious for fistula a MRI confirmed Crohn's was going to start on full immunosuppression.  However had worsening pain and swelling on prednisone.  Recommendation made for examination under anesthesia with drainage of abscesses and probable seton placement so she can transition to full immunosuppression in the hopes of controlling her perianal Crohn's disease before even considering attempting to operate on her hemorrhoids  (Review of systems as stated in this history (HPI) or in the review of systems. Otherwise all other 12 point ROS are negative) ` ` `   Past Surgical History (Julie Clark, Julie Clark; 09/03/2017 8:55 AM) Cesarean Section - Multiple  Oral Surgery   Diagnostic Studies History (Julie Clark, Julie Clark; 09/03/2017 8:55 AM) Colonoscopy  1-5 years ago Mammogram  within last year Pap Smear  1-5 years ago  Allergies (Julie Clark, Julie Clark; 09/03/2017 8:56 AM) Penicillins  Allergies Reconciled   Medication History (Julie Clark, Julie Clark; 09/03/2017 8:56 AM) Geronimo Boot XT (120MG Capsule ER 24HR, Oral) Active. Eliquis (5MG Tablet, Oral) Active. Metoprolol Succinate ER (25MG Tablet ER 24HR, Oral) Active. Ibuprofen (800MG Tablet, Oral) Active. Medications Reconciled  Social History (Julie Clark, Julie Clark; 09/03/2017 8:55 AM) Caffeine use  Coffee. No alcohol use  No drug use  Tobacco use  Never smoker.  Family History (Julie Clark, Julie Clark; 09/03/2017 8:55 AM) Cerebrovascular Accident  Mother. Hypertension  Father, Mother. Prostate Cancer  Father.  Pregnancy / Birth History (Julie Clark, Julie Clark; 09/03/2017 8:55 AM) Age at menarche  13 years. Age of menopause  [age] Contraceptive History  Oral contraceptives. Gravida  2 Maternal  age  31-30 Para  2  Other  Problems (Julie Clark, Julie Clark; 09/03/2017 8:55 AM) Atrial Fibrillation  Heart murmur  Hemorrhoids  High blood pressure     Review of Systems (Julie A. Julie Clark; 09/03/2017 8:55 AM) General Not Present- Appetite Loss, Chills, Fatigue, Fever, Night Sweats, Weight Gain and Weight Loss. Skin Not Present- Change in Wart/Mole, Dryness, Hives, Jaundice, New Lesions, Non-Healing Wounds, Rash and Ulcer. HEENT Present- Wears glasses/contact lenses. Not Present- Earache, Hearing Loss, Hoarseness, Nose Bleed, Oral Ulcers, Ringing in the Ears, Seasonal Allergies, Sinus Pain, Sore Throat, Visual Disturbances and Yellow Eyes. Respiratory Not Present- Bloody sputum, Chronic Cough, Difficulty Breathing, Snoring and Wheezing. Breast Not Present- Breast Mass, Breast Pain, Nipple Discharge and Skin Changes. Gastrointestinal Present- Bloody Stool, Change in Bowel Habits and Hemorrhoids. Not Present- Abdominal Pain, Bloating, Chronic diarrhea, Constipation, Difficulty Swallowing, Excessive gas, Gets full quickly at meals, Indigestion, Nausea, Rectal Pain and Vomiting. Female Genitourinary Not Present- Frequency, Nocturia, Painful Urination, Pelvic Pain and Urgency. Musculoskeletal Not Present- Back Pain, Joint Pain, Joint Stiffness, Muscle Pain, Muscle Weakness and Swelling of Extremities. Neurological Not Present- Decreased Memory, Fainting, Headaches, Numbness, Seizures, Tingling, Tremor, Trouble walking and Weakness. Psychiatric Not Present- Anxiety, Bipolar, Change in Sleep Pattern, Depression, Fearful and Frequent crying. Endocrine Not Present- Cold Intolerance, Excessive Hunger, Hair Changes, Heat Intolerance, Hot flashes and New Diabetes. Hematology Present- Blood Thinners. Not Present- Easy Bruising, Excessive bleeding, Gland problems, HIV and Persistent Infections.  Vitals (Julie A. Julie Clark; 09/03/2017 8:56 AM) 09/03/2017 8:55 AM Weight: 152.6 lb Height: 64in Body Surface Area: 1.74 m Body  Mass Index: 26.19 kg/m  Temp.: 97.72F  Pulse: 98 (Regular)  BP: 136/72 (Sitting, Left Arm, Standard)       Physical Exam Julie Hector MD; 09/03/2017 9:32 AM) General Mental Status-Alert. General Appearance-Not in acute distress, Not Sickly. Orientation-Oriented X3. Hydration-Well hydrated. Voice-Normal.  Integumentary Global Assessment Upon inspection and palpation of skin surfaces of the - Axillae: non-tender, no inflammation or ulceration, no drainage. and Distribution of scalp and body hair is normal. General Characteristics Temperature - normal warmth is noted.  Head and Neck Head-normocephalic, atraumatic with no lesions or palpable masses. Face Global Assessment - atraumatic, no absence of expression. Neck Global Assessment - no abnormal movements, no bruit auscultated on the right, no bruit auscultated on the left, no decreased range of motion, non-tender. Trachea-midline. Thyroid Gland Characteristics - non-tender.  Eye Eyeball - Left-Extraocular movements intact, No Nystagmus. Eyeball - Right-Extraocular movements intact, No Nystagmus. Cornea - Left-No Hazy. Cornea - Right-No Hazy. Sclera/Conjunctiva - Left-No scleral icterus, No Discharge. Sclera/Conjunctiva - Right-No scleral icterus, No Discharge. Pupil - Left-Direct reaction to light normal. Pupil - Right-Direct reaction to light normal.  ENMT Ears Pinna - Left - no drainage observed, no generalized tenderness observed. Right - no drainage observed, no generalized tenderness observed. Nose and Sinuses External Inspection of the Nose - no destructive lesion observed. Inspection of the nares - Left - quiet respiration. Right - quiet respiration. Mouth and Throat Lips - Upper Lip - no fissures observed, no pallor noted. Lower Lip - no fissures observed, no pallor noted. Nasopharynx - no discharge present. Oral Cavity/Oropharynx - Tongue - no dryness observed. Oral  Mucosa - no cyanosis observed. Hypopharynx - no evidence of airway distress observed.  Chest and Lung Exam Inspection Movements - Normal and Symmetrical. Accessory muscles - No use of accessory muscles in breathing. Palpation Palpation of the chest reveals - Non-tender. Auscultation Breath sounds - Normal and  Clear.  Cardiovascular Auscultation Rhythm - Regular. Murmurs & Other Heart Sounds - Auscultation of the heart reveals - No Murmurs and No Systolic Clicks.  Abdomen Inspection Inspection of the abdomen reveals - No Visible peristalsis and No Abnormal pulsations. Umbilicus - No Bleeding, No Urine drainage. Palpation/Percussion Palpation and Percussion of the abdomen reveal - Soft, Non Tender, No Rebound tenderness, No Rigidity (guarding) and No Cutaneous hyperesthesia. Note: Abdomen soft. Not severely distended. No distasis recti. No umbilical or other anterior abdominal wall hernias   Female Genitourinary Sexual Maturity Tanner 5 - Adult hair pattern. Note: No vaginal bleeding nor discharge. No inguinal lymphadenopathy. Suprapubic Pfannenstiel incision well-healed. No sores or lesions or hydradenitis in the groins or perineum   Rectal Note: 3 pile prolapsed hemorrhoids with inflammation. Grade 4. No thrombus or necrosis. Very sensitive. About 4 red dots consistent with some superficial sinuses/peritonitis/fistula. No definite purulence expressed. Within 3 cm of the anus. Some pruritus. Sphincter tone intact mildly increased.  He tolerates finger examination. No obvious rectal masses are fissure. No abscess. No condyloma. Off on anoscopy given the sensitivity. Vertical scarring left intergluteal cleft. Suspicious for prior incision and drainage of pilonidal abscess. No chronic pits or sinus. No evidence of active pilonidal disease at this time.   Peripheral Vascular Upper Extremity Inspection - Left - No Cyanotic nailbeds, Not Ischemic. Right - No Cyanotic nailbeds, Not  Ischemic.  Neurologic Neurologic evaluation reveals -normal attention span and ability to concentrate, able to name objects and repeat phrases. Appropriate fund of knowledge , normal sensation and normal coordination. Mental Status Affect - not angry, not paranoid. Cranial Nerves-Normal Bilaterally. Gait-Normal.  Neuropsychiatric Mental status exam performed with findings of-able to articulate well with normal speech/language, rate, volume and coherence, thought content normal with ability to perform basic computations and apply abstract reasoning and no evidence of hallucinations, delusions, obsessions or homicidal/suicidal ideation.  Musculoskeletal Global Assessment Spine, Ribs and Pelvis - no instability, subluxation or laxity. Right Upper Extremity - no instability, subluxation or laxity.  Lymphatic Head & Neck  General Head & Neck Lymphatics: Bilateral - Description - No Localized lymphadenopathy. Axillary  General Axillary Region: Bilateral - Description - No Localized lymphadenopathy. Femoral & Inguinal  Generalized Femoral & Inguinal Lymphatics: Left - Description - No Localized lymphadenopathy. Right - Description - No Localized lymphadenopathy.    Assessment & Plan   Cardiac clearance done to make sure she can come off Eliquis.  Perianal Crohn's disease with fistulae and abscess.  Think she would benefit from examination under anesthesia with drainage of any abscesses and rubber band seton ring to help control draining sinuses.  Start immunosuppression in the hopes that we can close the fistula a without fistulotomy or more aggressive surgery.  The anatomy & physiology of the anorectal region was discussed.  We discussed the pathophysiology of anorectal abscess and fistula.  Differential diagnosis was discussed.  Natural history progression was discussed.   I stressed the importance of a bowel regimen to have daily soft bowel movements to minimize progression of  disease.     The patient's condition is not adequately controlled.  Non-operative treatment has not healed the fistula.  Therefore, I recommended examination under anaesthesia to confirm the diagnosis and treat the fistula.  I discussed techniques that may be required such as fistulotomy, ligation by LIFT technique, and/or seton placement.  Benefits & alternatives discussed.  I noted a good likelihood this will help address the problem, but sometimes repeat operations and prolonged healing times may  occur.  Risks such as bleeding, pain, recurrence, reoperation, injury to other organs, need for repair of tissues / organs reoperation, incontinence, heart attack, death, and other risks were discussed.       PROLAPSED INTERNAL HEMORRHOIDS, GRADE 4 (K64.3) Impression: 3 pile chronically prolapsed hemorrhoids with inflammation and irritation.  I recommend warm soaks and fiber bowel regimen to minimize irritation. Try to calm the inflammation down.  These are too far along to do banding. She is too sensitive and barely can tolerate digital rectal exam. At some point she would benefit from surgical ligation/pexy hemorrhoidectomy. Would like this flare to calm down a little bit.  She needs to get her perianal Crohn's disease under control first.  She seems rather miserable & is hoping I could do something as soon as possible. May try and do some surgery but hold off on anything too aggressive.   PILONIDAL CYST WITHOUT INFECTION (L05.91) Impression: Prior scarring and upper intergluteal cleft consistent with prior incision and drainage for pilonidal disease. No definite evidence of recurrence at this time.  We'll try and get records from prior incision and drainage done about a decade ago.  Current Plans The anatomy of the gluteal and sacral region was discussed. Pathophysiology of pilonidal disease due to ingrown hairs was discussed. Importance of good hygiene discussed. Importance of keeping hairs  trimmed in the intergluteal crease from anus to top of sacrum/tailbone spine noted. Need for good hygiene stressed. Use of electric razor or nose hair trimmers to keep the hairs trimmed discussed.  Risk of worsening progression needing surgical drainage of abscess or perhaps excision of chronic pilonidal disease with skin flap closure over drains discussed. Possible need for her to leave it open with packing to allow the wound close over several weeks/months discussed. Risks benefits alternatives to surgery discussed as well. Risk of recurrent disease discussed. Patient was expressed understanding. Literature given to patient. We will see if we can control this without an operation first.  Pt Education - [facility] Pilonidal Disease (AT)  Julie Clark, M.D., F.A.C.S. Gastrointestinal and Minimally Invasive Surgery [facility] [address], [place], [place] [address] [phone] Main / Paging

## 2017-10-14 ENCOUNTER — Encounter (HOSPITAL_BASED_OUTPATIENT_CLINIC_OR_DEPARTMENT_OTHER): Payer: Self-pay | Admitting: Surgery

## 2017-10-16 ENCOUNTER — Telehealth: Payer: Self-pay

## 2017-10-16 MED FILL — traMADol HCL 50 MG TABS: 50 | 3 days supply | Qty: 30 | Fill #0

## 2017-10-16 NOTE — Telephone Encounter (Signed)
Dr Ardis Hughs we got the approval for Julie Clark to have humira.  Do you want her to go ahead and start now?

## 2017-10-16 NOTE — Telephone Encounter (Signed)
4/28 12 noon appt with Dr Ardis Hughs, pt aware

## 2017-10-16 NOTE — Telephone Encounter (Signed)
I spoke with her on the phone this afternoon.  She is still having quite a lot of postoperative pain after her abscess I&D, seton placement, hemorrhoidectomy.  The oxycodone is giving her some nausea and dizziness.  I encouraged her to take Tylenol on a twice daily basis.  She is getting a prescription of tramadol from Dr. Clyda Greener office.  She will continue with oxycodone but at lower dose.  Sits baths once or twice daily at least.  [name], I like to see her for a quick office appointment next Tuesday if we can book her for around noon?  Depending on how she is doing at that point and how her exam looks I will consider starting the Humira and azathioprine.  Can you confirm with her time of her next Tuesday appointment in the office.  Thanks

## 2017-10-22 ENCOUNTER — Ambulatory Visit (INDEPENDENT_AMBULATORY_CARE_PROVIDER_SITE_OTHER): Payer: 59 | Admitting: Gastroenterology

## 2017-10-22 ENCOUNTER — Other Ambulatory Visit: Payer: Self-pay | Admitting: Internal Medicine

## 2017-10-22 ENCOUNTER — Encounter: Payer: Self-pay | Admitting: Gastroenterology

## 2017-10-22 ENCOUNTER — Other Ambulatory Visit: Payer: Self-pay | Admitting: Gastroenterology

## 2017-10-22 ENCOUNTER — Other Ambulatory Visit: Payer: Self-pay

## 2017-10-22 VITALS — BP 108/52 | HR 50 | Ht 64.0 in | Wt 148.8 lb

## 2017-10-22 DIAGNOSIS — K50119 Crohn's disease of large intestine with unspecified complications: Secondary | ICD-10-CM | POA: Diagnosis not present

## 2017-10-22 DIAGNOSIS — K50114 Crohn's disease of large intestine with abscess: Secondary | ICD-10-CM | POA: Diagnosis not present

## 2017-10-22 MED ORDER — ADALIMUMAB 40 MG/0.4ML ~~LOC~~ AJKT: 40.0000 mg | AUTO-INJECTOR | SUBCUTANEOUS | 3 refills | Status: DC

## 2017-10-22 MED ORDER — ADALIMUMAB 80 MG/0.8ML ~~LOC~~ AJKT: 80.0000 mg | AUTO-INJECTOR | SUBCUTANEOUS | 3 refills | Status: DC

## 2017-10-22 MED ORDER — AZATHIOPRINE 100 MG PO TABS: 100.0000 mg | ORAL_TABLET | Freq: Every day | ORAL | 11 refills | Status: DC

## 2017-10-22 MED ORDER — ADALIMUMAB 80 MG/0.8ML ~~LOC~~ AJKT: 2.0000 "pen " | AUTO-INJECTOR | Freq: Once | SUBCUTANEOUS | 0 refills | Status: DC

## 2017-10-22 MED ORDER — AZATHIOPRINE 100 MG PO TABS: 100.0000 mg | ORAL_TABLET | Freq: Every day | ORAL | 6 refills | Status: DC

## 2017-10-22 MED FILL — azaTHIOprine 50 MG TABS: 50 | 30 days supply | Qty: 60 | Fill #0

## 2017-10-22 NOTE — Patient Instructions (Addendum)
Azathioprine start, 100 mg once daily.  Dispense 1 month with 11 refills.  Humira start with usual regimen: 160 mg day 1, 80 mg 2 weeks later, 40 mg 2 weeks later and then 40 mg every 2 weeks  Return office visit in 6 weeks  CBC, complete metabolic profile in 4 weeks  Normal BMI (Body Mass Index- based on height and weight) is between 19 and 25. Your BMI today is Body mass index is 25.54 kg/m. Marland Kitchen Please consider follow up  regarding your BMI with your Primary Care Provider.

## 2017-10-22 NOTE — Progress Notes (Signed)
Review of pertinent gastrointestinal problems: 1. Crohn's disease: Crohn's colitis with severe perianal disease.  No evidence of small bowel disease on 09/2017 CT enterography.  Colonoscopy 09/2017 showed normal terminal ileum, patchy deep ulcers segmentally throughout her colon, severe perianal disease with fistulas and early abscess that was treated by oral Abx, debridement, seton placement 09/2017, Dr. Johney Maine.  Colonoscopy 12/2012 for average risk screening; incidental inflammation right colon (path suggested possible IBD) was attributed to NSAID use at the time.  Labs 09/2017: TB quant gold negative, Hepatitis B Surface Ab negative, Surface Ag negative, Hep C Ab negative; TPMT activity level NORMAL.  Dual therapy with immunomodulators (azathiaprine) and biologic (humira) started 09/2017   HPI: This is a very pleasant 62 year old woman whom I officially last saw the time of her colonoscopy.  See those results above.  Her perianal disease worsened before we can get her started on Biologics, immunomodulators and unfortunately she had a early abscess development.  Dr. gross performed abscess debridement, seton placement about 10 days ago now.  She has had a lot of anal pain but it seems to be improving, albeit slowly.  No fevers or chills.  She still has loose stools.  Chief complaint is Crohn's colitis, severe perianal disease  ROS: complete GI ROS as described in HPI, all other review negative.  Constitutional:  No unintentional weight loss   Past Medical History:  Diagnosis Date  . Anal fistula   . Anticoagulant long-term use    eliquis  . Crohn's disease of perianal region [facility])    new dx 05/ 2019 by dr Ardis Hughs ([facility])  . Heart murmur   . Hemorrhoids    internal and external  . History of hidradenitis suppurativa    axilla  . Hyperlipidemia   . Hypertension   . PAF (paroxysmal atrial fibrillation) [facility])    cardiologist-  dr Curt Bears--  first dx 07-18-2015  . Perianal Crohn's  disease, with abscess ([place]) 10/08/2017  . Rectal pain   . Uterine fibroid    per MRI 09-16-2017  . Wears glasses     Past Surgical History:  Procedure Laterality Date  . CESAREAN SECTION  x2  last one 1996  . COLONOSCOPY WITH PROPOFOL  last one 09-27-2017  dr d. Ardis Hughs @ [facility]  . INCISION AND DRAINAGE ABSCESS N/A 10/11/2017   Procedure: DRAINAGE OF PERIRECTAL ABSCESSES;  Surgeon: Michael Boston, MD;  Location: [facility];  Service: General;  Laterality: N/A;  . [place] N/A 10/11/2017   Procedure: PLACEMENT OF SETON;  Surgeon: Michael Boston, MD;  Location: [place];  Service: General;  Laterality: N/A;  . RECTAL EXAM UNDER ANESTHESIA N/A 10/11/2017   Procedure: ANORECTAL EXAM UNDER ANESTHESIA;  Surgeon: Michael Boston, MD;  Location: [place];  Service: General;  Laterality: N/A;  . TRANSTHORACIC ECHOCARDIOGRAM  10/08/2012   ef 50-55%,  grade 1 diastolic dysfunction/  trivial MR and TR/  mild LAE    Current Outpatient Medications  Medication Sig Dispense Refill  . Adalimumab (HUMIRA PEN-CD/UC/HS STARTER) 80 MG/0.8ML PNKT Inject 80 mg of amoxicillin into the skin as directed. (Two 80 mg injections)160 mg day 1, 80 mg day 15, then 40 mg every other week. 3 each 3  . clindamycin (CLINDAGEL) 1 % gel Apply topically 2 (two) times daily. (Patient taking differently: Apply 1 application topically 2 (two) times daily as needed. ) 60 g 3  . ELIQUIS 5 MG TABS tablet TAKE 1 TABLET BY MOUTH  TWICE DAILY 60 tablet 6  . flecainide (TAMBOCOR) 150 MG tablet Take 2 tablets (300 mg total) by mouth daily as needed (for Atrial Fibrillation). (Patient taking differently: Take 300 mg by mouth daily as needed (for Atrial Fibrillation---  (10-10-2017) per pt stated knows to take when she gets extreme fatique and checks pulse for increased heart rate). ) 10 tablet 1  . metoprolol succinate (TOPROL-XL) 25 MG 24 hr tablet Take 1 tablet (25 mg total) by mouth  2 (two) times daily. (Patient taking differently: Take 25 mg by mouth 2 (two) times daily. ) 180 tablet 3  . metroNIDAZOLE (FLAGYL) 500 MG tablet Take 1 tablet (500 mg total) by mouth 2 (two) times daily. 20 tablet 0  . oxyCODONE (OXY IR/ROXICODONE) 5 MG immediate release tablet Take 1-2 tablets (5-10 mg total) by mouth every 6 (six) hours as needed for moderate pain, severe pain or breakthrough pain. 30 tablet 0  . Polyethylene Glycol 3350 (MIRALAX PO) Take by mouth daily.    . pramoxine-hydrocortisone (ANALPRAM HC) cream Apply topically 3 (three) times daily. (Patient taking differently: Apply topically 3 (three) times daily as needed. ) 30 g 1  . predniSONE (DELTASONE) 10 MG tablet Take 3 tablets (30 mg total) by mouth daily with breakfast. (Patient taking differently: Take 20 mg by mouth daily with breakfast. Per pt was given instructions from dr Ardis Hughs to taper off prednisone) 100 tablet 1  . diltiazem (CARDIZEM CD) 120 MG 24 hr capsule Take 1 capsule (120 mg total) by mouth daily. (Patient taking differently: Take 120 mg by mouth every morning. ) 30 capsule 11   No current facility-administered medications for this visit.    Facility-Administered Medications Ordered in Other Visits  Medication Dose Route Frequency Provider Last Rate Last Dose  . Chlorhexidine Gluconate Cloth 2 % PADS 6 each  6 each Topical Once Michael Boston, MD       And  . Chlorhexidine Gluconate Cloth 2 % PADS 6 each  6 each Topical Once Michael Boston, MD        Allergies as of 10/22/2017 - Review Complete 10/22/2017  Allergen Reaction Noted  . Latex Rash 10/10/2017  . Lovastatin  08/11/2012  . Protonix [pantoprazole] Diarrhea 10/10/2017  . Adhesive [tape] Rash 10/10/2017  . Penicillins Rash 08/11/2012    Family History  Problem Relation Age of Onset  . Stroke Mother   . Hypertension Mother   . Hyperlipidemia Mother   . Thyroid disease Mother   . Cancer Father        prostate  . Colon cancer Maternal Aunt    . Diabetes Neg Hx   . Depression Neg Hx   . Early death Neg Hx   . Hearing loss Neg Hx   . Heart disease Neg Hx   . Kidney disease Neg Hx   . Alcohol abuse Neg Hx   . Breast cancer Neg Hx   . Esophageal cancer Neg Hx   . Stomach cancer Neg Hx   . Rectal cancer Neg Hx     Social History   Socioeconomic History  . Marital status: Married    Spouse name: Not on file  . Number of children: Not on file  . Years of education: Not on file  . Highest education level: Not on file  Occupational History  . Occupation: registered Optician, dispensing: [employer]  . Financial resource strain: Not on file  . Food insecurity:    Worry:  Not on file    Inability: Not on file  . Transportation needs:    Medical: Not on file    Non-medical: Not on file  Tobacco Use  . Smoking status: Never Smoker  . Smokeless tobacco: Never Used  Substance and Sexual Activity  . Alcohol use: No  . Drug use: No  . Sexual activity: Not on file  Lifestyle  . Physical activity:    Days per week: Not on file    Minutes per session: Not on file  . Stress: Not on file  Relationships  . Social connections:    Talks on phone: Not on file    Gets together: Not on file    Attends religious service: Not on file    Active member of club or organization: Not on file    Attends meetings of clubs or organizations: Not on file    Relationship status: Not on file  . Intimate partner violence:    Fear of current or ex partner: Not on file    Emotionally abused: Not on file    Physically abused: Not on file    Forced sexual activity: Not on file  Other Topics Concern  . Not on file  Social History Narrative  . Not on file     Physical Exam: BP (!) 108/52   Pulse (!) 50   Ht 5' 4"  (1.626 m)   Wt 148 lb 12.8 oz (67.5 kg)   BMI 25.54 kg/m  Constitutional: generally well-appearing Psychiatric: alert and oriented x3 Abdomen: soft, nontender, nondistended, no obvious ascites, no peritoneal  signs, normal bowel sounds No peripheral edema noted in lower extremities  rectal exam with female assistant in the room: Seton in place, persistent medium sized hemorrhoid externally, 3 areas of wound perianally these all seem to be healing nicely with granulation tissue at the base.  Assessment and plan: 62 y.o. female with Crohn's colitis and perianal disease  She is going to  start azathioprine now at 100 mg once daily.  She has a meeting with pharmacy next week and will plan to start Humira next week at usual regimen, see patient instructions.  She will return to the office officially in 6 weeks.  CBC and complete med about profile in 4 weeks  Please see the "Patient Instructions" section for addition details about the plan.  Owens Loffler, MD [facility] 10/22/2017, 12:24 PM

## 2017-11-01 ENCOUNTER — Telehealth: Payer: Self-pay | Admitting: Gastroenterology

## 2017-11-01 ENCOUNTER — Other Ambulatory Visit: Payer: Self-pay | Admitting: Gastroenterology

## 2017-11-01 DIAGNOSIS — K50114 Crohn's disease of large intestine with abscess: Secondary | ICD-10-CM

## 2017-11-01 MED ORDER — CIPROFLOXACIN HCL 500 MG PO TABS: 500.0000 mg | ORAL_TABLET | Freq: Two times a day (BID) | ORAL | 0 refills | Status: DC

## 2017-11-01 MED ORDER — METRONIDAZOLE 500 MG PO TABS: 500.0000 mg | ORAL_TABLET | Freq: Two times a day (BID) | ORAL | 0 refills | Status: DC

## 2017-11-01 NOTE — Progress Notes (Signed)
Called in pneumovax 13 to her [facility]

## 2017-11-01 NOTE — Telephone Encounter (Signed)
Julie Clark and I spoke today.  Feeling overall better but the site at her anus next to the seton hurts a bit more.  The rest of her backside continues to improve.  No fevers, no flu like symtpoms or serious abd pains since starting azathiaprine.  I will contact Dr. Johney Maine about seeing her sooner than her next Friday appt to make sure all is healing appropriately. She'll also start cipro 500, flagyl 500 both bid for 10 day empirically.  Goal is starting anti-tnf mid next week after her appt with clin pharm/ Dr. Doreene Burke.   She was immunized for shingles last year.    Will take prevnar 13 tomorrow (I just called it  in to [facility], they administer the shot there.  She will need prevar 23 to be given in 8-10 weeks, not sooner.   She needs HIV and varicella antibodies checked this Monday, can you put those orders in and remind her Monday AM about the lab draw.

## 2017-11-03 MED FILL — HUMIRA PEN-CD/UC/HS STARTER: 80 | 15 days supply | Qty: 3 | Fill #0

## 2017-11-04 ENCOUNTER — Other Ambulatory Visit: Payer: Self-pay

## 2017-11-04 ENCOUNTER — Other Ambulatory Visit (INDEPENDENT_AMBULATORY_CARE_PROVIDER_SITE_OTHER): Payer: 59

## 2017-11-04 DIAGNOSIS — K50114 Crohn's disease of large intestine with abscess: Secondary | ICD-10-CM | POA: Diagnosis not present

## 2017-11-04 DIAGNOSIS — K50119 Crohn's disease of large intestine with unspecified complications: Secondary | ICD-10-CM

## 2017-11-04 LAB — CBC WITH DIFFERENTIAL/PLATELET
BASOS PCT: 0.5 % (ref 0.0–3.0)
Basophils Absolute: 0 10*3/uL (ref 0.0–0.1)
EOS ABS: 0.3 10*3/uL (ref 0.0–0.7)
Eosinophils Relative: 2.4 % (ref 0.0–5.0)
HCT: 30.4 % — ABNORMAL LOW (ref 36.0–46.0)
Hemoglobin: 9.7 g/dL — ABNORMAL LOW (ref 12.0–15.0)
LYMPHS ABS: 0.9 10*3/uL (ref 0.7–4.0)
Lymphocytes Relative: 8.6 % — ABNORMAL LOW (ref 12.0–46.0)
MCHC: 31.8 g/dL (ref 30.0–36.0)
MCV: 80.2 fl (ref 78.0–100.0)
MONO ABS: 0.6 10*3/uL (ref 0.1–1.0)
Monocytes Relative: 5.2 % (ref 3.0–12.0)
NEUTROS ABS: 8.9 10*3/uL — AB (ref 1.4–7.7)
Neutrophils Relative %: 83.3 % — ABNORMAL HIGH (ref 43.0–77.0)
PLATELETS: 732 10*3/uL — AB (ref 150.0–400.0)
RBC: 3.79 Mil/uL — ABNORMAL LOW (ref 3.87–5.11)
RDW: 18.4 % — AB (ref 11.5–15.5)
WBC: 10.7 10*3/uL — ABNORMAL HIGH (ref 4.0–10.5)

## 2017-11-04 LAB — COMPREHENSIVE METABOLIC PANEL
ALT: 7 U/L (ref 0–35)
AST: 12 U/L (ref 0–37)
Albumin: 3.6 g/dL (ref 3.5–5.2)
Alkaline Phosphatase: 71 U/L (ref 39–117)
BUN: 10 mg/dL (ref 6–23)
CHLORIDE: 103 meq/L (ref 96–112)
CO2: 26 meq/L (ref 19–32)
CREATININE: 0.71 mg/dL (ref 0.40–1.20)
Calcium: 9.1 mg/dL (ref 8.4–10.5)
GFR: 107.4 mL/min (ref >60.00)
Glucose, Bld: 115 mg/dL — ABNORMAL HIGH (ref 70–99)
Potassium: 3.8 mEq/L (ref 3.5–5.1)
SODIUM: 140 meq/L (ref 135–145)
Total Bilirubin: 0.3 mg/dL (ref 0.2–1.2)
Total Protein: 7 g/dL (ref 6.0–8.3)

## 2017-11-04 NOTE — Telephone Encounter (Signed)
The pt has been advised and will have labs today.  Pneumo 23 is in the sytem and appt made for 8-10 weeks out.

## 2017-11-05 ENCOUNTER — Encounter: Payer: Self-pay | Admitting: Internal Medicine

## 2017-11-05 ENCOUNTER — Ambulatory Visit: Payer: 59 | Admitting: Internal Medicine

## 2017-11-05 VITALS — BP 130/74 | HR 80 | Ht 64.0 in

## 2017-11-05 DIAGNOSIS — Z79899 Other long term (current) drug therapy: Secondary | ICD-10-CM

## 2017-11-05 LAB — VARICELLA ZOSTER ABS, IGG/IGM
Varicella IgM: <0.91 index (ref 0.00–0.90)
Varicella zoster IgG: 2358 index (ref >165)

## 2017-11-05 LAB — HIV ANTIBODY (ROUTINE TESTING W REFLEX): HIV: NONREACTIVE

## 2017-11-05 MED ORDER — ADALIMUMAB 40 MG/0.4ML ~~LOC~~ AJKT: 40.0000 mg | AUTO-INJECTOR | SUBCUTANEOUS | 3 refills | Status: DC

## 2017-11-05 MED ORDER — ADALIMUMAB 80 MG/0.8ML ~~LOC~~ AJKT: 2.0000 "pen " | AUTO-INJECTOR | Freq: Once | SUBCUTANEOUS | 0 refills | Status: DC

## 2017-11-05 MED FILL — DILTIAZEM 24HR CD 120 MG CA: 120 | 30 days supply | Qty: 30 | Fill #6

## 2017-11-05 MED FILL — HYDROCODON-APAP 7.5-325: 7.5-325 | 5 days supply | Qty: 40 | Fill #0

## 2017-11-05 MED FILL — ELIQUIS 5 MG TABLET: 5 | 30 days supply | Qty: 60 | Fill #2

## 2017-11-05 NOTE — Patient Instructions (Signed)
Adalimumab Injection What is this medicine? ADALIMUMAB (a dal AYE mu mab) is used to treat rheumatoid and psoriatic arthritis. It is also used to treat ankylosing spondylitis, Crohn's disease, ulcerative colitis, plaque psoriasis, hidradenitis suppurativa, and uveitis. This medicine may be used for other purposes; ask your health care provider or pharmacist if you have questions. COMMON BRAND NAME(S): CYLTEZO, Humira What should I tell my health care provider before I take this medicine? They need to know if you have any of these conditions: -diabetes -heart disease -hepatitis B or history of hepatitis B infection -immune system problems -infection or history of infections -multiple sclerosis -recently received or scheduled to receive a vaccine -scheduled to have surgery -tuberculosis, a positive skin test for tuberculosis or have recently been in close contact with someone who has tuberculosis -an unusual reaction to adalimumab, other medicines, mannitol, latex, rubber, foods, dyes, or preservatives -pregnant or trying to get pregnant -breast-feeding How should I use this medicine? This medicine is for injection under the skin. You will be taught how to prepare and give this medicine. Use exactly as directed. Take your medicine at regular intervals. Do not take your medicine more often than directed. A special MedGuide will be given to you by the pharmacist with each prescription and refill. Be sure to read this information carefully each time. It is important that you put your used needles and syringes in a special sharps container. Do not put them in a trash can. If you do not have a sharps container, call your pharmacist or healthcare provider to get one. Talk to your pediatrician regarding the use of this medicine in children. While this drug may be prescribed for children as young as 2 years for selected conditions, precautions do apply. The manufacturer of the medicine offers free  information to patients and their health care partners. Call [phone] for more information. Overdosage: If you think you have taken too much of this medicine contact a poison control center or emergency room at once. NOTE: This medicine is only for you. Do not share this medicine with others. What if I miss a dose? If you miss a dose, take it as soon as you can. If it is almost time for your next dose, take only that dose. Do not take double or extra doses. Give the next dose when your next scheduled dose is due. Call your doctor or health care professional if you are not sure how to handle a missed dose. What may interact with this medicine? Do not take this medicine with any of the following medications: -abatacept -anakinra -etanercept -infliximab -live virus vaccines -rilonacept This medicine may also interact with the following medications: -vaccines This list may not describe all possible interactions. Give your health care provider a list of all the medicines, herbs, non-prescription drugs, or dietary supplements you use. Also tell them if you smoke, drink alcohol, or use illegal drugs. Some items may interact with your medicine. What should I watch for while using this medicine? Visit your doctor or health care professional for regular checks on your progress. Tell your doctor or healthcare professional if your symptoms do not start to get better or if they get worse. You will be tested for tuberculosis (TB) before you start this medicine. If your doctor prescribes any medicine for TB, you should start taking the TB medicine before starting this medicine. Make sure to finish the full course of TB medicine. Call your doctor or health care professional if you get a cold  or other infection while receiving this medicine. Do not treat yourself. This medicine may decrease your body's ability to fight infection. Talk to your doctor about your risk of cancer. You may be more at risk for  certain types of cancers if you take this medicine. What side effects may I notice from receiving this medicine? Side effects that you should report to your doctor or health care professional as soon as possible: -allergic reactions like skin rash, itching or hives, swelling of the face, lips, or tongue -breathing problems -changes in vision -chest pain -fever, chills, or any other sign of infection -numbness or tingling -red, scaly patches or raised bumps on the skin -swelling of the ankles -swollen lymph nodes in the neck, underarm, or groin areas -unexplained weight loss -unusual bleeding or bruising -unusually weak or tired Side effects that usually do not require medical attention (report to your doctor or health care professional if they continue or are bothersome): -headache -nausea -redness, itching, swelling, or bruising at site where injected This list may not describe all possible side effects. Call your doctor for medical advice about side effects. You may report side effects to FDA at 1-800-FDA-1088. Where should I keep my medicine? Keep out of the reach of children. Store in the original container and in the refrigerator between 2 and 8 degrees C (36 and 46 degrees F). Do not freeze. The product may be stored in a cool carrier with an ice pack, if needed. Protect from light. Throw away any unused medicine after the expiration date. NOTE: This sheet is a summary. It may not cover all possible information. If you have questions about this medicine, talk to your doctor, pharmacist, or health care provider.  2018 Elsevier/Gold Standard (2014-12-01 11:11:43)

## 2017-11-05 NOTE — Progress Notes (Signed)
S: Patient presents to Patient [place] for review of their specialty medication therapy.  Patient is currently taking Humira for Perianal Crohn's Disease. Patient is managed by Dr. Owens Loffler for this.   Dosing:  Ankylosing spondylitis: SubQ: 40 mg every other week (may continue methotrexate, other nonbiologic DMARDS, corticosteroids, NSAIDs and/or analgesics) Crohn disease: SubQ (may continue aminosalicylates and/or corticosteroids; if necessary, azathioprine, mercaptopurine, or methotrexate may also be continued): Initial: 160 mg (given as four 40 mg injections on day 1 or given as two 40 mg injections per day over 2 consecutive days), then 80 mg 2 weeks later (day 15). Maintenance: 40 mg every other week beginning day 29. Note: Some patients may require 40 mg every week as maintenance therapy Karl Pock [id number]). Hidradenitis suppurativa (Humira only): SubQ: Initial: 160 mg (given as four 40 mg injections on day 1 or given as two 40 mg injections per day over 2 consecutive days), then 80 mg 2 weeks later (day 15). Maintenance: 40 mg every week beginning day 29. Plaque psoriasis: SubQ: Initial: 80 mg as a single dose Maintenance: 40 mg every other week beginning 1 week after initial dose Psoriatic arthritis: SubQ: 40 mg every other week (may continue methotrexate, other nonbiologic DMARDS, corticosteroids, NSAIDs and/or analgesics) Pyoderma gangrenosum (off-label use): SubQ: 40 to 80 mg every week or every other week (Fonder 2006; Heffernan 2007; Hubbard 2005Edison Nasuti 2008). Additional data may be necessary to further define the role of adalimumab in the treatment of this condition. Rheumatoid arthritis: SubQ: 40 mg every other week (may continue methotrexate, other nonbiologic DMARDS, corticosteroids, NSAIDs, and/or analgesics); patients not taking concomitant methotrexate may increase dose to 40 mg every week Ulcerative colitis: SubQ (may continue aminosalicylates and/or  corticosteroids; if necessary, azathioprine, or mercaptopurine may also be continued): Initial: 160 mg (given as four 40 mg injections on day 1 or given as two 40 mg injections per day over 2 consecutive days), then 80 mg 2 weeks later (day 15). Maintenance: 40 mg every other week beginning day 29. Note: Only continue maintenance dose in patients demonstrating clinical remission by 8 weeks (day 57) of therapy. Uveitis (Humira only): SubQ: Initial: 80 mg as a single dose Maintenance: 40 mg every other week beginning 1 week after initial dose  Dose adjustments: Renal: no dose adjustments (has not been studied) Hepatic: no dose adjustments (has not been studied)  Monitoring: S/sx of infection: CBC: S/sx of hypersensitivity: S/sx of malignancy: S/sx of heart failure  O: BP: 130/74   Lab Results  Component Value Date   WBC 10.7 (H) 11/04/2017   HGB 9.7 (L) 11/04/2017   HCT 30.4 (L) 11/04/2017   MCV 80.2 11/04/2017   PLT 732.0 (H) 11/04/2017      Chemistry      Component Value Date/Time   NA 140 11/04/2017 0732   K 3.8 11/04/2017 0732   CL 103 11/04/2017 0732   CO2 26 11/04/2017 0732   BUN 10 11/04/2017 0732   CREATININE 0.71 11/04/2017 0732      Component Value Date/Time   CALCIUM 9.1 11/04/2017 0732   ALKPHOS 71 11/04/2017 0732   AST 12 11/04/2017 0732   ALT 7 11/04/2017 0732   BILITOT 0.3 11/04/2017 0732     A/P: 1. Medication review: Patient currently on Humira for Perianal Crohn's Disease. Reviewed the medication with the patient, including the following: Humira is a TNF blocking agent indicated for ankylosing spondylitis, Crohn's disease, Hidradenitis suppurativa, psoriatic arthritis, plaque psoriasis, ulcerative colitis, and uveitis. Patient  educated on purpose, proper use and potential adverse effects of Humira. The most common adverse effects are infections, headache, and injection site reactions. There is the possibility of an increased risk of malignancy but it  is not well understood if this increased risk is due to there medication or the disease state. There are rare cases of pancytopenia and aplastic anemia. No recommendations for any changes at this time.  Angelica Chessman, MD

## 2017-11-19 MED FILL — METOPROLOL SUCCINATE ER 25: 25 | 90 days supply | Qty: 180 | Fill #3

## 2017-11-19 MED FILL — azaTHIOprine 50 MG TABS: 50 | 30 days supply | Qty: 60 | Fill #1

## 2017-12-02 MED FILL — HUMIRA PEN 40 MG/0.4ML PNKT: 40 | 28 days supply | Qty: 2 | Fill #0

## 2017-12-09 ENCOUNTER — Other Ambulatory Visit (INDEPENDENT_AMBULATORY_CARE_PROVIDER_SITE_OTHER): Payer: 59

## 2017-12-09 DIAGNOSIS — K50119 Crohn's disease of large intestine with unspecified complications: Secondary | ICD-10-CM | POA: Diagnosis not present

## 2017-12-09 DIAGNOSIS — K50114 Crohn's disease of large intestine with abscess: Secondary | ICD-10-CM

## 2017-12-09 LAB — CBC WITH DIFFERENTIAL/PLATELET
Basophils Absolute: 0.1 10*3/uL (ref 0.0–0.1)
Basophils Relative: 1.1 % (ref 0.0–3.0)
EOS ABS: 0.3 10*3/uL (ref 0.0–0.7)
EOS PCT: 3.2 % (ref 0.0–5.0)
HCT: 34.6 % — ABNORMAL LOW (ref 36.0–46.0)
Hemoglobin: 11.5 g/dL — ABNORMAL LOW (ref 12.0–15.0)
LYMPHS ABS: 1.3 10*3/uL (ref 0.7–4.0)
Lymphocytes Relative: 15.3 % (ref 12.0–46.0)
MCHC: 33.2 g/dL (ref 30.0–36.0)
MCV: 84.8 fl (ref 78.0–100.0)
MONO ABS: 0.4 10*3/uL (ref 0.1–1.0)
Monocytes Relative: 4.8 % (ref 3.0–12.0)
NEUTROS PCT: 75.6 % (ref 43.0–77.0)
Neutro Abs: 6.4 10*3/uL (ref 1.4–7.7)
Platelets: 405 10*3/uL — ABNORMAL HIGH (ref 150.0–400.0)
RBC: 4.08 Mil/uL (ref 3.87–5.11)
RDW: 22.3 % — ABNORMAL HIGH (ref 11.5–15.5)
WBC: 8.4 10*3/uL (ref 4.0–10.5)

## 2017-12-09 LAB — COMPREHENSIVE METABOLIC PANEL
ALBUMIN: 4.1 g/dL (ref 3.5–5.2)
ALT: 8 U/L (ref 0–35)
AST: 13 U/L (ref 0–37)
Alkaline Phosphatase: 69 U/L (ref 39–117)
BUN: 14 mg/dL (ref 6–23)
CHLORIDE: 104 meq/L (ref 96–112)
CO2: 29 mEq/L (ref 19–32)
CREATININE: 0.79 mg/dL (ref 0.40–1.20)
Calcium: 9.3 mg/dL (ref 8.4–10.5)
GFR: 94.92 mL/min (ref >60.00)
GLUCOSE: 87 mg/dL (ref 70–99)
POTASSIUM: 4 meq/L (ref 3.5–5.1)
SODIUM: 140 meq/L (ref 135–145)
TOTAL PROTEIN: 7.2 g/dL (ref 6.0–8.3)
Total Bilirubin: 0.4 mg/dL (ref 0.2–1.2)

## 2017-12-09 MED FILL — ELIQUIS 5 MG TABLET: 5 | 30 days supply | Qty: 60 | Fill #3

## 2017-12-09 MED FILL — DILTIAZEM 24HR CD 120 MG CA: 120 | 30 days supply | Qty: 30 | Fill #7

## 2017-12-10 ENCOUNTER — Ambulatory Visit: Payer: 59 | Admitting: Gastroenterology

## 2017-12-11 ENCOUNTER — Ambulatory Visit (INDEPENDENT_AMBULATORY_CARE_PROVIDER_SITE_OTHER): Payer: 59 | Admitting: Gastroenterology

## 2017-12-11 ENCOUNTER — Encounter: Payer: Self-pay | Admitting: Gastroenterology

## 2017-12-11 VITALS — BP 118/60 | HR 70 | Ht 65.0 in | Wt 150.6 lb

## 2017-12-11 DIAGNOSIS — K50119 Crohn's disease of large intestine with unspecified complications: Secondary | ICD-10-CM | POA: Diagnosis not present

## 2017-12-11 DIAGNOSIS — K50114 Crohn's disease of large intestine with abscess: Secondary | ICD-10-CM | POA: Diagnosis not present

## 2017-12-11 NOTE — Patient Instructions (Addendum)
Continue humira every other week. Please return to see Dr. Ardis Hughs in 2 months.  Normal BMI (Body Mass Index- based on height and weight) is between 19 and 25. Your BMI today is Body mass index is 25.06 kg/m. Marland Kitchen Please consider follow up  regarding your BMI with your Primary Care Provider.

## 2017-12-11 NOTE — Progress Notes (Signed)
Review of pertinent gastrointestinal problems: 1. Crohn's disease: Crohn's colitis with severe perianal disease.  No evidence of small bowel disease on 09/2017 CT enterography.  Colonoscopy 09/2017 showed normal terminal ileum, patchy deep ulcers segmentally throughout her colon, severe perianal disease with fistulas and early abscess that was treated by oral Abx, debridement, seton placement 09/2017, Dr. Johney Maine.  Colonoscopy 12/2012 for average risk screening; incidental inflammation right colon (path suggested possible IBD) was attributed to NSAID use at the time.  Labs 09/2017: TB quant gold negative, Hepatitis B Surface Ab negative, Surface Ag negative, Hep C Ab negative; TPMT activity level NORMAL.  Dual therapy with immunomodulators (azathiaprine) and biologic (humira) started 09/2017    HPI: This is a very pleasant 62 year old whom I last saw about 6 weeks ago officially here in the office.  Her anal discomfort is continuing to slowly improved.  She has 1-2 soft stools daily without constipation or straining.  She takes MiraLAX every day.  She has no abdominal pains.  She still has some drainage at the site of the seton, puts a 4 x 4 at the site after any trip to the bathroom.  CBC this week shows her hemoglobin is normalizing on iron supplements, now is 11.5 and was 9.7 previously platelets 405 which is lower, her white blood cell count is normal.  Complete metabolic profile was normal.   Chief complaint is Crohn's colitis with severe perianal disease  ROS: complete GI ROS as described in HPI, all other review negative.  Constitutional:  No unintentional weight loss   Past Medical History:  Diagnosis Date  . Anal fistula   . Anticoagulant long-term use    eliquis  . Crohn's disease of perianal region [facility])    new dx 05/ 2019 by dr Ardis Hughs ([facility])  . Heart murmur   . Hemorrhoids    internal and external  . History of hidradenitis suppurativa    axilla  . Hyperlipidemia   .  Hypertension   . PAF (paroxysmal atrial fibrillation) [facility])    cardiologist-  dr Curt Bears--  first dx 07-18-2015  . Perianal Crohn's disease, with abscess ([place]) 10/08/2017  . Rectal pain   . Uterine fibroid    per MRI 09-16-2017  . Wears glasses     Past Surgical History:  Procedure Laterality Date  . CESAREAN SECTION  x2  last one 1996  . COLONOSCOPY WITH PROPOFOL  last one 09-27-2017  dr d. Ardis Hughs @ [facility]  . INCISION AND DRAINAGE ABSCESS N/A 10/11/2017   Procedure: DRAINAGE OF PERIRECTAL ABSCESSES;  Surgeon: Michael Boston, MD;  Location: [facility];  Service: General;  Laterality: N/A;  . [place] N/A 10/11/2017   Procedure: PLACEMENT OF SETON;  Surgeon: Michael Boston, MD;  Location: [place];  Service: General;  Laterality: N/A;  . RECTAL EXAM UNDER ANESTHESIA N/A 10/11/2017   Procedure: ANORECTAL EXAM UNDER ANESTHESIA;  Surgeon: Michael Boston, MD;  Location: [place];  Service: General;  Laterality: N/A;  . TRANSTHORACIC ECHOCARDIOGRAM  10/08/2012   ef 50-55%,  grade 1 diastolic dysfunction/  trivial MR and TR/  mild LAE    Current Outpatient Medications  Medication Sig Dispense Refill  . Adalimumab (HUMIRA PEN) 40 MG/0.4ML PNKT Inject 40 mg into the skin every 14 (fourteen) days. 2 each 3  . azathioprine (IMURAN) 100 MG tablet Take 1 tablet (100 mg total) by mouth daily. 30 tablet 11  . ELIQUIS 5 MG TABS tablet TAKE 1  TABLET BY MOUTH TWICE DAILY 60 tablet 6  . flecainide (TAMBOCOR) 150 MG tablet Take 2 tablets (300 mg total) by mouth daily as needed (for Atrial Fibrillation). 10 tablet 1  . metoprolol succinate (TOPROL-XL) 25 MG 24 hr tablet Take 1 tablet (25 mg total) by mouth 2 (two) times daily. (Patient taking differently: Take 25 mg by mouth 2 (two) times daily. ) 180 tablet 3  . Polyethylene Glycol 3350 (MIRALAX PO) Take by mouth daily.    . Adalimumab (HUMIRA PEN-CD/UC/HS STARTER) 80 MG/0.8ML PNKT Inject 2 pens  into the skin once for 1 dose. Then 80 mg (1 Pen) on Day 15 3 each 0  . diltiazem (CARDIZEM CD) 120 MG 24 hr capsule Take 1 capsule (120 mg total) by mouth daily. (Patient taking differently: Take 120 mg by mouth every morning. ) 30 capsule 11   No current facility-administered medications for this visit.    Facility-Administered Medications Ordered in Other Visits  Medication Dose Route Frequency Provider Last Rate Last Dose  . Chlorhexidine Gluconate Cloth 2 % PADS 6 each  6 each Topical Once Michael Boston, MD       And  . Chlorhexidine Gluconate Cloth 2 % PADS 6 each  6 each Topical Once Michael Boston, MD        Allergies as of 12/11/2017 - Review Complete 12/11/2017  Allergen Reaction Noted  . Latex Rash 10/10/2017  . Lovastatin  08/11/2012  . Protonix [pantoprazole] Diarrhea 10/10/2017  . Adhesive [tape] Rash 10/10/2017  . Penicillins Rash 08/11/2012    Family History  Problem Relation Age of Onset  . Stroke Mother   . Hypertension Mother   . Hyperlipidemia Mother   . Thyroid disease Mother   . Cancer Father        prostate  . Colon cancer Maternal Aunt   . Diabetes Neg Hx   . Depression Neg Hx   . Early death Neg Hx   . Hearing loss Neg Hx   . Heart disease Neg Hx   . Kidney disease Neg Hx   . Alcohol abuse Neg Hx   . Breast cancer Neg Hx   . Esophageal cancer Neg Hx   . Stomach cancer Neg Hx   . Rectal cancer Neg Hx     Social History   Socioeconomic History  . Marital status: Married    Spouse name: Not on file  . Number of children: Not on file  . Years of education: Not on file  . Highest education level: Not on file  Occupational History  . Occupation: registered Optician, dispensing: [employer]  . Financial resource strain: Not on file  . Food insecurity:    Worry: Not on file    Inability: Not on file  . Transportation needs:    Medical: Not on file    Non-medical: Not on file  Tobacco Use  . Smoking status: Never Smoker  .  Smokeless tobacco: Never Used  Substance and Sexual Activity  . Alcohol use: No  . Drug use: No  . Sexual activity: Not on file  Lifestyle  . Physical activity:    Days per week: Not on file    Minutes per session: Not on file  . Stress: Not on file  Relationships  . Social connections:    Talks on phone: Not on file    Gets together: Not on file    Attends religious service: Not on file  Active member of club or organization: Not on file    Attends meetings of clubs or organizations: Not on file    Relationship status: Not on file  . Intimate partner violence:    Fear of current or ex partner: Not on file    Emotionally abused: Not on file    Physically abused: Not on file    Forced sexual activity: Not on file  Other Topics Concern  . Not on file  Social History Narrative  . Not on file     Physical Exam: BP 118/60   Pulse 70   Ht 5' 5"  (1.651 m)   Wt 150 lb 9.6 oz (68.3 kg)   BMI 25.06 kg/m  Constitutional: generally well-appearing Psychiatric: alert and oriented x3 Abdomen: soft, nontender, nondistended, no obvious ascites, no peritoneal signs, normal bowel sounds No peripheral edema noted in lower extremities Rectal examination with female assistant in the room: Seton in position, at that site there is a much improved small ulcer.  She has 2 or 3 medium sized nonthrombosed hemorrhoids without any overt ulceration associated.   Assessment and plan: 62 y.o. female with Crohn's colitis with severe perianal disease  Perianal disease is clearly improving with dual therapy immunomodulators and Humira.  She still has some discomfort at her anus but this is clearly improving.  Hopefully the seton will be able to be removed next month when she revisits with Dr. gross.  She is going to continue Humira every other week.  She will return to see me officially in the office in 2 months and sooner if any troubles.  She should continue taking iron once daily.  Her blood counts  are normalizing.  Please see the "Patient Instructions" section for addition details about the plan.  Owens Loffler, MD [facility] 12/11/2017, 11:02 AM

## 2017-12-19 MED FILL — azaTHIOprine 50 MG TABS: 50 | 30 days supply | Qty: 60 | Fill #2

## 2017-12-31 MED FILL — HUMIRA PEN 40 MG/0.4ML PNKT: 40 | 28 days supply | Qty: 2 | Fill #1

## 2018-01-06 ENCOUNTER — Other Ambulatory Visit (INDEPENDENT_AMBULATORY_CARE_PROVIDER_SITE_OTHER): Payer: 59

## 2018-01-06 DIAGNOSIS — Z23 Encounter for immunization: Secondary | ICD-10-CM

## 2018-01-06 MED FILL — CARTIA XT 120 MG CAPSULE SA: 120 | 30 days supply | Qty: 30 | Fill #8

## 2018-01-06 MED FILL — ELIQUIS 5 MG TABLET: 5 | 30 days supply | Qty: 60 | Fill #4

## 2018-01-16 MED FILL — azaTHIOprine 50 MG TABS: 50 | 30 days supply | Qty: 60 | Fill #3

## 2018-01-22 MED FILL — HUMIRA PEN 40 MG/0.4ML PNKT: 40 | 28 days supply | Qty: 2 | Fill #2

## 2018-02-04 ENCOUNTER — Other Ambulatory Visit (INDEPENDENT_AMBULATORY_CARE_PROVIDER_SITE_OTHER): Payer: 59

## 2018-02-04 ENCOUNTER — Encounter: Payer: Self-pay | Admitting: Gastroenterology

## 2018-02-04 ENCOUNTER — Ambulatory Visit (INDEPENDENT_AMBULATORY_CARE_PROVIDER_SITE_OTHER): Payer: 59 | Admitting: Gastroenterology

## 2018-02-04 VITALS — BP 116/58 | HR 70 | Ht 64.0 in | Wt 152.0 lb

## 2018-02-04 DIAGNOSIS — K50114 Crohn's disease of large intestine with abscess: Secondary | ICD-10-CM

## 2018-02-04 DIAGNOSIS — K50113 Crohn's disease of large intestine with fistula: Secondary | ICD-10-CM | POA: Diagnosis not present

## 2018-02-04 DIAGNOSIS — K50119 Crohn's disease of large intestine with unspecified complications: Secondary | ICD-10-CM

## 2018-02-04 DIAGNOSIS — Z9889 Other specified postprocedural states: Secondary | ICD-10-CM | POA: Diagnosis not present

## 2018-02-04 DIAGNOSIS — K643 Fourth degree hemorrhoids: Secondary | ICD-10-CM | POA: Diagnosis not present

## 2018-02-04 LAB — COMPREHENSIVE METABOLIC PANEL
ALBUMIN: 3.9 g/dL (ref 3.5–5.2)
ALK PHOS: 100 U/L (ref 39–117)
ALT: 8 U/L (ref 0–35)
AST: 12 U/L (ref 0–37)
BILIRUBIN TOTAL: 0.3 mg/dL (ref 0.2–1.2)
BUN: 15 mg/dL (ref 6–23)
CALCIUM: 9 mg/dL (ref 8.4–10.5)
CO2: 26 mEq/L (ref 19–32)
CREATININE: 0.73 mg/dL (ref 0.40–1.20)
Chloride: 102 mEq/L (ref 96–112)
GFR: 103.93 mL/min (ref >60.00)
Glucose, Bld: 91 mg/dL (ref 70–99)
Potassium: 3.5 mEq/L (ref 3.5–5.1)
Sodium: 139 mEq/L (ref 135–145)
TOTAL PROTEIN: 7.9 g/dL (ref 6.0–8.3)

## 2018-02-04 LAB — CBC WITH DIFFERENTIAL/PLATELET
BASOS ABS: 0.1 10*3/uL (ref 0.0–0.1)
Basophils Relative: 0.6 % (ref 0.0–3.0)
Eosinophils Absolute: 0.3 10*3/uL (ref 0.0–0.7)
Eosinophils Relative: 2.7 % (ref 0.0–5.0)
HEMATOCRIT: 35.3 % — AB (ref 36.0–46.0)
Hemoglobin: 11.7 g/dL — ABNORMAL LOW (ref 12.0–15.0)
LYMPHS PCT: 12.5 % (ref 12.0–46.0)
Lymphs Abs: 1.3 10*3/uL (ref 0.7–4.0)
MCHC: 33.1 g/dL (ref 30.0–36.0)
MCV: 85.7 fl (ref 78.0–100.0)
Monocytes Absolute: 0.9 10*3/uL (ref 0.1–1.0)
Monocytes Relative: 8.9 % (ref 3.0–12.0)
NEUTROS PCT: 75.3 % (ref 43.0–77.0)
Neutro Abs: 7.7 10*3/uL (ref 1.4–7.7)
Platelets: 433 10*3/uL — ABNORMAL HIGH (ref 150.0–400.0)
RBC: 4.12 Mil/uL (ref 3.87–5.11)
RDW: 16.3 % — ABNORMAL HIGH (ref 11.5–15.5)
WBC: 10.2 10*3/uL (ref 4.0–10.5)

## 2018-02-04 MED FILL — metroNIDAZOLE 500 MG TABS: 500 | 10 days supply | Qty: 30 | Fill #0

## 2018-02-04 MED FILL — CIPROFLOXACIN HCL 500 MG TA: 500 | 10 days supply | Qty: 20 | Fill #0

## 2018-02-04 NOTE — Patient Instructions (Addendum)
You will have labs checked today in the basement lab.  Please head down after you check out with the front desk  (cbc, cmet).  Expedited visit back to see Dr. Johney Maine, we will call today. Appointment   Humira TROUGH level (1-2 days prior to your next injection) and humira antibodies at the same time.  Please return to see Dr. Ardis Hughs in 2 months  Thank you for entrusting me with your care and choosing [facility].  Dr Ardis Hughs    Notes recorded by Milus Banister, MD on 02/10/2018 at 7:23 AM EDT NOt sure why these were drawn last week but they are probably still helpful. Please call her, she does not need labs drawn today. She does not have antibodies to humira. Also her mid dose (not trough) level was 5.7 and I don't think that is high enough. I'd like her to increase to humira to 73m every other week (target trough is >7.5 and her trough would undoubtedly be lower than her dose at mid point between injections). Can you see if we can get her 869mdosing starting with tomorrows dose.

## 2018-02-04 NOTE — Progress Notes (Signed)
Review of pertinent gastrointestinal problems: 1.Crohn's disease:Crohn's colitis withsevereperianal disease.No evidence of small bowel disease on5/2019 CT enterography. Colonoscopy 09/2017 showed normal terminal ileum, patchy deep ulcers segmentally throughout her colon, severe perianal disease with fistulas and early abscess that was treated byoral Abx,debridement, seton placement 09/2017, Dr. Johney Maine.Colonoscopy 12/2012 for average risk screening; incidental inflammation right colon (path suggested possible IBD) was attributed to NSAID use at the time.  Labs 09/2017: TB quant gold negative, Hepatitis B Surface Ab negative, Surface Ag negative, Hep C Ab negative; TPMT activity level NORMAL.  Dual therapy with immunomodulators(azathiaprine)and biologic(humira)started 09/2017   HPI: This is a very pleasant 62 year old woman whom I last saw about 2 months  One BM daily, soft non-bloody.    She is still bothered by persistent drainage around her anus.  She has a seton still in place.  She has gauze in place nearly 24 hours a day.  There is usually spots of blood on the gauze and purulence on the gauze as well when she changes it  She is seeing Dr. Johney Maine in about 2 weeks.  No abd pains.   She is very good about her Humira shots every other Tuesday  Chief complaint is Crohn's colitis, severe perianal disease  ROS: complete GI ROS as described in HPI, all other review negative.  Constitutional:  No unintentional weight loss   Past Medical History:  Diagnosis Date  . Anal fistula   . Anticoagulant long-term use    eliquis  . Crohn's disease of perianal region [facility])    new dx 05/ 2019 by dr Ardis Hughs ([facility])  . Heart murmur   . Hemorrhoids    internal and external  . History of hidradenitis suppurativa    axilla  . Hyperlipidemia   . Hypertension   . PAF (paroxysmal atrial fibrillation) [facility])    cardiologist-  dr Curt Bears--  first dx 07-18-2015  . Perianal Crohn's  disease, with abscess ([place]) 10/08/2017  . Rectal pain   . Uterine fibroid    per MRI 09-16-2017  . Wears glasses     Past Surgical History:  Procedure Laterality Date  . CESAREAN SECTION  x2  last one 1996  . COLONOSCOPY WITH PROPOFOL  last one 09-27-2017  dr d. Ardis Hughs @ [facility]  . INCISION AND DRAINAGE ABSCESS N/A 10/11/2017   Procedure: DRAINAGE OF PERIRECTAL ABSCESSES;  Surgeon: Michael Boston, MD;  Location: [place];  Service: General;  Laterality: N/A;  . [place] N/A 10/11/2017   Procedure: PLACEMENT OF SETON;  Surgeon: Michael Boston, MD;  Location: [place];  Service: General;  Laterality: N/A;  . RECTAL EXAM UNDER ANESTHESIA N/A 10/11/2017   Procedure: ANORECTAL EXAM UNDER ANESTHESIA;  Surgeon: Michael Boston, MD;  Location: [place];  Service: General;  Laterality: N/A;  . TRANSTHORACIC ECHOCARDIOGRAM  10/08/2012   ef 50-55%,  grade 1 diastolic dysfunction/  trivial MR and TR/  mild LAE    Current Outpatient Medications  Medication Sig Dispense Refill  . Adalimumab (HUMIRA PEN) 40 MG/0.4ML PNKT Inject 40 mg into the skin every 14 (fourteen) days. 2 each 3  . azathioprine (IMURAN) 100 MG tablet Take 1 tablet (100 mg total) by mouth daily. 30 tablet 11  . ELIQUIS 5 MG TABS tablet TAKE 1 TABLET BY MOUTH TWICE DAILY 60 tablet 6  . flecainide (TAMBOCOR) 150 MG tablet Take 2 tablets (300 mg total) by mouth daily as needed (for Atrial Fibrillation). 10 tablet 1  .  metoprolol succinate (TOPROL-XL) 25 MG 24 hr tablet Take 1 tablet (25 mg total) by mouth 2 (two) times daily. (Patient taking differently: Take 25 mg by mouth 2 (two) times daily. ) 180 tablet 3  . Polyethylene Glycol 3350 (MIRALAX PO) Take by mouth daily.    . Adalimumab (HUMIRA PEN-CD/UC/HS STARTER) 80 MG/0.8ML PNKT Inject 2 pens into the skin once for 1 dose. Then 80 mg (1 Pen) on Day 15 3 each 0  . diltiazem (CARDIZEM CD) 120 MG 24 hr capsule Take 1 capsule  (120 mg total) by mouth daily. (Patient taking differently: Take 120 mg by mouth every morning. ) 30 capsule 11   No current facility-administered medications for this visit.    Facility-Administered Medications Ordered in Other Visits  Medication Dose Route Frequency Provider Last Rate Last Dose  . Chlorhexidine Gluconate Cloth 2 % PADS 6 each  6 each Topical Once Michael Boston, MD       And  . Chlorhexidine Gluconate Cloth 2 % PADS 6 each  6 each Topical Once Michael Boston, MD        Allergies as of 02/04/2018 - Review Complete 02/04/2018  Allergen Reaction Noted  . Latex Rash 10/10/2017  . Lovastatin  08/11/2012  . Protonix [pantoprazole] Diarrhea 10/10/2017  . Adhesive [tape] Rash 10/10/2017  . Penicillins Rash 08/11/2012    Family History  Problem Relation Age of Onset  . Stroke Mother   . Hypertension Mother   . Hyperlipidemia Mother   . Thyroid disease Mother   . Cancer Father        prostate  . Colon cancer Maternal Aunt   . Diabetes Neg Hx   . Depression Neg Hx   . Early death Neg Hx   . Hearing loss Neg Hx   . Heart disease Neg Hx   . Kidney disease Neg Hx   . Alcohol abuse Neg Hx   . Breast cancer Neg Hx   . Esophageal cancer Neg Hx   . Stomach cancer Neg Hx   . Rectal cancer Neg Hx     Social History   Socioeconomic History  . Marital status: Married    Spouse name: Not on file  . Number of children: Not on file  . Years of education: Not on file  . Highest education level: Not on file  Occupational History  . Occupation: registered Optician, dispensing: [employer]  . Financial resource strain: Not on file  . Food insecurity:    Worry: Not on file    Inability: Not on file  . Transportation needs:    Medical: Not on file    Non-medical: Not on file  Tobacco Use  . Smoking status: Never Smoker  . Smokeless tobacco: Never Used  Substance and Sexual Activity  . Alcohol use: No  . Drug use: No  . Sexual activity: Not on file   Lifestyle  . Physical activity:    Days per week: Not on file    Minutes per session: Not on file  . Stress: Not on file  Relationships  . Social connections:    Talks on phone: Not on file    Gets together: Not on file    Attends religious service: Not on file    Active member of club or organization: Not on file    Attends meetings of clubs or organizations: Not on file    Relationship status: Not on file  . Intimate partner violence:  Fear of current or ex partner: Not on file    Emotionally abused: Not on file    Physically abused: Not on file    Forced sexual activity: Not on file  Other Topics Concern  . Not on file  Social History Narrative  . Not on file     Physical Exam: BP (!) 116/58   Pulse 70   Ht 5' 4"  (1.626 m)   Wt 152 lb (68.9 kg)   BMI 26.09 kg/m  Constitutional: generally well-appearing Psychiatric: alert and oriented x3 Abdomen: soft, nontender, nondistended, no obvious ascites, no peritoneal signs, normal bowel sounds No peripheral edema noted in lower extremities Rectal examination with female assistant in the room: Semi-thrombosed hemorrhoids present, left side of anus seton in position with improving ulceration at the site.  The right side of her anus is more concerning today.  There is a pinpoint area that is able to express purulence, there is a quarter sized area of fluctuance a bit more distant from her anus.  Assessment and plan: 62 y.o. female with Crohn's colitis, severe perianal disease  I am going to get her back in to see surgeons on an expedited basis in the next day or 2.  I am concerned about the right side of her anus, perianal fluctuance.  If she is not able to see someone there the next couple days I will start her on empiric antibiotics Cipro Flagyl.  She has responded to this in the past.  Have to wonder if her current Crohn's medical therapy is working well enough.  She is due for her next Humira a week from now.  The day prior  she will have Humira trough as well as Humira antibodies checked.  She will also have a CBC and complete metabolic profile today.  I would at least like to see her in the office again in 2 weeks.  May need to consider changing her to Remicade or up dosing her Humira.  Please see the "Patient Instructions" section for addition details about the plan.  Owens Loffler, MD [facility] 02/04/2018, 8:51 AM

## 2018-02-07 MED FILL — CARTIA XT 120 MG CAPSULE SA: 120 | 30 days supply | Qty: 30 | Fill #9

## 2018-02-08 LAB — SERIAL MONITORING

## 2018-02-09 LAB — ADALIMUMAB+AB (SERIAL MONITOR)
ADALIMUMAB DRUG LEVEL: 5.7 ug/mL
Anti-Adalimumab Antibody: <25 ng/mL

## 2018-02-10 ENCOUNTER — Other Ambulatory Visit: Payer: Self-pay

## 2018-02-10 ENCOUNTER — Other Ambulatory Visit: Payer: Self-pay | Admitting: Pharmacist

## 2018-02-10 ENCOUNTER — Other Ambulatory Visit: Payer: 59

## 2018-02-10 MED ORDER — ADALIMUMAB 40 MG/0.4ML ~~LOC~~ AJKT: 80.0000 mg | AUTO-INJECTOR | SUBCUTANEOUS | 6 refills | Status: DC

## 2018-02-10 MED ORDER — ADALIMUMAB 80 MG/0.8ML ~~LOC~~ AJKT: 80.0000 mg | AUTO-INJECTOR | SUBCUTANEOUS | 6 refills | Status: DC

## 2018-02-10 MED ORDER — ADALIMUMAB 40 MG/0.4ML ~~LOC~~ AJKT: 40.0000 mg | AUTO-INJECTOR | Freq: Once | SUBCUTANEOUS | 0 refills | Status: DC

## 2018-02-12 MED FILL — HUMIRA PEN 40 MG/0.4ML PNKT: 40 | 28 days supply | Qty: 2 | Fill #0

## 2018-02-12 MED FILL — ELIQUIS 5 MG TABLET: 5 | 30 days supply | Qty: 60 | Fill #5

## 2018-02-17 ENCOUNTER — Other Ambulatory Visit: Payer: Self-pay

## 2018-02-17 MED ORDER — ADALIMUMAB 40 MG/0.4ML ~~LOC~~ AJKT: 80.0000 mg | AUTO-INJECTOR | SUBCUTANEOUS | 6 refills | Status: DC

## 2018-02-19 ENCOUNTER — Telehealth: Payer: Self-pay | Admitting: Internal Medicine

## 2018-02-19 DIAGNOSIS — I10 Essential (primary) hypertension: Secondary | ICD-10-CM

## 2018-02-19 DIAGNOSIS — I480 Paroxysmal atrial fibrillation: Secondary | ICD-10-CM

## 2018-02-19 MED ORDER — METOPROLOL SUCCINATE ER 25 MG PO TB24: 25.0000 mg | ORAL_TABLET | Freq: Two times a day (BID) | ORAL | 0 refills | Status: DC

## 2018-02-19 MED FILL — METOPROLOL SUCCINATE ER 25: 25 | 30 days supply | Qty: 60 | Fill #0

## 2018-02-19 MED FILL — azaTHIOprine 50 MG TABS: 50 | 30 days supply | Qty: 60 | Fill #4

## 2018-02-19 NOTE — Addendum Note (Signed)
Addended by: Dimple Nanas on: 02/19/2018 01:54 PM   Modules accepted: Orders

## 2018-02-19 NOTE — Telephone Encounter (Signed)
Pt has an appt in October. Pt also would like to pick up medication today

## 2018-02-19 NOTE — Telephone Encounter (Signed)
Pt given 30 day supply of Metoprolol until appt on 03/19/18.

## 2018-02-24 MED FILL — CIPROFLOXACIN HCL 500 MG TA: 500 | 10 days supply | Qty: 20 | Fill #1

## 2018-02-24 MED FILL — HUMIRA PEN 40 MG/0.4ML PNKT: 40 | 28 days supply | Qty: 4 | Fill #1

## 2018-02-24 MED FILL — metroNIDAZOLE 500 MG TABS: 500 | 10 days supply | Qty: 30 | Fill #1

## 2018-03-04 DIAGNOSIS — Z9889 Other specified postprocedural states: Secondary | ICD-10-CM | POA: Diagnosis not present

## 2018-03-04 DIAGNOSIS — K643 Fourth degree hemorrhoids: Secondary | ICD-10-CM | POA: Diagnosis not present

## 2018-03-04 DIAGNOSIS — K50113 Crohn's disease of large intestine with fistula: Secondary | ICD-10-CM | POA: Diagnosis not present

## 2018-03-04 DIAGNOSIS — K611 Rectal abscess: Secondary | ICD-10-CM | POA: Diagnosis not present

## 2018-03-07 MED FILL — CARTIA XT 120 MG CAPSULE: 120 | 30 days supply | Qty: 30 | Fill #10

## 2018-03-18 MED FILL — ELIQUIS 5 MG TABLET: 5 | 30 days supply | Qty: 60 | Fill #6

## 2018-03-19 ENCOUNTER — Encounter: Payer: Self-pay | Admitting: Internal Medicine

## 2018-03-19 ENCOUNTER — Other Ambulatory Visit (INDEPENDENT_AMBULATORY_CARE_PROVIDER_SITE_OTHER): Payer: 59

## 2018-03-19 ENCOUNTER — Ambulatory Visit (INDEPENDENT_AMBULATORY_CARE_PROVIDER_SITE_OTHER): Payer: 59 | Admitting: Internal Medicine

## 2018-03-19 VITALS — BP 142/72 | HR 79 | Ht 64.0 in | Wt 154.0 lb

## 2018-03-19 DIAGNOSIS — Z1231 Encounter for screening mammogram for malignant neoplasm of breast: Secondary | ICD-10-CM

## 2018-03-19 DIAGNOSIS — I480 Paroxysmal atrial fibrillation: Secondary | ICD-10-CM

## 2018-03-19 DIAGNOSIS — D500 Iron deficiency anemia secondary to blood loss (chronic): Secondary | ICD-10-CM | POA: Diagnosis not present

## 2018-03-19 DIAGNOSIS — D539 Nutritional anemia, unspecified: Secondary | ICD-10-CM

## 2018-03-19 DIAGNOSIS — Z0000 Encounter for general adult medical examination without abnormal findings: Secondary | ICD-10-CM

## 2018-03-19 DIAGNOSIS — I10 Essential (primary) hypertension: Secondary | ICD-10-CM

## 2018-03-19 DIAGNOSIS — E785 Hyperlipidemia, unspecified: Secondary | ICD-10-CM

## 2018-03-19 DIAGNOSIS — D510 Vitamin B12 deficiency anemia due to intrinsic factor deficiency: Secondary | ICD-10-CM | POA: Diagnosis not present

## 2018-03-19 DIAGNOSIS — E559 Vitamin D deficiency, unspecified: Secondary | ICD-10-CM | POA: Diagnosis not present

## 2018-03-19 LAB — CBC WITH DIFFERENTIAL/PLATELET
Basophils Absolute: 0 10*3/uL (ref 0.0–0.1)
Basophils Relative: 0.4 % (ref 0.0–3.0)
EOS PCT: 1.8 % (ref 0.0–5.0)
Eosinophils Absolute: 0.2 10*3/uL (ref 0.0–0.7)
HEMATOCRIT: 31.9 % — AB (ref 36.0–46.0)
Hemoglobin: 10.6 g/dL — ABNORMAL LOW (ref 12.0–15.0)
LYMPHS ABS: 0.7 10*3/uL (ref 0.7–4.0)
Lymphocytes Relative: 6.3 % — ABNORMAL LOW (ref 12.0–46.0)
MCHC: 33.2 g/dL (ref 30.0–36.0)
MCV: 89.6 fl (ref 78.0–100.0)
MONOS PCT: 6.7 % (ref 3.0–12.0)
Monocytes Absolute: 0.7 10*3/uL (ref 0.1–1.0)
NEUTROS PCT: 84.8 % — AB (ref 43.0–77.0)
Neutro Abs: 8.8 10*3/uL — ABNORMAL HIGH (ref 1.4–7.7)
Platelets: 470 10*3/uL — ABNORMAL HIGH (ref 150.0–400.0)
RBC: 3.56 Mil/uL — AB (ref 3.87–5.11)
RDW: 16.8 % — ABNORMAL HIGH (ref 11.5–15.5)
WBC: 10.4 10*3/uL (ref 4.0–10.5)

## 2018-03-19 LAB — IBC PANEL
Iron: 15 ug/dL — ABNORMAL LOW (ref 42–145)
SATURATION RATIOS: 5.6 % — AB (ref 20.0–50.0)
TRANSFERRIN: 190 mg/dL — AB (ref 212.0–360.0)

## 2018-03-19 LAB — LIPID PANEL
Cholesterol: 214 mg/dL — ABNORMAL HIGH (ref 0–200)
HDL: 43 mg/dL (ref >39.00)
LDL Cholesterol: 145 mg/dL — ABNORMAL HIGH (ref 0–99)
NONHDL: 171.14
Total CHOL/HDL Ratio: 5
Triglycerides: 129 mg/dL (ref 0.0–149.0)
VLDL: 25.8 mg/dL (ref 0.0–40.0)

## 2018-03-19 LAB — VITAMIN D 25 HYDROXY (VIT D DEFICIENCY, FRACTURES): VITD: 9.14 ng/mL — ABNORMAL LOW (ref 30.00–100.00)

## 2018-03-19 LAB — FERRITIN: FERRITIN: 28.9 ng/mL (ref 10.0–291.0)

## 2018-03-19 LAB — VITAMIN B12: Vitamin B-12: 188 pg/mL — ABNORMAL LOW (ref 211–911)

## 2018-03-19 LAB — FOLATE: Folate: 12.1 ng/mL (ref >5.9)

## 2018-03-19 MED ORDER — CHOLECALCIFEROL 1.25 MG (50000 UT) PO CAPS: 50000.0000 [IU] | ORAL_CAPSULE | ORAL | 1 refills | Status: DC

## 2018-03-19 MED ORDER — METOPROLOL SUCCINATE ER 25 MG PO TB24: 25.0000 mg | ORAL_TABLET | Freq: Two times a day (BID) | ORAL | 1 refills | Status: DC

## 2018-03-19 MED ORDER — FERROUS SULFATE 325 (65 FE) MG PO TABS: 325.0000 mg | ORAL_TABLET | Freq: Two times a day (BID) | ORAL | 1 refills | Status: DC

## 2018-03-19 MED ORDER — CYANOCOBALAMIN 1000 MCG/ML IJ SOLN
1000.0000 ug | Freq: Once | INTRAMUSCULAR | Status: AC
  Administered 2018-03-19: 1000 ug via INTRAMUSCULAR

## 2018-03-19 MED FILL — azaTHIOprine 50 MG TABS: 50 | 30 days supply | Qty: 60 | Fill #5

## 2018-03-19 MED FILL — METOPROLOL SUCCINATE ER 25: 25 | 90 days supply | Qty: 180 | Fill #0

## 2018-03-19 MED FILL — VITAMIN D3 50000 UNIT CAPS: 1.25 MG | 84 days supply | Qty: 12 | Fill #0

## 2018-03-19 NOTE — Progress Notes (Signed)
Subjective:  Patient ID: Julie Clark, female    DOB: August 05, 1955  Age: 62 y.o. MRN: [id number]  CC: Anemia; Annual Exam; Hypertension; and Atrial Fibrillation   HPI Julie Clark presents for a CPX.  Since I last saw her she has been diagnosed with Crohn's disease with perianal complications.  She has had multiple surgical procedures and continues to have anal pain and intermittent bloody diarrhea. She recently started taking a biological agent and her symptoms are improving.  She was told that she is anemic and has been taking an over-the-counter iron supplement.  Outpatient Medications Prior to Visit  Medication Sig Dispense Refill  . azathioprine (IMURAN) 100 MG tablet Take 1 tablet (100 mg total) by mouth daily. 30 tablet 11  . ELIQUIS 5 MG TABS tablet TAKE 1 TABLET BY MOUTH TWICE DAILY 60 tablet 6  . flecainide (TAMBOCOR) 150 MG tablet Take 2 tablets (300 mg total) by mouth daily as needed (for Atrial Fibrillation). 10 tablet 1  . Polyethylene Glycol 3350 (MIRALAX PO) Take by mouth daily.    . metoprolol succinate (TOPROL-XL) 25 MG 24 hr tablet Take 1 tablet (25 mg total) by mouth 2 (two) times daily. 60 tablet 0  . Adalimumab (HUMIRA PEN) 40 MG/0.4ML PNKT Inject 80 mg into the skin every 14 (fourteen) days for 6 days. 4 each 6  . diltiazem (CARDIZEM CD) 120 MG 24 hr capsule Take 1 capsule (120 mg total) by mouth daily. (Patient taking differently: Take 120 mg by mouth every morning. ) 30 capsule 11   Facility-Administered Medications Prior to Visit  Medication Dose Route Frequency Provider Last Rate Last Dose  . Chlorhexidine Gluconate Cloth 2 % PADS 6 each  6 each Topical Once Michael Boston, MD       And  . Chlorhexidine Gluconate Cloth 2 % PADS 6 each  6 each Topical Once Michael Boston, MD        ROS Review of Systems  Constitutional: Negative.  Negative for diaphoresis, fatigue and unexpected weight change.  HENT: Negative.   Eyes: Negative for visual disturbance.    Respiratory: Negative for cough, chest tightness, shortness of breath and wheezing.   Cardiovascular: Negative for chest pain, palpitations and leg swelling.  Gastrointestinal: Positive for anal bleeding, blood in stool and rectal pain. Negative for abdominal pain, constipation and diarrhea.  Genitourinary: Negative.  Negative for decreased urine volume, difficulty urinating, dysuria, hematuria, vaginal bleeding and vaginal pain.  Musculoskeletal: Negative for back pain.  Skin: Negative.  Negative for pallor.  Neurological: Negative.  Negative for dizziness, weakness, light-headedness and numbness.  Hematological: Negative for adenopathy. Does not bruise/bleed easily.  Psychiatric/Behavioral: Negative.     Objective:  BP (!) 142/72 (BP Location: Left Arm, Patient Position: Sitting, Cuff Size: Normal)   Pulse 79   Ht 5' 4"  (1.626 m)   Wt 154 lb (69.9 kg)   SpO2 99%   BMI 26.43 kg/m   BP Readings from Last 3 Encounters:  03/19/18 (!) 142/72  02/04/18 (!) 116/58  12/11/17 118/60    Wt Readings from Last 3 Encounters:  03/19/18 154 lb (69.9 kg)  02/04/18 152 lb (68.9 kg)  12/11/17 150 lb 9.6 oz (68.3 kg)    Physical Exam  Constitutional: She is oriented to person, place, and time. No distress.  HENT:  Mouth/Throat: Oropharynx is clear and moist. No oropharyngeal exudate.  Eyes: Conjunctivae are normal. No scleral icterus.  Neck: Normal range of motion. Neck supple. No JVD present. No  thyromegaly present.  Cardiovascular: Normal rate, regular rhythm and normal heart sounds. Exam reveals no gallop.  No murmur heard. Pulmonary/Chest: Effort normal and breath sounds normal. No respiratory distress. She has no wheezes. She has no rhonchi. She has no rales.  Abdominal: Soft. Normal appearance and bowel sounds are normal. She exhibits no mass. There is no hepatosplenomegaly. There is no tenderness. No hernia.  Genitourinary:  Genitourinary Comments: GYN exam deferred at her request  today since she is having perianal complications  Musculoskeletal: Normal range of motion. She exhibits no edema, tenderness or deformity.  Lymphadenopathy:    She has no cervical adenopathy.  Neurological: She is alert and oriented to person, place, and time.  Skin: Skin is warm and dry. She is not diaphoretic. No pallor.  Vitals reviewed.   Lab Results  Component Value Date   WBC 10.4 03/19/2018   HGB 10.6 (L) 03/19/2018   HCT 31.9 (L) 03/19/2018   PLT 470.0 (H) 03/19/2018   GLUCOSE 91 02/04/2018   CHOL 214 (H) 03/19/2018   TRIG 129.0 03/19/2018   HDL 43.00 03/19/2018   LDLCALC 145 (H) 03/19/2018   ALT 8 02/04/2018   AST 12 02/04/2018   NA 139 02/04/2018   K 3.5 02/04/2018   CL 102 02/04/2018   CREATININE 0.73 02/04/2018   BUN 15 02/04/2018   CO2 26 02/04/2018   TSH 0.69 03/19/2018    No results found.  Assessment & Plan:   Tyreisha was seen today for anemia, annual exam, hypertension and atrial fibrillation.  Diagnoses and all orders for this visit:  Hyperlipidemia with target LDL less than 160- Her ASCVD risk score is less than 15% so I do not recommend a statin for CV risk reduction. -     Thyroid Panel With TSH; Future  Essential hypertension, benign- Her blood pressure is not quite adequately well controlled.  I will treat the vitamin D deficiency. -     metoprolol succinate (TOPROL-XL) 25 MG 24 hr tablet; Take 1 tablet (25 mg total) by mouth 2 (two) times daily. -     VITAMIN D 25 Hydroxy (Vit-D Deficiency, Fractures); Future  Paroxysmal atrial fibrillation ([place])- She has good rate and rhythm control.  I will monitor her TFTs.  Will continue metoprolol at the current dose.  Will continue anticoagulation with Eliquis. -     metoprolol succinate (TOPROL-XL) 25 MG 24 hr tablet; Take 1 tablet (25 mg total) by mouth 2 (two) times daily. -     Thyroid Panel With TSH; Future  Visit for screening mammogram  Routine general medical examination at a health care  facility- Exam completed, labs reviewed, vaccines reviewed and updated, she deferred on a Pap smear today, mammogram and screening for colon cancer all up-to-date. -     Lipid panel; Future  Deficiency anemia- She continues to be anemic and has low B12 and iron levels. -     Vitamin B12; Future -     CBC with Differential/Platelet; Future -     IBC panel; Future -     Folate; Future -     Ferritin; Future -     Vitamin B1; Future  Vitamin D deficiency disease -     Cholecalciferol 50000 units capsule; Take 1 capsule (50,000 Units total) by mouth once a week.  Vitamin B12 deficiency anemia due to intrinsic factor deficiency -     cyanocobalamin ((VITAMIN B-12)) injection 1,000 mcg  Iron deficiency anemia due to chronic blood loss -  ferrous sulfate 325 (65 FE) MG tablet; Take 1 tablet (325 mg total) by mouth 2 (two) times daily with a meal.   I am having Julie Clark. Julie Clark start on ferrous sulfate and Cholecalciferol. I am also having her maintain her flecainide, diltiazem, ELIQUIS, Polyethylene Glycol 3350 (MIRALAX PO), azathioprine, Adalimumab, and metoprolol succinate. We administered cyanocobalamin.  Meds ordered this encounter  Medications  . metoprolol succinate (TOPROL-XL) 25 MG 24 hr tablet    Sig: Take 1 tablet (25 mg total) by mouth 2 (two) times daily.    Dispense:  180 tablet    Refill:  1  . ferrous sulfate 325 (65 FE) MG tablet    Sig: Take 1 tablet (325 mg total) by mouth 2 (two) times daily with a meal.    Dispense:  180 tablet    Refill:  1  . Cholecalciferol 50000 units capsule    Sig: Take 1 capsule (50,000 Units total) by mouth once a week.    Dispense:  12 capsule    Refill:  1  . cyanocobalamin ((VITAMIN B-12)) injection 1,000 mcg     Follow-up: No follow-ups on file.  Scarlette Calico, MD

## 2018-03-24 MED FILL — HUMIRA PEN 40 MG/0.4ML PNKT: 40 | 28 days supply | Qty: 4 | Fill #2

## 2018-03-25 ENCOUNTER — Other Ambulatory Visit: Payer: Self-pay | Admitting: Internal Medicine

## 2018-03-25 DIAGNOSIS — E519 Thiamine deficiency, unspecified: Secondary | ICD-10-CM

## 2018-03-25 LAB — VITAMIN B1: Vitamin B1 (Thiamine): 7 nmol/L — ABNORMAL LOW (ref 8–30)

## 2018-03-25 LAB — THYROID PANEL WITH TSH
Free Thyroxine Index: 2.6 (ref 1.4–3.8)
T3 UPTAKE: 27 % (ref 22–35)
T4 TOTAL: 9.7 ug/dL (ref 5.1–11.9)
TSH: 0.69 mIU/L (ref 0.40–4.50)

## 2018-03-25 MED ORDER — VITAMIN B-1 50 MG PO TABS: 50.0000 mg | ORAL_TABLET | Freq: Every day | ORAL | 1 refills | Status: DC

## 2018-04-08 MED FILL — CARTIA XT 120 MG CAPSULE: 120 | 30 days supply | Qty: 30 | Fill #11

## 2018-04-09 ENCOUNTER — Telehealth: Payer: Self-pay | Admitting: Gastroenterology

## 2018-04-09 MED ORDER — CIPROFLOXACIN HCL 500 MG PO TABS: 500.0000 mg | ORAL_TABLET | Freq: Two times a day (BID) | ORAL | 0 refills | Status: AC

## 2018-04-09 MED ORDER — METRONIDAZOLE 500 MG PO TABS: 500.0000 mg | ORAL_TABLET | Freq: Two times a day (BID) | ORAL | 0 refills | Status: AC

## 2018-04-09 MED FILL — CIPROFLOXACIN HCL 500 MG TA: 500 | 10 days supply | Qty: 20 | Fill #0

## 2018-04-09 MED FILL — metroNIDAZOLE 500 MG TABS: 500 | 10 days supply | Qty: 20 | Fill #0

## 2018-04-09 NOTE — Telephone Encounter (Signed)
Perianal abscess that was opened 2-3 weeks ago by Dr. Johney Maine has closed back and his hurting, tender to touch.  Currently appt at [facility] next Tuesday.  Can you call [facility] to see if they can get her in today or tomorrow for care, lancing?  Also please call her in another course of abx (cipro 535m BID and flagyl 5073mBID both for 10 days).    Let her know about the appt, thanks

## 2018-04-09 NOTE — Telephone Encounter (Signed)
The pt has been advised. Prescription sent to the pharmacy and appt with [facility] is 04/10/18 at 2:30 pm to arrive at 2:15 pm

## 2018-04-10 DIAGNOSIS — K50113 Crohn's disease of large intestine with fistula: Secondary | ICD-10-CM | POA: Diagnosis not present

## 2018-04-10 DIAGNOSIS — K610 Anal abscess: Secondary | ICD-10-CM | POA: Diagnosis not present

## 2018-04-10 DIAGNOSIS — K50114 Crohn's disease of large intestine with abscess: Secondary | ICD-10-CM | POA: Diagnosis not present

## 2018-04-11 ENCOUNTER — Telehealth: Payer: Self-pay | Admitting: Gastroenterology

## 2018-04-11 NOTE — Telephone Encounter (Signed)
I reviewed an office note from Dr. Hillary Bow from yesterday.  He unroofed her superficial perianal abscess.  She has been on double Humira 80 mg every other week for 2 or 3 doses after several months of single dose Humira 40 mg every other week.  That medicine change was based on failure to clinically improve as well as drug level, antibody level testing.  I am starting to wonder whether Humira is working well enough for her even at the higher dose and I would like to change her to Remicade at usual 5 mg/kg dosing schedule including a typical load of 0 then 2 weeks then 6 weeks then every 8 weeks.  I will discuss this with her and make a decision after that conversation.

## 2018-04-14 ENCOUNTER — Telehealth: Payer: Self-pay | Admitting: Gastroenterology

## 2018-04-14 ENCOUNTER — Telehealth: Payer: Self-pay

## 2018-04-14 DIAGNOSIS — K50119 Crohn's disease of large intestine with unspecified complications: Secondary | ICD-10-CM

## 2018-04-14 DIAGNOSIS — K50114 Crohn's disease of large intestine with abscess: Secondary | ICD-10-CM

## 2018-04-14 NOTE — Telephone Encounter (Signed)
We are actually going to have the pt get her Remicade at [facility] short stay. Thank you

## 2018-04-14 NOTE — Telephone Encounter (Signed)
[facility] called regarding a referral they received today. They need pt's demographics and copy of insurance faxed over to [phone].

## 2018-04-14 NOTE — Telephone Encounter (Signed)
I reviewed an office note from Dr. Hillary Bow from yesterday.  He unroofed her superficial perianal abscess.  She has been on double Humira 80 mg every other week for 2 or 3 doses after several months of single dose Humira 40 mg every other week.  That medicine change was based on failure to clinically improve as well as drug level, antibody level testing.  I am starting to wonder whether Humira is working well enough for her even at the higher dose and I would like to change her to Remicade at usual 5 mg/kg dosing schedule including a typical load of 0 then 2 weeks then 6 weeks then every 8 weeks.  I will discuss this with her and make a decision after that conversation.

## 2018-04-14 NOTE — Telephone Encounter (Signed)
-----   Message from Milus Banister, MD sent at 04/14/2018  8:27 AM EST ----- See my phone note from last week. I think I forgot to route it to you.  She needs new start remicade 31m/kg.

## 2018-04-14 NOTE — Telephone Encounter (Signed)
Information sent to [facility] at [place]

## 2018-04-15 DIAGNOSIS — K50113 Crohn's disease of large intestine with fistula: Secondary | ICD-10-CM | POA: Diagnosis not present

## 2018-04-16 ENCOUNTER — Other Ambulatory Visit: Payer: Self-pay

## 2018-04-16 ENCOUNTER — Telehealth: Payer: Self-pay

## 2018-04-16 DIAGNOSIS — K50119 Crohn's disease of large intestine with unspecified complications: Secondary | ICD-10-CM

## 2018-04-16 NOTE — Telephone Encounter (Signed)
[place], Amy Merian Capron, Cricket Goodlin L, RN        She does not need precert. Long story behind this.    I have added the order to Epic and called [facility] short stay to set up Remicade infusion.  Was told that the scheduler is off and can't schedule any appt's today.  I will call back tomorrow.

## 2018-04-16 NOTE — Telephone Encounter (Signed)
-----   Message from Darden Dates sent at 04/16/2018 10:02 AM EST ----- Hey you can schedule Early Ch[facility]... Donnald Garre been all over the [place] with this case.  She does not need precert.  Long story behind this. Thanks, Amy ----- Message ----- From: Timothy Lasso, RN Sent: 04/14/2018  10:49 AM EST To: Darden Dates  Yes its fine I guess I will call [facility] short stay  ----- Message ----- From: Darden Dates Sent: 04/14/2018  10:39 AM EST To: Timothy Lasso, RN  Delorise Jackson, You have an order for Remicade for Geoffrey, please keep her with intentions on scheduling at the hospital.  There is issues with [place] sending employees to [place] not covering med when done there.  Colletta [place] told me it has to do with [facility] wanting employees to stay at their facilities. Let me know if ok to start precert for [facility].  I can start and try to get done asap.  I will be out half day Thurs and all day Fri Thanks, Amy

## 2018-04-16 NOTE — Progress Notes (Signed)
The pt needs remicade for Crohn's at 0,2,6 and 8 weeks.  TB quantiferon negative on  10/07/17.

## 2018-04-17 ENCOUNTER — Other Ambulatory Visit (HOSPITAL_COMMUNITY): Payer: Self-pay | Admitting: Cardiology

## 2018-04-17 MED FILL — ELIQUIS 5 MG TABLET: 5 | 30 days supply | Qty: 60 | Fill #0

## 2018-04-17 NOTE — Telephone Encounter (Signed)
Pt last saw Dr Curt Bears 04/22/17, last labs 02/04/18 Creat 0.73, age 62, weight 69.9kg, based on specified criteria pt is on appropriate dosage of Eliquis.  Will refill rx.

## 2018-04-17 NOTE — Telephone Encounter (Signed)
appt for remicade at [facility] short stay is 05/05/18 at 730 am, 05/19/18 at 8 am, and 06/16/18 at 8 am.  Pt advised.

## 2018-04-18 ENCOUNTER — Other Ambulatory Visit: Payer: Self-pay | Admitting: Cardiology

## 2018-04-21 MED FILL — azaTHIOprine 50 MG TABS: 50 | 30 days supply | Qty: 60 | Fill #6

## 2018-04-22 ENCOUNTER — Other Ambulatory Visit: Payer: 59

## 2018-04-22 ENCOUNTER — Telehealth: Payer: Self-pay | Admitting: Gastroenterology

## 2018-04-22 ENCOUNTER — Other Ambulatory Visit: Payer: Self-pay

## 2018-04-22 DIAGNOSIS — K50119 Crohn's disease of large intestine with unspecified complications: Secondary | ICD-10-CM

## 2018-04-22 MED ORDER — PEG 3350-KCL-NA BICARB-NACL 420 G PO SOLR: 4000.0000 mL | Freq: Once | ORAL | 0 refills | Status: AC

## 2018-04-22 MED FILL — PEG-3350 AND ELECTROLYTES S: 236 | 1 days supply | Qty: 4000 | Fill #0

## 2018-04-22 NOTE — Telephone Encounter (Signed)
I discussed her case with Dr. Hilarie Fredrickson today.   Needs: humira drug level, humira antibody today (before her afternoon humira dose.  colonoscoyp 04/2017 at 8am (she'll need to get more details on this (ok to do it on her blood thinner).  May postpone her remicade start depending on the drug level results (do not try to get her a sooner first infusion for now).  thanks

## 2018-04-22 NOTE — Telephone Encounter (Signed)
The pt has been advised of colon and instructed.  Her labs have also been added to Epic.

## 2018-04-23 ENCOUNTER — Other Ambulatory Visit: Payer: Self-pay

## 2018-04-23 DIAGNOSIS — K50119 Crohn's disease of large intestine with unspecified complications: Secondary | ICD-10-CM

## 2018-04-28 LAB — ADALIMUMAB+AB (SERIAL MONITOR)
Adalimumab Drug Level: 6.6 ug/mL
Anti-Adalimumab Antibody: <25 ng/mL

## 2018-04-28 LAB — SERIAL MONITORING

## 2018-04-30 ENCOUNTER — Other Ambulatory Visit: Payer: Self-pay

## 2018-04-30 DIAGNOSIS — K50119 Crohn's disease of large intestine with unspecified complications: Secondary | ICD-10-CM

## 2018-04-30 MED ORDER — AZATHIOPRINE 100 MG PO TABS: 150.0000 mg | ORAL_TABLET | Freq: Every day | ORAL | 0 refills | Status: AC

## 2018-04-30 MED FILL — azaTHIOprine 50 MG TABS: 50 | 30 days supply | Qty: 90 | Fill #0

## 2018-05-02 ENCOUNTER — Ambulatory Visit: Payer: 59 | Admitting: Gastroenterology

## 2018-05-02 ENCOUNTER — Ambulatory Visit (INDEPENDENT_AMBULATORY_CARE_PROVIDER_SITE_OTHER): Payer: 59 | Admitting: Gastroenterology

## 2018-05-02 ENCOUNTER — Encounter: Payer: Self-pay | Admitting: Gastroenterology

## 2018-05-02 VITALS — BP 120/60 | HR 84

## 2018-05-02 DIAGNOSIS — K50119 Crohn's disease of large intestine with unspecified complications: Secondary | ICD-10-CM

## 2018-05-02 DIAGNOSIS — K50114 Crohn's disease of large intestine with abscess: Secondary | ICD-10-CM

## 2018-05-02 DIAGNOSIS — R9389 Abnormal findings on diagnostic imaging of other specified body structures: Secondary | ICD-10-CM

## 2018-05-02 DIAGNOSIS — R1084 Generalized abdominal pain: Secondary | ICD-10-CM | POA: Diagnosis not present

## 2018-05-02 MED ORDER — CIPROFLOXACIN HCL 500 MG PO TABS: 500.0000 mg | ORAL_TABLET | Freq: Two times a day (BID) | ORAL | 0 refills | Status: DC

## 2018-05-02 MED ORDER — METRONIDAZOLE 500 MG PO TABS: 500.0000 mg | ORAL_TABLET | Freq: Two times a day (BID) | ORAL | 0 refills | Status: DC

## 2018-05-02 MED ORDER — HYDROCODONE-ACETAMINOPHEN 7.5-325 MG PO TABS: 1.0000 | ORAL_TABLET | Freq: Four times a day (QID) | ORAL | 0 refills | Status: DC | PRN

## 2018-05-02 MED FILL — HYDROCODON-APAP 7.5-325: 7.5-325 | 15 days supply | Qty: 60 | Fill #0

## 2018-05-02 MED FILL — metroNIDAZOLE 500 MG TABS: 500 | 14 days supply | Qty: 28 | Fill #0

## 2018-05-02 MED FILL — CIPROFLOXACIN HCL 500 MG TA: 500 | 14 days supply | Qty: 28 | Fill #0

## 2018-05-02 NOTE — Patient Instructions (Addendum)
We have sent the following medications to your pharmacy for you to pick up at your convenience:  Cipro 529m: Take twice a day for 2 weeks Flagyl 501m Take twice a day for 2 weeks  We have given you a printed prescription for Norco 7.5 / 325 mg: Take 1 to 2 tablets by mouth every 6 hours as needed for pain.   [facility] at 11:00am. Please arrive 30 minutes prior to your appointment time for registration purposes. Please make certain not to have anything to eat or drink 6 hours prior to your test. In addition, if you have any metal in your body, have a pacemaker or defibrillator, please be sure to let your ordering physician know. This test typically takes 45 minutes to 1 hour to complete. Should you need to reschedule, please call [phone] We have requested an appointment for you to see Dr. WhDema Severint CCElkinsSomeone will let you know when that appointment has been scheduled.

## 2018-05-02 NOTE — Progress Notes (Signed)
Review of pertinent gastrointestinal problems: 1.Crohn's disease:Crohn's colitis withsevereperianal disease.No evidence of small bowel disease on5/2019 CT enterography. Colonoscopy 09/2017 showed normal terminal ileum, patchy deep ulcers segmentally throughout her colon, severe perianal disease with fistulas and early abscess that was treated byoral Abx,debridement, seton placement 09/2017, Dr. Johney Maine.Colonoscopy 12/2012 for average risk screening; incidental inflammation right colon (path suggested possible IBD) was attributed to NSAID use at the time.  Labs 09/2017: TB quant gold negative, Hepatitis B Surface Ab negative, Surface Ag negative, Hep C Ab negative; TPMT activity level NORMAL.  Dual therapy with immunomodulators(azathiaprine)and biologic(humira)started 09/2017.  Poor response of perianal disease led to drug level testing (01/2018) adalimumab was 5.6 (low) at trough, no antibodies detected.  Humira increased to 56m every other week.  Continued poor perianal response led to repeat drug testing 03/2018) adalimumab trough level was 6.8 (still lower than ideal) with no antibodies detected.  03/2018 Changed humira dosing to 448monce weekly, also increased azathiaprine to 15072mnce daily.    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  1 or 2 weeks ago [facility]  She is feeling overall worse.  Her bottom is very uncomfortable.  She has not had any fevers or chills but she has a lot of discomfort, pain that her bottom.  She has drainage nearly constantly from several sites.  She has no abdominal pains.  She has semi-formed stools once or twice a day without urgency or overt bleeding.  ROS: complete  GI ROS as described in HPI, all other review negative.  Constitutional:  No unintentional weight loss   Past Medical History:  Diagnosis Date  . Anal fistula   . Anticoagulant long-term use    eliquis  . [facility]  [facility] dx 05/ 2019 by dr [facility]dis [facility])  . Heart murmur   . Hemorrhoids    internal and external  . History of hidradenitis suppurativa    axilla  . Hyperlipidemia   . Hypertension   . [facility]  [facility]-  dr camCurt Bears first dx 07-18-2015  . Perianal Crohn's disease, with abscess ([facility]/14/2019  . Rectal pain   . Uterine fibroid    per MRI 09-16-2017  . Wears glasses     Past Surgical History:  Procedure Laterality Date  . CESAREAN SECTION  x2  last one 1996  . COLONOSCOPY WITH PROPOFOL  last one 09-27-2017  dr d. [facility]acArdis [facility]  . INCISION AND DRAINAGE ABSCESS N/A 10/11/2017   Procedure: DRAINAGE OF PERIRECTAL ABSCESSES;  Surgeon: GroMichael BostonD;  [facility] [facility];  Laterality: N/A;  . PLACEMENT OF SETON N/A 10/11/2017   Procedure: PLACEMENT OF SETON;  Surgeon: GroMichael BostonD;  Location: [facility]: General;  Laterality: N/A;  . RECTAL EXAM UNDER ANESTHESIA N/A 10/11/2017   Procedure: ANORECTAL EXAM UNDER ANESTHESIA;  Surgeon: GroMichael BostonD;  Location: WES[facility]ailService: General;  Laterality: N/A;  . TRANSTHORACIC ECHOCARDIOGRAM  10/08/2012   ef 50-55%,  grade 1 diastolic dysfunction/  trivial MR and TR/  mild LAE    Current Outpatient Medications  Medication Sig  Dispense Refill  . Adalimumab (HUMIRA PEN) 40 MG/0.4ML PNKT Inject 80 mg into the skin every 14 (fourteen) days for 6 days. 4 each 6  . azathioprine (IMURAN) 100 MG tablet Take 1.5 tablets (150 mg total) by mouth daily. 45 tablet 0  . Cholecalciferol 50000 units capsule Take 1 capsule (50,000 Units total) by mouth once a week. 12  capsule 1  . ciprofloxacin (CIPRO) 500 MG tablet Take 1 tablet (500 mg total) by mouth 2 (two) times daily. 28 tablet 0  . diltiazem (CARTIA XT) 120 MG 24 hr capsule Take 1 capsule (120 mg total) by mouth daily. Please call to schedule yearly appt. Thanks! (1st attempt) 30 capsule 0  . ELIQUIS 5 MG TABS tablet TAKE 1 TABLET BY MOUTH TWICE DAILY 60 tablet 5  . ferrous sulfate 325 (65 FE) MG tablet Take 1 tablet (325 mg total) by mouth 2 (two) times daily with a meal. 180 tablet 1  . flecainide (TAMBOCOR) 150 MG tablet Take 2 tablets (300 mg total) by mouth daily as needed (for Atrial Fibrillation). 10 tablet 1  . HYDROcodone-acetaminophen (NORCO) 7.5-325 MG tablet Take 1 tablet by mouth every 6 (six) hours as needed for moderate pain. 60 tablet 0  . metoprolol succinate (TOPROL-XL) 25 MG 24 hr tablet Take 1 tablet (25 mg total) by mouth 2 (two) times daily. 180 tablet 1  . metroNIDAZOLE (FLAGYL) 500 MG tablet Take 1 tablet (500 mg total) by mouth 2 (two) times daily. 28 tablet 0  . Polyethylene Glycol 3350 (MIRALAX PO) Take by mouth daily.    Marland Kitchen thiamine (VITAMIN B-1) 50 MG tablet Take 1 tablet (50 mg total) by mouth daily. 90 tablet 1   No current facility-administered medications for this visit.    Facility-Administered Medications Ordered in Other Visits  Medication Dose Route Frequency Provider Last Rate Last Dose  . Chlorhexidine Gluconate Cloth 2 % PADS 6 each  6 each Topical Once Michael Boston, MD       And  . Chlorhexidine Gluconate Cloth 2 % PADS 6 each  6 each Topical Once Michael Boston, MD        Allergies as of 05/02/2018 - Review Complete 05/02/2018  Allergen Reaction Noted  . Latex Rash 10/10/2017  . Lovastatin  08/11/2012  . Protonix [pantoprazole] Diarrhea 10/10/2017  . Adhesive [tape] Rash 10/10/2017  . Penicillins Rash 08/11/2012    Family History  Problem Relation Age of Onset  . Stroke Mother   . Hypertension Mother   . Hyperlipidemia Mother   . Thyroid disease  Mother   . Cancer Father        prostate  . Colon cancer Maternal Aunt   . Diabetes Neg Hx   . Depression Neg Hx   . Early death Neg Hx   . Hearing loss Neg Hx   . Heart disease Neg Hx   . Kidney disease Neg Hx   . Alcohol abuse Neg Hx   . Breast cancer Neg Hx   . Esophageal cancer Neg Hx   . Stomach cancer Neg Hx   . Rectal cancer Neg Hx     Social History   Socioeconomic History  . Marital status: Married    Spouse name: Not on file  . Number of children: Not on file  . Years of education: Not on file  . Highest education level: Not on file  Occupational History  . Occupation: registered Optician, dispensing: [employer]  .  Financial resource strain: Not on file  . Food insecurity:    Worry: Not on file    Inability: Not on file  . Transportation needs:    Medical: Not on file    Non-medical: Not on file  Tobacco Use  . Smoking status: Never Smoker  . Smokeless tobacco: Never Used  Substance and Sexual Activity  . Alcohol use: No  . Drug use: No  . Sexual activity: Not on file  Lifestyle  . Physical activity:    Days per week: Not on file    Minutes per session: Not on file  . Stress: Not on file  Relationships  . Social connections:    Talks on phone: Not on file    Gets together: Not on file    Attends religious service: Not on file    Active member of club or organization: Not on file    Attends meetings of clubs or organizations: Not on file    Relationship status: Not on file  . Intimate partner violence:    Fear of current or ex partner: Not on file    Emotionally abused: Not on file    Physically abused: Not on file    Forced sexual activity: Not on file  Other Topics Concern  . Not on file  Social History Narrative  . Not on file     Physical Exam: BP 120/60   Pulse 84  Constitutional: generally well-appearing Psychiatric: alert and oriented x3 Abdomen: soft, nontender, nondistended, no obvious ascites, no peritoneal signs,  normal bowel sounds No peripheral edema noted in lower extremities Rectal examination both by me and also with Dr. Hilarie Fredrickson in the room: Her bottom, perianal region looks worse than when I had previously seen it about 2 months ago.  She is draining purulent liquid from several sites both on the right and the left of her anus.  She has some fluctuance, tenderness throughout.  I count at least 7 or 8 openings perianally.  Assessment and plan: 62 y.o. female with Crohn's disease, colitis and severe perianal disease.  I have made to medical changes in the past week.  Specifically she is changing from Humira 80 mg every other week to 40 mg every week.  This is based on her trough levels and the fact that she does not have any antibodies on repeated testing.  I suspect she might be a quick metabolizer of the Humira and so once weekly dosing hopefully will keep her at a nice her therapeutic range.  I plan to check trough levels again in 3 or 4 weeks.  Azathioprine has been increased from 100 mg a day to 150 mg/day.  I have discussed her case with 1 of my colleagues, Dr. Hilarie Fredrickson, and he also examined her in the office today.   I was very happy for his input on her challenging situation.  Her perianal disease is getting worse, not better.  I am putting her back on antibiotics, Cipro Flagyl both for 2 weeks.  Hydrocodone/acet for pain (60 pills).  She will get a repeat pelvic MRI over the next 2 or 3 days to help define fistulous tracts, infection in her pelvis.  I have asked Dr. Dema Severin from [facility] surgery to evaluate her and he has graciously agreed to see her hopefully sometime within the next week or so.  Please see the "Patient Instructions" section for addition details about the plan.  Owens Loffler, MD [facility] 05/02/2018, 11:45 AM

## 2018-05-04 ENCOUNTER — Ambulatory Visit (HOSPITAL_COMMUNITY)
Admission: RE | Admit: 2018-05-04 | Discharge: 2018-05-04 | Disposition: A | Discharge status: Home / Self Care | Payer: 59 | Source: Ambulatory Visit | Attending: Gastroenterology | Admitting: Gastroenterology

## 2018-05-04 DIAGNOSIS — R1084 Generalized abdominal pain: Secondary | ICD-10-CM | POA: Insufficient documentation

## 2018-05-04 DIAGNOSIS — K50114 Crohn's disease of large intestine with abscess: Secondary | ICD-10-CM | POA: Insufficient documentation

## 2018-05-04 DIAGNOSIS — R9389 Abnormal findings on diagnostic imaging of other specified body structures: Secondary | ICD-10-CM | POA: Insufficient documentation

## 2018-05-04 DIAGNOSIS — K50119 Crohn's disease of large intestine with unspecified complications: Secondary | ICD-10-CM | POA: Insufficient documentation

## 2018-05-05 ENCOUNTER — Encounter (HOSPITAL_COMMUNITY): Payer: 59

## 2018-05-05 ENCOUNTER — Ambulatory Visit (HOSPITAL_COMMUNITY): Payer: 59

## 2018-05-05 ENCOUNTER — Telehealth: Payer: Self-pay | Admitting: Gastroenterology

## 2018-05-05 ENCOUNTER — Other Ambulatory Visit (INDEPENDENT_AMBULATORY_CARE_PROVIDER_SITE_OTHER): Payer: 59

## 2018-05-05 DIAGNOSIS — K50114 Crohn's disease of large intestine with abscess: Secondary | ICD-10-CM

## 2018-05-05 LAB — CBC WITH DIFFERENTIAL/PLATELET
BASOS PCT: 0.7 % (ref 0.0–3.0)
Basophils Absolute: 0.1 10*3/uL (ref 0.0–0.1)
EOS PCT: 1.7 % (ref 0.0–5.0)
Eosinophils Absolute: 0.2 10*3/uL (ref 0.0–0.7)
HCT: 31.9 % — ABNORMAL LOW (ref 36.0–46.0)
Hemoglobin: 10.2 g/dL — ABNORMAL LOW (ref 12.0–15.0)
Lymphocytes Relative: 7.9 % — ABNORMAL LOW (ref 12.0–46.0)
Lymphs Abs: 1 10*3/uL (ref 0.7–4.0)
MCHC: 32 g/dL (ref 30.0–36.0)
MCV: 89.8 fl (ref 78.0–100.0)
Monocytes Absolute: 0.8 10*3/uL (ref 0.1–1.0)
Monocytes Relative: 6.1 % (ref 3.0–12.0)
Neutro Abs: 10.8 10*3/uL — ABNORMAL HIGH (ref 1.4–7.7)
Neutrophils Relative %: 83.6 % — ABNORMAL HIGH (ref 43.0–77.0)
PLATELETS: 576 10*3/uL — AB (ref 150.0–400.0)
RBC: 3.56 Mil/uL — ABNORMAL LOW (ref 3.87–5.11)
RDW: 15.4 % (ref 11.5–15.5)
WBC: 12.9 10*3/uL — ABNORMAL HIGH (ref 4.0–10.5)

## 2018-05-05 LAB — COMPREHENSIVE METABOLIC PANEL
ALT: 3 U/L (ref 0–35)
AST: 10 U/L (ref 0–37)
Albumin: 3.8 g/dL (ref 3.5–5.2)
Alkaline Phosphatase: 60 U/L (ref 39–117)
BUN: 14 mg/dL (ref 6–23)
CO2: 26 mEq/L (ref 19–32)
Calcium: 9.2 mg/dL (ref 8.4–10.5)
Chloride: 102 mEq/L (ref 96–112)
Creatinine, Ser: 0.65 mg/dL (ref 0.40–1.20)
GFR: 118.73 mL/min (ref >60.00)
GLUCOSE: 101 mg/dL — AB (ref 70–99)
POTASSIUM: 3.9 meq/L (ref 3.5–5.1)
Sodium: 138 mEq/L (ref 135–145)
Total Bilirubin: 0.3 mg/dL (ref 0.2–1.2)
Total Protein: 8 g/dL (ref 6.0–8.3)

## 2018-05-05 NOTE — Telephone Encounter (Signed)
Message sent to [facility] to inquire about STAT appt.  Labs and MRI added to Epic   You have been scheduled for an MRI at [facility] on 05/05/18. Your appointment time is 8 pm. Please arrive 30 minutes prior to your appointment time for registration purposes. Please make certain not to have anything to eat or drink 4 hours prior to your test. In addition, if you have any metal in your body, have a pacemaker or defibrillator, please be sure to let your ordering physician know. This test typically takes 45 minutes to 1 hour to complete. Should you need to reschedule, please call [phone] to do so.

## 2018-05-05 NOTE — Telephone Encounter (Signed)
Appt made at [facility] due to pt preference for 05/06/18 at 1 pm.  The pt has been advised and instructed.  She will have labs today.

## 2018-05-05 NOTE — Telephone Encounter (Signed)
She showed up for pelvic MRI yesterday (Sunday) but was turned away because Cr last check was 3 months ago and they did not have ability to draw labs on the weekend.  It was a huge inconvenience for her since she drove from [place] just to have the test.  Can you get her pelvic MRI (stat order) today or tomorrow.  She needs labs checked this morning;  cmet, cbc, also adalimumab level but does not need a repeat adalimumab antibody check.  Thanks

## 2018-05-06 ENCOUNTER — Ambulatory Visit (HOSPITAL_COMMUNITY)
Admission: RE | Admit: 2018-05-06 | Discharge: 2018-05-06 | Disposition: A | Discharge status: Home / Self Care | Payer: 59 | Source: Ambulatory Visit | Attending: Gastroenterology | Admitting: Gastroenterology

## 2018-05-06 DIAGNOSIS — K603 Anal fistula: Secondary | ICD-10-CM | POA: Insufficient documentation

## 2018-05-06 DIAGNOSIS — K5090 Crohn's disease, unspecified, without complications: Secondary | ICD-10-CM | POA: Diagnosis not present

## 2018-05-06 DIAGNOSIS — K50114 Crohn's disease of large intestine with abscess: Secondary | ICD-10-CM | POA: Diagnosis not present

## 2018-05-06 MED ORDER — GADOBUTROL 1 MMOL/ML IV SOLN
10.0000 mL | Freq: Once | INTRAVENOUS | Status: AC | PRN
  Administered 2018-05-06: 7 mL via INTRAVENOUS

## 2018-05-06 MED FILL — CARTIA XT 120 MG CP24: 120 | 30 days supply | Qty: 30 | Fill #0

## 2018-05-07 ENCOUNTER — Other Ambulatory Visit: Payer: Self-pay

## 2018-05-07 MED ORDER — HYDROMORPHONE HCL 2 MG PO TABS: 2.0000 mg | ORAL_TABLET | ORAL | 0 refills | Status: DC | PRN

## 2018-05-07 MED ORDER — METRONIDAZOLE 500 MG PO TABS: 500.0000 mg | ORAL_TABLET | Freq: Two times a day (BID) | ORAL | 0 refills | Status: AC

## 2018-05-07 MED ORDER — CIPROFLOXACIN HCL 500 MG PO TABS: 500.0000 mg | ORAL_TABLET | Freq: Two times a day (BID) | ORAL | 0 refills | Status: AC

## 2018-05-07 MED FILL — HYDROmorphone HCL 2 MG TABS: 2 | 5 days supply | Qty: 60 | Fill #0

## 2018-05-09 LAB — ADALIMUMAB LEVEL FOR IBD: ADALIMUMAB LEVEL,IBD: 7.2 ug/mL

## 2018-05-13 ENCOUNTER — Other Ambulatory Visit: Payer: Self-pay

## 2018-05-13 DIAGNOSIS — K50114 Crohn's disease of large intestine with abscess: Secondary | ICD-10-CM

## 2018-05-13 MED FILL — metroNIDAZOLE 500 MG TABS: 500 | 14 days supply | Qty: 28 | Fill #0

## 2018-05-13 MED FILL — CIPROFLOXACIN HCL 500 MG TA: 500 | 14 days supply | Qty: 28 | Fill #0

## 2018-05-14 ENCOUNTER — Encounter: Payer: Self-pay | Admitting: Gastroenterology

## 2018-05-14 ENCOUNTER — Ambulatory Visit (AMBULATORY_SURGERY_CENTER): Payer: 59 | Admitting: Gastroenterology

## 2018-05-14 VITALS — BP 148/79 | HR 67 | Temp 97.5°F | Resp 12 | Ht 64.0 in | Wt 154.0 lb

## 2018-05-14 DIAGNOSIS — K5090 Crohn's disease, unspecified, without complications: Secondary | ICD-10-CM | POA: Diagnosis not present

## 2018-05-14 DIAGNOSIS — K50119 Crohn's disease of large intestine with unspecified complications: Secondary | ICD-10-CM | POA: Diagnosis not present

## 2018-05-14 DIAGNOSIS — K529 Noninfective gastroenteritis and colitis, unspecified: Secondary | ICD-10-CM | POA: Diagnosis not present

## 2018-05-14 MED ORDER — SODIUM CHLORIDE 0.9 % IV SOLN: 500.0000 mL | INTRAVENOUS | Status: DC

## 2018-05-14 NOTE — Progress Notes (Signed)
Called to room to assist during endoscopic procedure.  Patient ID and intended procedure confirmed with present staff. Received instructions for my participation in the procedure from the performing physician.  

## 2018-05-14 NOTE — Patient Instructions (Addendum)
Continue with plans to switch to Remicade at 79m/kg (usual week 0,2,6 induction then every 8 week). Will plan on checking Remicade trough drug level after your first 8 week infusion.  Stay on humira 431monce weekly until the first dose of Remicade. Continue azathiaprine at 15091maily dosing. Continue cipro/flagyl for another 2 weeks and then will start long term monotherapy antibiotic. CCSurgery follow up closely. Continue Eliquis.  [facility]   Refer to the procedure report that was given to you for any specific questions about what was found during the examination.  If the procedure report does not answer your questions, please call your gastroenterologist to clarify.  If you requested that your care partner not be given the details of your procedure findings, then the procedure report has been included in a sealed envelope for you to review at your convenience later.  YOU SHOULD EXPECT: Some feelings of bloating in the abdomen. Passage of more gas than usual.  Walking can help get rid of the air that was put into your GI tract during the procedure and reduce the bloating. If you had a lower endoscopy (such as a colonoscopy or flexible sigmoidoscopy) you may notice spotting of blood in your stool or on the toilet paper. If you underwent a bowel prep for your procedure, you may not have a normal bowel movement for a few days.  Please Note:  You might notice some irritation and congestion in your nose or some drainage.  This is from the oxygen used during your procedure.  There is no need for concern and it should clear up in a day or so.  SYMPTOMS TO REPORT IMMEDIATELY:   Following lower endoscopy (colonoscopy or flexible sigmoidoscopy):  Excessive amounts of blood in the stool  Significant tenderness or worsening of abdominal pains  Swelling of the abdomen that is new, acute  Fever of 100F or higher   For urgent or emergent  issues, a gastroenterologist can be reached at any hour by calling ([phone] DIET:  We do recommend a small meal at first, but then you may proceed to your regular diet.  Drink plenty of fluids but you should avoid alcoholic beverages for 24 hours.  ACTIVITY:  You should plan to take it easy for the rest of today and you should NOT DRIVE or use heavy machinery until tomorrow (because of the sedation medicines used during the test).    FOLLOW UP: Our staff will call the number listed on your records the next business day following your procedure to check on you and address any questions or concerns that you may have regarding the information given to you following your procedure. If we do not reach you, we will leave a message.  However, if you are feeling well and you are not experiencing any problems, there is no need to return our call.  We will assume that you have returned to your regular daily activities without incident.  If any biopsies were taken you will be contacted by phone or by letter within the next 1-3 weeks.  Please call us [place] ([phone] you have not heard about the biopsies in 3 weeks.    SIGNATURES/CONFIDENTIALITY: You and/or your care partner have signed paperwork which will be entered into your electronic medical record.  These signatures attest to the fact that that the information above on your After Visit Summary has been reviewed and is understood.  Full responsibility of the confidentiality of this discharge information lies with you and/or your care-partner.

## 2018-05-14 NOTE — Progress Notes (Signed)
PT taken to PACU. Monitors in place. VSS. Report given to RN. 

## 2018-05-14 NOTE — Op Note (Addendum)
[facility] Patient Name: Julie Clark Procedure Date: 05/14/2018 7:41 AM MRN: [id number] Endoscopist: Milus Banister , MD Age: 62 Referring MD:  Date of Birth: [birth year] Gender: Female Account #: [id number] Procedure:                Colonoscopy Indications:              High risk colon cancer surveillance: Inflammatory                            bowel disease; perianal disease not improving                            despite several humira dosing regimens (58m every                            other week, then 830mevery other week, lately 4082m                          once a week) Medicines:                Monitored Anesthesia Care Procedure:                Pre-Anesthesia Assessment:                           - Prior to the procedure, a History and Physical                            was performed, and patient medications and                            allergies were reviewed. The patient's tolerance of                            previous anesthesia was also reviewed. The risks                            and benefits of the procedure and the sedation                            options and risks were discussed with the patient.                            All questions were answered, and informed consent                            was obtained. Prior Anticoagulants: The patient has                            taken Eliquis (apixaban). ASA Grade Assessment: II                            - A patient with mild systemic disease. After  reviewing the risks and benefits, the patient was                            deemed in satisfactory condition to undergo the                            procedure.                           After obtaining informed consent, the colonoscope                            was passed under direct vision. Throughout the                            procedure, the patient's blood pressure, pulse, and                             oxygen saturations were monitored continuously. The                            Colonoscope was introduced through the anus with                            the intention of advancing to the ileum. The scope                            was advanced to the ascending colon before the                            procedure was aborted. Medications were given. The                            colonoscopy was performed without difficulty. The                            patient tolerated the procedure well. The quality                            of the bowel preparation was good. The rectum was                            photographed. Scope In: 8:11:25 AM Scope Out: 8:28:55 AM Scope Withdrawal Time: 0 hours 14 minutes 4 seconds  Total Procedure Duration: 0 hours 17 minutes 30 seconds  Findings:                 This was an incomplete examination, I was unable to                            advance the colonoscope proximal to a 5-63m                            ulcerated, focal stricture in (presumably) the  ascending colon. Biopsies taken (jar 1).                           Segmental severe inflammation (4-5cm long segment                            in presumably the transverse colon or hepatic                            flexure, about 10cm distal to the tight focal                            stricture above. Biopsies taken (jar 2).                           Remaining colon mucosa was normal appearing until                            reaching the perianus. The anal canal was                            ulcerated, circumferentially.                           Externally, the perianus was a bit improved vs.                            office evaluation 10 days ago (left sided seton                            still present, several area on right and left                            gluteal folds of induration and drainage).                           The exam was otherwise without  abnormality on                            direct and retroflexion views. Complications:            No immediate complications. Estimated blood loss:                            None. Estimated Blood Loss:     Estimated blood loss: none. 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                           -  Severe perianal disease that has also not                            improved in the same interval.                           - The examination was otherwise normal on direct                            and retroflexion views. Recommendation:           - Patient has a contact number available for                            emergencies. The signs and symptoms of potential                            delayed complications were discussed with the                            patient. Return to normal activities tomorrow.                            Written discharge instructions were provided to the                            patient.                           - Resume previous diet.                           - Continue present medications.                           - Await pathology results.                           - Continue with plans to switch to Remicade at                            19m/kg (usual week 0, 2, 6 induction and then every                            8 week). Will plan on checking remicade trough drug                            level after your first 8 week infusion. Stay on                            humira 423monce weekly until the first dose of                            remicade. Continue azathiaprine at 15060maily  dosing.                           - Continue cipro/flagyl for another 2 weeks and                            then will start long term monotherapy antibiotic.                           -  [facility]gery follow up closely. Milus Banister, MD 05/14/2018 8:44:26 AM This report has been signed electronically.

## 2018-05-15 ENCOUNTER — Telehealth: Payer: Self-pay | Admitting: *Deleted

## 2018-05-15 ENCOUNTER — Telehealth: Payer: Self-pay

## 2018-05-15 ENCOUNTER — Other Ambulatory Visit (HOSPITAL_COMMUNITY): Payer: Self-pay | Admitting: General Practice

## 2018-05-15 MED FILL — ELIQUIS 5 MG TABLET: 5 | 30 days supply | Qty: 60 | Fill #1

## 2018-05-15 NOTE — Telephone Encounter (Signed)
-----   Message from Fransico Setters, [place] sent at 05/15/2018 10:23 AM EST ----- Regarding: RE: Remicade Contact: [phone] UYW SBBJX. I have scheduled Ms. Julie Clark for:  June 02, 2018 @ 5369 for week 0  June 16, 2018 @ 1130 for week 2  July 14, 2018 @ 0800 for week 6, then every 8 weeks thereafter.   Let me know if this works either way or not for the patient. I also need the Patient's Diagnosis, and which 54 her care will be under if this works, and please confirm that the patient will be notified and orders will be placed please my friend. Thank you as always and apologize that I missed your call.  I will be here until 2 pm today , if you need to call me. 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  I left you a message on your voice mail.  You can call me if you need to at [phone].  Thank you sweetness!! ----- Message ----- From: Timothy Lasso, RN Sent: 05/14/2018 To: Timothy Lasso, RN  Call [facility] for remicade [phone]

## 2018-05-15 NOTE — Telephone Encounter (Signed)
The patient has been notified of this information and all questions answered. The pt has been advised of the information and verbalized understanding.    

## 2018-05-15 NOTE — Telephone Encounter (Signed)
-----   Message from Fransico Setters, [place] sent at 05/15/2018 10:23 AM EST ----- Regarding: RE: Remicade Contact: [phone] RCB ULAGT. I have scheduled Ms. Gerarda Fraction for:  June 02, 2018 @ 3646 for week 0  June 16, 2018 @ 1130 for week 2  July 14, 2018 @ 0800 for week 6, then every 8 weeks thereafter.   Let me know if this works either way or not for the patient. I also need the Patient's Diagnosis, and which 12 her care will be under if this works, and please confirm that the patient will be notified and orders will be placed please my friend. Thank you as always and apologize that I missed your call.  I will be here until 2 pm today , if you need to call me. 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  I left you a message on your voice mail.  You can call me if you need to at [phone].  Thank you sweetness!! ----- Message ----- From: Timothy Lasso, RN Sent: 05/14/2018 To: Timothy Lasso, RN  Call [facility] for remicade [phone]

## 2018-05-15 NOTE — Telephone Encounter (Signed)
  Follow up Call-  Call back number 09/27/2017  Post procedure Call Back phone  # [phone]  Permission to leave phone message Yes  Some recent data might be hidden     Patient questions:  Do you have a fever, pain , or abdominal swelling? No. Pain Score  0 *  Have you tolerated food without any problems? Yes.    Have you been able to return to your normal activities? Yes.    Do you have any questions about your discharge instructions: Diet   No. Medications  No. Follow up visit  No.  Do you have questions or concerns about your Care? No.  Actions: * If pain score is 4 or above: No action needed, pain <4.

## 2018-05-19 ENCOUNTER — Encounter (HOSPITAL_COMMUNITY): Payer: 59

## 2018-05-20 ENCOUNTER — Ambulatory Visit: Payer: 59 | Admitting: Gastroenterology

## 2018-05-26 ENCOUNTER — Other Ambulatory Visit: Payer: Self-pay

## 2018-05-26 DIAGNOSIS — K50119 Crohn's disease of large intestine with unspecified complications: Secondary | ICD-10-CM

## 2018-05-29 ENCOUNTER — Telehealth: Payer: Self-pay | Admitting: Gastroenterology

## 2018-05-29 MED ORDER — METRONIDAZOLE 500 MG PO TABS: 500.0000 mg | ORAL_TABLET | Freq: Two times a day (BID) | ORAL | 1 refills | Status: AC

## 2018-05-29 MED FILL — metroNIDAZOLE 500 MG TABS: 500 | 30 days supply | Qty: 60 | Fill #0

## 2018-05-29 NOTE — Telephone Encounter (Signed)
Can you please call in flagyl 557m pills, one pill twice daily, 1 month supply with one refill.  Let her know that she needs to start this after her cipro/flagyl course is done (later this week, I believe).  Thanks

## 2018-05-29 NOTE — Telephone Encounter (Signed)
Pt aware and will start flagyl after she finishes her current course.

## 2018-05-29 NOTE — Telephone Encounter (Signed)
Prescription sent and call placed to pt no answer and no voice mail.

## 2018-05-29 NOTE — Telephone Encounter (Signed)
Spoke to [employer] from [facility] to confirm Flagyl order.

## 2018-05-29 NOTE — Telephone Encounter (Signed)
Delilah Shan from [facility] calling to verify an e-script.

## 2018-05-30 ENCOUNTER — Other Ambulatory Visit: Payer: Self-pay

## 2018-05-30 ENCOUNTER — Other Ambulatory Visit (HOSPITAL_COMMUNITY): Payer: Self-pay | Admitting: General Practice

## 2018-05-30 DIAGNOSIS — K50119 Crohn's disease of large intestine with unspecified complications: Secondary | ICD-10-CM

## 2018-05-30 MED ORDER — ACETAMINOPHEN ER 650 MG PO TBCR: 650.0000 mg | EXTENDED_RELEASE_TABLET | ORAL | Status: DC

## 2018-05-30 MED ORDER — DIPHENHYDRAMINE HCL 25 MG PO CAPS: 25.0000 mg | ORAL_CAPSULE | ORAL | Status: DC

## 2018-06-02 ENCOUNTER — Encounter (HOSPITAL_COMMUNITY)
Admission: RE | Admit: 2018-06-02 | Discharge: 2018-06-02 | Disposition: A | Discharge status: Home / Self Care | Payer: Commercial Managed Care - PPO | Source: Ambulatory Visit | Attending: Gastroenterology | Admitting: Gastroenterology

## 2018-06-02 ENCOUNTER — Encounter (HOSPITAL_COMMUNITY): Payer: Self-pay

## 2018-06-02 DIAGNOSIS — K50114 Crohn's disease of large intestine with abscess: Secondary | ICD-10-CM

## 2018-06-02 DIAGNOSIS — K50119 Crohn's disease of large intestine with unspecified complications: Secondary | ICD-10-CM | POA: Diagnosis not present

## 2018-06-02 MED ORDER — SODIUM CHLORIDE 0.9 % IV SOLN: 5.0000 mg/kg | INTRAVENOUS | Status: DC

## 2018-06-02 MED ORDER — SODIUM CHLORIDE 0.9 % IV SOLN
5.0000 mg/kg | INTRAVENOUS | Status: DC
  Administered 2018-06-02: 300 mg via INTRAVENOUS

## 2018-06-02 MED ORDER — SODIUM CHLORIDE 0.9 % IV SOLN
INTRAVENOUS | Status: DC
  Administered 2018-06-02: 12:00:00 via INTRAVENOUS

## 2018-06-02 MED ORDER — SODIUM CHLORIDE 0.9 % IV SOLN: 5.0000 mg/kg | Freq: Once | INTRAVENOUS | Status: DC

## 2018-06-02 MED ORDER — SODIUM CHLORIDE 0.9 % IV SOLN
5.0000 mg/kg | INTRAVENOUS | Status: DC
  Filled 2018-06-02: qty 30

## 2018-06-02 MED ORDER — ACETAMINOPHEN 325 MG PO TABS
650.0000 mg | ORAL_TABLET | ORAL | Status: DC
  Administered 2018-06-02: 650 mg via ORAL
  Filled 2018-06-02: qty 2

## 2018-06-02 MED ORDER — DIPHENHYDRAMINE HCL 25 MG PO CAPS
25.0000 mg | ORAL_CAPSULE | ORAL | Status: DC
  Administered 2018-06-02: 25 mg via ORAL
  Filled 2018-06-02: qty 1

## 2018-06-02 NOTE — Discharge Instructions (Signed)
Infliximab injection What is this medicine? INFLIXIMAB (in [place] i mab) is used to treat Crohn's disease and ulcerative colitis. It is also used to treat ankylosing spondylitis, plaque psoriasis, and some forms of arthritis. This medicine may be used for other purposes; ask your health care provider or pharmacist if you have questions. COMMON BRAND NAME(S): INFLECTRA, Remicade, RENFLEXIS What should I tell my health care provider before I take this medicine? They need to know if you have any of these conditions: -cancer -current or past resident of [place] or [place] -diabetes -exposure to tuberculosis -Guillain-Barre syndrome -heart failure -hepatitis or liver disease -immune system problems -infection -lung or breathing disease, like COPD -multiple sclerosis -receiving phototherapy for the skin -seizure disorder -an unusual or allergic reaction to infliximab, mouse proteins, other medicines, foods, dyes, or preservatives -pregnant or trying to get pregnant -breast-feeding How should I use this medicine? This medicine is for injection into a vein. It is usually given by a health care professional in a hospital or clinic setting. A special MedGuide will be given to you by the pharmacist with each prescription and refill. Be sure to read this information carefully each time. Talk to your pediatrician regarding the use of this medicine in children. While this drug may be prescribed for children as young as 38 years of age for selected conditions, precautions do apply. Overdosage: If you think you have taken too much of this medicine contact a poison control center or emergency room at once. NOTE: This medicine is only for you. Do not share this medicine with others. What if I miss a dose? It is important not to miss your dose. Call your doctor or health care professional if you are unable to keep an appointment. What may interact with this medicine? Do not take this  medicine with any of the following medications: -biologic medicines such as abatacept, adalimumab, anakinra, certolizumab, etanercept, golimumab, rituximab, secukinumab, tocilizumab, tofactinib, ustekinumab -live vaccines This list may not describe all possible interactions. Give your health care provider a list of all the medicines, herbs, non-prescription drugs, or dietary supplements you use. Also tell them if you smoke, drink alcohol, or use illegal drugs. Some items may interact with your medicine. What should I watch for while using this medicine? Your condition will be monitored carefully while you are receiving this medicine. Visit your doctor or health care professional for regular checks on your progress. You may need blood work done while you are taking this medicine. Before beginning therapy, your doctor may do a test to see if you have been exposed to tuberculosis. Call your doctor or health care professional for advice if you get a fever, chills or sore throat, or other symptoms of a cold or flu. Do not treat yourself. This drug decreases your body's ability to fight infections. Try to avoid being around people who are sick. This medicine may make the symptoms of heart failure worse in some patients. If you notice symptoms such as increased shortness of breath or swelling of the ankles or legs, contact your health care provider right away. If you are going to have surgery or dental work, tell your health care professional or dentist that you have received this medicine. If you take this medicine for plaque psoriasis, stay out of the sun. If you cannot avoid being in the sun, wear protective clothing and use sunscreen. Do not use sun lamps or tanning beds/booths. Talk to your doctor about your risk of cancer. You may be  more at risk for certain types of cancers if you take this medicine. What side effects may I notice from receiving this medicine? Side effects that you should report to your  doctor or health care professional as soon as possible: -allergic reactions like skin rash, itching or hives, swelling of the face, lips, or tongue -breathing problems -changes in vision -chest pain -fever or chills, usually related to the infusion -joint pain -pain, tingling, numbness in the hands or feet -redness, blistering, peeling or loosening of the skin, including inside the mouth -seizures -signs of infection - fever or chills, cough, sore throat, flu-like symptoms, pain or difficulty passing urine -signs and symptoms of liver injury like dark yellow or brown urine; general ill feeling; light-colored stools; loss of appetite; nausea; right upper belly pain; unusually weak or tired; yellowing of the eyes or skin -signs and symptoms of a stroke like changes in vision; confusion; trouble speaking or understanding; severe headaches; sudden numbness or weakness of the face, arm or leg; trouble walking; dizziness; loss of balance or coordination -swelling of the ankles, feet, or hands -swollen lymph nodes in the neck, underarm, or groin areas -unusual bleeding or bruising -unusually weak or tired Side effects that usually do not require medical attention (report to your doctor or health care professional if they continue or are bothersome): -headache -nausea -stomach pain -upset stomach This list may not describe all possible side effects. Call your doctor for medical advice about side effects. You may report side effects to FDA at 1-800-FDA-1088. Where should I keep my medicine? This drug is given in a hospital or clinic and will not be stored at home. NOTE: This sheet is a summary. It may not cover all possible information. If you have questions about this medicine, talk to your doctor, pharmacist, or health care provider.  2019 Elsevier/Gold Standard (2016-06-13 13:45:32)

## 2018-06-03 ENCOUNTER — Other Ambulatory Visit (HOSPITAL_COMMUNITY): Payer: Self-pay | Admitting: General Practice

## 2018-06-05 ENCOUNTER — Other Ambulatory Visit: Payer: Self-pay | Admitting: Cardiology

## 2018-06-05 MED FILL — DILTIAZEM 24HR CD 120 MG CA: 120 | 15 days supply | Qty: 15 | Fill #0

## 2018-06-09 ENCOUNTER — Other Ambulatory Visit: Payer: Self-pay | Admitting: Cardiology

## 2018-06-09 MED ORDER — DILTIAZEM HCL ER COATED BEADS 120 MG PO CP24: 120.0000 mg | ORAL_CAPSULE | Freq: Every day | ORAL | 0 refills | Status: DC

## 2018-06-09 NOTE — Telephone Encounter (Signed)
°*  STAT* If patient is at the pharmacy, call can be transferred to refill team.   1. Which medications need to be refilled? (please list name of each medication and dose if known) Eliquis 108m and Cartia 1277m 2. Which pharmacy/location (including street and city if local pharmacy) is medication to be sent to?[facility]  3. Do they need a 30 day or 90 day supply? Until past due appt 07-11-18

## 2018-06-09 NOTE — Telephone Encounter (Signed)
Pt requesting a refill on Eliquis. Please address

## 2018-06-10 MED ORDER — APIXABAN 5 MG PO TABS: 5.0000 mg | ORAL_TABLET | Freq: Two times a day (BID) | ORAL | 0 refills | Status: DC

## 2018-06-10 MED FILL — ELIQUIS 5 MG TABLET: 5 | 32 days supply | Qty: 64 | Fill #0

## 2018-06-13 ENCOUNTER — Other Ambulatory Visit: Payer: Self-pay | Admitting: Gastroenterology

## 2018-06-13 MED FILL — azaTHIOprine 50 MG TABS: 50 | 30 days supply | Qty: 90 | Fill #0

## 2018-06-13 MED FILL — VITAMIN D3 50000 UNIT CAPS: 1.25 MG | 84 days supply | Qty: 12 | Fill #1

## 2018-06-16 ENCOUNTER — Encounter (HOSPITAL_COMMUNITY)
Admission: RE | Admit: 2018-06-16 | Discharge: 2018-06-16 | Disposition: A | Discharge status: Home / Self Care | Payer: Commercial Managed Care - PPO | Source: Ambulatory Visit | Attending: Gastroenterology | Admitting: Gastroenterology

## 2018-06-16 ENCOUNTER — Encounter (HOSPITAL_COMMUNITY): Payer: 59

## 2018-06-16 DIAGNOSIS — K50119 Crohn's disease of large intestine with unspecified complications: Secondary | ICD-10-CM

## 2018-06-16 MED ORDER — ACETAMINOPHEN 325 MG PO TABS
650.0000 mg | ORAL_TABLET | Freq: Once | ORAL | Status: AC
  Administered 2018-06-16: 650 mg via ORAL
  Filled 2018-06-16: qty 2

## 2018-06-16 MED ORDER — SODIUM CHLORIDE 0.9 % IV SOLN
INTRAVENOUS | Status: DC
  Administered 2018-06-16: 12:00:00 via INTRAVENOUS

## 2018-06-16 MED ORDER — DIPHENHYDRAMINE HCL 25 MG PO CAPS
25.0000 mg | ORAL_CAPSULE | Freq: Once | ORAL | Status: AC
  Administered 2018-06-16: 25 mg via ORAL
  Filled 2018-06-16: qty 1

## 2018-06-16 MED ORDER — SODIUM CHLORIDE 0.9 % IV SOLN
5.0000 mg/kg | Freq: Once | INTRAVENOUS | Status: AC
  Administered 2018-06-16: 300 mg via INTRAVENOUS
  Filled 2018-06-16: qty 30

## 2018-06-16 NOTE — Discharge Instructions (Signed)
Infliximab injection What is this medicine? INFLIXIMAB (in [place] i mab) is used to treat Crohn's disease and ulcerative colitis. It is also used to treat ankylosing spondylitis, plaque psoriasis, and some forms of arthritis. This medicine may be used for other purposes; ask your health care provider or pharmacist if you have questions. COMMON BRAND NAME(S): INFLECTRA, Remicade, RENFLEXIS What should I tell my health care provider before I take this medicine? They need to know if you have any of these conditions: -cancer -current or past resident of [place] or [place] -diabetes -exposure to tuberculosis -Guillain-Barre syndrome -heart failure -hepatitis or liver disease -immune system problems -infection -lung or breathing disease, like COPD -multiple sclerosis -receiving phototherapy for the skin -seizure disorder -an unusual or allergic reaction to infliximab, mouse proteins, other medicines, foods, dyes, or preservatives -pregnant or trying to get pregnant -breast-feeding How should I use this medicine? This medicine is for injection into a vein. It is usually given by a health care professional in a hospital or clinic setting. A special MedGuide will be given to you by the pharmacist with each prescription and refill. Be sure to read this information carefully each time. Talk to your pediatrician regarding the use of this medicine in children. While this drug may be prescribed for children as young as 14 years of age for selected conditions, precautions do apply. Overdosage: If you think you have taken too much of this medicine contact a poison control center or emergency room at once. NOTE: This medicine is only for you. Do not share this medicine with others. What if I miss a dose? It is important not to miss your dose. Call your doctor or health care professional if you are unable to keep an appointment. What may interact with this medicine? Do not take this  medicine with any of the following medications: -biologic medicines such as abatacept, adalimumab, anakinra, certolizumab, etanercept, golimumab, rituximab, secukinumab, tocilizumab, tofactinib, ustekinumab -live vaccines This list may not describe all possible interactions. Give your health care provider a list of all the medicines, herbs, non-prescription drugs, or dietary supplements you use. Also tell them if you smoke, drink alcohol, or use illegal drugs. Some items may interact with your medicine. What should I watch for while using this medicine? Your condition will be monitored carefully while you are receiving this medicine. Visit your doctor or health care professional for regular checks on your progress. You may need blood work done while you are taking this medicine. Before beginning therapy, your doctor may do a test to see if you have been exposed to tuberculosis. Call your doctor or health care professional for advice if you get a fever, chills or sore throat, or other symptoms of a cold or flu. Do not treat yourself. This drug decreases your body's ability to fight infections. Try to avoid being around people who are sick. This medicine may make the symptoms of heart failure worse in some patients. If you notice symptoms such as increased shortness of breath or swelling of the ankles or legs, contact your health care provider right away. If you are going to have surgery or dental work, tell your health care professional or dentist that you have received this medicine. If you take this medicine for plaque psoriasis, stay out of the sun. If you cannot avoid being in the sun, wear protective clothing and use sunscreen. Do not use sun lamps or tanning beds/booths. Talk to your doctor about your risk of cancer. You may be  more at risk for certain types of cancers if you take this medicine. What side effects may I notice from receiving this medicine? Side effects that you should report to your  doctor or health care professional as soon as possible: -allergic reactions like skin rash, itching or hives, swelling of the face, lips, or tongue -breathing problems -changes in vision -chest pain -fever or chills, usually related to the infusion -joint pain -pain, tingling, numbness in the hands or feet -redness, blistering, peeling or loosening of the skin, including inside the mouth -seizures -signs of infection - fever or chills, cough, sore throat, flu-like symptoms, pain or difficulty passing urine -signs and symptoms of liver injury like dark yellow or brown urine; general ill feeling; light-colored stools; loss of appetite; nausea; right upper belly pain; unusually weak or tired; yellowing of the eyes or skin -signs and symptoms of a stroke like changes in vision; confusion; trouble speaking or understanding; severe headaches; sudden numbness or weakness of the face, arm or leg; trouble walking; dizziness; loss of balance or coordination -swelling of the ankles, feet, or hands -swollen lymph nodes in the neck, underarm, or groin areas -unusual bleeding or bruising -unusually weak or tired Side effects that usually do not require medical attention (report to your doctor or health care professional if they continue or are bothersome): -headache -nausea -stomach pain -upset stomach This list may not describe all possible side effects. Call your doctor for medical advice about side effects. You may report side effects to FDA at 1-800-FDA-1088. Where should I keep my medicine? This drug is given in a hospital or clinic and will not be stored at home. NOTE: This sheet is a summary. It may not cover all possible information. If you have questions about this medicine, talk to your doctor, pharmacist, or health care provider.  2019 Elsevier/Gold Standard (2016-06-13 13:45:32)

## 2018-06-19 MED FILL — DILTIAZEM HCL ER COATED BEA: 120 | 90 days supply | Qty: 90 | Fill #0

## 2018-06-19 MED FILL — METOPROLOL SUCCINATE ER 25: 25 | 90 days supply | Qty: 180 | Fill #1

## 2018-06-20 ENCOUNTER — Encounter: Payer: Self-pay | Admitting: *Deleted

## 2018-06-25 ENCOUNTER — Other Ambulatory Visit: Payer: Self-pay | Admitting: Internal Medicine

## 2018-06-25 DIAGNOSIS — N6310 Unspecified lump in the right breast, unspecified quadrant: Secondary | ICD-10-CM

## 2018-07-02 ENCOUNTER — Ambulatory Visit (INDEPENDENT_AMBULATORY_CARE_PROVIDER_SITE_OTHER): Payer: Commercial Managed Care - PPO | Admitting: Gastroenterology

## 2018-07-02 ENCOUNTER — Encounter: Payer: Self-pay | Admitting: Gastroenterology

## 2018-07-02 VITALS — BP 114/66 | HR 76 | Ht 64.0 in | Wt 152.0 lb

## 2018-07-02 DIAGNOSIS — K50119 Crohn's disease of large intestine with unspecified complications: Secondary | ICD-10-CM

## 2018-07-02 DIAGNOSIS — K50114 Crohn's disease of large intestine with abscess: Secondary | ICD-10-CM

## 2018-07-02 MED ORDER — CIPROFLOXACIN HCL 500 MG PO TABS: 500.0000 mg | ORAL_TABLET | Freq: Two times a day (BID) | ORAL | 1 refills | Status: DC

## 2018-07-02 MED ORDER — METRONIDAZOLE 500 MG PO TABS: 500.0000 mg | ORAL_TABLET | Freq: Two times a day (BID) | ORAL | 1 refills | Status: DC

## 2018-07-02 MED FILL — CIPROFLOXACIN HCL 500 MG TA: 500 | 30 days supply | Qty: 60 | Fill #0

## 2018-07-02 MED FILL — metroNIDAZOLE 500 MG TABS: 500 | 30 days supply | Qty: 60 | Fill #0

## 2018-07-02 NOTE — Patient Instructions (Addendum)
cipro 528m po bid, disp 1 month with 1 refills. Flagyl 5063mpo bid, disp 1 month with 1 refill. Please return to see Dr. JaArdis Hughsn 4 weeks.  Continue with plans for your 3rd remicade infusion later this month.  Thank you for entrusting me with your care and choosing [facility] Dr JaArdis Hughs

## 2018-07-02 NOTE — Progress Notes (Signed)
Review of pertinent gastrointestinal problems: 1.Crohn's disease:Crohn's colitis withsevereperianal disease.No evidence of small bowel disease on5/2019 CT enterography. Colonoscopy 09/2017 showed normal terminal ileum, patchy deep ulcers segmentally throughout her colon, severe perianal disease with fistulas and early abscess that was treated byoral Abx,debridement, seton placement 09/2017, Dr. Johney Maine.Colonoscopy 12/2012 for average risk screening; incidental inflammation right colon (path suggested possible IBD) was attributed to NSAID use at the time.  Labs 09/2017: TB quant gold negative, Hepatitis B Surface Ab negative, Surface Ag negative, Hep C Ab negative; TPMT activity level NORMAL.  Dual therapy with immunomodulators(azathiaprine)and biologic(humira)started 09/2017.  Poor response of perianal disease led to drug level testing (01/2018) adalimumab was 5.6 (low) at trough, no antibodies detected.  Humira increased to 7m every other week.  Continued poor perianal response led to repeat drug testing 03/2018) adalimumab trough level was 6.8 (still lower than ideal) with no antibodies detected.  03/2018 Changed humira dosing to 447monce weekly, also increased azathiaprine to 15073mnce daily.   HPI: This is a very pleasant [age]ar old woman whom I last saw here in the office about 6 weeks ago.  Since then we have switched her to Remicade.  She has completed 2 of her induction infusions and is due for her third infusion in 2 weeks from now.  She says she feels better overall.  Less heavy at her backside.  She is still having a lot of drainage however.  She is not able to sit intermittently.  I gave her a prescription for narcotic pain medicines however she is not taking them.  She did complete a full month of Cipro twice daily and Flagyl twice daily and then she transition to Flagyl 1 pill once daily since then.  She is still on the Flagyl 1 pill once daily.  She has loose stools once or  twice a day.  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  Constitutional:  No unintentional weight loss   Past Medical History:  Diagnosis Date  . Anal fistula   . Anticoagulant long-term use    eliquis  . [facility]al [facility]  [facility]/ 2019 [facility] dr [facility]dis [facility])  . Heart murmur   . Hemorrhoids    internal and external  . History of hidradenitis suppurativa    axilla  . Hyperlipidemia   . Hypertension   . [facility]) [facility]  [facility]-  dr camCurt Bears first dx 07-18-2015  . Perianal Crohn's disease, with abscess ([facility]14/[place]  . Rectal pain   . Uterine fibroid    per MRI 09-16-2017  . Wears glasses     Past Surgical History:  Procedure Laterality Date  . CESAREAN SECTION  x2  last one 1996  . COLONOSCOPY WITH PROPOFOL  last one 09-27-2017  dr d. [facility]  . INCISION AND DRAINAGE ABSCESS N/A 10/11/2017   Procedure: DRAINAGE OF PERIRECTAL ABSCESSES;  Surgeon: GroMichael BostonD;  [place];  Laterality: N/A;  . [place] 10/11/2017   Procedure: PLACEMENT OF SETON;  Surgeon: GroMichael BostonD;  [facility];  Laterality: N/A;  . RECTAL EXAM UNDER ANESTHESIA N/A 10/11/2017   Procedure: ANORECTAL EXAM UNDER ANESTHESIA;  Surgeon: GroMichael BostonD;  [place] [facility];  Laterality: N/A;  .  TRANSTHORACIC ECHOCARDIOGRAM  10/08/2012   ef 50-55%,  grade 1 diastolic dysfunction/  trivial MR and TR/  mild LAE    Current Outpatient Medications  Medication Sig Dispense Refill  . apixaban (ELIQUIS) 5 MG TABS tablet Take 1 tablet (5 mg total) by mouth 2 (two) times daily. 64 tablet 0  . azaTHIOprine (IMURAN) 50 MG tablet TAKE 3 TABLETS BY MOUTH ONCE A DAY 90 tablet 0  . Cholecalciferol  50000 units capsule Take 1 capsule (50,000 Units total) by mouth once a week. 12 capsule 1  . diltiazem (CARTIA XT) 120 MG 24 hr capsule Take 1 capsule (120 mg total) by mouth daily. Please keep upcoming appt in February for future refills. Thank you 90 capsule 0  . ferrous sulfate 325 (65 FE) MG tablet Take 1 tablet (325 mg total) by mouth 2 (two) times daily with a meal. (Patient taking differently: Take 325 mg by mouth daily. ) 180 tablet 1  . flecainide (TAMBOCOR) 150 MG tablet Take 2 tablets (300 mg total) by mouth daily as needed (for Atrial Fibrillation). 10 tablet 1  . inFLIXimab (REMICADE IV) Inject into the vein.    . metoprolol succinate (TOPROL-XL) 25 MG 24 hr tablet Take 1 tablet (25 mg total) by mouth 2 (two) times daily. 180 tablet 1  . Polyethylene Glycol 3350 (MIRALAX PO) Take by mouth daily.    [place] Kitchen thiamine (VITAMIN B-1) 50 MG tablet Take 1 tablet (50 mg total) by mouth daily. 90 tablet 1  . HYDROcodone-acetaminophen (NORCO) 7.5-325 MG tablet Take 1 tablet by mouth every 6 (six) hours as needed for moderate pain. (Patient not taking: Reported on 07/02/2018) 60 tablet 0  . HYDROmorphone (DILAUDID) 2 MG tablet Take 1-2 tablets (2-4 mg total) by mouth every 4 (four) hours as needed for severe pain. (Patient not taking: Reported on 07/02/2018) 60 tablet 0   Current Facility-Administered Medications  Medication Dose Route Frequency Provider Last Rate Last Dose  . acetaminophen (TYLENOL) CR tablet 650 mg  650 mg Oral UD Milus Banister, MD      . diphenhydrAMINE (BENADRYL) capsule 25 mg  25 mg Oral Q8 Weeks Milus Banister, MD       Facility-Administered Medications Ordered in Other Visits  Medication Dose Route Frequency Provider Last Rate Last Dose  . Chlorhexidine Gluconate Cloth 2 % PADS 6 each  6 each Topical Once Michael Boston, MD       And  . Chlorhexidine Gluconate Cloth 2 % PADS 6 each  6 each Topical Once Michael Boston, MD        Allergies as of 07/02/2018 - Review Complete  07/02/2018  Allergen Reaction Noted  . Latex Rash 10/10/2017  . Lovastatin  08/11/2012  . Protonix [pantoprazole] Diarrhea 10/10/2017  . Adhesive [tape] Rash 10/10/2017  . Penicillins Rash 08/11/2012    Family History  Problem Relation Age of Onset  . Stroke Mother   . Hypertension Mother   . Hyperlipidemia Mother   . Thyroid disease Mother   . Cancer Father        prostate  . Colon cancer Maternal Aunt   . Diabetes Neg Hx   . Depression Neg Hx   . Early death Neg Hx   . Hearing loss Neg Hx   . Heart disease Neg Hx   . Kidney disease Neg Hx   . Alcohol abuse Neg Hx   . Breast cancer Neg Hx   . Esophageal cancer Neg Hx   . Stomach  cancer Neg Hx   . Rectal cancer Neg Hx     Social History   Socioeconomic History  . Marital status: Married    Spouse name: Not on file  . Number of children: Not on file  . Years of education: Not on file  . Highest education level: Not on file  Occupational History  . Occupation: registered Optician, dispensing: [employer]  . Financial resource strain: Not on file  . Food insecurity:    Worry: Not on file    Inability: Not on file  . Transportation needs:    Medical: Not on file    Non-medical: Not on file  Tobacco Use  . Smoking status: Never Smoker  . Smokeless tobacco: Never Used  Substance and Sexual Activity  . Alcohol use: No  . Drug use: No  . Sexual activity: Not on file  Lifestyle  . Physical activity:    Days per week: Not on file    Minutes per session: Not on file  . Stress: Not on file  Relationships  . Social connections:    Talks on phone: Not on file    Gets together: Not on file    Attends religious service: Not on file    Active member of club or organization: Not on file    Attends meetings of clubs or organizations: Not on file    Relationship status: Not on file  . Intimate partner violence:    Fear of current or ex partner: Not on file    Emotionally abused: Not on file     Physically abused: Not on file    Forced sexual activity: Not on file  Other Topics Concern  . Not on file  Social History Narrative  . Not on file     Physical Exam: BP 114/66 (BP Location: Left Arm, Patient Position: Standing, Cuff Size: Normal)   Pulse 76   Ht 5' 4"  (1.626 m) Comment: height measured without shoes  Wt 152 lb (68.9 kg)   BMI 26.09 kg/m  Constitutional: generally well-appearing Psychiatric: alert and oriented x3 Abdomen: soft, nontender, nondistended, no obvious ascites, no peritoneal signs, normal bowel sounds No peripheral edema noted in lower extremities Rectal exam with female assistant in the room, she is draining purulent liquid from several sites both right and left side of her anus.  She has mild fluctuance and some tenderness throughout.  Drainage seems most impressive on her left side.  There are 7 or 8 pinpoint openings perianally.  Overall this appears very similar to it did 6 weeks ago.  Not worse but not much better   Assessment and plan: 63 y.o. female with Crohn's ileocolitis with severe perianal disease  I am putting her back on twice daily Cipro and also twice daily Flagyl.  She will continue with Remicade induction.  Her third infusion is due 2 weeks from now.  I wonder if she is rapidly metabolizing this like she proved to do with the Humira.  I do not think we can really judge it until after her induction regimen and so I will plan on Remicade drug level and antibody check just prior to her fourth infusion which will be about 2 and half months from now.    We again discussed ileostomy, colostomy option which would probably help heal her perianal disease faster but has not own set of morbidity and miseries.  She does not want to do that.  Please see the "  Patient Instructions" section for addition details about the plan.  Owens Loffler, MD [facility] 07/02/2018, 9:40 AM

## 2018-07-03 ENCOUNTER — Ambulatory Visit
Admission: RE | Admit: 2018-07-03 | Discharge: 2018-07-03 | Disposition: A | Discharge status: Home / Self Care | Payer: 59 | Source: Ambulatory Visit | Attending: Internal Medicine | Admitting: Internal Medicine

## 2018-07-03 DIAGNOSIS — N6310 Unspecified lump in the right breast, unspecified quadrant: Secondary | ICD-10-CM

## 2018-07-03 DIAGNOSIS — R922 Inconclusive mammogram: Secondary | ICD-10-CM | POA: Diagnosis not present

## 2018-07-03 LAB — HM MAMMOGRAPHY

## 2018-07-07 ENCOUNTER — Other Ambulatory Visit (HOSPITAL_COMMUNITY): Payer: Self-pay | Admitting: General Practice

## 2018-07-07 ENCOUNTER — Other Ambulatory Visit: Payer: Self-pay

## 2018-07-07 DIAGNOSIS — K50119 Crohn's disease of large intestine with unspecified complications: Secondary | ICD-10-CM

## 2018-07-11 ENCOUNTER — Encounter: Payer: Self-pay | Admitting: Cardiology

## 2018-07-11 ENCOUNTER — Ambulatory Visit (INDEPENDENT_AMBULATORY_CARE_PROVIDER_SITE_OTHER): Payer: Commercial Managed Care - PPO | Admitting: Cardiology

## 2018-07-11 VITALS — BP 128/68 | HR 67 | Ht 64.0 in | Wt 150.6 lb

## 2018-07-11 DIAGNOSIS — I480 Paroxysmal atrial fibrillation: Secondary | ICD-10-CM | POA: Diagnosis not present

## 2018-07-11 MED ORDER — DILTIAZEM HCL ER COATED BEADS 120 MG PO CP24: 120.0000 mg | ORAL_CAPSULE | Freq: Every day | ORAL | 11 refills | Status: DC

## 2018-07-11 MED ORDER — APIXABAN 5 MG PO TABS: 5.0000 mg | ORAL_TABLET | Freq: Two times a day (BID) | ORAL | 11 refills | Status: DC

## 2018-07-11 MED FILL — ELIQUIS 5 MG TABLET: 5 | 30 days supply | Qty: 60 | Fill #0

## 2018-07-11 NOTE — Progress Notes (Signed)
Electrophysiology Office Note   Date:  07/11/2018   ID:  Julie Clark, DOB [birth year], MRN [id number]  PCP:  Janith Lima, MD  Primary Electrophysiologist:  Constance Haw, MD    No chief complaint on file.    History of Present Illness: Julie Clark is a 63 y.o. female who presents today for electrophysiology evaluation.    She has a history of hypertension, hyperlipidemia and she has heart murmur. She presented to the emergency room in February with low blood pressure. She was found to be in atrial fibrillation. She was given beta blockers which converted to sinus rhythm.  Another episode of atrial fibrillation in August 2018.  She was put on both metoprolol and Cardizem.  He did convert to sinus rhythm without intervention.  Today, denies symptoms of palpitations, chest pain, shortness of breath, orthopnea, PND, lower extremity edema, claudication, dizziness, presyncope, syncope, bleeding, or neurologic sequela. The patient is tolerating medications without difficulties.  She has noted no further episodes of atrial fibrillation.  Unfortunately she has been diagnosed with rectal Crohn's disease.  She has a seton in place.  She is on her second medication, Remicade.  She says that if this does not work she may require a colostomy.   Past Medical History:  Diagnosis Date  . Anal fistula   . Anticoagulant long-term use    eliquis  . Crohn's disease of perianal region [facility])    new dx 05/ 2019 by dr Ardis Hughs ([facility])  . Heart murmur   . Hemorrhoids    internal and external  . History of hidradenitis suppurativa    axilla  . Hyperlipidemia   . Hypertension   . PAF (paroxysmal atrial fibrillation) [facility])    cardiologist-  dr Curt Bears--  first dx 07-18-2015  . Perianal Crohn's disease, with abscess ([place]) 10/08/2017  . Rectal pain   . Uterine fibroid    per MRI 09-16-2017  . Wears glasses    Past Surgical History:  Procedure Laterality Date  . CESAREAN SECTION  x2   last one 1996  . COLONOSCOPY WITH PROPOFOL  last one 09-27-2017  dr d. Ardis Hughs @ [facility]  . INCISION AND DRAINAGE ABSCESS N/A 10/11/2017   Procedure: DRAINAGE OF PERIRECTAL ABSCESSES;  Surgeon: Michael Boston, MD;  Location: [place];  Service: General;  Laterality: N/A;  . [place] N/A 10/11/2017   Procedure: PLACEMENT OF SETON;  Surgeon: Michael Boston, MD;  Location: [place];  Service: General;  Laterality: N/A;  . RECTAL EXAM UNDER ANESTHESIA N/A 10/11/2017   Procedure: ANORECTAL EXAM UNDER ANESTHESIA;  Surgeon: Michael Boston, MD;  Location: [place];  Service: General;  Laterality: N/A;  . TRANSTHORACIC ECHOCARDIOGRAM  10/08/2012   ef 50-55%,  grade 1 diastolic dysfunction/  trivial MR and TR/  mild LAE     Current Outpatient Medications  Medication Sig Dispense Refill  . apixaban (ELIQUIS) 5 MG TABS tablet Take 1 tablet (5 mg total) by mouth 2 (two) times daily. 64 tablet 0  . azaTHIOprine (IMURAN) 50 MG tablet TAKE 3 TABLETS BY MOUTH ONCE A DAY 90 tablet 0  . Cholecalciferol 50000 units capsule Take 1 capsule (50,000 Units total) by mouth once a week. 12 capsule 1  . ciprofloxacin (CIPRO) 500 MG tablet Take 1 tablet (500 mg total) by mouth 2 (two) times daily. 60 tablet 1  . diltiazem (CARTIA XT) 120 MG 24 hr capsule Take 1 capsule (120 mg  total) by mouth daily. Please keep upcoming appt in February for future refills. Thank you 90 capsule 0  . ferrous sulfate 325 (65 FE) MG tablet Take 1 tablet (325 mg total) by mouth 2 (two) times daily with a meal. (Patient taking differently: Take 325 mg by mouth daily. ) 180 tablet 1  . flecainide (TAMBOCOR) 150 MG tablet Take 2 tablets (300 mg total) by mouth daily as needed (for Atrial Fibrillation). 10 tablet 1  . HYDROcodone-acetaminophen (NORCO) 7.5-325 MG tablet Take 1 tablet by mouth every 6 (six) hours as needed for moderate pain. 60 tablet 0  . HYDROmorphone (DILAUDID) 2 MG  tablet Take 1-2 tablets (2-4 mg total) by mouth every 4 (four) hours as needed for severe pain. 60 tablet 0  . inFLIXimab (REMICADE IV) Inject into the vein.    . metoprolol succinate (TOPROL-XL) 25 MG 24 hr tablet Take 1 tablet (25 mg total) by mouth 2 (two) times daily. 180 tablet 1  . metroNIDAZOLE (FLAGYL) 500 MG tablet Take 1 tablet (500 mg total) by mouth 2 (two) times daily. 60 tablet 1  . Polyethylene Glycol 3350 (MIRALAX PO) Take by mouth daily.    Marland Kitchen thiamine (VITAMIN B-1) 50 MG tablet Take 1 tablet (50 mg total) by mouth daily. 90 tablet 1   No current facility-administered medications for this visit.    Facility-Administered Medications Ordered in Other Visits  Medication Dose Route Frequency Provider Last Rate Last Dose  . Chlorhexidine Gluconate Cloth 2 % PADS 6 each  6 each Topical Once Michael Boston, MD       And  . Chlorhexidine Gluconate Cloth 2 % PADS 6 each  6 each Topical Once Michael Boston, MD        Allergies:   Latex; Lovastatin; Protonix [pantoprazole]; Adhesive [tape]; and Penicillins   Social History:  The patient  reports that she has never smoked. She has never used smokeless tobacco. She reports that she does not drink alcohol or use drugs.   Family History:  The patient's family history includes Cancer in her father; Colon cancer in her maternal aunt; Hyperlipidemia in her mother; Hypertension in her mother; Stroke in her mother; Thyroid disease in her mother.    ROS:  Please see the history of present illness.   Otherwise, review of systems is positive for none.   All other systems are reviewed and negative.   PHYSICAL EXAM: VS:  BP 128/68   Pulse 67   Ht 5' 4"  (1.626 m)   Wt 150 lb 9.6 oz (68.3 kg)   SpO2 97%   BMI 25.85 kg/m  , BMI Body mass index is 25.85 kg/m. GEN: Well nourished, well developed, in no acute distress  HEENT: normal  Neck: no JVD, carotid bruits, or masses Cardiac: RRR; no murmurs, rubs, or gallops,no edema  Respiratory:  clear  to auscultation bilaterally, normal work of breathing GI: soft, nontender, nondistended, + BS MS: no deformity or atrophy  Skin: warm and dry Neuro:  Strength and sensation are intact Psych: euthymic mood, full affect  EKG:  EKG is ordered today. Personal review of the ekg ordered shows SR, rate 67   Recent Labs: 03/19/2018: TSH 0.69 05/05/2018: ALT 3; BUN 14; Creatinine, Ser 0.65; Hemoglobin 10.2; Platelets 576.0; Potassium 3.9; Sodium 138    Lipid Panel     Component Value Date/Time   CHOL 214 (H) 03/19/2018 0829   TRIG 129.0 03/19/2018 0829   HDL 43.00 03/19/2018 0829   CHOLHDL 5 03/19/2018  0829   VLDL 25.8 03/19/2018 0829   LDLCALC 145 (H) 03/19/2018 0829     Wt Readings from Last 3 Encounters:  07/11/18 150 lb 9.6 oz (68.3 kg)  07/02/18 152 lb (68.9 kg)  06/16/18 149 lb 2 oz (67.6 kg)      Other studies Reviewed: Additional studies/ records that were reviewed today include: TTE 10/09/15  Review of the above records today demonstrates:  - Left ventricle: The cavity size was normal. Wall thickness was normal. Systolic function was normal. The estimated ejection fraction was in the range of 55% to 60%. Wall motion was normal; there were no regional wall motion abnormalities. Doppler parameters are consistent with abnormal left ventricular relaxation (grade 1 diastolic dysfunction). - Aortic valve: Mean gradient: 45m Hg (S). Peak gradient: 123mHg (S). - Left atrium: The atrium was mildly dilated. - Pulmonary arteries: PA peak pressure: 3231mg (S).   ASSESSMENT AND PLAN:  1.   Paroxysmal atrial fibrillation: He on Eliquis and diltiazem.  She is on flecainide as a pill in the pocket strategy.  She has had no further episodes of atrial fibrillation since flecainide was initiated.  No changes.    This patients CHA2DS2-VASc Score and unadjusted Ischemic Stroke Rate (% per year) is equal to 2.2 % stroke rate/year from a score of 2  Above score  calculated as 1 point each if present [CHF, HTN, DM, Vascular=MI/PAD/Aortic Plaque, Age if 65-74, or Female] Above score calculated as 2 points each if present [Age > 75, or Stroke/TIA/TE]   2. Hypertension: Controlled.  No changes.  Current medicines are reviewed at length with the patient today.   The patient does not have concerns regarding her medicines.  The following changes were made today: None  Labs/ tests ordered today include:  No orders of the defined types were placed in this encounter.    Disposition:   FU with Unknown Sch[place]er 12 months  Signed, Kjersten Ormiston MarMeredith LeedsD  07/11/2018 9:12 AM     [facility] [address]iShell [phone]) ([phone])

## 2018-07-11 NOTE — Patient Instructions (Signed)
Medication Instructions:  Your physician recommends that you continue on your current medications as directed. Please refer to the Current Medication list given to you today.  * If you need a refill on your cardiac medications before your next appointment, please call your pharmacy.   Labwork: None ordered *We will only notify you of abnormal results, otherwise continue current treatment plan.  Testing/Procedures: None ordered  Follow-Up: Your physician wants you to follow-up in: 1 year with Dr. Curt Bears.  You will receive a reminder letter in the mail two months in advance. If you don't receive a letter, please call our office to schedule the follow-up appointment.  *Please note that any paperwork needing to be filled out by the provider will need to be addressed at the front desk prior to seeing the provider. Please note that any FMLA, disability or other documents regarding health condition is subject to a $25.00 charge that must be received prior to completion of paperwork in the form of a money order or check.  Thank you for choosing [facility]!!   Trinidad Curet, RN [phone]

## 2018-07-11 NOTE — Addendum Note (Signed)
Addended by: Stanton Kidney on: 07/11/2018 10:03 AM   Modules accepted: Orders

## 2018-07-14 ENCOUNTER — Encounter (HOSPITAL_COMMUNITY)
Admission: RE | Admit: 2018-07-14 | Discharge: 2018-07-14 | Disposition: A | Discharge status: Home / Self Care | Payer: 59 | Source: Ambulatory Visit | Attending: Gastroenterology | Admitting: Gastroenterology

## 2018-07-14 ENCOUNTER — Encounter (HOSPITAL_COMMUNITY): Payer: Self-pay

## 2018-07-14 DIAGNOSIS — K50119 Crohn's disease of large intestine with unspecified complications: Secondary | ICD-10-CM | POA: Diagnosis not present

## 2018-07-14 MED ORDER — ACETAMINOPHEN 325 MG PO TABS: 650.0000 mg | ORAL_TABLET | ORAL | Status: DC

## 2018-07-14 MED ORDER — DIPHENHYDRAMINE HCL 25 MG PO CAPS: 25.0000 mg | ORAL_CAPSULE | ORAL | Status: DC

## 2018-07-14 MED ORDER — SODIUM CHLORIDE 0.9 % IV SOLN
INTRAVENOUS | Status: AC
  Administered 2018-07-14: 08:00:00 via INTRAVENOUS

## 2018-07-14 MED ORDER — SODIUM CHLORIDE 0.9 % IV SOLN
5.0000 mg/kg | INTRAVENOUS | Status: DC
  Administered 2018-07-14: 300 mg via INTRAVENOUS
  Filled 2018-07-14: qty 30

## 2018-07-15 ENCOUNTER — Other Ambulatory Visit: Payer: Self-pay | Admitting: Gastroenterology

## 2018-07-15 MED FILL — azaTHIOprine 50 MG TABS: 50 | 30 days supply | Qty: 90 | Fill #0

## 2018-07-28 ENCOUNTER — Ambulatory Visit: Payer: Commercial Managed Care - PPO | Admitting: Gastroenterology

## 2018-08-12 ENCOUNTER — Other Ambulatory Visit: Payer: Self-pay

## 2018-08-12 ENCOUNTER — Ambulatory Visit (INDEPENDENT_AMBULATORY_CARE_PROVIDER_SITE_OTHER): Payer: 59 | Admitting: Gastroenterology

## 2018-08-12 ENCOUNTER — Encounter: Payer: Self-pay | Admitting: Gastroenterology

## 2018-08-12 VITALS — BP 110/64 | HR 73 | Temp 96.5°F | Ht 64.0 in | Wt 152.0 lb

## 2018-08-12 DIAGNOSIS — K50119 Crohn's disease of large intestine with unspecified complications: Secondary | ICD-10-CM

## 2018-08-12 MED FILL — ELIQUIS 5 MG TABLET: 5 | 30 days supply | Qty: 60 | Fill #1

## 2018-08-12 MED FILL — azaTHIOprine 50 MG TABS: 50 | 30 days supply | Qty: 90 | Fill #1

## 2018-08-12 MED FILL — CIPROFLOXACIN HCL 500 MG TA: 500 | 30 days supply | Qty: 60 | Fill #1

## 2018-08-12 MED FILL — metroNIDAZOLE 500 MG TABS: 500 | 30 days supply | Qty: 60 | Fill #1

## 2018-08-12 NOTE — Patient Instructions (Addendum)
Start taking your cipro and flagyl at [facility] [place] dosing.  Remicade drug level and remicade antibodies to be checked the DAY PRIOR to your next remicade infusion.  Appt at [facility] in next few weeks. On 08/29/18 at 10:15am with Dr Dema Severin  Thank you for entrusting me with your care and choosing [facility].  Dr Ardis Hughs

## 2018-08-12 NOTE — Progress Notes (Signed)
Review of pertinent gastrointestinal problems: 1.Crohn's disease:Crohn's colitis withsevereperianal disease.No evidence of small bowel disease on5/2019 CT enterography. Colonoscopy 09/2017 showed normal terminal ileum, patchy deep ulcers segmentally throughout her colon, severe perianal disease with fistulas and early abscess that was treated byoral Abx,debridement, seton placement 09/2017, Dr. Johney Maine.Colonoscopy 12/2012 for average risk screening; incidental inflammation right colon (path suggested possible IBD) was attributed to NSAID use at the time.  Labs 09/2017: TB quant gold negative, Hepatitis B Surface Ab negative, Surface Ag negative, Hep C Ab negative; TPMT activity level NORMAL.  Dual therapy with immunomodulators(azathiaprine)and biologic(humira)started 09/2017. Poor response of perianal disease led to drug level testing (01/2018) adalimumab was 5.6 (low) at trough, no antibodies detected. Humira increased to 55m every other week. Continued poor perianal response led to repeat drug testing 03/2018) adalimumab trough level was 6.8 (still lower than ideal) with no antibodies detected. 03/2018 Changed humira dosing to 453monce weekly, also increased azathiaprine to 1508mnce daily.   2020 despite 40 mg once weekly Humira her drug levels were lower than acceptable.  Changed to 5 mg/kg Remicade 2020.  HPI: This is a very pleasant [age]ar old woman with Crohn's colitis with severe perianal disease  She is having soft but formed stools without bleeding.  She is most bothered by her significant perianal disease still.  Her Remicade induction infusions have completed.  Her third dose was 1 month ago.  She will start her first Remicade maintenance dosing in 1 month from now.  She has been on Cipro and Flagyl persistently for at least 4 to 6 weeks now.  She has only been taking each of the medicines once daily however.  She is having no fevers and chills and no serious abdominal  pains.   Chief complaint is Crohn's colitis with severe perianal disease  ROS: complete GI ROS as described in HPI, all other review negative.  Constitutional:  No unintentional weight loss   Past Medical History:  Diagnosis Date  . Anal fistula   . Anticoagulant long-term use    eliquis  . [facility]  [facility]  . Heart murmur   . Hemorrhoids    internal and external  . History of hidradenitis suppurativa    axilla  . Hyperlipidemia   . Hypertension   . [facility]  dr camCurt Bears first dx 07-18-2015  . [place]  . Rectal pain   . Uterine fibroid    per MRI 09-16-2017  . Wears glasses     Past Surgical History:  Procedure Laterality Date  . CESAREAN SECTION  x2  last one 1996  . COLONOSCOPY WITH PROPOFOL  last one 09-27-2017  [facility]  . INCISION AND DRAINAGE ABSCESS N/A 10/11/2017   Procedure: DRAINAGE OF PERIRECTAL ABSCESSES;  Surgeon: GroMichael BostonD;  [place];  Laterality: N/A;  . [place] 10[place]   Procedure: PLACEMENT OF SETON;  Surgeon: GroMichael BostonD;  [place];  Laterality: N/A;  . RECTAL EXAM UNDER ANESTHESIA N/A 10/11/2017   Procedure: ANORECTAL EXAM UNDER ANESTHESIA;  Surgeon: GroMichael BostonD;  [place];  Laterality: N/A;  . TRANSTHORACIC ECHOCARDIOGRAM  10/08/2012   ef 50-55%,  grade 1 diastolic dysfunction/  trivial MR and TR/  mild LAE    Current Outpatient Medications  Medication Sig Dispense Refill  . apixaban (ELIQUIS) 5 MG TABS tablet Take 1 tablet (5 mg total) by mouth 2 (two) times daily. 60 tablet 11  . azaTHIOprine (IMURAN) 50 MG tablet TAKE 3 TABLETS BY MOUTH ONCE A DAY 90 tablet 3  . Cholecalciferol 50000  units capsule Take 1 capsule (50,000 Units total) by mouth once a week. 12 capsule 1  . ciprofloxacin (CIPRO) 500 MG tablet Take 1 tablet (500 mg total) by mouth 2 (two) times daily. 60 tablet 1  . diltiazem (CARTIA XT) 120 MG 24 hr capsule Take 1 capsule (120 mg total) by mouth daily. Please keep upcoming appt in February for future refills. Thank you 30 capsule 11  . ferrous sulfate 325 (65 FE) MG tablet Take 1 tablet (325 mg total) by mouth 2 (two) times daily with a meal. (Patient taking differently: Take 325 mg by mouth daily. ) 180 tablet 1  . flecainide (TAMBOCOR) 150 MG tablet Take 2 tablets (300 mg total) by mouth daily as needed (for Atrial Fibrillation). 10 tablet 1  . HYDROcodone-acetaminophen (NORCO) 7.5-325 MG tablet Take 1 tablet by mouth every 6 (six) hours as needed for moderate pain. 60 tablet 0  . HYDROmorphone (DILAUDID) 2 MG tablet Take 1-2 tablets (2-4 mg total) by mouth every 4 (four) hours as needed for severe pain. 60 tablet 0  . inFLIXimab (REMICADE IV) Inject into the vein.    . metoprolol succinate (TOPROL-XL) 25 MG 24 hr tablet Take 1 tablet (25 mg total) by mouth 2 (two) times daily. 180 tablet 1  . metroNIDAZOLE (FLAGYL) 500 MG tablet Take 1 tablet (500 mg total) by mouth 2 (two) times daily. 60 tablet 1  . Polyethylene Glycol 3350 (MIRALAX PO) Take by mouth daily.    Marland Kitchen thiamine (VITAMIN B-1) 50 MG tablet Take 1 tablet (50 mg total) by mouth daily. 90 tablet 1   No current facility-administered medications for this visit.    Facility-Administered Medications Ordered in Other Visits  Medication Dose Route Frequency Provider Last Rate Last Dose  . Chlorhexidine Gluconate Cloth 2 % PADS 6 each  6 each Topical Once Michael Boston, MD       And  . Chlorhexidine Gluconate Cloth 2 % PADS 6 each  6 each Topical Once Michael Boston, MD        Allergies as of 08/12/2018 - Review Complete 08/12/2018  Allergen Reaction Noted  . Latex Rash 10/10/2017  . Lovastatin  08/11/2012   . Protonix [pantoprazole] Diarrhea 10/10/2017  . Adhesive [tape] Rash 10/10/2017  . Penicillins Rash 08/11/2012    Family History  Problem Relation Age of Onset  . Stroke Mother   . Hypertension Mother   . Hyperlipidemia Mother   . Thyroid disease Mother   . Prostate cancer Father   . Colon cancer Maternal Aunt   . Diabetes Neg Hx   . Depression Neg Hx   . Early death Neg Hx   . Hearing loss Neg Hx   . Heart disease Neg Hx   . Kidney disease Neg Hx   . Alcohol abuse Neg Hx   . Breast cancer Neg Hx   . Esophageal cancer Neg Hx   . Stomach cancer Neg Hx   . Rectal cancer Neg Hx     Social History   Socioeconomic History  . Marital status: Married    Spouse name: Not on file  . Number of children: Not on  file  . Years of education: Not on file  . Highest education level: Not on file  Occupational History  . Occupation: registered Optician, dispensing: [employer]  . Financial resource strain: Not on file  . Food insecurity:    Worry: Not on file    Inability: Not on file  . Transportation needs:    Medical: Not on file    Non-medical: Not on file  Tobacco Use  . Smoking status: Never Smoker  . Smokeless tobacco: Never Used  Substance and Sexual Activity  . Alcohol use: No  . Drug use: No  . Sexual activity: Not on file  Lifestyle  . Physical activity:    Days per week: Not on file    Minutes per session: Not on file  . Stress: Not on file  Relationships  . Social connections:    Talks on phone: Not on file    Gets together: Not on file    Attends religious service: Not on file    Active member of club or organization: Not on file    Attends meetings of clubs or organizations: Not on file    Relationship status: Not on file  . Intimate partner violence:    Fear of current or ex partner: Not on file    Emotionally abused: Not on file    Physically abused: Not on file    Forced sexual activity: Not on file  Other Topics Concern  . Not on  file  Social History Narrative  . Not on file     Physical Exam: BP 110/64   Pulse 73   Temp (!) 96.5 F (35.8 C)   Ht 5' 4"  (1.626 m)   Wt 152 lb (68.9 kg)   BMI 26.09 kg/m  Constitutional: generally well-appearing Psychiatric: alert and oriented x3 Abdomen: soft, nontender, nondistended, no obvious ascites, no peritoneal signs, normal bowel sounds No peripheral edema noted in lower extremities Rectal examination with female assistant in the room.  She still has 7 or 8 open areas in the perianus.  She has swelling without tenderness associated with 3 of the sites.  She has drainage at all of the sites.  Her examination is not improved since the last time I looked which was for 5 weeks ago.  Delphina Cahill is still in place and that spot on her perianus looks well  Assessment and plan: 63 y.o. female with Crohn's colitis with severe perianal disease  She has completed Remicade induction infusions after drug levels for Humira were persistently subtherapeutic.  She will get her first maintenance dose of Remicade in 1 month.  This is at 5 mg/kg dosing.  She is on the higher end of weight-based immunomodulators therapy as well.  Her perianal disease is not improving.  I plan to check therapeutic drug levels of Remicade as well as Remicade antibodies the day prior to her next Remicade infusion which will be about a month from now.  I have urged her to start taking her Cipro and Flagyl at twice daily dosing rather than the once daily dosing which she has been doing.  I explained to her that depending on therapeutic drug levels we may have some room to increase her Remicade to 10 mg/kg but otherwise medical options are running out.  I am going to have her seen again at [facility] surgery to consider further seton placement, unroofing of the clearly infected areas.  She and I once again discussed that  ostomy might be helpful to heal her perianus.  She is understandably very reluctant for that step at  this point.  Please see the "Patient Instructions" section for addition details about the plan.  Owens Loffler, MD [facility] 08/12/2018, 11:17 AM

## 2018-08-20 ENCOUNTER — Ambulatory Visit: Payer: Commercial Managed Care - PPO | Admitting: Gastroenterology

## 2018-09-02 ENCOUNTER — Other Ambulatory Visit: Payer: Self-pay

## 2018-09-02 ENCOUNTER — Other Ambulatory Visit (INDEPENDENT_AMBULATORY_CARE_PROVIDER_SITE_OTHER): Payer: 59

## 2018-09-02 DIAGNOSIS — K50119 Crohn's disease of large intestine with unspecified complications: Secondary | ICD-10-CM | POA: Diagnosis not present

## 2018-09-02 LAB — COMPREHENSIVE METABOLIC PANEL
ALT: 11 U/L (ref 0–35)
AST: 14 U/L (ref 0–37)
Albumin: 4.3 g/dL (ref 3.5–5.2)
Alkaline Phosphatase: 100 U/L (ref 39–117)
BUN: 15 mg/dL (ref 6–23)
CO2: 28 mEq/L (ref 19–32)
Calcium: 9.7 mg/dL (ref 8.4–10.5)
Chloride: 99 mEq/L (ref 96–112)
Creatinine, Ser: 0.78 mg/dL (ref 0.40–1.20)
GFR: 90.42 mL/min (ref >60.00)
Glucose, Bld: 103 mg/dL — ABNORMAL HIGH (ref 70–99)
Potassium: 3.9 mEq/L (ref 3.5–5.1)
Sodium: 137 mEq/L (ref 135–145)
Total Bilirubin: 0.5 mg/dL (ref 0.2–1.2)
Total Protein: 7.9 g/dL (ref 6.0–8.3)

## 2018-09-02 LAB — CBC WITH DIFFERENTIAL/PLATELET
Basophils Absolute: 0.1 10*3/uL (ref 0.0–0.1)
Basophils Relative: 0.6 % (ref 0.0–3.0)
Eosinophils Absolute: 0 10*3/uL (ref 0.0–0.7)
Eosinophils Relative: 0.2 % (ref 0.0–5.0)
HCT: 36.4 % (ref 36.0–46.0)
Hemoglobin: 12.2 g/dL (ref 12.0–15.0)
Lymphocytes Relative: 4.2 % — ABNORMAL LOW (ref 12.0–46.0)
Lymphs Abs: 0.5 10*3/uL — ABNORMAL LOW (ref 0.7–4.0)
MCHC: 33.6 g/dL (ref 30.0–36.0)
MCV: 92.4 fl (ref 78.0–100.0)
Monocytes Absolute: 0.9 10*3/uL (ref 0.1–1.0)
Monocytes Relative: 6.6 % (ref 3.0–12.0)
Neutro Abs: 11.7 10*3/uL — ABNORMAL HIGH (ref 1.4–7.7)
Neutrophils Relative %: 88.4 % — ABNORMAL HIGH (ref 43.0–77.0)
Platelets: 416 10*3/uL — ABNORMAL HIGH (ref 150.0–400.0)
RBC: 3.94 Mil/uL (ref 3.87–5.11)
RDW: 16.1 % — ABNORMAL HIGH (ref 11.5–15.5)
WBC: 13.2 10*3/uL — ABNORMAL HIGH (ref 4.0–10.5)

## 2018-09-11 ENCOUNTER — Other Ambulatory Visit: Payer: Self-pay

## 2018-09-11 ENCOUNTER — Other Ambulatory Visit: Payer: 59

## 2018-09-11 DIAGNOSIS — K50119 Crohn's disease of large intestine with unspecified complications: Secondary | ICD-10-CM

## 2018-09-11 NOTE — Progress Notes (Signed)
infliximab

## 2018-09-15 ENCOUNTER — Ambulatory Visit (HOSPITAL_COMMUNITY)
Admission: RE | Admit: 2018-09-15 | Discharge: 2018-09-15 | Disposition: A | Discharge status: Home / Self Care | Payer: 59 | Source: Ambulatory Visit | Attending: Gastroenterology | Admitting: Gastroenterology

## 2018-09-15 ENCOUNTER — Encounter (HOSPITAL_COMMUNITY): Payer: Self-pay

## 2018-09-15 ENCOUNTER — Other Ambulatory Visit: Payer: Self-pay | Admitting: Internal Medicine

## 2018-09-15 ENCOUNTER — Encounter (HOSPITAL_COMMUNITY): Payer: 59

## 2018-09-15 ENCOUNTER — Other Ambulatory Visit: Payer: Self-pay

## 2018-09-15 ENCOUNTER — Inpatient Hospital Stay (HOSPITAL_COMMUNITY)
Admission: RE | Admit: 2018-09-15 | Discharge: 2018-09-15 | Disposition: A | Discharge status: Home / Self Care | Payer: 59 | Source: Ambulatory Visit | Attending: Gastroenterology | Admitting: Gastroenterology

## 2018-09-15 DIAGNOSIS — E559 Vitamin D deficiency, unspecified: Secondary | ICD-10-CM

## 2018-09-15 DIAGNOSIS — I10 Essential (primary) hypertension: Secondary | ICD-10-CM

## 2018-09-15 DIAGNOSIS — K50119 Crohn's disease of large intestine with unspecified complications: Secondary | ICD-10-CM | POA: Diagnosis not present

## 2018-09-15 DIAGNOSIS — I480 Paroxysmal atrial fibrillation: Secondary | ICD-10-CM

## 2018-09-15 MED ORDER — ACETAMINOPHEN 325 MG PO TABS: 650.0000 mg | ORAL_TABLET | ORAL | Status: DC

## 2018-09-15 MED ORDER — SODIUM CHLORIDE 0.9 % IV SOLN
5.0000 mg/kg | INTRAVENOUS | Status: DC
  Administered 2018-09-15: 08:00:00 300 mg via INTRAVENOUS
  Filled 2018-09-15: qty 30

## 2018-09-15 MED ORDER — DIPHENHYDRAMINE HCL 25 MG PO CAPS: 25.0000 mg | ORAL_CAPSULE | ORAL | Status: DC

## 2018-09-15 MED ORDER — SODIUM CHLORIDE 0.9 % IV SOLN
INTRAVENOUS | Status: AC
  Administered 2018-09-15: 08:00:00 via INTRAVENOUS

## 2018-09-15 MED FILL — VITAMIN D3 50000 UNIT CAPS: 1.25 MG | 84 days supply | Qty: 12 | Fill #0

## 2018-09-15 MED FILL — METOPROLOL SUCCINATE ER 25: 25 | 90 days supply | Qty: 180 | Fill #0

## 2018-09-15 MED FILL — azaTHIOprine 50 MG TABS: 50 | 30 days supply | Qty: 90 | Fill #2

## 2018-09-15 MED FILL — ELIQUIS 5 MG TABLET: 5 | 30 days supply | Qty: 60 | Fill #2

## 2018-09-15 MED FILL — DILTIAZEM 24HR CD 120 MG CA: 120 | 90 days supply | Qty: 90 | Fill #0

## 2018-09-16 ENCOUNTER — Other Ambulatory Visit: Payer: Self-pay | Admitting: Gastroenterology

## 2018-09-16 MED ORDER — METRONIDAZOLE 500 MG PO TABS: 500.0000 mg | ORAL_TABLET | Freq: Two times a day (BID) | ORAL | 0 refills | Status: DC

## 2018-09-16 MED ORDER — CIPROFLOXACIN HCL 500 MG PO TABS: 500.0000 mg | ORAL_TABLET | Freq: Two times a day (BID) | ORAL | 0 refills | Status: DC

## 2018-09-16 MED FILL — CIPROFLOXACIN HCL 500 MG TA: 500 | 42 days supply | Qty: 84 | Fill #0

## 2018-09-16 MED FILL — METRONIDAZOLE 500 MG TABS: 500 | 42 days supply | Qty: 84 | Fill #0

## 2018-09-17 LAB — TIQ-MISC: QUESTION:: 93176

## 2018-09-23 ENCOUNTER — Other Ambulatory Visit: Payer: Self-pay

## 2018-09-23 DIAGNOSIS — K50119 Crohn's disease of large intestine with unspecified complications: Secondary | ICD-10-CM

## 2018-09-25 LAB — INFLIXIMAB LEVEL AND ADA FOR IBD
Infliximab ADA, IBD: <10 AU (ref <10)
Infliximab Level, IBD: 1.3 ug/mL

## 2018-09-25 LAB — INFLIXIMAB LEVEL,ADA FOR RHEUMATIC DZ

## 2018-09-29 ENCOUNTER — Encounter (HOSPITAL_COMMUNITY): Payer: Self-pay

## 2018-09-29 ENCOUNTER — Ambulatory Visit (HOSPITAL_COMMUNITY)
Admission: RE | Admit: 2018-09-29 | Discharge: 2018-09-29 | Disposition: A | Discharge status: Home / Self Care | Payer: 59 | Source: Ambulatory Visit | Attending: Gastroenterology | Admitting: Gastroenterology

## 2018-09-29 ENCOUNTER — Other Ambulatory Visit: Payer: Self-pay

## 2018-09-29 DIAGNOSIS — K50119 Crohn's disease of large intestine with unspecified complications: Secondary | ICD-10-CM | POA: Insufficient documentation

## 2018-09-29 MED ORDER — SODIUM CHLORIDE 0.9 % IV SOLN
10.0000 mg/kg | INTRAVENOUS | Status: DC
  Administered 2018-09-29: 700 mg via INTRAVENOUS
  Filled 2018-09-29: qty 70

## 2018-09-29 MED ORDER — SODIUM CHLORIDE 0.9 % IV SOLN
INTRAVENOUS | Status: AC
  Administered 2018-09-29: 08:00:00 via INTRAVENOUS

## 2018-09-29 MED FILL — Sodium Chloride IV Soln 0.9%: INTRAVENOUS | Qty: 250 | Status: AC

## 2018-10-08 ENCOUNTER — Encounter (HOSPITAL_COMMUNITY): Payer: 59

## 2018-10-09 ENCOUNTER — Telehealth: Payer: Self-pay

## 2018-10-09 DIAGNOSIS — K50119 Crohn's disease of large intestine with unspecified complications: Secondary | ICD-10-CM

## 2018-10-09 MED FILL — ELIQUIS 5 MG TABLET: 5 | 30 days supply | Qty: 60 | Fill #3

## 2018-10-09 MED FILL — azaTHIOprine 50 MG TABS: 50 | 30 days supply | Qty: 90 | Fill #3

## 2018-10-09 NOTE — Telephone Encounter (Signed)
Yes, thanks

## 2018-10-09 NOTE — Telephone Encounter (Signed)
-----   Message from Timothy Lasso, RN sent at 07/07/2018  9:30 AM EST ----- Needs quant TB gold

## 2018-10-09 NOTE — Telephone Encounter (Signed)
The pt has been advised to come in for Quant gold.

## 2018-10-09 NOTE — Telephone Encounter (Signed)
Dr Vangie Bicker had her last Percival Spanish on 10/07/17. Do you want me to order now?

## 2018-10-22 ENCOUNTER — Other Ambulatory Visit: Payer: Self-pay | Admitting: Gastroenterology

## 2018-10-23 MED FILL — CIPROFLOXACIN HCL 500 MG TA: 500 | 10 days supply | Qty: 20 | Fill #2

## 2018-10-23 MED FILL — METRONIDAZOLE 500 MG TABS: 500 | 30 days supply | Qty: 60 | Fill #1

## 2018-10-24 ENCOUNTER — Other Ambulatory Visit: Payer: Self-pay | Admitting: Gastroenterology

## 2018-10-25 ENCOUNTER — Other Ambulatory Visit: Payer: Self-pay | Admitting: Gastroenterology

## 2018-10-28 ENCOUNTER — Other Ambulatory Visit: Payer: Self-pay | Admitting: Gastroenterology

## 2018-11-08 ENCOUNTER — Other Ambulatory Visit: Payer: Self-pay | Admitting: Gastroenterology

## 2018-11-08 MED FILL — ELIQUIS 5 MG TABLET: 5 | 30 days supply | Qty: 60 | Fill #4

## 2018-11-10 ENCOUNTER — Encounter (HOSPITAL_COMMUNITY): Payer: 59

## 2018-11-10 ENCOUNTER — Other Ambulatory Visit: Payer: 59

## 2018-11-10 DIAGNOSIS — K50119 Crohn's disease of large intestine with unspecified complications: Secondary | ICD-10-CM | POA: Diagnosis not present

## 2018-11-10 MED FILL — azaTHIOprine 50 MG TABS: 50 | 30 days supply | Qty: 90 | Fill #0

## 2018-11-12 LAB — QUANTIFERON-TB GOLD PLUS
Mitogen-NIL: 7.09 IU/mL
NIL: 0.01 IU/mL
QuantiFERON-TB Gold Plus: NEGATIVE
TB1-NIL: 0 IU/mL
TB2-NIL: 0 IU/mL

## 2018-11-13 ENCOUNTER — Telehealth: Payer: Self-pay | Admitting: General Surgery

## 2018-11-13 NOTE — Telephone Encounter (Signed)
Covid-19 screening questions   Do you now or have you had a fever in the last 14 days? NO  Do you have any respiratory symptoms of shortness of breath or cough now or in the last 14 days?NO  Do you have any family members or close contacts with diagnosed or suspected Covid-19 in the past 14 days? NO  Have you been tested for Covid-19 and found to be positive? NO  Patient aware of needing to wear a mask.

## 2018-11-14 ENCOUNTER — Ambulatory Visit (INDEPENDENT_AMBULATORY_CARE_PROVIDER_SITE_OTHER): Payer: 59 | Admitting: Gastroenterology

## 2018-11-14 ENCOUNTER — Encounter: Payer: Self-pay | Admitting: Gastroenterology

## 2018-11-14 VITALS — BP 124/78 | HR 91 | Temp 97.3°F | Ht 65.0 in | Wt 156.1 lb

## 2018-11-14 DIAGNOSIS — K50119 Crohn's disease of large intestine with unspecified complications: Secondary | ICD-10-CM

## 2018-11-14 NOTE — Progress Notes (Signed)
Review of pertinent gastrointestinal problems: 1.Crohn's disease:Crohn's colitis withsevereperianal disease.No evidence of small bowel disease on5/2019 CT enterography. Colonoscopy 09/2017 showed normal terminal ileum, patchy deep ulcers segmentally throughout her colon, severe perianal disease with fistulas and early abscess that was treated byoral Abx,debridement, seton placement 09/2017, Dr. Johney Maine.Colonoscopy 12/2012 for average risk screening; incidental inflammation right colon (path suggested possible IBD) was attributed to NSAID use at the time.  Labs 09/2017: TB quant gold negative, Hepatitis B Surface Ab negative, Surface Ag negative, Hep C Ab negative; TPMT activity level NORMAL.  TB quant gold 10/2018 negative  Dual therapy with immunomodulators(azathiaprine)and biologic(humira)started 09/2017. Poor response of perianal disease led to drug level testing (01/2018) adalimumab was 5.6 (low) at trough, no antibodies detected. Humira increased to 16m every other week. Continued poor perianal response led to repeat drug testing 03/2018) adalimumab trough level was 6.8 (still lower than ideal) with no antibodies detected. 03/2018 Changed humira dosing to 459monce weekly, also increased azathiaprine to 15041mnce daily.  2020 despite 40 mg once weekly Humira her drug levels were lower than acceptable.  Changed to 5 mg/kg Remicade 2020.  Remicade drug levels 08/2018: suboptimal (1.3mc52mL at 5mg/66mremicade).  ADA undetectable.  Increased remicade to 10mg/50m  HPI: This is a very pleasant 62 yea31old woman whom I last saw officially in the office about 3 months ago.  She's on imuran 150mg d45m (2-2.5mg/kg)76mRemicade 10mg/kg 43my 8 weeks.  cipro and flagyl daily (both of these once or twice daily).  She has soft formed stools.  She is still very uncomfortable at her backside.  The sites weep spots of blood periodically mucus periodically.  She is having no fevers or chills.  She  has no abdominal pains.  No nausea or vomiting.  Chief complaint is Crohn's Crohn's colitis  ROS: complete GI ROS as described in HPI, all other review negative.  Constitutional:  No unintentional weight loss   Past Medical History:  Diagnosis Date  . Anal fistula   . Anticoagulant long-term use    eliquis  . Crohn's disease of perianal region (HCC)    [facility]  . Heart murmur   . Hemorrhoids    internal and external  . History of hidradenitis suppurativa    axilla  . Hyperlipidemia   . Hypertension   . PAF (paroxysmal atrial fibrillation) (HCC)    [facility]2015  . Perianal Crohn's disease, with abscess (HCC) 5/14[place]19  . Rectal pain   . Uterine fibroid    per MRI 09-16-2017  . Wears glasses     Past Surgical History:  Procedure Laterality Date  . CESAREAN SECTION  x2  last one 1996  . COLONOSCOPY WITH PROPOFOL  last one 09-27-2017  dr d. Zanaria [facility]ll [facility]  . INCISION AND DRAINAGE ABSCESS N/A 10/11/2017   Procedure: DRAINAGE OF PERIRECTAL ABSCESSES;  Surgeon: Gross, StMichael Bostoncation: [place]: General;  Laterality: N/A;  [place]   Procedure: PLACEMENT OF SETON;  Surgeon: Gross, StMichael Bostoncation: [place]: General;  Laterality: N/A;  . RECTAL EXAM UNDER ANESTHESIA N/A 10/11/2017   Procedure: ANORECTAL EXAM UNDER ANESTHESIA;  Surgeon: Gross, StMichael Bostoncation: [place]: General;  Laterality: N/A;  . TRANSTHORACIC ECHOCARDIOGRAM  10/08/2012   ef 50-55%,  grade 1 diastolic dysfunction/  trivial MR  and TR/  mild LAE    Current Outpatient Medications  Medication Sig Dispense Refill  . apixaban (ELIQUIS) 5 MG TABS tablet Take 1 tablet (5 mg total) by mouth 2 (two) times daily. 60 tablet 11  . azaTHIOprine (IMURAN) 50 MG tablet TAKE 3 TABLETS BY MOUTH ONCE A DAY 90 tablet 3  .  Cholecalciferol (VITAMIN D3) 1.25 MG (50000 UT) CAPS TAKE 1 CAPSULE (50,000 UNITS TOTAL) BY MOUTH ONCE A WEEK. 12 capsule 0  . ciprofloxacin (CIPRO) 500 MG tablet Take 1 tablet (500 mg total) by mouth 2 (two) times daily. 84 tablet 0  . diltiazem (CARTIA XT) 120 MG 24 hr capsule Take 1 capsule (120 mg total) by mouth daily. Please keep upcoming appt in February for future refills. Thank you 30 capsule 11  . ferrous sulfate 325 (65 FE) MG tablet Take 1 tablet (325 mg total) by mouth 2 (two) times daily with a meal. (Patient taking differently: Take 325 mg by mouth daily. ) 180 tablet 1  . flecainide (TAMBOCOR) 150 MG tablet Take 2 tablets (300 mg total) by mouth daily as needed (for Atrial Fibrillation). 10 tablet 1  . HYDROcodone-acetaminophen (NORCO) 7.5-325 MG tablet Take 1 tablet by mouth every 6 (six) hours as needed for moderate pain. 60 tablet 0  . HYDROmorphone (DILAUDID) 2 MG tablet Take 1-2 tablets (2-4 mg total) by mouth every 4 (four) hours as needed for severe pain. 60 tablet 0  . inFLIXimab (REMICADE IV) Inject into the vein.    . metoprolol succinate (TOPROL-XL) 25 MG 24 hr tablet TAKE 1 TABLET (25 MG TOTAL) BY MOUTH 2 TIMES DAILY. 180 tablet 1  . metroNIDAZOLE (FLAGYL) 500 MG tablet Take 1 tablet (500 mg total) by mouth 2 (two) times daily. 84 tablet 0  . Polyethylene Glycol 3350 (MIRALAX PO) Take by mouth daily.    Marland Kitchen thiamine (VITAMIN B-1) 50 MG tablet Take 1 tablet (50 mg total) by mouth daily. 90 tablet 1   No current facility-administered medications for this visit.    Facility-Administered Medications Ordered in Other Visits  Medication Dose Route Frequency Provider Last Rate Last Dose  . Chlorhexidine Gluconate Cloth 2 % PADS 6 each  6 each Topical Once Michael [place], MD       And  . Chlorhexidine Gluconate Cloth 2 % PADS 6 each  6 each Topical Once Michael [place], MD        Allergies as of 11/14/2018 - Review Complete 09/15/2018  Allergen Reaction Noted  . Latex Rash  10/10/2017  . Lovastatin  08/11/2012  . Protonix [pantoprazole] Diarrhea 10/10/2017  . Adhesive [tape] Rash 10/10/2017  . Penicillins Rash 08/11/2012    Family History  Problem Relation Age of Onset  . Stroke Mother   . Hypertension Mother   . Hyperlipidemia Mother   . Thyroid disease Mother   . Prostate cancer Father   . Colon cancer Maternal Aunt   . Diabetes Neg Hx   . Depression Neg Hx   . Early death Neg Hx   . Hearing loss Neg Hx   . Heart disease Neg Hx   . Kidney disease Neg Hx   . Alcohol abuse Neg Hx   . Breast cancer Neg Hx   . Esophageal cancer Neg Hx   . Stomach cancer Neg Hx   . Rectal cancer Neg Hx     Social History   Socioeconomic History  . Marital status: Married    Spouse name: Not on file  .  Number of children: Not on file  . Years of education: Not on file  . Highest education level: Not on file  Occupational History  . Occupation: registered Optician, dispensing: [employer]  . Financial resource strain: Not on file  . Food insecurity    Worry: Not on file    Inability: Not on file  . Transportation needs    Medical: Not on file    Non-medical: Not on file  Tobacco Use  . Smoking status: Never Smoker  . Smokeless tobacco: Never Used  Substance and Sexual Activity  . Alcohol use: No  . Drug use: No  . Sexual activity: Not on file  Lifestyle  . Physical activity    Days per week: Not on file    Minutes per session: Not on file  . Stress: Not on file  Relationships  . Social Herbalist on phone: Not on file    Gets together: Not on file    Attends religious service: Not on file    Active member of club or organization: Not on file    Attends meetings of clubs or organizations: Not on file    Relationship status: Not on file  . Intimate partner violence    Fear of current or ex partner: Not on file    Emotionally abused: Not on file    Physically abused: Not on file    Forced sexual activity: Not on file   Other Topics Concern  . Not on file  Social History Narrative  . Not on file     Physical Exam: BP 124/78 (BP Location: Right Arm, Patient Position: Sitting, Cuff Size: Normal)   Pulse 91   Temp (!) 97.3 F (36.3 C) (Temporal)   Ht 5' 5"  (1.651 m)   Wt 156 lb 2 oz (70.8 kg)   SpO2 98%   BMI 25.98 kg/m   Constitutional: generally well-appearing Psychiatric: alert and oriented x3 Abdomen: soft, nontender, nondistended, no obvious ascites, no peritoneal signs, normal bowel sounds No peripheral edema noted in lower extremities Rectal examination swelling and induration in multiple areas surrounding her anus.  The previously placed seton is about the only area that looks relatively normal.  She has 7-9 excoriated pinpoints of cherry red granulation tissue, there is induration underneath, no obvious fluctuance.  I can express some purulence from 1 of the indurated areas through a small pinpoint hole on the right side.  Assessment and plan: 63 y.o. female with Crohn's colitis with severe perianal disease  Perianal disease is not improving.  This is despite maximal immunomodulators, daily antibiotics, Remicade at double dose.  She is due for her second Remicade infusion at this double dose in 10 days from now.  We will check for circulating antibodies and therapeutic drug levels again.  She is failing maximum medical therapy.  We will get her in with [facility] surgery, Dr. Dema Severin in the next 2 or 3 weeks for his opinion.  She may benefit from multiple seton placement or perhaps diversion.  Gianni is understandably not excited about the possibilities.  I did explain to her that I do not think her perianal disease will improve without some sort of surgical treatment whether it be multiple seton trial or stool diversion with an ostomy.  I am very interested to see what Dr. Dema Severin feels about her case.  Please see the "Patient Instructions" section for addition details about the  plan.  Quillian Quince  [facility], MD [facility] 11/14/2018, 8:33 AM

## 2018-11-14 NOTE — Patient Instructions (Signed)
Your provider has requested that you go to the basement level for lab on 11/20/18. Press "B" on the elevator. The lab is located at the first door on the left as you exit the elevator.  You will be set up for referral to see Dr Dema Severin at [place] the week of July 8th  Thank you for entrusting me with your care and choosing [facility].  Dr [facility]

## 2018-11-17 ENCOUNTER — Encounter (HOSPITAL_COMMUNITY): Payer: 59

## 2018-11-20 ENCOUNTER — Other Ambulatory Visit: Payer: 59

## 2018-11-20 DIAGNOSIS — K50119 Crohn's disease of large intestine with unspecified complications: Secondary | ICD-10-CM

## 2018-11-24 ENCOUNTER — Ambulatory Visit (HOSPITAL_COMMUNITY)
Admission: RE | Admit: 2018-11-24 | Discharge: 2018-11-24 | Disposition: A | Discharge status: Home / Self Care | Payer: 59 | Source: Ambulatory Visit | Attending: Gastroenterology | Admitting: Gastroenterology

## 2018-11-24 ENCOUNTER — Other Ambulatory Visit: Payer: Self-pay

## 2018-11-24 DIAGNOSIS — K50119 Crohn's disease of large intestine with unspecified complications: Secondary | ICD-10-CM | POA: Diagnosis not present

## 2018-11-24 MED ORDER — DIPHENHYDRAMINE HCL 25 MG PO CAPS
25.0000 mg | ORAL_CAPSULE | ORAL | Status: DC
  Filled 2018-11-24: qty 1

## 2018-11-24 MED ORDER — SODIUM CHLORIDE 0.9 % IV SOLN
INTRAVENOUS | Status: AC
  Administered 2018-11-24: 09:00:00 via INTRAVENOUS

## 2018-11-24 MED ORDER — ACETAMINOPHEN 325 MG PO TABS
650.0000 mg | ORAL_TABLET | ORAL | Status: DC
  Filled 2018-11-24: qty 2

## 2018-11-24 MED ORDER — SODIUM CHLORIDE 0.9 % IV SOLN
10.0000 mg/kg | INTRAVENOUS | Status: DC
  Administered 2018-11-24: 700 mg via INTRAVENOUS
  Filled 2018-11-24: qty 70

## 2018-11-24 NOTE — Progress Notes (Signed)
                   Patient Care Center Note   Diagnosis: Crohn's Disease    Provider: Milus Banister MD     Procedure: IV Remicade    Note: Patient received IV Remicade via PIV and tolerated well. Vitals were monitor during course of treatment and were stable. Patient decline one hour observation. Discharge instructions given. Patient alert, oriented and ambulatory at time of discharge.   Otho Bellows, RN

## 2018-11-24 NOTE — Discharge Instructions (Signed)
Infliximab injection What is this medicine? INFLIXIMAB (in [place] i mab) is used to treat Crohn's disease and ulcerative colitis. It is also used to treat ankylosing spondylitis, plaque psoriasis, and some forms of arthritis. This medicine may be used for other purposes; ask your health care provider or pharmacist if you have questions. COMMON BRAND NAME(S): INFLECTRA, Remicade, RENFLEXIS What should I tell my health care provider before I take this medicine? They need to know if you have any of these conditions:  cancer  current or past resident of [place] or [place]  diabetes  exposure to tuberculosis  Guillain-Barre syndrome  heart failure  hepatitis or liver disease  immune system problems  infection  lung or breathing disease, like COPD  multiple sclerosis  receiving phototherapy for the skin  seizure disorder  an unusual or allergic reaction to infliximab, mouse proteins, other medicines, foods, dyes, or preservatives  pregnant or trying to get pregnant  breast-feeding How should I use this medicine? This medicine is for injection into a vein. It is usually given by a health care professional in a hospital or clinic setting. A special MedGuide will be given to you by the pharmacist with each prescription and refill. Be sure to read this information carefully each time. Talk to your pediatrician regarding the use of this medicine in children. While this drug may be prescribed for children as young as 49 years of age for selected conditions, precautions do apply. Overdosage: If you think you have taken too much of this medicine contact a poison control center or emergency room at once. NOTE: This medicine is only for you. Do not share this medicine with others. What if I miss a dose? It is important not to miss your dose. Call your doctor or health care professional if you are unable to keep an appointment. What may interact with this medicine? Do not  take this medicine with any of the following medications:  biologic medicines such as abatacept, adalimumab, anakinra, certolizumab, etanercept, golimumab, rituximab, secukinumab, tocilizumab, tofactinib, ustekinumab  live vaccines This list may not describe all possible interactions. Give your health care provider a list of all the medicines, herbs, non-prescription drugs, or dietary supplements you use. Also tell them if you smoke, drink alcohol, or use illegal drugs. Some items may interact with your medicine. What should I watch for while using this medicine? Your condition will be monitored carefully while you are receiving this medicine. Visit your doctor or health care professional for regular checks on your progress. You may need blood work done while you are taking this medicine. Before beginning therapy, your doctor may do a test to see if you have been exposed to tuberculosis. Call your doctor or health care professional for advice if you get a fever, chills or sore throat, or other symptoms of a cold or flu. Do not treat yourself. This drug decreases your body's ability to fight infections. Try to avoid being around people who are sick. This medicine may make the symptoms of heart failure worse in some patients. If you notice symptoms such as increased shortness of breath or swelling of the ankles or legs, contact your health care provider right away. If you are going to have surgery or dental work, tell your health care professional or dentist that you have received this medicine. If you take this medicine for plaque psoriasis, stay out of the sun. If you cannot avoid being in the sun, wear protective clothing and use sunscreen. Do not  use sun lamps or tanning beds/booths. Talk to your doctor about your risk of cancer. You may be more at risk for certain types of cancers if you take this medicine. What side effects may I notice from receiving this medicine? Side effects that you should  report to your doctor or health care professional as soon as possible:  allergic reactions like skin rash, itching or hives, swelling of the face, lips, or tongue  breathing problems  changes in vision  chest pain  fever or chills, usually related to the infusion  joint pain  pain, tingling, numbness in the hands or feet  redness, blistering, peeling or loosening of the skin, including inside the mouth  seizures  signs of infection - fever or chills, cough, sore throat, flu-like symptoms, pain or difficulty passing urine  signs and symptoms of liver injury like dark yellow or brown urine; general ill feeling; light-colored stools; loss of appetite; nausea; right upper belly pain; unusually weak or tired; yellowing of the eyes or skin  signs and symptoms of a stroke like changes in vision; confusion; trouble speaking or understanding; severe headaches; sudden numbness or weakness of the face, arm or leg; trouble walking; dizziness; loss of balance or coordination  swelling of the ankles, feet, or hands  swollen lymph nodes in the neck, underarm, or groin areas  unusual bleeding or bruising  unusually weak or tired Side effects that usually do not require medical attention (report to your doctor or health care professional if they continue or are bothersome):  headache  nausea  stomach pain  upset stomach This list may not describe all possible side effects. Call your doctor for medical advice about side effects. You may report side effects to FDA at 1-800-FDA-1088. Where should I keep my medicine? This drug is given in a hospital or clinic and will not be stored at home. NOTE: This sheet is a summary. It may not cover all possible information. If you have questions about this medicine, talk to your doctor, pharmacist, or health care provider.  2020 Elsevier/Gold Standard (2016-06-13 13:45:32)

## 2018-12-02 ENCOUNTER — Other Ambulatory Visit: Payer: Self-pay | Admitting: Internal Medicine

## 2018-12-02 DIAGNOSIS — E559 Vitamin D deficiency, unspecified: Secondary | ICD-10-CM

## 2018-12-02 LAB — INFLIXIMAB LEVEL AND ADA FOR IBD
Infliximab ADA, IBD: <10 AU (ref <10)
Infliximab Level, IBD: 3.7 ug/mL

## 2018-12-02 LAB — INFLIXIMAB LEVEL,ADA FOR RHEUMATIC DZ

## 2018-12-02 MED FILL — VITAMIN D3 50000 UNIT CAPS: 1.25 MG | 84 days supply | Qty: 12 | Fill #0

## 2018-12-03 ENCOUNTER — Other Ambulatory Visit: Payer: Self-pay

## 2018-12-03 DIAGNOSIS — K50114 Crohn's disease of large intestine with abscess: Secondary | ICD-10-CM

## 2018-12-03 DIAGNOSIS — K50119 Crohn's disease of large intestine with unspecified complications: Secondary | ICD-10-CM

## 2018-12-03 DIAGNOSIS — R1084 Generalized abdominal pain: Secondary | ICD-10-CM

## 2018-12-04 MED FILL — azaTHIOprine 50 MG TABS: 50 | 30 days supply | Qty: 90 | Fill #1

## 2018-12-05 MED FILL — ELIQUIS 5 MG TABLET: 5 | 30 days supply | Qty: 60 | Fill #5

## 2018-12-08 DIAGNOSIS — K50113 Crohn's disease of large intestine with fistula: Secondary | ICD-10-CM | POA: Diagnosis not present

## 2018-12-08 MED FILL — METOPROLOL SUCCINATE ER 25: 25 | 90 days supply | Qty: 180 | Fill #1

## 2018-12-08 MED FILL — CARTIA XT 120 MG CP24: 120 | 90 days supply | Qty: 90 | Fill #1

## 2018-12-10 ENCOUNTER — Telehealth: Payer: Self-pay

## 2018-12-10 ENCOUNTER — Other Ambulatory Visit: Payer: Self-pay | Admitting: Gastroenterology

## 2018-12-10 MED ORDER — CIPROFLOXACIN HCL 500 MG PO TABS: 500.0000 mg | ORAL_TABLET | Freq: Two times a day (BID) | ORAL | 0 refills | Status: DC

## 2018-12-10 MED FILL — CIPROFLOXACIN HCL 500 MG TA: 500 | 30 days supply | Qty: 60 | Fill #0

## 2018-12-10 NOTE — Telephone Encounter (Signed)
Pharm please address eliquis thanks

## 2018-12-10 NOTE — Telephone Encounter (Signed)
   [facility] Pre-operative Risk Assessment    Request for surgical clearance:  1. What type of surgery is being performed? EVALUATION OF ANAL   2. When is this surgery scheduled? TBD  3. What type of clearance is required (medical clearance vs. Pharmacy clearance to hold med vs. Both)? MEDICAL CLEARANCE  4. Are there any medications that need to be held prior to surgery and how long?  ELIQUIS  5. Practice name and name of physician performing surgery? [facility]; DR WHITE  6. What is your office phone number [phone]    7.   What is your office fax number [place]  8.   Anesthesia type (None, local, MAC, general) ? GENERAL   Julie Clark 12/10/2018, 4:28 PM  _________________________________________________________________   (provider comments below)

## 2018-12-11 NOTE — Telephone Encounter (Signed)
Patient with diagnosis of Afib on Eliquis for anticoagulation.    Procedure: anal rectal exam under anesthesia with placement of additional setons. Date of procedure: TBD  CHADS2-VASc score of  2 ( HTN female)  CrCl 67 ml/min  Per office protocol, patient can hold Eliquis for 1-2 days prior to procedure.

## 2018-12-11 NOTE — Telephone Encounter (Signed)
I attempted to call the patient - unable to leave a message.  Please contact the surgeon's office for more details of the procedure per pharmacy request.  Kerin Ransom PA-C 12/11/2018 10:06 AM

## 2018-12-11 NOTE — Telephone Encounter (Signed)
Spoke with nurse at Dr Orest Dikes office and she stated patient is having a anal rectal exam under anesthesia with placement of additional setons.

## 2018-12-11 NOTE — Telephone Encounter (Signed)
   Primary Cardiologist: Will Meredith Leeds, MD  Chart reviewed and patient contacted by phone today as part of pre-operative protocol coverage. Given past medical history and time since last visit, based on ACC/AHA guidelines, Julie Clark would be at acceptable risk for the planned procedure without further cardiovascular testing.   OK to hold Eliquis 1-2 days pre op if needed.   I will route this recommendation to the requesting party via Epic fax function and remove from pre-op pool.  Please call with questions.  Kerin Ransom, PA-C 12/11/2018, 2:46 PM

## 2018-12-11 NOTE — Telephone Encounter (Signed)
What is actual procedure?

## 2018-12-24 ENCOUNTER — Ambulatory Visit: Payer: Self-pay | Admitting: Surgery

## 2018-12-24 NOTE — H&P (Signed)
CC: Referred by Dr. Ardis Hughs for evaluation of significant perianal crohn's disease  HPI: Julie Clark is a very pleasant 83yoF with hx of afib (now on Eliquis but sinus rhythm), perianal Crohn's disease - she was referred to our office initially and so a partner Dr. gross with perianal fistula as well as external hemorrhoids. She is taken to the operating room 10/11/2017 and underwent anorectal examination is the common drainage of perirectal abscesses, placement of seton, external hemorrhoidectomy, internal hemorrhoid ligation. Pathology on these returned benign. She recovered reasonably well from all this but has had ongoing issues with recurrent perianal abscesses/drainage in various locations. She has been followed with Dr. Ardis Hughs. She has been diagnosed with Crohn's disease.  She underwent colonoscopy 12/2012 which showed inflammation in the cecum and ascending colon was biopsied but felt to be due to NSAID use at that time. Terminal ileum was normal. No polyps or cancers. Subsequent colonoscopy 09/2017 showed large external hemorrhoids with sinus tracts evident. Terminal ileum was normal. Biopsies were taken. Moderate to severe inflammation throughout the colon. Mucosa throughout the colon is fairly granular and mildly inflamed. Several areas of deep ulceration. The rectum was very inflamed and ulcerated.  The most recent colonoscopy 04/2018 showed focally ulcerated stricture-like segment in the ascending colon which was biopsied. Segmental inflammation presumably in the transverse or hepatic flexure was also seen 10 cm distal to the stricture. Biopsied. Perianal area was a bit improved with seton noted. -Pathology showed right colon-chronic mildly active colitis. Additional right colon-chronic mildly active colitis. No dysplasia or malignancy was noted on any of her pathology.  She has been following with Dr. Ardis Hughs for medical management of her disease. She was initially started on dual  therapy with immunological layers-as a fibrin and Humira 09/2017. She had a fairly poor response with regards to her perianal disease as of November 2019. Her Humira dosing was increased as it is subtherapeutic. Despite this, her levels were still unacceptable and she was ultimately changed to Remicade. April 2020, suboptimal levels and dose increased. Her levels remain just below therapeutic range with plans to increase the dose this month.  She reports ongoing perianal drainage and discomfort which is been stable over the last 6 months. She has purulent drainage from multiple locations. She denies any fever/chills. She reports her only symptom is being discomfort in the perianal area. She denies abdominal pain, nausea/vomiting, changes in bowel habits.  PMH: Afib (on Eliquis); Crohn's disease  PSH: C-sx x2 Hemorrhoidectomy, seton 09/2017 - Dr. Johney Maine  FHx: Denies FHx of malignancy  Social: Denies use of tobacco/EtOH/drugs. 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  The patient is a 63 year old female.   Allergies Julie Clark, CMA; 12/08/2018 10:51 AM) Latex Exam Gloves *MEDICAL DEVICES AND SUPPLIES*  Rash. OxyCODONE HCl (Abuse Deter) *ANALGESICS - OPIOID*  Vomiting. n/v. Tolerates tramadol & hydrocodone. Penicillins  Allergies Reconciled   Medication History Julie Clark, CMA; 12/08/2018 10:51 AM) TraMADol HCl (50MG Tablet, 1-2 Oral every six hours, as needed, Taken starting 10/16/2017) Active. Proctozone-HC (2.5% Cream, 1 (one) Rectal three times daily, as needed, Taken starting 09/03/2017) Active. Cartia XT (120MG Capsule ER 24HR, Oral) Active. Eliquis (5MG Tablet, Oral) Active. Metoprolol Succinate ER (25MG Tablet ER 24HR, Oral) Active. Ibuprofen (800MG Tablet, Oral) Active. Medications Reconciled    Review of Systems  Harrell Gave M. Ihsan Nomura MD; 12/08/2018  11:25 AM) General Not Present- Appetite Loss, Chills, Fatigue, Fever, Night Sweats, Weight Gain and Weight Loss. Skin Not Present- Change in Wart/Mole, Dryness, Hives, Jaundice, New Lesions, Non-Healing Wounds, Rash and Ulcer. HEENT Present- Wears glasses/contact lenses. Not Present- Earache, Hearing Loss, Hoarseness, Nose Bleed, Oral Ulcers, Ringing in the Ears, Seasonal Allergies, Sinus Pain, Sore Throat, Visual Disturbances and Yellow Eyes. Respiratory Not Present- Bloody sputum, Chronic Cough, Difficulty Breathing, Snoring and Wheezing. Breast Not Present- Breast Mass, Breast Pain, Nipple Discharge and Skin Changes. Gastrointestinal Present- Hemorrhoids. Not Present- Abdominal Pain, Bloating, Bloody Stool, Change in Bowel Habits, Chronic diarrhea, Constipation, Difficulty Swallowing, Excessive gas, Gets full quickly at meals, Indigestion, Nausea, Rectal Pain and Vomiting. Female Genitourinary Not Present- Frequency, Nocturia, Painful Urination, Pelvic Pain and Urgency. Musculoskeletal Not Present- Back Pain, Joint Pain, Joint Stiffness, Muscle Pain, Muscle Weakness and Swelling of Extremities. Neurological Not Present- Decreased Memory, Fainting, Headaches, Numbness, Seizures, Tingling, Tremor, Trouble walking and Weakness. Psychiatric Not Present- Anxiety, Bipolar, Change in Sleep Pattern, Depression, Fearful and Frequent crying. Endocrine Not Present- Cold Intolerance, Excessive Hunger, Hair Changes, Heat Intolerance, Hot flashes and New Diabetes. Hematology Present- Blood Thinners. Not Present- Easy Bruising, Excessive bleeding, Gland problems, HIV and Persistent Infections.  Vitals Julie Clark CMA; 12/08/2018 10:52 AM) 12/08/2018 10:51 AM Weight: 153.13 lb Height: 64in Body Surface Area: 1.75 m Body Mass Index: 26.28 kg/m  Temp.: 98.19F (Oral)  Pulse: 83 (Regular)  BP: 130/82(Sitting, Left Arm, Standard)       Physical Exam  Harrell Gave M. Aldea Avis MD; 12/08/2018 11:26 AM) The physical exam findings are as follows: Note: Constitutional: No acute distress; conversant; no deformities Eyes: Moist conjunctiva; no lid lag; anicteric sclerae; pupils equal round and reactive to light Neck: Trachea midline; no palpable thyromegaly Lungs: Normal respiratory effort; no tactile fremitus CV: Regular rate and rhythm; no palpable thrill; no pitting edema GI: Abdomen soft, nontender, nondistended; no palpable hepatosplenomegaly Anorectal: Seton in place left anterior; numerous probable external openings scattered around the perianal area and on the gluteal cleft as well as fairly high up on her sacral area. No active abscess. Purulent drainage from multiple occasions however. No surrounding erythema. No fluctuance-all these areas appear to be open and actively draining. DRE/anoscopy deferred at her request today. MSK: Normal gait; no clubbing/cyanosis Psychiatric: Appropriate affect; alert and oriented 3 Lymphatic: No palpable cervical or axillary lymphadenopathy **A chaperone, [employer], was present for this encounter    Assessment & Plan Harrell Gave M. 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 Osoria is a very pleasant 17yoF with hx of Afib (on eliquis), severe perianal Crohn's; Crohn's of ascending/hepatic flexure with stricture which is currently asymptomatic - bx benign 04/2018 Impression: -The anatomy and physiology of the anal canal was discussed at length with the patient. The pathophysiology of Crohn's disease and fistulas was discussed at length as well -We discussed options moving forward. To assist with the discomfort which is likely related to ongoing inflammation and the purulent drainage she has, we discussed anorectal exam under anesthesia with placement of additional setons. We discussed with further observation which is also an option, ongoing discomfort. We  discussed ongoing discomfort despite seton placement as well at the point of this would be to decrease her risk of perianal abscess recurrence. -We discussed that the plana be to see how her disease response to Remicade with the increasing doses to a therapeutic level. Given the aggressive nature of her disease, we discussed that ultimately this may  not be manageable with medical therapy. We discussed diverting colostomy with a staged procedure. We discussed that this colostomy would be permeant. We discussed additional surgery following all of this as well including removal of the rectum and anus. We discussed that the ascending colon stricture need to be reevaluated and potentially addressed as well. With the extent of her perianal disease, is also possible she would need some sort of flap to assist with closure and we discussed potential referral to tertiary care if this were necessary. -The planned procedure, material risks (including, but not limited to, pain, bleeding, infection, scarring, need for blood transfusion, damage to anal sphincter, incontinence of gas and/or stool, need for additional procedures, recurrence, pneumonia, heart attack, stroke, death) benefits and alternatives to surgery were discussed at length. I noted a good probability that the procedure would help improve their symptoms. The patient's questions were answered to their satisfaction, they voiced understanding and elected to proceed with surgery. Additionally, we discussed typical postop reco  Signed electronically by Ileana Roup, MD (12/08/2018 11:30 AM)

## 2018-12-26 ENCOUNTER — Telehealth: Payer: Self-pay

## 2018-12-26 NOTE — Telephone Encounter (Signed)
Terry from [facility] called back and said in order for [place] to try to authorize Remicaid 27m q 6wks, we would have to start a new case.  I will start a new case today.

## 2018-12-26 NOTE — Telephone Encounter (Signed)
Thank you let me know if I need to do anything

## 2018-12-26 NOTE — Telephone Encounter (Signed)
Called [employer] to request Remicade dosage per Dr Ardis Hughs to 41m q 6wks and see if that could be approved.  I had to leave a vm for the nurse to call me back.

## 2018-12-26 NOTE — Telephone Encounter (Signed)
-----   Message from Marlon Pel, RN sent at 12/26/2018 10:37 AM EDT ----- Regarding: FW: Remicade auth  ----- Message ----- From: Milus Banister, MD Sent: 12/26/2018  10:22 AM EDT To: Marlon Pel, RN, Milus Banister, MD, # Subject: RE: Lum Babe                              OK I am currently TRYING to set up a peer to peer but getting a lot of resistence from her insurance company. For now, plese try 41m/kg every 6 weeks rather than every 8 and see if her [place] authorize that regiment instead.  Also,please turn this into a phone note. Barriers to her care like this should be part of her permanent medical records.  Thanks  ----- Message ----- From: HDarden DatesSent: 12/26/2018  10:01 AM EDT To: SMarlon Pel RN, DMilus Banister MD, # Subject: RLum Babe                                 Good morning, Just got response on increasing Jaylea's Remicade to 164mhas been denied.  They said drug policy was updated in July that they are following FDA recommended max dose of 1070m(right now [employer] is going crazy changing all kinds of drug policies between June and July)  At this point a peer to peer can be set up by calling [phone] Case# [id number] have all records and labs showing why she needs the increase, but still have denied. Thanks, Amy

## 2019-01-03 MED FILL — azaTHIOprine 50 MG TABS: 50 | 30 days supply | Qty: 90 | Fill #2

## 2019-01-05 MED FILL — ELIQUIS 5 MG TABLET: 5 | 30 days supply | Qty: 60 | Fill #6

## 2019-01-07 ENCOUNTER — Other Ambulatory Visit: Payer: Self-pay | Admitting: Gastroenterology

## 2019-01-07 MED ORDER — METRONIDAZOLE 500 MG PO TABS: 500.0000 mg | ORAL_TABLET | Freq: Two times a day (BID) | ORAL | 0 refills | Status: DC

## 2019-01-07 MED ORDER — CIPROFLOXACIN HCL 500 MG PO TABS: 500.0000 mg | ORAL_TABLET | Freq: Two times a day (BID) | ORAL | 0 refills | Status: DC

## 2019-01-07 MED FILL — CIPROFLOXACIN HCL 500 MG TA: 500 | 30 days supply | Qty: 60 | Fill #0

## 2019-01-07 MED FILL — METRONIDAZOLE 500 MG TABS: 500 | 30 days supply | Qty: 60 | Fill #0

## 2019-01-12 NOTE — Telephone Encounter (Signed)
Dr Ardis Hughs please see note from [facility].

## 2019-01-12 NOTE — Telephone Encounter (Signed)
Amy can you confirm that the contact information is correct. See Dr Ardis Hughs note

## 2019-01-12 NOTE — Telephone Encounter (Signed)
Dr Ardis Hughs I spoke with nurse Estill Bamberg and she tells me her number should have never been given out.  She did ask for your number to pass on to the medical director.  The director will call you at his/her  "convenience" and if you are not available the medical director will leave a number to reach them back.

## 2019-01-12 NOTE — Telephone Encounter (Signed)
I'm so sorry.  These insurance companies are a mess trying to get the right one.  Ok, I called and got the number to the nurse directly on the case.  I called and confirmed that it's her.  She does have vm set up in case you have to leave your phone number.  Her name is Julie Clark.  Her number is [phone].  I'll give you some more info in case you need it.  ID# [id number] Group# [id number] Case# [id number].  Let me know if there are any more issues.

## 2019-01-12 NOTE — Telephone Encounter (Signed)
Finally got word back from [facility] that the case has been denied again to have Remicade 16m for every 6wks.  They are denying basically saying if med is not working, she should change medication.  They are offering a peer to peer to be set up. You can call @ [phone]# [id number] set it up for Dr. JArdis Hughs

## 2019-01-12 NOTE — Telephone Encounter (Signed)
I tried to arrange a peer to peer review again today.  I tried the same thing 10 days ago.  10 days ago I was given 2 different phone numbers after they initially took 15 to 20 minutes to even locate her records.  I tried both of those numbers today and neither of them got me anywhere other than an automated system that was completely circular.  I tried the number that you provided below.  The human that I spoke with could never access her records.  They doubted whether she was actually insured through them.  I gave them the case number that you provided.  The person I spoke with had no way of entering that number and any other systems to check to see if she is a patient of theirs.  Refer me to customer service.   This is very frustrating.  I find this insurance plan extremely difficult to deal with.  I have tried to set up peer-to-peer reviews twice now and met with extreme resistance from their company.  They have difficulty even finding her records and make it sound like she is not actually insured with them.  I am not sure what the hang-up is.   [name], Can you please try to set up a peer to peer review?  Around lunchtime's would be good.  Not tomorrow because I have a meeting.  Possibly Wednesday or possibly Friday.  Thank you

## 2019-01-12 NOTE — Telephone Encounter (Signed)
Place a call to the number given "Julie Clark"  No answer had to leave a voice mail to return call.

## 2019-01-13 NOTE — Telephone Encounter (Signed)
I have not heard from anyone yet and so I called Amanda at the number below.  There was no answer.  I left a very detailed voice message including all of the ID number group number, case numbers.  I asked that she call back so that I can schedule a peer to peer review.   This continues to be a very frustrating process to arrange for peer-to-peer review.

## 2019-01-15 ENCOUNTER — Telehealth: Payer: Self-pay | Admitting: Gastroenterology

## 2019-01-15 NOTE — Telephone Encounter (Signed)
Julie Clark, I was finally able to have a peer to peer review for [place].  In the end it became clear that our discussion was never going to lead to authorization of increasing her Remicade dosage.  The physician that I spoke with from her insurance company recommended that we file a formal appeal.  The reference number for her appeal is [id number].  Can you please cut and pasted the below into a appeal cover letter which they require.  It should be faxed to [phone].  Can you please also check with her insurance company to make sure that we provide all the appropriate documentation that goes along with this cover letter.  Thank you very much for your help with this.    To whom it may concern;  Thank you for reconsidering this appeal.  My patient, Julie Clark, has severe colon and perianal Crohn's.  She has numerous draining sites perianally and has had them for the past year or so.  She has one seton in place and is getting several more placed later this month with general surgery.  She has severe ulcerations in a skip pattern in her colon as well.  She seems to metabolize anti-TNF medications very quickly.  I initially tried her on Humira at standard dosing however her therapeutic drug concentration was below threshold and so I doubled it.  Even after doubling her Humira her therapeutic drug concentration was still below threshold.  I then changed her to standard dose Remicade at 5 mg/kg.  Her trough concentration was 1.7.  I increased her Remicade dose to 10 mg/kg and her trough concentration was 3.7.  I fear that she is failing to respond in terms of improvement in her perianal disease and improvement in her colon ulcers due to persistently low drug concentrations even at 10 mg/kg of Remicade.  A more accepted therapeutic cutoff is around 5 mcg/mL and I have only gotten her as high as 3.7 on Remicade at 10 mg/kg.  She is also on max dose immunomodulators with azathioprine at 2.5 mg/kg.  She is not  improving.  Her perianal disease is still severe.  She is going to undergo more seton placements and I fear that she may eventually need a diverting ileostomy.  That would require surgery and then possibly another surgery to takedown the ileostomy.  All the while I would not be surprised if she developed pelvic abscess.  I would like to try to increase her circulating Remicade drug concentration to get it at least higher than a trough level of 5 mcg/kg.  Please reconsider authorizing that she have Remicade 10 mg/kg every 6 weeks instead of every 8 weeks.   Thank you

## 2019-01-15 NOTE — Telephone Encounter (Signed)
Letter faxed as well as office visits, procedure report and labs to [phone] appeal number [id number]

## 2019-01-19 ENCOUNTER — Ambulatory Visit (HOSPITAL_COMMUNITY)
Admission: RE | Admit: 2019-01-19 | Discharge: 2019-01-19 | Disposition: A | Discharge status: Home / Self Care | Payer: 59 | Source: Ambulatory Visit | Attending: Gastroenterology | Admitting: Gastroenterology

## 2019-01-19 ENCOUNTER — Other Ambulatory Visit: Payer: Self-pay

## 2019-01-19 ENCOUNTER — Telehealth: Payer: Self-pay | Admitting: Gastroenterology

## 2019-01-19 DIAGNOSIS — K50119 Crohn's disease of large intestine with unspecified complications: Secondary | ICD-10-CM | POA: Diagnosis not present

## 2019-01-19 MED ORDER — ACETAMINOPHEN 325 MG PO TABS
650.0000 mg | ORAL_TABLET | Freq: Once | ORAL | Status: DC
  Filled 2019-01-19: qty 2

## 2019-01-19 MED ORDER — DIPHENHYDRAMINE HCL 25 MG PO CAPS
25.0000 mg | ORAL_CAPSULE | Freq: Once | ORAL | Status: DC
  Filled 2019-01-19: qty 1

## 2019-01-19 MED ORDER — SODIUM CHLORIDE 0.9 % IV SOLN
INTRAVENOUS | Status: DC | PRN
  Administered 2019-01-19: 250 mL via INTRAVENOUS

## 2019-01-19 MED ORDER — SODIUM CHLORIDE 0.9 % IV SOLN
700.0000 mg | INTRAVENOUS | Status: DC
  Administered 2019-01-19: 700 mg via INTRAVENOUS
  Filled 2019-01-19: qty 70

## 2019-01-19 NOTE — Progress Notes (Signed)
Patient Care Center Note   Diagnosis: Crohn's Disease   Provider: Milus Banister MD   Procedure: IV Remicade   Note: Patient received IV Remicade via PIV and tolerated well. Vitals were monitor during course of treatment and were stable. Discharge instructions given. Patient alert, oriented and ambulatory at time of discharge.

## 2019-01-19 NOTE — Telephone Encounter (Signed)
Pt would like a call back regarding her infusion.

## 2019-01-19 NOTE — Discharge Instructions (Signed)
Infliximab injection What is this medicine? INFLIXIMAB (in [place] i mab) is used to treat Crohn's disease and ulcerative colitis. It is also used to treat ankylosing spondylitis, plaque psoriasis, and some forms of arthritis. This medicine may be used for other purposes; ask your health care provider or pharmacist if you have questions. COMMON BRAND NAME(S): INFLECTRA, Remicade, RENFLEXIS What should I tell my health care provider before I take this medicine? They need to know if you have any of these conditions:  cancer  current or past resident of [place] or [place]  diabetes  exposure to tuberculosis  Guillain-Barre syndrome  heart failure  hepatitis or liver disease  immune system problems  infection  lung or breathing disease, like COPD  multiple sclerosis  receiving phototherapy for the skin  seizure disorder  an unusual or allergic reaction to infliximab, mouse proteins, other medicines, foods, dyes, or preservatives  pregnant or trying to get pregnant  breast-feeding How should I use this medicine? This medicine is for injection into a vein. It is usually given by a health care professional in a hospital or clinic setting. A special MedGuide will be given to you by the pharmacist with each prescription and refill. Be sure to read this information carefully each time. Talk to your pediatrician regarding the use of this medicine in children. While this drug may be prescribed for children as young as 81 years of age for selected conditions, precautions do apply. Overdosage: If you think you have taken too much of this medicine contact a poison control center or emergency room at once. NOTE: This medicine is only for you. Do not share this medicine with others. What if I miss a dose? It is important not to miss your dose. Call your doctor or health care professional if you are unable to keep an appointment. What may interact with this medicine? Do not  take this medicine with any of the following medications:  biologic medicines such as abatacept, adalimumab, anakinra, certolizumab, etanercept, golimumab, rituximab, secukinumab, tocilizumab, tofactinib, ustekinumab  live vaccines This list may not describe all possible interactions. Give your health care provider a list of all the medicines, herbs, non-prescription drugs, or dietary supplements you use. Also tell them if you smoke, drink alcohol, or use illegal drugs. Some items may interact with your medicine. What should I watch for while using this medicine? Your condition will be monitored carefully while you are receiving this medicine. Visit your doctor or health care professional for regular checks on your progress. You may need blood work done while you are taking this medicine. Before beginning therapy, your doctor may do a test to see if you have been exposed to tuberculosis. Call your doctor or health care professional for advice if you get a fever, chills or sore throat, or other symptoms of a cold or flu. Do not treat yourself. This drug decreases your body's ability to fight infections. Try to avoid being around people who are sick. This medicine may make the symptoms of heart failure worse in some patients. If you notice symptoms such as increased shortness of breath or swelling of the ankles or legs, contact your health care provider right away. If you are going to have surgery or dental work, tell your health care professional or dentist that you have received this medicine. If you take this medicine for plaque psoriasis, stay out of the sun. If you cannot avoid being in the sun, wear protective clothing and use sunscreen. Do not  use sun lamps or tanning beds/booths. Talk to your doctor about your risk of cancer. You may be more at risk for certain types of cancers if you take this medicine. What side effects may I notice from receiving this medicine? Side effects that you should  report to your doctor or health care professional as soon as possible:  allergic reactions like skin rash, itching or hives, swelling of the face, lips, or tongue  breathing problems  changes in vision  chest pain  fever or chills, usually related to the infusion  joint pain  pain, tingling, numbness in the hands or feet  redness, blistering, peeling or loosening of the skin, including inside the mouth  seizures  signs of infection - fever or chills, cough, sore throat, flu-like symptoms, pain or difficulty passing urine  signs and symptoms of liver injury like dark yellow or brown urine; general ill feeling; light-colored stools; loss of appetite; nausea; right upper belly pain; unusually weak or tired; yellowing of the eyes or skin  signs and symptoms of a stroke like changes in vision; confusion; trouble speaking or understanding; severe headaches; sudden numbness or weakness of the face, arm or leg; trouble walking; dizziness; loss of balance or coordination  swelling of the ankles, feet, or hands  swollen lymph nodes in the neck, underarm, or groin areas  unusual bleeding or bruising  unusually weak or tired Side effects that usually do not require medical attention (report to your doctor or health care professional if they continue or are bothersome):  headache  nausea  stomach pain  upset stomach This list may not describe all possible side effects. Call your doctor for medical advice about side effects. You may report side effects to FDA at 1-800-FDA-1088. Where should I keep my medicine? This drug is given in a hospital or clinic and will not be stored at home. NOTE: This sheet is a summary. It may not cover all possible information. If you have questions about this medicine, talk to your doctor, pharmacist, or health care provider.  2020 Elsevier/Gold Standard (2016-06-13 13:45:32)

## 2019-01-19 NOTE — Telephone Encounter (Signed)
Tried to reach pt back but the line says not in service   Called the home number and did reach the pt.  She tells me she has spoken with Dr Ardis Hughs and has no further questions.

## 2019-01-20 ENCOUNTER — Other Ambulatory Visit (HOSPITAL_COMMUNITY)
Admission: RE | Admit: 2019-01-20 | Discharge: 2019-01-20 | Disposition: A | Discharge status: Home / Self Care | Payer: 59 | Source: Ambulatory Visit | Attending: Surgery | Admitting: Surgery

## 2019-01-20 DIAGNOSIS — Z20828 Contact with and (suspected) exposure to other viral communicable diseases: Secondary | ICD-10-CM | POA: Insufficient documentation

## 2019-01-20 DIAGNOSIS — Z01812 Encounter for preprocedural laboratory examination: Secondary | ICD-10-CM | POA: Insufficient documentation

## 2019-01-20 LAB — SARS CORONAVIRUS 2 (TAT 6-24 HRS): SARS Coronavirus 2: NEGATIVE

## 2019-01-22 ENCOUNTER — Other Ambulatory Visit: Payer: Self-pay

## 2019-01-22 ENCOUNTER — Encounter (HOSPITAL_BASED_OUTPATIENT_CLINIC_OR_DEPARTMENT_OTHER): Payer: Self-pay | Admitting: *Deleted

## 2019-01-22 NOTE — Progress Notes (Addendum)
Spoke w/ pt via phone for pre-op interview.  Npo after mn.   Arrive at 0530.  Needs CBCdiff and CMP.  Current ekg in chart and epic.  Pt had covid test done 01-20-2019.  Will take cartia and toprol am dos w/ sips of water.  Pt verbalized understanding to do one fleet enema tonight and one morning of surgery (pt lives in [place] she is bringing her fleet enema with her dos to do when she arrives to pre-op).  Pt cardiologist lov note dated 07-11-2018 and telephone clearance dated 12-10-2018 in chart and epic.  Chart to be reviewed by anesthesia, Konrad Felix PA.   PCP -   dr Scarlette Calico Cardiologist -  dr Curt Bears GI--- dr Ardis Hughs  Chest x-ray - 07-18-2015 epic EKG -   07-11-2018 epic Stress Test -  no ECHO -  10-08-2012  epic Cardiac Cath -  no  Sleep Study -  no CPAP -  no  Fasting Blood Sugar -  N/A Checks Blood Sugar _____ times a day  Blood Thinner Instructions:  Eliquis, managed by cardiology, telephone clearance in epic 12-10-2018, pt given instructions by cardiologist office  Aspirin Instructions:  n/a Last Dose:  per pt last eliquis dose Monday 01-19-2019 evening  Anesthesia review: hx PAF,  pt denies any cardiac s&s, no sob or difficulty breathing, and no peripheral swelling  Patient denies shortness of breath, fever, cough and chest pain at phone interview.   ADDENDUM:  Per anesthesia ok to proceed.

## 2019-01-23 ENCOUNTER — Encounter (HOSPITAL_BASED_OUTPATIENT_CLINIC_OR_DEPARTMENT_OTHER): Payer: Self-pay

## 2019-01-23 ENCOUNTER — Ambulatory Visit (HOSPITAL_BASED_OUTPATIENT_CLINIC_OR_DEPARTMENT_OTHER): Payer: 59 | Admitting: Physician Assistant

## 2019-01-23 ENCOUNTER — Encounter (HOSPITAL_BASED_OUTPATIENT_CLINIC_OR_DEPARTMENT_OTHER)
Admission: RE | Disposition: A | Discharge status: Home / Self Care | Payer: Self-pay | Source: Home / Self Care | Attending: Surgery

## 2019-01-23 ENCOUNTER — Ambulatory Visit (HOSPITAL_BASED_OUTPATIENT_CLINIC_OR_DEPARTMENT_OTHER)
Admission: RE | Admit: 2019-01-23 | Discharge: 2019-01-23 | Disposition: A | Discharge status: Home / Self Care | Payer: 59 | Attending: Surgery | Admitting: Surgery

## 2019-01-23 DIAGNOSIS — Z7901 Long term (current) use of anticoagulants: Secondary | ICD-10-CM | POA: Diagnosis not present

## 2019-01-23 DIAGNOSIS — I480 Paroxysmal atrial fibrillation: Secondary | ICD-10-CM | POA: Insufficient documentation

## 2019-01-23 DIAGNOSIS — K5010 Crohn's disease of large intestine without complications: Secondary | ICD-10-CM | POA: Diagnosis not present

## 2019-01-23 DIAGNOSIS — K610 Anal abscess: Secondary | ICD-10-CM | POA: Diagnosis not present

## 2019-01-23 DIAGNOSIS — K50114 Crohn's disease of large intestine with abscess: Secondary | ICD-10-CM | POA: Insufficient documentation

## 2019-01-23 DIAGNOSIS — E785 Hyperlipidemia, unspecified: Secondary | ICD-10-CM | POA: Diagnosis not present

## 2019-01-23 DIAGNOSIS — I10 Essential (primary) hypertension: Secondary | ICD-10-CM | POA: Diagnosis not present

## 2019-01-23 HISTORY — PX: RECTAL EXAM UNDER ANESTHESIA: SHX6399

## 2019-01-23 HISTORY — PX: PLACEMENT OF SETON: SHX6029

## 2019-01-23 LAB — CBC WITH DIFFERENTIAL/PLATELET
Abs Immature Granulocytes: 0.05 10*3/uL (ref 0.00–0.07)
Basophils Absolute: 0 10*3/uL (ref 0.0–0.1)
Basophils Relative: 0 %
Eosinophils Absolute: 0.1 10*3/uL (ref 0.0–0.5)
Eosinophils Relative: 1 %
HCT: 41.7 % (ref 36.0–46.0)
Hemoglobin: 13.5 g/dL (ref 12.0–15.0)
Immature Granulocytes: 1 %
Lymphocytes Relative: 7 %
Lymphs Abs: 0.7 10*3/uL (ref 0.7–4.0)
MCH: 31.3 pg (ref 26.0–34.0)
MCHC: 32.4 g/dL (ref 30.0–36.0)
MCV: 96.5 fL (ref 80.0–100.0)
Monocytes Absolute: 0.6 10*3/uL (ref 0.1–1.0)
Monocytes Relative: 6 %
Neutro Abs: 7.6 10*3/uL (ref 1.7–7.7)
Neutrophils Relative %: 85 %
Platelets: 366 10*3/uL (ref 150–400)
RBC: 4.32 MIL/uL (ref 3.87–5.11)
RDW: 14.6 % (ref 11.5–15.5)
WBC: 9 10*3/uL (ref 4.0–10.5)
nRBC: 0 % (ref 0.0–0.2)

## 2019-01-23 LAB — COMPREHENSIVE METABOLIC PANEL
ALT: 38 U/L (ref 0–44)
AST: 31 U/L (ref 15–41)
Albumin: 4.5 g/dL (ref 3.5–5.0)
Alkaline Phosphatase: 136 U/L — ABNORMAL HIGH (ref 38–126)
Anion gap: 11 (ref 5–15)
BUN: 15 mg/dL (ref 8–23)
CO2: 27 mmol/L (ref 22–32)
Calcium: 9.6 mg/dL (ref 8.9–10.3)
Chloride: 103 mmol/L (ref 98–111)
Creatinine, Ser: 0.73 mg/dL (ref 0.44–1.00)
GFR calc Af Amer: >60 mL/min (ref >60)
GFR calc non Af Amer: >60 mL/min (ref >60)
Glucose, Bld: 104 mg/dL — ABNORMAL HIGH (ref 70–99)
Potassium: 3.7 mmol/L (ref 3.5–5.1)
Sodium: 141 mmol/L (ref 135–145)
Total Bilirubin: 0.5 mg/dL (ref 0.3–1.2)
Total Protein: 8.3 g/dL — ABNORMAL HIGH (ref 6.5–8.1)

## 2019-01-23 SURGERY — EXAM UNDER ANESTHESIA, RECTUM
Anesthesia: General | Site: Anus

## 2019-01-23 MED ORDER — ROCURONIUM BROMIDE 10 MG/ML (PF) SYRINGE
PREFILLED_SYRINGE | INTRAVENOUS | Status: AC
  Filled 2019-01-23: qty 10

## 2019-01-23 MED ORDER — ACETAMINOPHEN 500 MG PO TABS
1000.0000 mg | ORAL_TABLET | ORAL | Status: AC
  Administered 2019-01-23: 1000 mg via ORAL
  Filled 2019-01-23: qty 2

## 2019-01-23 MED ORDER — ACETAMINOPHEN 500 MG PO TABS
ORAL_TABLET | ORAL | Status: AC
  Filled 2019-01-23: qty 2

## 2019-01-23 MED ORDER — MIDAZOLAM HCL 2 MG/2ML IJ SOLN
INTRAMUSCULAR | Status: AC
  Filled 2019-01-23: qty 2

## 2019-01-23 MED ORDER — ACETAMINOPHEN 500 MG PO TABS
1000.0000 mg | ORAL_TABLET | Freq: Once | ORAL | Status: DC
  Filled 2019-01-23: qty 2

## 2019-01-23 MED ORDER — BUPIVACAINE-EPINEPHRINE 0.25% -1:200000 IJ SOLN
INTRAMUSCULAR | Status: DC | PRN
  Administered 2019-01-23: 30 mL

## 2019-01-23 MED ORDER — ONDANSETRON HCL 4 MG/2ML IJ SOLN
INTRAMUSCULAR | Status: DC | PRN
  Administered 2019-01-23: 4 mg via INTRAVENOUS

## 2019-01-23 MED ORDER — LIDOCAINE 2% (20 MG/ML) 5 ML SYRINGE
INTRAMUSCULAR | Status: AC
  Filled 2019-01-23: qty 5

## 2019-01-23 MED ORDER — BUPIVACAINE LIPOSOME 1.3 % IJ SUSP
INTRAMUSCULAR | Status: DC | PRN
  Administered 2019-01-23: 20 mL

## 2019-01-23 MED ORDER — ARTIFICIAL TEARS OPHTHALMIC OINT
TOPICAL_OINTMENT | OPHTHALMIC | Status: AC
  Filled 2019-01-23: qty 3.5

## 2019-01-23 MED ORDER — PROMETHAZINE HCL 25 MG/ML IJ SOLN
6.2500 mg | INTRAMUSCULAR | Status: DC | PRN
  Filled 2019-01-23: qty 1

## 2019-01-23 MED ORDER — FENTANYL CITRATE (PF) 100 MCG/2ML IJ SOLN
25.0000 ug | INTRAMUSCULAR | Status: DC | PRN
  Filled 2019-01-23: qty 1

## 2019-01-23 MED ORDER — SCOPOLAMINE 1 MG/3DAYS TD PT72
MEDICATED_PATCH | TRANSDERMAL | Status: AC
  Filled 2019-01-23: qty 1

## 2019-01-23 MED ORDER — DEXAMETHASONE SODIUM PHOSPHATE 10 MG/ML IJ SOLN
INTRAMUSCULAR | Status: DC | PRN
  Administered 2019-01-23: 10 mg via INTRAVENOUS

## 2019-01-23 MED ORDER — CHLORHEXIDINE GLUCONATE CLOTH 2 % EX PADS
6.0000 | MEDICATED_PAD | Freq: Once | CUTANEOUS | Status: DC
  Filled 2019-01-23: qty 6

## 2019-01-23 MED ORDER — MIDAZOLAM HCL 5 MG/5ML IJ SOLN
INTRAMUSCULAR | Status: DC | PRN
  Administered 2019-01-23: 2 mg via INTRAVENOUS

## 2019-01-23 MED ORDER — ROCURONIUM BROMIDE 50 MG/5ML IV SOSY
PREFILLED_SYRINGE | INTRAVENOUS | Status: DC | PRN
  Administered 2019-01-23: 50 mg via INTRAVENOUS

## 2019-01-23 MED ORDER — ONDANSETRON HCL 4 MG/2ML IJ SOLN
INTRAMUSCULAR | Status: AC
  Filled 2019-01-23: qty 2

## 2019-01-23 MED ORDER — LACTATED RINGERS IV SOLN
INTRAVENOUS | Status: DC
  Administered 2019-01-23: 07:00:00 via INTRAVENOUS
  Filled 2019-01-23: qty 1000

## 2019-01-23 MED ORDER — FENTANYL CITRATE (PF) 100 MCG/2ML IJ SOLN
INTRAMUSCULAR | Status: AC
  Filled 2019-01-23: qty 2

## 2019-01-23 MED ORDER — GABAPENTIN 300 MG PO CAPS
300.0000 mg | ORAL_CAPSULE | ORAL | Status: AC
  Administered 2019-01-23: 06:00:00 300 mg via ORAL
  Filled 2019-01-23: qty 1

## 2019-01-23 MED ORDER — DEXAMETHASONE SODIUM PHOSPHATE 10 MG/ML IJ SOLN
INTRAMUSCULAR | Status: AC
  Filled 2019-01-23: qty 1

## 2019-01-23 MED ORDER — EPHEDRINE SULFATE 50 MG/ML IJ SOLN
INTRAMUSCULAR | Status: DC | PRN
  Administered 2019-01-23: 10 mg via INTRAVENOUS

## 2019-01-23 MED ORDER — SUGAMMADEX SODIUM 200 MG/2ML IV SOLN
INTRAVENOUS | Status: DC | PRN
  Administered 2019-01-23: 140 mg via INTRAVENOUS

## 2019-01-23 MED ORDER — PROPOFOL 10 MG/ML IV BOLUS
INTRAVENOUS | Status: DC | PRN
  Administered 2019-01-23: 120 mg via INTRAVENOUS

## 2019-01-23 MED ORDER — FENTANYL CITRATE (PF) 100 MCG/2ML IJ SOLN
INTRAMUSCULAR | Status: DC | PRN
  Administered 2019-01-23: 100 ug via INTRAVENOUS

## 2019-01-23 MED ORDER — PROPOFOL 10 MG/ML IV BOLUS
INTRAVENOUS | Status: AC
  Filled 2019-01-23: qty 40

## 2019-01-23 MED ORDER — SODIUM CHLORIDE 0.9 % IR SOLN
Status: DC | PRN
  Administered 2019-01-23: 500 mL

## 2019-01-23 MED ORDER — BUPIVACAINE LIPOSOME 1.3 % IJ SUSP
20.0000 mL | Freq: Once | INTRAMUSCULAR | Status: DC
  Filled 2019-01-23: qty 20

## 2019-01-23 MED ORDER — SCOPOLAMINE 1 MG/3DAYS TD PT72
1.0000 | MEDICATED_PATCH | Freq: Once | TRANSDERMAL | Status: DC
  Administered 2019-01-23: 07:00:00 1.5 mg via TRANSDERMAL
  Filled 2019-01-23: qty 1

## 2019-01-23 MED ORDER — MIDAZOLAM HCL 2 MG/2ML IJ SOLN
0.5000 mg | Freq: Once | INTRAMUSCULAR | Status: DC | PRN
  Filled 2019-01-23: qty 2

## 2019-01-23 MED ORDER — KETOROLAC TROMETHAMINE 30 MG/ML IJ SOLN
INTRAMUSCULAR | Status: DC | PRN
  Administered 2019-01-23: 30 mg via INTRAVENOUS

## 2019-01-23 MED ORDER — MEPERIDINE HCL 25 MG/ML IJ SOLN
6.2500 mg | INTRAMUSCULAR | Status: DC | PRN
  Filled 2019-01-23: qty 1

## 2019-01-23 MED ORDER — LIDOCAINE 2% (20 MG/ML) 5 ML SYRINGE
INTRAMUSCULAR | Status: DC | PRN
  Administered 2019-01-23: 40 mg via INTRAVENOUS

## 2019-01-23 MED ORDER — OXYCODONE HCL 5 MG PO TABS: 5.0000 mg | ORAL_TABLET | Freq: Four times a day (QID) | ORAL | 0 refills | Status: AC | PRN

## 2019-01-23 MED ORDER — GABAPENTIN 300 MG PO CAPS
ORAL_CAPSULE | ORAL | Status: AC
  Filled 2019-01-23: qty 1

## 2019-01-23 MED FILL — oxyCODONE HCL 5 MG TABS: 5 | 5 days supply | Qty: 20 | Fill #0

## 2019-01-23 SURGICAL SUPPLY — 61 items
BENZOIN TINCTURE PRP APPL 2/3 (GAUZE/BANDAGES/DRESSINGS) ×3 IMPLANT
BLADE EXTENDED COATED 6.5IN (ELECTRODE) IMPLANT
BLADE HEX COATED 2.75 (ELECTRODE) ×2 IMPLANT
BLADE SURG 10 STRL SS (BLADE) IMPLANT
BLADE SURG 15 STRL LF DISP TIS (BLADE) ×1 IMPLANT
BLADE SURG 15 STRL SS (BLADE) ×1
BRIEF STRETCH FOR OB PAD LRG (UNDERPADS AND DIAPERS) ×5 IMPLANT
CANISTER SUCT 3000ML PPV (MISCELLANEOUS) ×2 IMPLANT
COVER BACK TABLE 60X90IN (DRAPES) ×2 IMPLANT
COVER MAYO STAND STRL (DRAPES) ×2 IMPLANT
COVER WAND RF STERILE (DRAPES) ×3 IMPLANT
DECANTER SPIKE VIAL GLASS SM (MISCELLANEOUS) ×2 IMPLANT
DRAPE LAPAROTOMY 100X72 PEDS (DRAPES) ×2 IMPLANT
DRAPE UTILITY XL STRL (DRAPES) ×2 IMPLANT
GAUZE SPONGE 4X4 12PLY STRL (GAUZE/BANDAGES/DRESSINGS) ×2 IMPLANT
GAUZE SPONGE 4X4 12PLY STRL LF (GAUZE/BANDAGES/DRESSINGS) ×1 IMPLANT
GLOVE BIO SURGEON STRL SZ7.5 (GLOVE) ×1 IMPLANT
GLOVE INDICATOR 8.0 STRL GRN (GLOVE) ×1 IMPLANT
GLOVE SURG SS PI 6.5 STRL IVOR (GLOVE) ×2 IMPLANT
GLOVE SURG SS PI 7.5 STRL IVOR (GLOVE) ×2 IMPLANT
GOWN STRL REUS W/TWL LRG LVL3 (GOWN DISPOSABLE) ×3 IMPLANT
HYDROGEN PEROXIDE 16OZ (MISCELLANEOUS) IMPLANT
IV CATH 14GX2 1/4 (CATHETERS) IMPLANT
IV CATH 18G SAFETY (IV SOLUTION) IMPLANT
KIT SIGMOIDOSCOPE (SET/KITS/TRAYS/PACK) IMPLANT
KIT TURNOVER CYSTO (KITS) ×2 IMPLANT
LOOP VESSEL MAXI BLUE (MISCELLANEOUS) ×1 IMPLANT
NDL SAFETY ECLIPSE 18X1.5 (NEEDLE) IMPLANT
NEEDLE HYPO 18GX1.5 SHARP (NEEDLE)
NEEDLE HYPO 22GX1.5 SAFETY (NEEDLE) ×2 IMPLANT
NS IRRIG 500ML POUR BTL (IV SOLUTION) ×2 IMPLANT
PACK BASIN DAY SURGERY FS (CUSTOM PROCEDURE TRAY) ×2 IMPLANT
PAD ABD 8X10 STRL (GAUZE/BANDAGES/DRESSINGS) ×4 IMPLANT
PAD ARMBOARD 7.5X6 YLW CONV (MISCELLANEOUS) IMPLANT
PENCIL BUTTON HOLSTER BLD 10FT (ELECTRODE) ×2 IMPLANT
SPONGE HEMORRHOID 8X3CM (HEMOSTASIS) IMPLANT
SPONGE SURGIFOAM ABS GEL 12-7 (HEMOSTASIS) IMPLANT
SUCTION FRAZIER HANDLE 10FR (MISCELLANEOUS)
SUCTION TUBE FRAZIER 10FR DISP (MISCELLANEOUS) IMPLANT
SUT CHROMIC 2 0 SH (SUTURE) IMPLANT
SUT CHROMIC 3 0 SH 27 (SUTURE) IMPLANT
SUT MNCRL AB 4-0 PS2 18 (SUTURE) IMPLANT
SUT SILK 0 PSL NDL (SUTURE) IMPLANT
SUT SILK 0 TIES 10X30 (SUTURE) IMPLANT
SUT SILK 2 0 (SUTURE)
SUT SILK 2-0 18XBRD TIE 12 (SUTURE) IMPLANT
SUT VIC AB 2-0 SH 27 (SUTURE)
SUT VIC AB 2-0 SH 27XBRD (SUTURE) IMPLANT
SUT VIC AB 3-0 SH 18 (SUTURE) IMPLANT
SUT VIC AB 3-0 SH 27 (SUTURE)
SUT VIC AB 3-0 SH 27X BRD (SUTURE) IMPLANT
SUT VIC AB 3-0 SH 27XBRD (SUTURE) IMPLANT
SUT VIC AB 4-0 P-3 18XBRD (SUTURE) IMPLANT
SUT VIC AB 4-0 P3 18 (SUTURE)
SYR 20CC LL (SYRINGE) IMPLANT
SYR BULB IRRIGATION 50ML (SYRINGE) ×2 IMPLANT
SYR CONTROL 10ML LL (SYRINGE) ×2 IMPLANT
TOWEL OR 17X26 10 PK STRL BLUE (TOWEL DISPOSABLE) ×2 IMPLANT
TRAY DSU PREP LF (CUSTOM PROCEDURE TRAY) ×2 IMPLANT
TUBE CONNECTING 12X1/4 (SUCTIONS) ×2 IMPLANT
YANKAUER SUCT BULB TIP NO VENT (SUCTIONS) ×2 IMPLANT

## 2019-01-23 NOTE — Anesthesia Preprocedure Evaluation (Addendum)
Anesthesia Evaluation  Patient identified by MRN, date of birth, ID band Patient awake    Reviewed: Allergy & Precautions, NPO status , Patient's Chart, lab work & pertinent test results, reviewed documented beta blocker date and time   History of Anesthesia Complications Negative for: history of anesthetic complications  Airway Mallampati: II  TM Distance: >3 FB Neck ROM: Full    Dental  (+) Dental Advisory Given   Pulmonary neg pulmonary ROS,  01/20/2019 SARS coronavirus NEG   breath sounds clear to auscultation       Cardiovascular hypertension, Pt. on medications and Pt. on home beta blockers (-) angina+ dysrhythmias Atrial Fibrillation  Rhythm:Regular Rate:Normal  '17 ECHO: EF 55-60%, valves OK   Neuro/Psych negative neurological ROS     GI/Hepatic negative GI ROS, Neg liver ROS, Crohn's   Endo/Other  negative endocrine ROS  Renal/GU negative Renal ROS     Musculoskeletal  (+) Arthritis ,   Abdominal   Peds  Hematology Eliquis: last taken 8/25   Anesthesia Other Findings   Reproductive/Obstetrics                            Anesthesia Physical Anesthesia Plan  ASA: III  Anesthesia Plan: General   Post-op Pain Management:    Induction: Intravenous  PONV Risk Score and Plan: 3 and Ondansetron, Dexamethasone and Scopolamine patch - Pre-op  Airway Management Planned: Oral ETT  Additional Equipment:   Intra-op Plan:   Post-operative Plan: Extubation in OR  Informed Consent: I have reviewed the patients History and Physical, chart, labs and discussed the procedure including the risks, benefits and alternatives for the proposed anesthesia with the patient or authorized representative who has indicated his/her understanding and acceptance.     Dental advisory given  Plan Discussed with: CRNA and Surgeon  Anesthesia Plan Comments:         Anesthesia Quick  Evaluation

## 2019-01-23 NOTE — Op Note (Signed)
01/23/2019  8:38 AM  PATIENT:  Julie Clark  63 y.o. female  Patient Care Team: Janith Lima, MD as PCP - General (Internal Medicine) Constance Haw, MD as PCP - Cardiology (Cardiology) Michael Boston, MD as Consulting Physician (General Surgery) Milus Banister, MD as Attending Physician (Gastroenterology)  PRE-OPERATIVE DIAGNOSIS:  Perianal Crohn's disease  POST-OPERATIVE DIAGNOSIS:  Same  PROCEDURE:   1. Placement of perianal setons - four separate setons 2. Incision and drainage of perianal abscesses - two separate abscess cavities\ 3. Anorectal exam under anesthesia  SURGEON:  Surgeon(s): Ileana Roup, MD   ANESTHESIA:   general  SPECIMEN:  Culture of pus from perianal abscess  DISPOSITION OF SPECIMEN:  PATHOLOGY  COUNTS:  Sponge, needle, and instrument counts were reported correct x2 at conclusion.  EBL: 5 mL  Drains: None  PLAN OF CARE: Discharge to home after PACU  PATIENT DISPOSITION:  PACU - hemodynamically stable.  INDICATION: Ms. Julie Clark is a very pleasant 11yoF with hx of afib (now on Eliquis but sinus rhythm), perianal Crohn's disease - was taken to OR by my partner 10/11/2017 and underwent anorectal EUA, drainage of perirectal abscesses, placement of seton, external hemorrhoidectomy, internal hemorrhoid ligation. Pathology on these returned benign. She recovered reasonably well from all this but has had ongoing issues with recurrent perianal abscesses/drainage in various locations. She has been followed with Dr. Ardis Hughs. She has been diagnosed with Crohn's disease She underwent colonoscopy 12/2012 which showed inflammation in the cecum and ascending colon was biopsied but felt to be due to NSAID use at that time. Terminal ileum was normal. No polyps or cancers. Subsequent colonoscopy 09/2017 showed large external hemorrhoids with sinus tracts evident. Terminal ileum was normal. Biopsies were taken. Moderate to severe inflammation  throughout the colon. Mucosa throughout the colon is fairly granular and mildly inflamed. Several areas of deep ulceration. The rectum was very inflamed and ulcerated.  The most recent colonoscopy 04/2018 showed focally ulcerated stricture-like segment in the ascending colon which was biopsied. Segmental inflammation presumably in the transverse or hepatic flexure was also seen 10 cm distal to the stricture. Biopsied. Perianal area was a bit improved with seton noted. -Pathology showed right colon-chronic mildly active colitis. Additional right colon-chronic mildly active colitis. No dysplasia or malignancy was noted on any of her pathology.  She has been following with Dr. Ardis Hughs for medical management of her disease. She was initially started on dual therapy with immunological layers-as a fibrin and Humira 09/2017. She had a fairly poor response with regards to her perianal disease as of November 2019. Her Humira dosing was increased as it is subtherapeutic. Despite this, her levels were still unacceptable and she was ultimately changed to Remicade. April 2020, suboptimal levels and dose increased. Her levels remain just below therapeutic range with plans to increase the dose this month.  She reports ongoing perianal drainage and discomfort which is been stable over the last 6 months. She has purulent drainage from multiple locations.   She was evalauted in the office and we had discussed all options going forward. Her perianal disease appeared fairly significant and severe. We had discussed diverting ostomy. She remains very adamant at this point that she does not want any surgery that involves a stoma. We discussed that with her advanced disease, this would likely provide the most relief. She opted to pursue further EUA and possible seton placement.  OR FINDINGS: Multiple perianal chronic sinuses. None of these new ones appeared to communicate with  the anal canal. 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 2 of these chonic sinuses had closed and had abscess like features - they were drained - these were high on the buttocks overlying sacrum. Edematous tags in perianal area. 4 setons placed through subcutaneous sinus cavities.  DESCRIPTION: The patient was identified in the preoperative holding area and taken to the OR where she then underwent general endotracheal anesthesia after SCDs were placed.  She was then placed prone on the operating room table.  Pressure points were then padded and verified.  The buttocks were then gently separated using an adhesive tape that the OR had previously cleared with the patient-paper tape and had confirmed she was not allergic to this kind of tape.  She was then prepped and draped in usual sterile fashion.  A surgical timeout was performed indicating the correct patient, procedure, positioning.  A well lubricated digital rectal exam was performed which demonstrated no palpable masses in the anal canal or distal rectum.  Seton palpable and external opening left anterior, internal opening in midline.  A Hill-Ferguson anoscope was into the anal canal and circumferential inspection demonstrated a relatively featureless anal canal with flattened dentate line.  Edematous perianal tags.  No ulcerations or masses.  Internal opening had granulation tissue emanating from it. This seton was exchanged for a new blue vessel loop seton. Multiple perianal/gluteal sinus tracts which did not appear to communicate with the anal canal were found. 2 of these were over the sacrum and were incised/drained and fulgurated the granulation tissue within. 3 additional sinus tracts where amenable to seton placement due to length. This was done with blue vessel loop setons and secured to themselves with 2-0 silk suture. All setons placed were loose, draining setons. Cultures were obtained from one of the abscess cavities. All wounds were  then irrigated and hemostasis verified. Local anesthetic consisting of 0.25% arcaine/epinepherine mixed with Exparel was infiltrated into the wounds. Dibucaine ointment was applied. The perianal area was covered with 4 x 4 gauze, ABD pad and all secured with mesh underwear. She was then transferred back to a stretcher, awakened from anesthesia, extubated and transferred to recovery in stable condition.  DISPOSITION: PACU in satisfactory condition.

## 2019-01-23 NOTE — Transfer of Care (Signed)
Immediate Anesthesia Transfer of Care Note  Patient: Julie Clark  Procedure(s) Performed: ANORECTAL EXAM UNDER ANESTHESIA,  INCISION AND DRAINAGE x2 (N/A Anus) PLACEMENT OF SETONS x4 (N/A Anus)  Patient Location: PACU  Anesthesia Type:General  Level of Consciousness: awake, alert  and oriented  Airway & Oxygen Therapy: Patient Spontanous Breathing and Patient connected to nasal cannula oxygen  Post-op Assessment: Report given to RN  Post vital signs: Reviewed and stable  Last Vitals:  Vitals Value Taken Time  BP 107/93 01/23/19 0850  Temp    Pulse 81 01/23/19 0851  Resp 16 01/23/19 0851  SpO2 100 % 01/23/19 0851  Vitals shown include unvalidated device data.  Last Pain:  Vitals:   01/23/19 0541  TempSrc: Oral  PainSc: 0-No pain      Patients Stated Pain Goal: 5 (17/40/99 2780)  Complications: No apparent anesthesia complications

## 2019-01-23 NOTE — Discharge Instructions (Addendum)
ANORECTAL SURGERY: POST OP INSTRUCTIONS  1. DIET: As tolerated.    2. Take your usually prescribed home medications unless otherwise directed. You may restart your blood thinner on Sunday (01/25/2019).  3. PAIN CONTROL: a. It is helpful to take an over-the-counter pain medication regularly for the first few days/weeks.  Choose from the following that works best for you: i. Ibuprofen (Advil, etc) Three 230m tabs every 6 hours as needed. ii. Acetaminophen (Tylenol, etc) [id number] 6 hours as needed iii. NOTE: You may take both of these medications together - most patients find it most helpful when alternating between the two (i.e. Ibuprofen at 6am, tylenol at 9am, ibuprofen at 12pm ...) b. You may take your home medication - dilaudid - for any discomfort postoepratively, however, following this procedure, things should be reasonably controlled without extra pain medication prescriptions i. If you are having problems/concerns with the prescription medicine, please call [place] further advice.  4. Avoid getting constipated.  Between the surgery and the pain medications, it is common to experience some constipation.  Increasing fluid intake (64oz of water per day) and taking a fiber supplement (such as Metamucil, Citrucel, FiberCon) 1-2 times a day regularly will usually help prevent this problem from occurring.  Take Miralax (over the counter) 1-2x/day while taking a narcotic pain medication. If no bowel movement after 48hours, you may additionally take a laxative like a bottle of Milk of Magnesia which can be purchased over the counter. Avoid enemas if possible as these are often painful.   5. Watch out for diarrhea.  If you have many loose bowel movements, simplify your diet to bland foods.  Stop any stool softeners and decrease your fiber supplement. If this worsens or does not improve, please call [place] 6. Wash / shower every day.  If you were discharged with a dressing, you may remove this the  day after your surgery. You may shower normally, getting soap/water on your wound, particularly after bowel movements.  7. Wound care - you may place 4x4 gauze over the perianal area to help with drainage or a pad. You have 4 separate setons in place at this point which are there to ideally help prevent recurrent abscesses. Things will be sore for the next few days as expected.  8. Soaking in a warm bath filled a couple inches ("Sitz bath") is a great way to clean the area after a bowel movement and many patients find it is a way to soothe the area.  9. ACTIVITIES as tolerated:   a. You may resume regular (light) daily activities beginning the next day--such as daily self-care, walking, climbing stairs--gradually increasing activities as tolerated.  If you can walk 30 minutes without difficulty, it is safe to try more intense activity such as jogging, treadmill, bicycling, low-impact aerobics, etc. b. Refrain from any heavy lifting or straining for the first 2 weeks after your procedure, particularly if your surgery was for hemorrhoids. c. Avoid activities that make your pain worse d. You may drive when you are no longer taking prescription pain medication, you can comfortably wear a seatbelt, and you can safely maneuver your car and apply brakes.  10. FOLLOW UP in our office a. Please call [facility] at [phone] to set up an appointment to see your surgeon in the office for a follow-up appointment approximately 2 weeks after your surgery. b. Make sure that you call for this appointment the day you arrive home to insure a convenient appointment time.  9.  If you have disability or family leave forms that need to be completed, you may have them completed by your primary care physician's office; for return to work instructions, please ask our office staff and they will be happy to assist you in obtaining this documentation   When to call us [phone]: 1. Poor pain control 2. Reactions / problems  with new medications (rash/itching, etc)  3. Fever over 101.5 F (38.5 C) 4. Inability to urinate 5. Nausea/vomiting 6. Worsening swelling or bruising 7. Continued bleeding from incision. 8. Increased pain, redness, or drainage from the incision  The clinic staff is available to answer your questions during regular business hours (8:30am-5pm).  Please dont hesitate to call and ask to speak to one of our nurses for clinical concerns.   A surgeon from [facility] is always on call at the hospitals   If you have a medical emergency, go to the nearest emergency room or call 911.   [facility], [place], [place], [place], [place]  [address] ? MAIN: [phone] FAX [phone] [email or web address]  Post Anesthesia Home Care Instructions  Activity: Get plenty of rest for the remainder of the day. A responsible individual must stay with you for 24 hours following the procedure.  For the next 24 hours, DO NOT: -Drive a car -Paediatric nurse -Drink alcoholic beverages -Take any medication unless instructed by your physician -Make any legal decisions or sign important papers.  Meals: Start with liquid foods such as gelatin or soup. Progress to regular foods as tolerated. Avoid greasy, spicy, heavy foods. If nausea and/or vomiting occur, drink only clear liquids until the nausea and/or vomiting subsides. Call your physician if vomiting continues.  Special Instructions/Symptoms: Your throat may feel dry or sore from the anesthesia or the breathing tube placed in your throat during surgery. If this causes discomfort, gargle with warm salt water. The discomfort should disappear within 24 hours.  If you had a scopolamine patch placed behind your ear for the management of post- operative nausea and/or vomiting:  1. The medication in the patch is effective for 72 hours, after which it should be removed.  Wrap patch in a tissue and discard  in the trash. Wash hands thoroughly with soap and water. 2. You may remove the patch earlier than 72 hours if you experience unpleasant side effects which may include dry mouth, dizziness or visual disturbances. 3. Avoid touching the patch. Wash your hands with soap and water after contact with the patch.    Information for Discharge Teaching: EXPAREL (bupivacaine liposome injectable suspension)   Your surgeon or anesthesiologist gave you EXPAREL(bupivacaine) to help control your pain after surgery.   EXPAREL is a local anesthetic that provides pain relief by numbing the tissue around the surgical site.  EXPAREL is designed to release pain medication over time and can control pain for up to 72 hours.  Depending on how you respond to EXPAREL, you may require less pain medication during your recovery.  Possible side effects:  Temporary loss of sensation or ability to move in the area where bupivacaine was injected.  Nausea, vomiting, constipation  Rarely, numbness and tingling in your mouth or lips, lightheadedness, or anxiety may occur.  Call your doctor right away if you think you may be experiencing any of these sensations, or if you have other questions regarding possible side effects.  Follow all other discharge instructions given to you by your surgeon or nurse. Eat a healthy  diet and drink plenty of water or other fluids.  If you return to the hospital for any reason within 96 hours following the administration of EXPAREL, it is important for health care providers to know that you have received this anesthetic. A teal colored band has been placed on your arm with the date, time and amount of EXPAREL you have received in order to alert and inform your health care providers. Please leave this armband in place for the full 96 hours following administration, and then you may remove the band.

## 2019-01-23 NOTE — Anesthesia Procedure Notes (Signed)
Procedure Name: Intubation Date/Time: 01/23/2019 7:36 AM Performed by: Bonney Aid, CRNA Pre-anesthesia Checklist: Patient identified, Emergency Drugs available, Suction available and Patient being monitored Patient Re-evaluated:Patient Re-evaluated prior to induction Oxygen Delivery Method: Circle system utilized Preoxygenation: Pre-oxygenation with 100% oxygen Induction Type: IV induction Ventilation: Mask ventilation without difficulty Laryngoscope Size: Mac and 3 Grade View: Grade I Tube type: Oral Tube size: 7.0 mm Number of attempts: 1 Airway Equipment and Method: Stylet Placement Confirmation: ETT inserted through vocal cords under direct vision,  positive ETCO2 and breath sounds checked- equal and bilateral Secured at: 22 cm Tube secured with: Tape Dental Injury: Teeth and Oropharynx as per pre-operative assessment

## 2019-01-23 NOTE — H&P (Signed)
CC: Referred by Dr. Ardis Hughs for evaluation of significant perianal crohn's disease  HPI: Ms. Murchison is a very pleasant 51yoF with hx of afib (now on Eliquis but sinus rhythm), perianal Crohn's disease - she was referred to our office initially and so a partner Dr. gross with perianal fistula as well as external hemorrhoids. She is taken to the operating room 10/11/2017 and underwent anorectal examination is the common drainage of perirectal abscesses, placement of seton, external hemorrhoidectomy, internal hemorrhoid ligation. Pathology on these returned benign. She recovered reasonably well from all this but has had ongoing issues with recurrent perianal abscesses/drainage in various locations. She has been followed with Dr. Ardis Hughs. She has been diagnosed with Crohn's disease.  She underwent colonoscopy 12/2012 which showed inflammation in the cecum and ascending colon was biopsied but felt to be due to NSAID use at that time. Terminal ileum was normal. No polyps or cancers. Subsequent colonoscopy 09/2017 showed large external hemorrhoids with sinus tracts evident. Terminal ileum was normal. Biopsies were taken. Moderate to severe inflammation throughout the colon. Mucosa throughout the colon is fairly granular and mildly inflamed. Several areas of deep ulceration. The rectum was very inflamed and ulcerated.  The most recent colonoscopy 04/2018 showed focally ulcerated stricture-like segment in the ascending colon which was biopsied. Segmental inflammation presumably in the transverse or hepatic flexure was also seen 10 cm distal to the stricture. Biopsied. Perianal area was a bit improved with seton noted. -Pathology showed right colon-chronic mildly active colitis. Additional right colon-chronic mildly active colitis. No dysplasia or malignancy was noted on any of her pathology.  She has been following with Dr. Ardis Hughs for medical management of her disease. She was initially started  on dual therapy with immunological layers-as a fibrin and Humira 09/2017. She had a fairly poor response with regards to her perianal disease as of November 2019. Her Humira dosing was increased as it is subtherapeutic. Despite this, her levels were still unacceptable and she was ultimately changed to Remicade. April 2020, suboptimal levels and dose increased. Her levels remain just below therapeutic range with plans to increase the dose this month.  She reports ongoing perianal drainage and discomfort which is been stable over the last 6 months. She has purulent drainage from multiple locations. She denies any fever/chills. She reports her only symptom is being discomfort in the perianal area. She denies abdominal pain, nausea/vomiting, changes in bowel habits.  PMH: Afib (on Eliquis); Crohn's disease  PSH: C-sx x2 Hemorrhoidectomy, seton 09/2017 - Dr. Johney Maine  FHx: Denies FHx of malignancy  Social: Denies use of tobacco/EtOH/drugs. 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  ROS: A comprehensive 10 system review of systems was completed with the patient and pertinent findings as noted above  Past Medical History:  Diagnosis Date   Anticoagulant long-term use    eliquis, managed by cardiology   Crohn's disease of perianal region [facility])    new dx 05/ 2019 by dr Ardis Hughs ([facility])   Heart murmur    Hemorrhoids    internal and external   History of hidradenitis suppurativa    axilla   Hyperlipidemia    Hypertension    PAF (paroxysmal atrial fibrillation) [facility])    cardiologist-  dr Curt Bears--  first dx 07-18-2015   Rectal pain    Uterine fibroid    per MRI 09-16-2017   Wears glasses    Wears glasses     Past Surgical History:  Procedure Laterality Date  CESAREAN SECTION  x2  last one 1996   COLONOSCOPY WITH PROPOFOL  last one 05-14-2018  dr d. Ardis Hughs @ [facility]   INCISION AND DRAINAGE ABSCESS N/A 10/11/2017   Procedure: DRAINAGE OF  PERIRECTAL ABSCESSES;  Surgeon: Michael Boston, MD;  Location: [place];  Service: General;  Laterality: N/A;   PLACEMENT OF SETON N/A 10/11/2017   Procedure: PLACEMENT OF SETON;  Surgeon: Michael Boston, MD;  Location: [place];  Service: General;  Laterality: N/A;   RECTAL EXAM UNDER ANESTHESIA N/A 10/11/2017   Procedure: ANORECTAL EXAM UNDER ANESTHESIA;  Surgeon: Michael Boston, MD;  Location: [place];  Service: General;  Laterality: N/A;   TRANSTHORACIC ECHOCARDIOGRAM  10/08/2012   ef 50-55%,  grade 1 diastolic dysfunction/  trivial MR and TR/  mild LAE    Family History  Problem Relation Age of Onset   Stroke Mother    Hypertension Mother    Hyperlipidemia Mother    Thyroid disease Mother    Prostate cancer Father    Colon cancer Maternal Aunt    Diabetes Neg Hx    Depression Neg Hx    Early death Neg Hx    Hearing loss Neg Hx    Heart disease Neg Hx    Kidney disease Neg Hx    Alcohol abuse Neg Hx    Breast cancer Neg Hx    Esophageal cancer Neg Hx    Stomach cancer Neg Hx    Rectal cancer Neg Hx     Social:  reports that she has never smoked. She has never used smokeless tobacco. She reports that she does not drink alcohol or use drugs.  Allergies:  Allergies  Allergen Reactions   Latex Rash   Lovastatin     Muscle aches   Protonix [Pantoprazole] Diarrhea   Adhesive [Tape] Rash   Penicillins Rash    Has patient had a PCN reaction causing immediate rash, facial/tongue/throat swelling, SOB or lightheadedness with hypotension: unknown Has patient had a PCN reaction causing severe rash involving mucus membranes or skin necrosis: No  Has patient had a PCN reaction that required hospitalization: No Has patient had a PCN reaction occurring within the last 10 years: No  If all of the above answers are "NO", then may proceed with Cephalosporin use.     Medications: I have reviewed the patient's  current medications.  Results for orders placed or performed during the hospital encounter of 01/23/19 (from the past 48 hour(s))  CBC WITH DIFFERENTIAL     Status: None   Collection Time: 01/23/19  6:42 AM  Result Value Ref Range   WBC 9.0 4.0 - 10.5 K/uL   RBC 4.32 3.87 - 5.11 MIL/uL   Hemoglobin 13.5 12.0 - 15.0 g/dL   HCT 41.7 36.0 - 46.0 %   MCV 96.5 80.0 - 100.0 fL   MCH 31.3 26.0 - 34.0 pg   MCHC 32.4 30.0 - 36.0 g/dL   RDW 14.6 11.5 - 15.5 %   Platelets 366 150 - 400 K/uL   nRBC 0.0 0.0 - 0.2 %   Neutrophils Relative % 85 %   Neutro Abs 7.6 1.7 - 7.7 K/uL   Lymphocytes Relative 7 %   Lymphs Abs 0.7 0.7 - 4.0 K/uL   Monocytes Relative 6 %   Monocytes Absolute 0.6 0.1 - 1.0 K/uL   Eosinophils Relative 1 %   Eosinophils Absolute 0.1 0.0 - 0.5 K/uL   Basophils Relative 0 %  Basophils Absolute 0.0 0.0 - 0.1 K/uL   Immature Granulocytes 1 %   Abs Immature Granulocytes 0.05 0.00 - 0.07 K/uL    Comment: Performed at [facility], [place] [address], [place], [place] [address]    No results found.  ROS - all of the below systems have been reviewed with the patient and positives are indicated with bold text General: chills, fever or night sweats Eyes: blurry vision or double vision ENT: epistaxis or sore throat Allergy/Immunology: itchy/watery eyes or nasal congestion Hematologic/Lymphatic: bleeding problems, blood clots or swollen lymph nodes Endocrine: temperature intolerance or unexpected weight changes Breast: new or changing breast lumps or nipple discharge Resp: cough, shortness of breath, or wheezing CV: chest pain or dyspnea on exertion GI: as per HPI GU: dysuria, trouble voiding, or hematuria MSK: joint pain or joint stiffness Neuro: TIA or stroke symptoms Derm: pruritus and skin lesion changes Psych: anxiety and depression  PE Blood pressure (!) 145/66, pulse 73, temperature 98.1 F (36.7 C), temperature source Oral, resp. rate 16, height 5'  4" (1.626 m), weight 67.9 kg, SpO2 100 %. Constitutional: NAD; conversant; no deformities Eyes: Moist conjunctiva; no lid lag; anicteric; PERRL Neck: Trachea midline; no thyromegaly Lungs: Normal respiratory effort; no tactile fremitus CV: RRR; no palpable thrills; no pitting edema GI: Abd soft, NT/ND; no palpable hepatosplenomegaly MSK: Normal gait; no clubbing/cyanosis Psychiatric: Appropriate affect; alert and oriented x3 Lymphatic: No palpable cervical or axillary lymphadenopathy  Results for orders placed or performed during the hospital encounter of 01/23/19 (from the past 48 hour(s))  CBC WITH DIFFERENTIAL     Status: None   Collection Time: 01/23/19  6:42 AM  Result Value Ref Range   WBC 9.0 4.0 - 10.5 K/uL   RBC 4.32 3.87 - 5.11 MIL/uL   Hemoglobin 13.5 12.0 - 15.0 g/dL   HCT 41.7 36.0 - 46.0 %   MCV 96.5 80.0 - 100.0 fL   MCH 31.3 26.0 - 34.0 pg   MCHC 32.4 30.0 - 36.0 g/dL   RDW 14.6 11.5 - 15.5 %   Platelets 366 150 - 400 K/uL   nRBC 0.0 0.0 - 0.2 %   Neutrophils Relative % 85 %   Neutro Abs 7.6 1.7 - 7.7 K/uL   Lymphocytes Relative 7 %   Lymphs Abs 0.7 0.7 - 4.0 K/uL   Monocytes Relative 6 %   Monocytes Absolute 0.6 0.1 - 1.0 K/uL   Eosinophils Relative 1 %   Eosinophils Absolute 0.1 0.0 - 0.5 K/uL   Basophils Relative 0 %   Basophils Absolute 0.0 0.0 - 0.1 K/uL   Immature Granulocytes 1 %   Abs Immature Granulocytes 0.05 0.00 - 0.07 K/uL    Comment: Performed at [facility], [place] [address], [place], [place] [address]   A/P: Ms. Hanford is a very pleasant 57yoF with hx of Afib (on eliquis), severe perianal Crohn's; Crohn's of ascending/hepatic flexure with stricture which is currently asymptomatic - bx benign 04/2018  -The anatomy and physiology of the anal canal was discussed at length with the patient. The pathophysiology of Crohn's disease and fistulas was discussed at length as well -We discussed options moving forward. To assist with the  discomfort which is likely related to ongoing inflammation and the purulent drainage she has, we discussed anorectal EUA with placement of additional setons. We discussed with further observation which is also an option, ongoing discomfort. We discussed ongoing discomfort despite seton placement as well and that the point  of this would be to decrease her risk of perianal abscess recurrence. -We discussed plans, including to see how her disease responds to Remicade with the increasing doses to a therapeutic level. Given the aggressive nature of her disease, we discussed that ultimately this may not be manageable with medical therapy. We discussed diverting colostomy with a staged procedure. We discussed that this colostomy would be permeant. We discussed additional surgery following all of this as well including removal of the rectum and anus. We discussed that the ascending colon stricture need to be reevaluated and potentially addressed as well. With the extent of her perianal disease, is also possible she would need some sort of flap to assist with closure and we discussed potential referral to tertiary care if this were necessary. -The planned procedure, material risks (including, but not limited to, pain, bleeding, infection, scarring, need for blood transfusion, damage to anal sphincter, incontinence of gas and/or stool, need for additional procedures, recurrence, pneumonia, heart attack, stroke, death) benefits and alternatives to surgery were discussed at length. I noted a good probability that the procedure would help improve their symptoms. The patient's questions were answered to their satisfaction, they voiced understanding and elected to proceed with surgery. Additionally, we discussed typical postop recovery  Sharon Mt. Dema Severin, M.D. [facility] [phone]

## 2019-01-23 NOTE — Anesthesia Postprocedure Evaluation (Signed)
Anesthesia Post Note  Patient: Julie Clark  Procedure(s) Performed: ANORECTAL EXAM UNDER ANESTHESIA,  INCISION AND DRAINAGE x2 (N/A Anus) PLACEMENT OF SETONS x4 (N/A Anus)     Patient location during evaluation: Phase II Anesthesia Type: General Level of consciousness: awake and alert, oriented and patient cooperative Pain management: pain level controlled Respiratory status: spontaneous breathing, nonlabored ventilation and respiratory function stable Cardiovascular status: blood pressure returned to baseline and stable Postop Assessment: no apparent nausea or vomiting, able to ambulate and adequate PO intake Anesthetic complications: no    Last Vitals:  Vitals:   01/23/19 0915 01/23/19 0932  BP: 137/74 134/90  Pulse: 82 83  Resp: 15 18  Temp:  36.8 C  SpO2: 95% 96%    Last Pain:  Vitals:   01/23/19 0932  TempSrc:   PainSc: 0-No pain                 Malay Fantroy,E. Steffon Gladu

## 2019-01-26 ENCOUNTER — Encounter (HOSPITAL_BASED_OUTPATIENT_CLINIC_OR_DEPARTMENT_OTHER): Payer: Self-pay | Admitting: Surgery

## 2019-01-27 LAB — AEROBIC/ANAEROBIC CULTURE W GRAM STAIN (SURGICAL/DEEP WOUND): Gram Stain: NONE SEEN

## 2019-02-03 MED FILL — AZATHIOPRINE 50 MG TABS: 50 | 30 days supply | Qty: 90 | Fill #3

## 2019-02-04 MED FILL — ELIQUIS 5 MG TABLET: 5 | 30 days supply | Qty: 60 | Fill #7

## 2019-02-06 ENCOUNTER — Telehealth: Payer: Self-pay

## 2019-02-06 ENCOUNTER — Ambulatory Visit: Payer: 59 | Admitting: Gastroenterology

## 2019-02-06 ENCOUNTER — Encounter: Payer: Self-pay | Admitting: Gastroenterology

## 2019-02-06 ENCOUNTER — Ambulatory Visit (INDEPENDENT_AMBULATORY_CARE_PROVIDER_SITE_OTHER): Payer: 59 | Admitting: Gastroenterology

## 2019-02-06 VITALS — BP 141/60 | HR 73 | Temp 97.6°F | Wt 153.0 lb

## 2019-02-06 DIAGNOSIS — K50119 Crohn's disease of large intestine with unspecified complications: Secondary | ICD-10-CM

## 2019-02-06 NOTE — Progress Notes (Signed)
Review of pertinent gastrointestinal problems: 1.Crohn's disease:Crohn's colitis withsevereperianal disease.No evidence of small bowel disease on5/2019 CT enterography. Colonoscopy 09/2017 showed normal terminal ileum, patchy deep ulcers segmentally throughout her colon, severe perianal disease with fistulas and early abscess that was treated byoral Abx,debridement, seton placement 09/2017, Dr. Johney Maine.Colonoscopy 12/2012 for average risk screening; incidental inflammation right colon (path suggested possible IBD) was attributed to NSAID use at the time.  Labs 09/2017: TB quant gold negative, Hepatitis B Surface Ab negative, Surface Ag negative, Hep C Ab negative; TPMT activity level NORMAL.  TB quant gold 10/2018 negative  Dual therapy with immunomodulators(azathiaprine)and biologic(humira)started 09/2017. Poor response of perianal disease led to drug level testing (01/2018) adalimumab was 5.6 (low) at trough, no antibodies detected. Humira increased to 69m every other week. Continued poor perianal response led to repeat drug testing 03/2018) adalimumab trough level was 6.8 (still lower than ideal) with no antibodies detected. 03/2018 Changed humira dosing to 471monce weekly, also increased azathiaprine to 15012mnce daily.2020 despite 40 mg once weekly Humira her drug levels were lower than acceptable. Changedto 5 mg/kg Remicade 2020.  Remicade drug levels 08/2018: suboptimal (1.3mc44mL at 5mg/33mremicade).  ADA undetectable.  Increased remicade to 10mg/23m 10 mg/kg Remicade drug levels still low, requested 10 mg/kg Remicade every 6 weeks however this was denied by her insurance company.  Formal appeals process started in late August 2020  Exam under anesthesia 01/23/2019 with Dr. ChristNadeen Landausion and drainage of 2 small separate abscess cavities perianal.  Also placement of 4 separate, new perianal setons.     HPI: This is a very pleasant 63 yea49old woman.  She has  severe Crohn's colitis with severe perianal disease.  She underwent exam under anesthesia about 2 weeks ago with Dr. ChristNadeen Landausion and drainage of 2 small separate abscesses, removal of her previous seton and placement of 4 new perianal setons.  She is here for follow-up.  Chief complaint is Crohn's colitis, severe perianal disease  She says her backside is feeling less tender and sore in general since the I&D, exam under anesthesia, seton placement.  She brought with her some paperwork that she recently received from her insurance company regarding the ongoing appeals process  ROS: complete GI ROS as described in HPI, all other review negative.  Constitutional:  No unintentional weight loss   Past Medical History:  Diagnosis Date  . Anticoagulant long-term use    eliquis, managed by cardiology  . [facility] of [facility]ianal region [facility]  . Heart murmur   . Hemorrhoids    internal and external  . History of hidradenitis suppurativa    axilla  . Hyperlipidemia   . Hypertension   . [facility]ysmal [facility]  . Rectal pain   . Uterine fibroid    per MRI 09-16-2017  . Wears glasses   . Wears glasses     Past Surgical History:  Procedure Laterality Date  . CESAREAN SECTION  x2  last one 1996  . COLONOSCOPY WITH PROPOFOL  last one 05-14-2018  dr d. jacobsArdis Hughsauer [facility]  . INCISION AND DRAINAGE ABSCESS N/A 10/11/2017   Procedure: DRAINAGE OF PERIRECTAL ABSCESSES;  Surgeon: Gross,Michael Boston [place];  Laterality: N/A;  . PLACEMENT OF SETON N/A 10/11/2017   Procedure: PLACEMENT OF SETON;  Surgeon: Gross,Johney Maine  Remo Lipps, MD;  Location: [facility];  Service: General;  Laterality: N/A;  . PLACEMENT OF SETON N/A 01/23/2019   Procedure: PLACEMENT OF SETONS x4;  Surgeon: Ileana Roup, MD;  Location:  [place];  Service: General;  Laterality: N/A;  . RECTAL EXAM UNDER ANESTHESIA N/A 10/11/2017   Procedure: ANORECTAL EXAM UNDER ANESTHESIA;  Surgeon: Michael Boston, MD;  Location: [place];  Service: General;  Laterality: N/A;  . RECTAL EXAM UNDER ANESTHESIA N/A 01/23/2019   Procedure: ANORECTAL EXAM UNDER ANESTHESIA,  INCISION AND DRAINAGE x2;  Surgeon: Ileana Roup, MD;  Location: [place];  Service: General;  Laterality: N/A;  . TRANSTHORACIC ECHOCARDIOGRAM  10/08/2012   ef 50-55%,  grade 1 diastolic dysfunction/  trivial MR and TR/  mild LAE    Current Outpatient Medications  Medication Sig Dispense Refill  . acetaminophen (TYLENOL) 500 MG tablet Take 500 mg by mouth every 6 (six) hours as needed.    Marland Kitchen apixaban (ELIQUIS) 5 MG TABS tablet Take 1 tablet (5 mg total) by mouth 2 (two) times daily. 60 tablet 11  . azaTHIOprine (IMURAN) 50 MG tablet TAKE 3 TABLETS BY MOUTH ONCE A DAY (Patient taking differently: Take 150 mg by mouth daily with lunch. ) 90 tablet 3  . Cholecalciferol (VITAMIN D3) 1.25 MG (50000 UT) CAPS TAKE 1 CAPSULE (50,000 UNITS TOTAL) BY MOUTH ONCE A WEEK. (Patient taking differently: once a week. ) 12 capsule 0  . ciprofloxacin (CIPRO) 500 MG tablet Take 1 tablet (500 mg total) by mouth 2 (two) times daily. (Patient taking differently: Take 500 mg by mouth daily with lunch. ) 60 tablet 0  . diltiazem (CARTIA XT) 120 MG 24 hr capsule Take 1 capsule (120 mg total) by mouth daily. Please keep upcoming appt in February for future refills. Thank you 30 capsule 11  . ferrous sulfate 325 (65 FE) MG tablet Take 1 tablet (325 mg total) by mouth 2 (two) times daily with a meal. (Patient taking differently: Take 325 mg by mouth daily. ) 180 tablet 1  . flecainide (TAMBOCOR) 150 MG tablet Take 2 tablets (300 mg total) by mouth daily as needed (for Atrial Fibrillation). 10 tablet 1  . HYDROmorphone (DILAUDID) 2 MG tablet Take 1-2  tablets (2-4 mg total) by mouth every 4 (four) hours as needed for severe pain. 60 tablet 0  . inFLIXimab (REMICADE IV) Inject into the vein.    . metoprolol succinate (TOPROL-XL) 25 MG 24 hr tablet TAKE 1 TABLET (25 MG TOTAL) BY MOUTH 2 TIMES DAILY. (Patient taking differently: Take 25 mg by mouth 2 (two) times daily. ) 180 tablet 1  . metroNIDAZOLE (FLAGYL) 500 MG tablet Take 1 tablet (500 mg total) by mouth 2 (two) times daily. (Patient taking differently: Take 500 mg by mouth daily with lunch. ) 60 tablet 0  . Polyethylene Glycol 3350 (MIRALAX PO) Take by mouth daily.     No current facility-administered medications for this visit.    Facility-Administered Medications Ordered in Other Visits  Medication Dose Route Frequency Provider Last Rate Last Dose  . Chlorhexidine Gluconate Cloth 2 % PADS 6 each  6 each Topical Once Michael Boston, MD       And  . Chlorhexidine Gluconate Cloth 2 % PADS 6 each  6 each Topical Once Michael Boston, MD        Allergies as of 02/06/2019 - Review Complete 02/06/2019  Allergen Reaction Noted  . Latex Rash  10/10/2017  . Lovastatin  08/11/2012  . Protonix [pantoprazole] Diarrhea 10/10/2017  . Adhesive [tape] Rash 10/10/2017  . Penicillins Rash 08/11/2012    Family History  Problem Relation Age of Onset  . Stroke Mother   . Hypertension Mother   . Hyperlipidemia Mother   . Thyroid disease Mother   . Prostate cancer Father   . Colon cancer Maternal Aunt   . Diabetes Neg Hx   . Depression Neg Hx   . Early death Neg Hx   . Hearing loss Neg Hx   . Heart disease Neg Hx   . Kidney disease Neg Hx   . Alcohol abuse Neg Hx   . Breast cancer Neg Hx   . Esophageal cancer Neg Hx   . Stomach cancer Neg Hx   . Rectal cancer Neg Hx     Social History   Socioeconomic History  . Marital status: Married    Spouse name: Not on file  . Number of children: Not on file  . Years of education: Not on file  . Highest education level: Not on file   Occupational History  . Occupation: registered Optician, dispensing: [employer]  . Financial resource strain: Not on file  . Food insecurity    Worry: Not on file    Inability: Not on file  . Transportation needs    Medical: Not on file    Non-medical: Not on file  Tobacco Use  . Smoking status: Never Smoker  . Smokeless tobacco: Never Used  Substance and Sexual Activity  . Alcohol use: No  . Drug use: No  . Sexual activity: Not on file  Lifestyle  . Physical activity    Days per week: Not on file    Minutes per session: Not on file  . Stress: Not on file  Relationships  . Social Herbalist on phone: Not on file    Gets together: Not on file    Attends religious service: Not on file    Active member of club or organization: Not on file    Attends meetings of clubs or organizations: Not on file    Relationship status: Not on file  . Intimate partner violence    Fear of current or ex partner: Not on file    Emotionally abused: Not on file    Physically abused: Not on file    Forced sexual activity: Not on file  Other Topics Concern  . Not on file  Social History Narrative  . Not on file     Physical Exam: BP (!) 141/60   Pulse 73   Temp 97.6 F (36.4 C)   Wt 153 lb (69.4 kg)   BMI 26.26 kg/m  Constitutional: generally well-appearing Psychiatric: alert and oriented x3 Abdomen: soft, nontender, nondistended, no obvious ascites, no peritoneal signs, normal bowel sounds No peripheral edema noted in lower extremities Rectal exam with female assistant in the room.  Overall he in perianal induration is much improved but certainly still not normal yet.  She has 4 new setons placed.  There is some minor drainage next to 1 of the setons in her right buttocks.  Site of superficial perianal abscess was clean dry and intact with nice granulation tissue growing.  Assessment and plan: 63 y.o. female with severe Crohn's with colitis and severe perianal  disease  Hopefully the recent exam under anesthesia with seton placement and I&D will help her perianal disease heal.  I am not certain that we will be able to avoid bowel diversion.  We did not discuss that again today as she has been very clear that she does not want to have that done.  Her insurance company has denied the request for her to have 10 mg/kg Remicade every 6 weeks instead of 8.  I filed an Furniture conservator/restorer and sent of personal letter as part of the appeal process.  Jerita brought with her some paperwork that was recently sent from her insurance company regarding the appeals.  She was not sure what she was supposed to do with it.  I reviewed it, it looks honestly like it is simple busy work for her to do, however if she does not do this and fill out this rather simple form and they do not receive his paperwork back by September 27 they will close the appeal.  We called the phone number on the paperwork for her and this is something that clearly she has to fill out herself.  I have given the paperwork back to her instructed her to call the number on the form and remind her that this needs to be received by her insurance company or else her appeals process will be closed.  She will see me back in the office in 6 or 8 weeks.  She is seeing Dr. Dema Severin next week.  Please see the "Patient Instructions" section for addition details about the plan.  Owens Loffler, MD [facility] 02/06/2019, 11:20 AM

## 2019-02-06 NOTE — Telephone Encounter (Signed)
Patient was seen in office today and brought with her an enclosed form titled "Designation of Authorized Representative" For the appeal request on her IV Remicade. Patient and Dr Ardis Hughs signed the form. I spoke with the appeals coordinator with [facility] who stated once the form is faxed and received they will initiate appeal processing. This may take up to a week to determine benefit t eligibility. Form has been faxed to provided number.

## 2019-02-06 NOTE — Patient Instructions (Signed)
We are continuing to work on your medication appeal determination of benifets  Thank you for entrusting me with your care and choosing [facility].  Dr Ardis Hughs

## 2019-02-18 ENCOUNTER — Other Ambulatory Visit: Payer: Self-pay | Admitting: Internal Medicine

## 2019-02-18 DIAGNOSIS — E559 Vitamin D deficiency, unspecified: Secondary | ICD-10-CM

## 2019-02-23 ENCOUNTER — Telehealth: Payer: Self-pay | Admitting: Gastroenterology

## 2019-02-23 ENCOUNTER — Other Ambulatory Visit: Payer: Self-pay

## 2019-02-23 ENCOUNTER — Telehealth: Payer: Self-pay

## 2019-02-23 DIAGNOSIS — K50119 Crohn's disease of large intestine with unspecified complications: Secondary | ICD-10-CM

## 2019-02-23 NOTE — Telephone Encounter (Signed)
I spoke to a representative at [facility] regarding patients Remicade 10 mg/kg every 6 weeks benefits. It was determined that she has coverage from 01/2019-04/2020 for every 6 weeks Remicade 10 mg. Authorization #[id number] # [id number]. Patient has been notified.  Approval letter has been sent to her pharmacy and they will be faxing over letter to our office.

## 2019-02-23 NOTE — Telephone Encounter (Signed)
Julie Clark showed me a letter she just received from her insurance company. The denial was overturned after formal appeal and so she has been authorized for remicade 78m/kg EVERY 6 weeks now.  Can you please help get that infusion scheduled for next week (Monday will be 6 weeks from her last infusion)?  Thanks

## 2019-02-23 NOTE — Telephone Encounter (Signed)
The pt has an appt for 10/5 for Remicade new orders have been entered for 10 mg/kg every 6 weeks. The pt is aware

## 2019-03-02 ENCOUNTER — Ambulatory Visit (HOSPITAL_COMMUNITY)
Admission: RE | Admit: 2019-03-02 | Discharge: 2019-03-02 | Disposition: A | Discharge status: Home / Self Care | Payer: 59 | Source: Ambulatory Visit | Attending: Gastroenterology | Admitting: Gastroenterology

## 2019-03-02 ENCOUNTER — Other Ambulatory Visit: Payer: Self-pay

## 2019-03-02 DIAGNOSIS — K50119 Crohn's disease of large intestine with unspecified complications: Secondary | ICD-10-CM | POA: Insufficient documentation

## 2019-03-02 MED ORDER — ACETAMINOPHEN 325 MG PO TABS: 650.0000 mg | ORAL_TABLET | Freq: Once | ORAL | Status: DC

## 2019-03-02 MED ORDER — SODIUM CHLORIDE 0.9 % IV SOLN
10.0000 mg/kg | INTRAVENOUS | Status: DC
  Administered 2019-03-02: 700 mg via INTRAVENOUS
  Filled 2019-03-02: qty 70

## 2019-03-02 MED ORDER — DIPHENHYDRAMINE HCL 25 MG PO CAPS: 25.0000 mg | ORAL_CAPSULE | Freq: Once | ORAL | Status: DC

## 2019-03-02 MED ORDER — SODIUM CHLORIDE 0.9 % IV SOLN
INTRAVENOUS | Status: DC | PRN
  Administered 2019-03-02: 09:00:00 250 mL via INTRAVENOUS

## 2019-03-02 NOTE — Discharge Instructions (Signed)
Infliximab injection What is this medicine? INFLIXIMAB (in [place] mab) is used to treat Crohn's disease and ulcerative colitis. It is also used to treat ankylosing spondylitis, plaque psoriasis, and some forms of arthritis. This medicine may be used for other purposes; ask your health care provider or pharmacist if you have questions. COMMON BRAND NAME(S): INFLECTRA, Remicade, RENFLEXIS What should I tell my health care provider before I take this medicine? They need to know if you have any of these conditions:  cancer  current or past resident of [place] or [place]  diabetes  exposure to tuberculosis  Guillain-Barre syndrome  heart failure  hepatitis or liver disease  immune system problems  infection  lung or breathing disease, like COPD  multiple sclerosis  receiving phototherapy for the skin  seizure disorder  an unusual or allergic reaction to infliximab, mouse proteins, other medicines, foods, dyes, or preservatives  pregnant or trying to get pregnant  breast-feeding How should I use this medicine? This medicine is for injection into a vein. It is usually given by a health care professional in a hospital or clinic setting. A special MedGuide will be given to you by the pharmacist with each prescription and refill. Be sure to read this information carefully each time. Talk to your pediatrician regarding the use of this medicine in children. While this drug may be prescribed for children as young as 66 years of age for selected conditions, precautions do apply. Overdosage: If you think you have taken too much of this medicine contact a poison control center or emergency room at once. NOTE: This medicine is only for you. Do not share this medicine with others. What if I miss a dose? It is important not to miss your dose. Call your doctor or health care professional if you are unable to keep an appointment. What may interact with this medicine? Do not  take this medicine with any of the following medications:  biologic medicines such as abatacept, adalimumab, anakinra, certolizumab, etanercept, golimumab, rituximab, secukinumab, tocilizumab, tofactinib, ustekinumab  live vaccines This list may not describe all possible interactions. Give your health care provider a list of all the medicines, herbs, non-prescription drugs, or dietary supplements you use. Also tell them if you smoke, drink alcohol, or use illegal drugs. Some items may interact with your medicine. What should I watch for while using this medicine? Your condition will be monitored carefully while you are receiving this medicine. Visit your doctor or health care professional for regular checks on your progress. You may need blood work done while you are taking this medicine. Before beginning therapy, your doctor may do a test to see if you have been exposed to tuberculosis. Call your doctor or health care professional for advice if you get a fever, chills or sore throat, or other symptoms of a cold or flu. Do not treat yourself. This drug decreases your body's ability to fight infections. Try to avoid being around people who are sick. This medicine may make the symptoms of heart failure worse in some patients. If you notice symptoms such as increased shortness of breath or swelling of the ankles or legs, contact your health care provider right away. If you are going to have surgery or dental work, tell your health care professional or dentist that you have received this medicine. If you take this medicine for plaque psoriasis, stay out of the sun. If you cannot avoid being in the sun, wear protective clothing and use sunscreen. Do not  use sun lamps or tanning beds/booths. Talk to your doctor about your risk of cancer. You may be more at risk for certain types of cancers if you take this medicine. What side effects may I notice from receiving this medicine? Side effects that you should  report to your doctor or health care professional as soon as possible:  allergic reactions like skin rash, itching or hives, swelling of the face, lips, or tongue  breathing problems  changes in vision  chest pain  fever or chills, usually related to the infusion  joint pain  pain, tingling, numbness in the hands or feet  redness, blistering, peeling or loosening of the skin, including inside the mouth  seizures  signs of infection - fever or chills, cough, sore throat, flu-like symptoms, pain or difficulty passing urine  signs and symptoms of liver injury like dark yellow or brown urine; general ill feeling; light-colored stools; loss of appetite; nausea; right upper belly pain; unusually weak or tired; yellowing of the eyes or skin  signs and symptoms of a stroke like changes in vision; confusion; trouble speaking or understanding; severe headaches; sudden numbness or weakness of the face, arm or leg; trouble walking; dizziness; loss of balance or coordination  swelling of the ankles, feet, or hands  swollen lymph nodes in the neck, underarm, or groin areas  unusual bleeding or bruising  unusually weak or tired Side effects that usually do not require medical attention (report to your doctor or health care professional if they continue or are bothersome):  headache  nausea  stomach pain  upset stomach This list may not describe all possible side effects. Call your doctor for medical advice about side effects. You may report side effects to FDA at 1-800-FDA-1088. Where should I keep my medicine? This drug is given in a hospital or clinic and will not be stored at home. NOTE: This sheet is a summary. It may not cover all possible information. If you have questions about this medicine, talk to your doctor, pharmacist, or health care provider.  2020 Elsevier/Gold Standard (2016-06-13 13:45:32)

## 2019-03-02 NOTE — Progress Notes (Signed)
Patient Care Center Note   Diagnosis:Crohn's Disease   Provider:Jacobs, Melene Plan. MD   Procedure:IV Remicade   Note:Patient received IV Remicade via PIV and tolerated well. Vitals were monitor during course of treatment and were stable. Discharge instructions given. Patient alert, oriented and ambulatory at time of discharge.

## 2019-03-04 ENCOUNTER — Encounter: Payer: Self-pay | Admitting: Gastroenterology

## 2019-03-04 ENCOUNTER — Other Ambulatory Visit: Payer: Self-pay

## 2019-03-04 ENCOUNTER — Ambulatory Visit (INDEPENDENT_AMBULATORY_CARE_PROVIDER_SITE_OTHER): Payer: 59 | Admitting: Gastroenterology

## 2019-03-04 ENCOUNTER — Other Ambulatory Visit: Payer: Self-pay | Admitting: Gastroenterology

## 2019-03-04 VITALS — BP 142/76 | HR 94 | Temp 98.4°F | Ht 64.0 in | Wt 160.0 lb

## 2019-03-04 DIAGNOSIS — K50119 Crohn's disease of large intestine with unspecified complications: Secondary | ICD-10-CM

## 2019-03-04 DIAGNOSIS — B372 Candidiasis of skin and nail: Secondary | ICD-10-CM | POA: Diagnosis not present

## 2019-03-04 MED ORDER — TERBINAFINE HCL 1 % EX CREA: TOPICAL_CREAM | CUTANEOUS | 0 refills | Status: DC

## 2019-03-04 MED FILL — AZATHIOPRINE 50 MG TABS: 50 | 30 days supply | Qty: 90 | Fill #0

## 2019-03-04 NOTE — Progress Notes (Signed)
Review of pertinent gastrointestinal problems: 1.Crohn's disease:Crohn's colitis withsevereperianal disease.No evidence of small bowel disease on5/2019 CT enterography. Colonoscopy 09/2017 showed normal terminal ileum, patchy deep ulcers segmentally throughout her colon, severe perianal disease with fistulas and early abscess that was treated byoral Abx,debridement, seton placement 09/2017, Dr. Johney Maine.Colonoscopy 12/2012 for average risk screening; incidental inflammation right colon (path suggested possible IBD) was attributed to NSAID use at the time.  Labs 09/2017: TB quant gold negative, Hepatitis B Surface Ab negative, Surface Ag negative, Hep C Ab negative; TPMT activity level NORMAL.  TB quant gold 10/2018 negative  Dual therapy with immunomodulators(azathiaprine)and biologic(humira)started 09/2017. Poor response of perianal disease led to drug level testing (01/2018) adalimumab was 5.6 (low) at trough, no antibodies detected. Humira increased to 38m every other week. Continued poor perianal response led to repeat drug testing 03/2018) adalimumab trough level was 6.8 (still lower than ideal) with no antibodies detected. 03/2018 Changed humira dosing to 421monce weekly, also increased azathiaprine to 15077mnce daily.2020 despite 40 mg once weekly Humira her drug levels were lower than acceptable. Changedto 5 mg/kg Remicade 2020.  Remicade drug levels 08/2018: suboptimal (1.3mc48mL at 5mg/25mremicade). ADA undetectable. Increased remicade to 10mg/85m 10 mg/kg Remicade drug levels still low, requested 10 mg/kg Remicade every 6 weeks however this was denied by her insurance company.  Formal appeals process started in late August 2020  Exam under anesthesia 01/23/2019 with Dr. ChristNadeen Landausion and drainage of 2 small separate abscess cavities perianal.  Also placement of 4 separate, new perianal setons.  HPI: This is a very pleasant 63 yea2old woman.  Julie Clark got her  first 6-week Remicade infusion 2 days ago.  10 mg/kg.  Julie Clark came down from the fourth floor today saying Julie Clark is having drainage from her bellybutton so I worked her in for a quick appointment.  About 12 to 18 hours of drainage from her bellybutton, crusty.  No bleeding.  No changes in her bowels, no abdominal pains.  Her anal disease is "slowly improving".  Chief complaint is, Crohn's disease, umbilical drainage  ROS: complete GI ROS as described in HPI, all other review negative.  Constitutional:  No unintentional weight loss   Past Medical History:  Diagnosis Date  . Anticoagulant long-term use    eliquis, managed by cardiology  . Crohn's disease of perianal region ([facility]ac[facility]dis [facility])  . Heart murmur   . Hemorrhoids    internal and external  . History of hidradenitis suppurativa    axilla  . Hyperlipidemia   . Hypertension   . PAF (paroxysmal atrial fibrillation) ([facility]  . Rectal pain   . Uterine fibroid    per MRI 09-16-2017  . Wears glasses   . Wears glasses     Past Surgical History:  Procedure Laterality Date  . CESAREAN SECTION  x2  last one 1996  . COLONOSCOPY WITH PROPOFOL  last one 05-14-2018  dr d. jacobsArdis Hughsauer GI  . INCISION AND DRAINAGE ABSCESS N/A 10/11/2017   Procedure: DRAINAGE OF PERIRECTAL ABSCESSES;  Surgeon: Gross,Michael Boston [facility];  Laterality: N/A;  . [place]   Procedure: PLACEMENT OF SETON;  Surgeon: Gross,Michael Boston [facility];  Laterality: N/A;  . PLACEMENT OF SETON N/A 01/23/2019   Procedure: PLACEMENT OF  SETONS x4;  Surgeon: Ileana Roup, MD;  [facility];  Service: General;  Laterality: N/A;  . RECTAL EXAM UNDER ANESTHESIA N/A 10/11/2017   Procedure: ANORECTAL EXAM UNDER ANESTHESIA;  Surgeon: Michael Boston,  MD;  Location: [place];  Service: General;  Laterality: N/A;  . RECTAL EXAM UNDER ANESTHESIA N/A 01/23/2019   Procedure: ANORECTAL EXAM UNDER ANESTHESIA,  INCISION AND DRAINAGE x2;  Surgeon: Ileana Roup, MD;  Location: [place];  Service: General;  Laterality: N/A;  . TRANSTHORACIC ECHOCARDIOGRAM  10/08/2012   ef 50-55%,  grade 1 diastolic dysfunction/  trivial MR and TR/  mild LAE    Current Outpatient Medications  Medication Sig Dispense Refill  . acetaminophen (TYLENOL) 500 MG tablet Take 500 mg by mouth every 6 (six) hours as needed.    Marland Kitchen apixaban (ELIQUIS) 5 MG TABS tablet Take 1 tablet (5 mg total) by mouth 2 (two) times daily. 60 tablet 11  . azaTHIOprine (IMURAN) 50 MG tablet TAKE 3 TABLETS BY MOUTH ONCE A DAY (Patient taking differently: Take 150 mg by mouth daily with lunch. ) 90 tablet 3  . Cholecalciferol (VITAMIN D3) 1.25 MG (50000 UT) CAPS TAKE 1 CAPSULE (50,000 UNITS TOTAL) BY MOUTH ONCE A WEEK. (Patient taking differently: once a week. ) 12 capsule 0  . ciprofloxacin (CIPRO) 500 MG tablet Take 1 tablet (500 mg total) by mouth 2 (two) times daily. (Patient taking differently: Take 500 mg by mouth daily with lunch. ) 60 tablet 0  . diltiazem (CARTIA XT) 120 MG 24 hr capsule Take 1 capsule (120 mg total) by mouth daily. Please keep upcoming appt in February for future refills. Thank you 30 capsule 11  . ferrous sulfate 325 (65 FE) MG tablet Take 1 tablet (325 mg total) by mouth 2 (two) times daily with a meal. (Patient taking differently: Take 325 mg by mouth daily. ) 180 tablet 1  . flecainide (TAMBOCOR) 150 MG tablet Take 2 tablets (300 mg total) by mouth daily as needed (for Atrial Fibrillation). 10 tablet 1  . HYDROmorphone (DILAUDID) 2 MG tablet Take 1-2 tablets (2-4 mg total) by mouth every 4 (four) hours as needed for severe pain. 60 tablet 0  . inFLIXimab (REMICADE IV) Inject into the vein.    . metoprolol succinate (TOPROL-XL) 25  MG 24 hr tablet TAKE 1 TABLET (25 MG TOTAL) BY MOUTH 2 TIMES DAILY. (Patient taking differently: Take 25 mg by mouth 2 (two) times daily. ) 180 tablet 1  . metroNIDAZOLE (FLAGYL) 500 MG tablet Take 1 tablet (500 mg total) by mouth 2 (two) times daily. (Patient taking differently: Take 500 mg by mouth daily with lunch. ) 60 tablet 0  . Polyethylene Glycol 3350 (MIRALAX PO) Take by mouth daily.     No current facility-administered medications for this visit.    Facility-Administered Medications Ordered in Other Visits  Medication Dose Route Frequency Provider Last Rate Last Dose  . Chlorhexidine Gluconate Cloth 2 % PADS 6 each  6 each Topical Once Michael Boston, MD       And  . Chlorhexidine Gluconate Cloth 2 % PADS 6 each  6 each Topical Once Michael Boston, MD        Allergies as of 03/04/2019 - Review Complete 02/06/2019  Allergen Reaction Noted  . Latex Rash 10/10/2017  . Lovastatin  08/11/2012  . Protonix [pantoprazole] Diarrhea 10/10/2017  . Adhesive [tape] Rash 10/10/2017  . Penicillins Rash 08/11/2012  Family History  Problem Relation Age of Onset  . Stroke Mother   . Hypertension Mother   . Hyperlipidemia Mother   . Thyroid disease Mother   . Prostate cancer Father   . Colon cancer Maternal Aunt   . Diabetes Neg Hx   . Depression Neg Hx   . Early death Neg Hx   . Hearing loss Neg Hx   . Heart disease Neg Hx   . Kidney disease Neg Hx   . Alcohol abuse Neg Hx   . Breast cancer Neg Hx   . Esophageal cancer Neg Hx   . Stomach cancer Neg Hx   . Rectal cancer Neg Hx     Social History   Socioeconomic History  . Marital status: Married    Spouse name: Not on file  . Number of children: Not on file  . Years of education: Not on file  . Highest education level: Not on file  Occupational History  . Occupation: registered Optician, dispensing: [employer]  . Financial resource strain: Not on file  . Food insecurity    Worry: Not on file    Inability:  Not on file  . Transportation needs    Medical: Not on file    Non-medical: Not on file  Tobacco Use  . Smoking status: Never Smoker  . Smokeless tobacco: Never Used  Substance and Sexual Activity  . Alcohol use: No  . Drug use: No  . Sexual activity: Not on file  Lifestyle  . Physical activity    Days per week: Not on file    Minutes per session: Not on file  . Stress: Not on file  Relationships  . Social Herbalist on phone: Not on file    Gets together: Not on file    Attends religious service: Not on file    Active member of club or organization: Not on file    Attends meetings of clubs or organizations: Not on file    Relationship status: Not on file  . Intimate partner violence    Fear of current or ex partner: Not on file    Emotionally abused: Not on file    Physically abused: Not on file    Forced sexual activity: Not on file  Other Topics Concern  . Not on file  Social History Narrative  . Not on file     Physical Exam: BP (!) 142/76   Pulse 94   Temp 98.4 F (36.9 C)   Ht 5' 4"  (1.626 m)   Wt 160 lb (72.6 kg) Comment: verbalized  BMI 27.46 kg/m  Constitutional: generally well-appearing Psychiatric: alert and oriented x3 Abdomen: soft, nontender, nondistended, no obvious ascites, no peritoneal signs, normal bowel sounds: Skin in umbilicus was slightly irritated appearing, a bit moist looking, nontender, I could not express any purulence, there was a whitish shiny rim around the bellybutton as well. No peripheral edema noted in lower extremities  Assessment and plan: 63 y.o. female with Crohn's disease, possible umbilical yeast infection  I think this is a yeast infection in her umbilicus.  Julie Clark does not have any similar issues anywhere else per.  Certainly Julie Clark is immunocompromised enough to get a yeast infection like this.  I doubt fistula communicating anywhere with her intestines.  This does not appear to be a bacterial cellulitis.  I advised  her to put Lamisil ointment at the site twice daily for the next 3  days and let me know if things do not improve.  Please see the "Patient Instructions" section for addition details about the plan.  Owens Loffler, MD [facility] 03/04/2019, 9:13 AM

## 2019-03-04 NOTE — Patient Instructions (Signed)
We have sent the following medications to your pharmacy for you to pick up at your convenience: Lamisil cream   Thank you for entrusting me with your care and choosing [facility].  Dr Ardis Hughs

## 2019-03-06 MED FILL — ELIQUIS 5 MG TABLET: 5 | 30 days supply | Qty: 60 | Fill #8

## 2019-03-08 MED FILL — CARTIA XT 120 MG CP24: 120 | 90 days supply | Qty: 90 | Fill #2

## 2019-03-09 ENCOUNTER — Other Ambulatory Visit: Payer: Self-pay | Admitting: Internal Medicine

## 2019-03-09 DIAGNOSIS — I10 Essential (primary) hypertension: Secondary | ICD-10-CM

## 2019-03-09 DIAGNOSIS — I480 Paroxysmal atrial fibrillation: Secondary | ICD-10-CM

## 2019-03-10 ENCOUNTER — Other Ambulatory Visit: Payer: Self-pay | Admitting: Internal Medicine

## 2019-03-10 DIAGNOSIS — I10 Essential (primary) hypertension: Secondary | ICD-10-CM

## 2019-03-10 DIAGNOSIS — I480 Paroxysmal atrial fibrillation: Secondary | ICD-10-CM

## 2019-03-10 NOTE — Telephone Encounter (Signed)
Can you contact pt, she is over due for a visit.

## 2019-03-11 ENCOUNTER — Telehealth: Payer: Self-pay | Admitting: Emergency Medicine

## 2019-03-11 ENCOUNTER — Other Ambulatory Visit: Payer: Self-pay | Admitting: Internal Medicine

## 2019-03-11 DIAGNOSIS — I10 Essential (primary) hypertension: Secondary | ICD-10-CM

## 2019-03-11 DIAGNOSIS — I480 Paroxysmal atrial fibrillation: Secondary | ICD-10-CM

## 2019-03-11 MED ORDER — METOPROLOL SUCCINATE ER 25 MG PO TB24: ORAL_TABLET | ORAL | 0 refills | Status: DC

## 2019-03-11 MED FILL — METOPROLOL SUCCINATE ER 25: 25 | 90 days supply | Qty: 180 | Fill #0

## 2019-03-11 NOTE — Telephone Encounter (Signed)
Patient will call back to make appt.

## 2019-03-11 NOTE — Telephone Encounter (Signed)
Patient will call back

## 2019-03-11 NOTE — Telephone Encounter (Signed)
Pt made appt to be seen on 03/25/2019 for a med refill. Pt is wondering if she can get a 90 day supply of her medication until she is able to be seen. Please advise thanks.

## 2019-03-11 NOTE — Telephone Encounter (Signed)
RX sent  TJ 

## 2019-03-11 NOTE — Telephone Encounter (Signed)
Pt informed rx has been sent.  

## 2019-03-16 ENCOUNTER — Encounter (HOSPITAL_COMMUNITY): Payer: 59

## 2019-03-25 ENCOUNTER — Encounter: Payer: Self-pay | Admitting: Internal Medicine

## 2019-03-25 ENCOUNTER — Ambulatory Visit: Payer: 59 | Admitting: Gastroenterology

## 2019-03-25 ENCOUNTER — Other Ambulatory Visit (INDEPENDENT_AMBULATORY_CARE_PROVIDER_SITE_OTHER): Payer: 59

## 2019-03-25 ENCOUNTER — Other Ambulatory Visit: Payer: Self-pay

## 2019-03-25 ENCOUNTER — Ambulatory Visit (INDEPENDENT_AMBULATORY_CARE_PROVIDER_SITE_OTHER): Payer: 59 | Admitting: Internal Medicine

## 2019-03-25 VITALS — BP 138/84 | HR 72 | Temp 98.5°F | Resp 16 | Ht 64.0 in | Wt 160.0 lb

## 2019-03-25 DIAGNOSIS — D510 Vitamin B12 deficiency anemia due to intrinsic factor deficiency: Secondary | ICD-10-CM | POA: Diagnosis not present

## 2019-03-25 DIAGNOSIS — Z0000 Encounter for general adult medical examination without abnormal findings: Secondary | ICD-10-CM

## 2019-03-25 DIAGNOSIS — D500 Iron deficiency anemia secondary to blood loss (chronic): Secondary | ICD-10-CM | POA: Diagnosis not present

## 2019-03-25 DIAGNOSIS — I10 Essential (primary) hypertension: Secondary | ICD-10-CM | POA: Diagnosis not present

## 2019-03-25 DIAGNOSIS — N3281 Overactive bladder: Secondary | ICD-10-CM

## 2019-03-25 DIAGNOSIS — E785 Hyperlipidemia, unspecified: Secondary | ICD-10-CM

## 2019-03-25 DIAGNOSIS — E519 Thiamine deficiency, unspecified: Secondary | ICD-10-CM | POA: Diagnosis not present

## 2019-03-25 DIAGNOSIS — E559 Vitamin D deficiency, unspecified: Secondary | ICD-10-CM

## 2019-03-25 LAB — CBC WITH DIFFERENTIAL/PLATELET
Basophils Absolute: 0.1 10*3/uL (ref 0.0–0.1)
Basophils Relative: 0.8 % (ref 0.0–3.0)
Eosinophils Absolute: 0.2 10*3/uL (ref 0.0–0.7)
Eosinophils Relative: 2.3 % (ref 0.0–5.0)
HCT: 36.7 % (ref 36.0–46.0)
Hemoglobin: 12.1 g/dL (ref 12.0–15.0)
Lymphocytes Relative: 20.4 % (ref 12.0–46.0)
Lymphs Abs: 1.3 10*3/uL (ref 0.7–4.0)
MCHC: 32.9 g/dL (ref 30.0–36.0)
MCV: 94.9 fl (ref 78.0–100.0)
Monocytes Absolute: 0.4 10*3/uL (ref 0.1–1.0)
Monocytes Relative: 6 % (ref 3.0–12.0)
Neutro Abs: 4.6 10*3/uL (ref 1.4–7.7)
Neutrophils Relative %: 70.5 % (ref 43.0–77.0)
Platelets: 351 10*3/uL (ref 150.0–400.0)
RBC: 3.87 Mil/uL (ref 3.87–5.11)
RDW: 15.2 % (ref 11.5–15.5)
WBC: 6.5 10*3/uL (ref 4.0–10.5)

## 2019-03-25 LAB — LIPID PANEL
Cholesterol: 148 mg/dL (ref 0–200)
HDL: 52.9 mg/dL (ref >39.00)
LDL Cholesterol: 79 mg/dL (ref 0–99)
NonHDL: 95.24
Total CHOL/HDL Ratio: 3
Triglycerides: 82 mg/dL (ref 0.0–149.0)
VLDL: 16.4 mg/dL (ref 0.0–40.0)

## 2019-03-25 LAB — IBC PANEL
Iron: 86 ug/dL (ref 42–145)
Saturation Ratios: 27.5 % (ref 20.0–50.0)
Transferrin: 223 mg/dL (ref 212.0–360.0)

## 2019-03-25 MED ORDER — TOLTERODINE TARTRATE ER 2 MG PO CP24: 2.0000 mg | ORAL_CAPSULE | Freq: Every day | ORAL | 1 refills | Status: DC

## 2019-03-25 MED FILL — TOLTERODINE TART ER 2 MG CA: 2 | 90 days supply | Qty: 90 | Fill #0

## 2019-03-25 NOTE — Progress Notes (Signed)
Subjective:  Patient ID: Julie Clark, female    DOB: 06-May-1956  Age: 63 y.o. MRN: [id number]  CC: Annual Exam and Hypertension   HPI Julie Clark presents for a CPX.  She complains of a several month history of urinary incontinence, urgency, and frequency.  She denies dysuria, hematuria, or abdominal pain.  She has a history of B12 deficiency and is not currently receiving a B12 supplement.  Outpatient Medications Prior to Visit  Medication Sig Dispense Refill  . acetaminophen (TYLENOL) 500 MG tablet Take 500 mg by mouth every 6 (six) hours as needed.    Marland Kitchen apixaban (ELIQUIS) 5 MG TABS tablet Take 1 tablet (5 mg total) by mouth 2 (two) times daily. 60 tablet 11  . azaTHIOprine (IMURAN) 50 MG tablet Take 3 tablets (150 mg total) by mouth daily with lunch. 90 tablet 3  . diltiazem (CARTIA XT) 120 MG 24 hr capsule Take 1 capsule (120 mg total) by mouth daily. Please keep upcoming appt in February for future refills. Thank you 30 capsule 11  . ferrous sulfate 325 (65 FE) MG tablet Take 1 tablet (325 mg total) by mouth 2 (two) times daily with a meal. (Patient taking differently: Take 325 mg by mouth daily. ) 180 tablet 1  . flecainide (TAMBOCOR) 150 MG tablet Take 2 tablets (300 mg total) by mouth daily as needed (for Atrial Fibrillation). 10 tablet 1  . inFLIXimab (REMICADE IV) Inject into the vein.    . metoprolol succinate (TOPROL-XL) 25 MG 24 hr tablet TAKE 1 TABLET (25 MG TOTAL) BY MOUTH 2 TIMES DAILY. 180 tablet 0  . Polyethylene Glycol 3350 (MIRALAX PO) Take by mouth daily.    . Cholecalciferol (VITAMIN D3) 1.25 MG (50000 UT) CAPS TAKE 1 CAPSULE (50,000 UNITS TOTAL) BY MOUTH ONCE A WEEK. (Patient taking differently: once a week. ) 12 capsule 0  . terbinafine (LAMISIL AT) 1 % cream Apply cream to affected area twice daily for 3 days 30 g 0   Facility-Administered Medications Prior to Visit  Medication Dose Route Frequency Provider Last Rate Last Dose  . Chlorhexidine Gluconate  Cloth 2 % PADS 6 each  6 each Topical Once Michael Boston, MD       And  . Chlorhexidine Gluconate Cloth 2 % PADS 6 each  6 each Topical Once Michael Boston, MD        ROS Review of Systems  Constitutional: Negative.  Negative for chills, diaphoresis, fatigue and fever.  HENT: Negative.   Eyes: Negative.   Respiratory: Negative for cough, chest tightness, shortness of breath and wheezing.   Cardiovascular: Negative for chest pain, palpitations and leg swelling.  Gastrointestinal: Negative for abdominal pain, constipation, diarrhea, nausea and vomiting.  Endocrine: Negative for polydipsia, polyphagia and polyuria.  Genitourinary: Positive for frequency. Negative for decreased urine volume, difficulty urinating, dysuria, hematuria, pelvic pain, urgency and vaginal discharge.  Musculoskeletal: Negative for arthralgias and myalgias.  Skin: Negative.  Negative for color change and pallor.  Neurological: Negative.  Negative for dizziness, weakness, light-headedness and numbness.  Hematological: Negative for adenopathy. Does not bruise/bleed easily.  Psychiatric/Behavioral: Negative.     Objective:  BP 138/84 (BP Location: Left Arm, Patient Position: Sitting, Cuff Size: Normal)   Pulse 72   Temp 98.5 F (36.9 C) (Oral)   Resp 16   Ht 5' 4"  (1.626 m)   Wt 160 lb (72.6 kg)   SpO2 98%   BMI 27.46 kg/m   BP Readings from Last 3  Encounters:  03/25/19 138/84  03/04/19 (!) 142/76  03/02/19 (!) 148/65    Wt Readings from Last 3 Encounters:  03/25/19 160 lb (72.6 kg)  03/04/19 160 lb (72.6 kg)  03/02/19 154 lb 5.2 oz (70 kg)    Physical Exam Vitals signs reviewed.  Constitutional:      Appearance: Normal appearance.  HENT:     Nose: Nose normal.     Mouth/Throat:     Mouth: Mucous membranes are moist.  Eyes:     General: No scleral icterus.    Conjunctiva/sclera: Conjunctivae normal.  Neck:     Musculoskeletal: Neck supple.  Cardiovascular:     Rate and Rhythm: Normal rate  and regular rhythm.     Heart sounds: No murmur.  Pulmonary:     Effort: Pulmonary effort is normal.     Breath sounds: No stridor. No wheezing, rhonchi or rales.  Abdominal:     General: Abdomen is flat. Bowel sounds are normal. There is no distension.     Palpations: There is no hepatomegaly or splenomegaly.     Tenderness: There is no abdominal tenderness.  Genitourinary:    Comments: GU and rectal exams were deferred at her request today Musculoskeletal: Normal range of motion.     Right lower leg: No edema.     Left lower leg: No edema.  Lymphadenopathy:     Cervical: No cervical adenopathy.  Skin:    General: Skin is warm and dry.  Neurological:     General: No focal deficit present.     Mental Status: She is alert and oriented to person, place, and time. Mental status is at baseline.  Psychiatric:        Mood and Affect: Mood normal.        Behavior: Behavior normal.     Lab Results  Component Value Date   WBC 6.5 03/25/2019   HGB 12.1 03/25/2019   HCT 36.7 03/25/2019   PLT 351.0 03/25/2019   GLUCOSE 104 (H) 01/23/2019   CHOL 148 03/25/2019   TRIG 82.0 03/25/2019   HDL 52.90 03/25/2019   LDLCALC 79 03/25/2019   ALT 38 01/23/2019   AST 31 01/23/2019   NA 141 01/23/2019   K 3.7 01/23/2019   CL 103 01/23/2019   CREATININE 0.73 01/23/2019   BUN 15 01/23/2019   CO2 27 01/23/2019   TSH 0.69 03/19/2018    No results found.  Assessment & Plan:   Julie Clark was seen today for annual exam and hypertension.  Diagnoses and all orders for this visit:  Essential hypertension, benign- Her blood pressure is adequately well controlled.  Iron deficiency anemia due to chronic blood loss- Her H&H are normal now. -     Ferritin; Future -     IBC panel; Future -     CBC with Differential/Platelet; Future  Hyperlipidemia with target LDL less than 160- She does not have an elevated ASCVD risk score so I did not recommend a statin.  Thiamine deficiency- I will monitor her  thiamine level. -     Vitamin B1; Future  Routine general medical examination at a health care facility- Exam completed, labs reviewed, vaccines reviewed, she deferred on a Pap today, colon cancer screening and breast cancer screening are up-to-date, patient education was given. -     Lipid panel; Future  Vitamin B12 deficiency anemia due to intrinsic factor deficiency- Her B12 level and folate levels are normal now. -     Vitamin B12; Future -  Folate; Future  Vitamin D deficiency disease- Her vitamin D level is over 120.  I recommended that she stop taking a vitamin D supplement. -     VITAMIN D 25 Hydroxy (Vit-D Deficiency, Fractures); Future  OAB (overactive bladder)- I will check her urine for glucose, white cells, red blood cells, and infection.  For now, will empirically treat her for overactive bladder. -     Urinalysis, Routine w reflex microscopic; Future -     CULTURE, URINE COMPREHENSIVE; Future -     tolterodine (DETROL LA) 2 MG 24 hr capsule; Take 1 capsule (2 mg total) by mouth daily.   I have discontinued Rodrigo Ran. Cadogan's Vitamin D3 and terbinafine. I am also having her start on tolterodine. Additionally, I am having her maintain her flecainide, Polyethylene Glycol 3350 (MIRALAX PO), ferrous sulfate, inFLIXimab (REMICADE IV), diltiazem, apixaban, acetaminophen, azaTHIOprine, and metoprolol succinate.  Meds ordered this encounter  Medications  . tolterodine (DETROL LA) 2 MG 24 hr capsule    Sig: Take 1 capsule (2 mg total) by mouth daily.    Dispense:  90 capsule    Refill:  1     Follow-up: Return in about 6 months (around 09/23/2019).  Julie Calico, MD

## 2019-03-25 NOTE — Patient Instructions (Signed)
Health Maintenance, Female Adopting a healthy lifestyle and getting preventive care are important in promoting health and wellness. Ask your health care provider about:  The right schedule for you to have regular tests and exams.  Things you can do on your own to prevent diseases and keep yourself healthy. What should I know about diet, weight, and exercise? Eat a healthy diet   Eat a diet that includes plenty of vegetables, fruits, low-fat dairy products, and lean protein.  Do not eat a lot of foods that are high in solid fats, added sugars, or sodium. Maintain a healthy weight Body mass index (BMI) is used to identify weight problems. It estimates body fat based on height and weight. Your health care provider can help determine your BMI and help you achieve or maintain a healthy weight. Get regular exercise Get regular exercise. This is one of the most important things you can do for your health. Most adults should:  Exercise for at least 150 minutes each week. The exercise should increase your heart rate and make you sweat (moderate-intensity exercise).  Do strengthening exercises at least twice a week. This is in addition to the moderate-intensity exercise.  Spend less time sitting. Even light physical activity can be beneficial. Watch cholesterol and blood lipids Have your blood tested for lipids and cholesterol at 63 years of age, then have this test every 5 years. Have your cholesterol levels checked more often if:  Your lipid or cholesterol levels are high.  You are older than 63 years of age.  You are at high risk for heart disease. What should I know about cancer screening? Depending on your health history and family history, you may need to have cancer screening at various ages. This may include screening for:  Breast cancer.  Cervical cancer.  Colorectal cancer.  Skin cancer.  Lung cancer. What should I know about heart disease, diabetes, and high blood  pressure? Blood pressure and heart disease  High blood pressure causes heart disease and increases the risk of stroke. This is more likely to develop in people who have high blood pressure readings, are of African descent, or are overweight.  Have your blood pressure checked: ? Every 3-5 years if you are 18-39 years of age. ? Every year if you are 40 years old or older. Diabetes Have regular diabetes screenings. This checks your fasting blood sugar level. Have the screening done:  Once every three years after age 40 if you are at a normal weight and have a low risk for diabetes.  More often and at a younger age if you are overweight or have a high risk for diabetes. What should I know about preventing infection? Hepatitis B If you have a higher risk for hepatitis B, you should be screened for this virus. Talk with your health care provider to find out if you are at risk for hepatitis B infection. Hepatitis C Testing is recommended for:  Everyone born from 1945 through 1965.  Anyone with known risk factors for hepatitis C. Sexually transmitted infections (STIs)  Get screened for STIs, including gonorrhea and chlamydia, if: ? You are sexually active and are younger than 63 years of age. ? You are older than 63 years of age and your health care provider tells you that you are at risk for this type of infection. ? Your sexual activity has changed since you were last screened, and you are at increased risk for chlamydia or gonorrhea. Ask your health care provider if   you are at risk.  Ask your health care provider about whether you are at high risk for HIV. Your health care provider may recommend a prescription medicine to help prevent HIV infection. If you choose to take medicine to prevent HIV, you should first get tested for HIV. You should then be tested every 3 months for as long as you are taking the medicine. Pregnancy  If you are about to stop having your period (premenopausal) and  you may become pregnant, seek counseling before you get pregnant.  Take 400 to 800 micrograms (mcg) of folic acid every day if you become pregnant.  Ask for birth control (contraception) if you want to prevent pregnancy. Osteoporosis and menopause Osteoporosis is a disease in which the bones lose minerals and strength with aging. This can result in bone fractures. If you are 65 years old or older, or if you are at risk for osteoporosis and fractures, ask your health care provider if you should:  Be screened for bone loss.  Take a calcium or vitamin D supplement to lower your risk of fractures.  Be given hormone replacement therapy (HRT) to treat symptoms of menopause. Follow these instructions at home: Lifestyle  Do not use any products that contain nicotine or tobacco, such as cigarettes, e-cigarettes, and chewing tobacco. If you need help quitting, ask your health care provider.  Do not use street drugs.  Do not share needles.  Ask your health care provider for help if you need support or information about quitting drugs. Alcohol use  Do not drink alcohol if: ? Your health care provider tells you not to drink. ? You are pregnant, may be pregnant, or are planning to become pregnant.  If you drink alcohol: ? Limit how much you use to 0-1 drink a day. ? Limit intake if you are breastfeeding.  Be aware of how much alcohol is in your drink. In the [place] one drink equals one 12 oz bottle of beer (355 mL), one 5 oz glass of wine (148 mL), or one 1 oz glass of hard liquor (44 mL). General instructions  Schedule regular health, dental, and eye exams.  Stay current with your vaccines.  Tell your health care provider if: ? You often feel depressed. ? You have ever been abused or do not feel safe at home. Summary  Adopting a healthy lifestyle and getting preventive care are important in promoting health and wellness.  Follow your health care provider's instructions about healthy  diet, exercising, and getting tested or screened for diseases.  Follow your health care provider's instructions on monitoring your cholesterol and blood pressure. This information is not intended to replace advice given to you by your health care provider. Make sure you discuss any questions you have with your health care provider. Document Released: 11/27/2010 Document Revised: 05/07/2018 Document Reviewed: 05/07/2018 Elsevier Patient Education  2020 Elsevier Inc.  

## 2019-03-26 LAB — FOLATE: Folate: >24.1 ng/mL (ref >5.9)

## 2019-03-26 LAB — VITAMIN B12: Vitamin B-12: 262 pg/mL (ref 211–911)

## 2019-03-26 LAB — FERRITIN: Ferritin: 61.5 ng/mL (ref 10.0–291.0)

## 2019-03-27 ENCOUNTER — Encounter: Payer: Self-pay | Admitting: Internal Medicine

## 2019-03-27 LAB — VITAMIN D 25 HYDROXY (VIT D DEFICIENCY, FRACTURES): VITD: >120 ng/mL

## 2019-03-28 LAB — VITAMIN B1: Vitamin B1 (Thiamine): 13 nmol/L (ref 8–30)

## 2019-04-01 ENCOUNTER — Other Ambulatory Visit (INDEPENDENT_AMBULATORY_CARE_PROVIDER_SITE_OTHER): Payer: 59

## 2019-04-01 DIAGNOSIS — N3281 Overactive bladder: Secondary | ICD-10-CM | POA: Diagnosis not present

## 2019-04-01 LAB — URINALYSIS, ROUTINE W REFLEX MICROSCOPIC
Bilirubin Urine: NEGATIVE
Ketones, ur: NEGATIVE
Leukocytes,Ua: NEGATIVE
Nitrite: NEGATIVE
Specific Gravity, Urine: 1.015 (ref 1.000–1.030)
Total Protein, Urine: NEGATIVE
Urine Glucose: NEGATIVE
Urobilinogen, UA: 0.2 (ref 0.0–1.0)
pH: 7 (ref 5.0–8.0)

## 2019-04-02 LAB — CULTURE, URINE COMPREHENSIVE
MICRO NUMBER:: 1063987
SPECIMEN QUALITY:: ADEQUATE

## 2019-04-05 ENCOUNTER — Encounter: Payer: Self-pay | Admitting: Internal Medicine

## 2019-04-07 MED FILL — ELIQUIS 5 MG TABLET: 5 | 30 days supply | Qty: 60 | Fill #9

## 2019-04-13 ENCOUNTER — Other Ambulatory Visit: Payer: Self-pay

## 2019-04-13 ENCOUNTER — Ambulatory Visit (HOSPITAL_COMMUNITY)
Admission: RE | Admit: 2019-04-13 | Discharge: 2019-04-13 | Disposition: A | Discharge status: Home / Self Care | Payer: 59 | Source: Ambulatory Visit | Attending: Gastroenterology | Admitting: Gastroenterology

## 2019-04-13 DIAGNOSIS — K50119 Crohn's disease of large intestine with unspecified complications: Secondary | ICD-10-CM | POA: Insufficient documentation

## 2019-04-13 MED ORDER — DIPHENHYDRAMINE HCL 25 MG PO CAPS: 25.0000 mg | ORAL_CAPSULE | Freq: Once | ORAL | Status: DC

## 2019-04-13 MED ORDER — ACETAMINOPHEN 325 MG PO TABS: 650.0000 mg | ORAL_TABLET | Freq: Once | ORAL | Status: DC

## 2019-04-13 MED ORDER — SODIUM CHLORIDE 0.9 % IV SOLN
700.0000 mg | INTRAVENOUS | Status: DC
  Administered 2019-04-13: 09:00:00 700 mg via INTRAVENOUS
  Filled 2019-04-13: qty 70

## 2019-04-13 MED ORDER — SODIUM CHLORIDE 0.9 % IV SOLN
INTRAVENOUS | Status: DC | PRN
  Administered 2019-04-13: 250 mL via INTRAVENOUS

## 2019-04-13 MED FILL — AZATHIOPRINE 50 MG TABS: 50 | 30 days supply | Qty: 90 | Fill #1

## 2019-04-13 NOTE — Progress Notes (Signed)
Patient received remicade via PIV. Titrated per protocol. Pre infusion medications were taken by the patient before coming to the [facility]. Tolerated well, vitals stable, discharge instructions given, verbalized understanding. Patient alert, oriented and ambulatory at the time of discharge.

## 2019-04-13 NOTE — Discharge Instructions (Signed)
Infliximab injection What is this medicine? INFLIXIMAB (in [place] i mab) is used to treat Crohn's disease and ulcerative colitis. It is also used to treat ankylosing spondylitis, plaque psoriasis, and some forms of arthritis. This medicine may be used for other purposes; ask your health care provider or pharmacist if you have questions. COMMON BRAND NAME(S): INFLECTRA, Remicade, RENFLEXIS What should I tell my health care provider before I take this medicine? They need to know if you have any of these conditions:  cancer  current or past resident of [place] or [place]  diabetes  exposure to tuberculosis  Guillain-Barre syndrome  heart failure  hepatitis or liver disease  immune system problems  infection  lung or breathing disease, like COPD  multiple sclerosis  receiving phototherapy for the skin  seizure disorder  an unusual or allergic reaction to infliximab, mouse proteins, other medicines, foods, dyes, or preservatives  pregnant or trying to get pregnant  breast-feeding How should I use this medicine? This medicine is for injection into a vein. It is usually given by a health care professional in a hospital or clinic setting. A special MedGuide will be given to you by the pharmacist with each prescription and refill. Be sure to read this information carefully each time. Talk to your pediatrician regarding the use of this medicine in children. While this drug may be prescribed for children as young as 64 years of age for selected conditions, precautions do apply. Overdosage: If you think you have taken too much of this medicine contact a poison control center or emergency room at once. NOTE: This medicine is only for you. Do not share this medicine with others. What if I miss a dose? It is important not to miss your dose. Call your doctor or health care professional if you are unable to keep an appointment. What may interact with this medicine? Do not  take this medicine with any of the following medications:  biologic medicines such as abatacept, adalimumab, anakinra, certolizumab, etanercept, golimumab, rituximab, secukinumab, tocilizumab, tofactinib, ustekinumab  live vaccines This list may not describe all possible interactions. Give your health care provider a list of all the medicines, herbs, non-prescription drugs, or dietary supplements you use. Also tell them if you smoke, drink alcohol, or use illegal drugs. Some items may interact with your medicine. What should I watch for while using this medicine? Your condition will be monitored carefully while you are receiving this medicine. Visit your doctor or health care professional for regular checks on your progress. You may need blood work done while you are taking this medicine. Before beginning therapy, your doctor may do a test to see if you have been exposed to tuberculosis. Call your doctor or health care professional for advice if you get a fever, chills or sore throat, or other symptoms of a cold or flu. Do not treat yourself. This drug decreases your body's ability to fight infections. Try to avoid being around people who are sick. This medicine may make the symptoms of heart failure worse in some patients. If you notice symptoms such as increased shortness of breath or swelling of the ankles or legs, contact your health care provider right away. If you are going to have surgery or dental work, tell your health care professional or dentist that you have received this medicine. If you take this medicine for plaque psoriasis, stay out of the sun. If you cannot avoid being in the sun, wear protective clothing and use sunscreen. Do not  use sun lamps or tanning beds/booths. Talk to your doctor about your risk of cancer. You may be more at risk for certain types of cancers if you take this medicine. What side effects may I notice from receiving this medicine? Side effects that you should  report to your doctor or health care professional as soon as possible:  allergic reactions like skin rash, itching or hives, swelling of the face, lips, or tongue  breathing problems  changes in vision  chest pain  fever or chills, usually related to the infusion  joint pain  pain, tingling, numbness in the hands or feet  redness, blistering, peeling or loosening of the skin, including inside the mouth  seizures  signs of infection - fever or chills, cough, sore throat, flu-like symptoms, pain or difficulty passing urine  signs and symptoms of liver injury like dark yellow or brown urine; general ill feeling; light-colored stools; loss of appetite; nausea; right upper belly pain; unusually weak or tired; yellowing of the eyes or skin  signs and symptoms of a stroke like changes in vision; confusion; trouble speaking or understanding; severe headaches; sudden numbness or weakness of the face, arm or leg; trouble walking; dizziness; loss of balance or coordination  swelling of the ankles, feet, or hands  swollen lymph nodes in the neck, underarm, or groin areas  unusual bleeding or bruising  unusually weak or tired Side effects that usually do not require medical attention (report to your doctor or health care professional if they continue or are bothersome):  headache  nausea  stomach pain  upset stomach This list may not describe all possible side effects. Call your doctor for medical advice about side effects. You may report side effects to FDA at 1-800-FDA-1088. Where should I keep my medicine? This drug is given in a hospital or clinic and will not be stored at home. NOTE: This sheet is a summary. It may not cover all possible information. If you have questions about this medicine, talk to your doctor, pharmacist, or health care provider.  2020 Elsevier/Gold Standard (2016-06-13 13:45:32)

## 2019-04-14 ENCOUNTER — Ambulatory Visit (INDEPENDENT_AMBULATORY_CARE_PROVIDER_SITE_OTHER): Payer: 59 | Admitting: Gastroenterology

## 2019-04-14 ENCOUNTER — Encounter: Payer: Self-pay | Admitting: Gastroenterology

## 2019-04-14 VITALS — BP 122/62 | HR 64 | Temp 97.8°F | Ht 64.0 in | Wt 162.4 lb

## 2019-04-14 DIAGNOSIS — K50119 Crohn's disease of large intestine with unspecified complications: Secondary | ICD-10-CM | POA: Diagnosis not present

## 2019-04-14 NOTE — Patient Instructions (Signed)
Continue current medications. 

## 2019-04-14 NOTE — Progress Notes (Signed)
Review of pertinent gastrointestinal problems: 1.Crohn's disease:Crohn's colitis withsevereperianal disease.No evidence of small bowel disease on5/2019 CT enterography. Colonoscopy 09/2017 showed normal terminal ileum, patchy deep ulcers segmentally throughout her colon, severe perianal disease with fistulas and early abscess that was treated byoral Abx,debridement, seton placement 09/2017, Dr. Johney Maine.Colonoscopy 12/2012 for average risk screening; incidental inflammation right colon (path suggested possible IBD) was attributed to NSAID use at the time.  Labs 09/2017: TB quant gold negative, Hepatitis B Surface Ab negative, Surface Ag negative, Hep C Ab negative; TPMT activity level NORMAL.  TB quant gold 10/2018 negative  Dual therapy with immunomodulators(azathiaprine)and biologic(humira)started 09/2017. Poor response of perianal disease led to drug level testing (01/2018) adalimumab was 5.6 (low) at trough, no antibodies detected. Humira increased to 80m every other week. Continued poor perianal response led to repeat drug testing 03/2018) adalimumab trough level was 6.8 (still lower than ideal) with no antibodies detected. 03/2018 Changed humira dosing to 457monce weekly, also increased azathiaprine to 15017mnce daily.2020 despite 40 mg once weekly Humira her drug levels were lower than acceptable. Changedto 5 mg/kg Remicade 2020.  Remicade drug levels 08/2018: suboptimal (1.3mc20mL at 5mg/1mremicade). ADA undetectable. Increased remicade to 10mg/39m10 mg/kg Remicade drug levels still low, requested 10 mg/kg Remicade every 6 weeks however this was denied by her insurance company. Formal appeals process started in late August 2020.  Eventually this was OK'd by her insurance company  Exam under anesthesia8/28/2020with Dr. ChristNadeen Landausion and drainage of 2 small separate abscess cavities perianal. Also placement of 4 separate, new perianal setons.    Blood  work October 2020 CBC was normal   HPI: This is a very pleasant 63 yea23old woman whom I last saw about a month and a half ago she was having some discharge from her umbilicus which completely dried up and she has not had a problem with it since.  Her perianal disease she feels is slowly improving.  Certainly the setons are not comfortable.  She is having no abdominal pains.  She has 1 bowel movement a day generally soft semiformed.  She does not have significant diarrhea.  She had her second dose of every 6-week 10 mg/kg Remicade yesterday  Chief complaint is Crohn's colitis with severe perianal disease  ROS: complete GI ROS as described in HPI, all other review negative.  Constitutional:  No unintentional weight loss   Past Medical History:  Diagnosis Date  . Anticoagulant long-term use    eliquis, managed by cardiology  . Crohn's disease of perianal region [facility] dr [facility]ac[facility]dis [facility])  . Heart murmur   . Hemorrhoids    internal and external  . History of hidradenitis suppurativa    axilla  . Hyperlipidemia   . Hypertension   . PAF (paroxysmal atrial fibrillation) (HCC) [facility] camnitCurt Bearsrst [facility] 07-18-2015  . Rectal pain   . Uterine fibroid    per MRI 09-16-2017  . Wears glasses   . Wears glasses     Past Surgical History:  Procedure Laterality Date  . CESAREAN SECTION  x2  last one 1996  . COLONOSCOPY WITH PROPOFOL  last one 05-14-2018  dr d. jacobsArdis Hughsauer GI  . INCISION AND DRAINAGE ABSCESS N/A 10/11/2017   Procedure: DRAINAGE OF PERIRECTAL ABSCESSES;  Surgeon: Gross,Michael [place];  Laterality: N/A;  . PLACEMENT OF SETON N/A 10/11/2017  Procedure: PLACEMENT OF SETON;  Surgeon: Michael Boston, MD;  Location: [facility];  Service: General;  Laterality: N/A;  . PLACEMENT OF SETON N/A 01/23/2019   Procedure: PLACEMENT OF SETONS x4;  Surgeon: Ileana Roup, MD;  Location: [place];  Service: General;  Laterality: N/A;  . RECTAL EXAM UNDER ANESTHESIA N/A 10/11/2017   Procedure: ANORECTAL EXAM UNDER ANESTHESIA;  Surgeon: Michael Boston, MD;  Location: [place];  Service: General;  Laterality: N/A;  . RECTAL EXAM UNDER ANESTHESIA N/A 01/23/2019   Procedure: ANORECTAL EXAM UNDER ANESTHESIA,  INCISION AND DRAINAGE x2;  Surgeon: Ileana Roup, MD;  Location: [place];  Service: General;  Laterality: N/A;  . TRANSTHORACIC ECHOCARDIOGRAM  10/08/2012   ef 50-55%,  grade 1 diastolic dysfunction/  trivial MR and TR/  mild LAE    Current Outpatient Medications  Medication Sig Dispense Refill  . acetaminophen (TYLENOL) 500 MG tablet Take 500 mg by mouth every 6 (six) hours as needed.    Marland Kitchen apixaban (ELIQUIS) 5 MG TABS tablet Take 1 tablet (5 mg total) by mouth 2 (two) times daily. 60 tablet 11  . azaTHIOprine (IMURAN) 50 MG tablet Take 3 tablets (150 mg total) by mouth daily with lunch. 90 tablet 3  . diltiazem (CARTIA XT) 120 MG 24 hr capsule Take 1 capsule (120 mg total) by mouth daily. Please keep upcoming appt in February for future refills. Thank you 30 capsule 11  . ferrous sulfate 325 (65 FE) MG tablet Take 1 tablet (325 mg total) by mouth 2 (two) times daily with a meal. (Patient taking differently: Take 325 mg by mouth daily. ) 180 tablet 1  . flecainide (TAMBOCOR) 150 MG tablet Take 2 tablets (300 mg total) by mouth daily as needed (for Atrial Fibrillation). 10 tablet 1  . inFLIXimab (REMICADE IV) Inject into the vein.    . metoprolol succinate (TOPROL-XL) 25 MG 24 hr tablet TAKE 1 TABLET (25 MG TOTAL) BY MOUTH 2 TIMES DAILY. 180 tablet 0  . Polyethylene Glycol 3350 (MIRALAX PO) Take by mouth daily.    Marland Kitchen tolterodine (DETROL LA) 2 MG 24 hr capsule Take 1 capsule (2 mg total) by mouth daily. 90 capsule 1   No current facility-administered medications for this visit.    Facility-Administered  Medications Ordered in Other Visits  Medication Dose Route Frequency Provider Last Rate Last Dose  . Chlorhexidine Gluconate Cloth 2 % PADS 6 each  6 each Topical Once Michael Boston, MD       And  . Chlorhexidine Gluconate Cloth 2 % PADS 6 each  6 each Topical Once Michael Boston, MD        Allergies as of 04/14/2019 - Review Complete 04/14/2019  Allergen Reaction Noted  . Latex Rash 10/10/2017  . Lovastatin  08/11/2012  . Protonix [pantoprazole] Diarrhea 10/10/2017  . Adhesive [tape] Rash 10/10/2017  . Penicillins Rash 08/11/2012    Family History  Problem Relation Age of Onset  . Stroke Mother   . Hypertension Mother   . Hyperlipidemia Mother   . Thyroid disease Mother   . Prostate cancer Father   . Colon cancer Maternal Aunt   . Diabetes Neg Hx   . Depression Neg Hx   . Early death Neg Hx   . Hearing loss Neg Hx   . Heart disease Neg Hx   . Kidney disease Neg Hx   . Alcohol abuse Neg Hx   . Breast cancer  Neg Hx   . Esophageal cancer Neg Hx   . Stomach cancer Neg Hx   . Rectal cancer Neg Hx     Social History   Socioeconomic History  . Marital status: Married    Spouse name: Not on file  . Number of children: Not on file  . Years of education: Not on file  . Highest education level: Not on file  Occupational History  . Occupation: registered Optician, dispensing: [employer]  . Financial resource strain: Not on file  . Food insecurity    Worry: Not on file    Inability: Not on file  . Transportation needs    Medical: Not on file    Non-medical: Not on file  Tobacco Use  . Smoking status: Never Smoker  . Smokeless tobacco: Never Used  Substance and Sexual Activity  . Alcohol use: No  . Drug use: No  . Sexual activity: Not on file  Lifestyle  . Physical activity    Days per week: Not on file    Minutes per session: Not on file  . Stress: Not on file  Relationships  . Social Herbalist on phone: Not on file    Gets together:  Not on file    Attends religious service: Not on file    Active member of club or organization: Not on file    Attends meetings of clubs or organizations: Not on file    Relationship status: Not on file  . Intimate partner violence    Fear of current or ex partner: Not on file    Emotionally abused: Not on file    Physically abused: Not on file    Forced sexual activity: Not on file  Other Topics Concern  . Not on file  Social History Narrative  . Not on file     Physical Exam: BP 122/62   Pulse 64   Temp 97.8 F (36.6 C)   Ht 5' 4"  (1.626 m)   Wt 162 lb 6.4 oz (73.7 kg)   BMI 27.88 kg/m  Constitutional: generally well-appearing Psychiatric: alert and oriented x3 Abdomen: soft, nontender, nondistended, no obvious ascites, no peritoneal signs, normal bowel sounds No peripheral edema noted in lower extremities Rectal exam: Perianal induration is even more improved than 2 months ago.  She still has a couple of nickel sized indurated areas near 2 of the setons but most of the other induration has resolved.  No purulence no openings.   Assessment and plan: 63 y.o. female with Crohn's colitis with severe perianal disease  I am encouraged and I know she is also.  Her perianal disease is clearly better than 2 months ago which was clearly better than prior to her new 4 setons being placed.  I think the setons are obviously helping and also her Remicade at 10 mg/kg grams every 6 weeks.  She just had her second such dose yesterday.  She is still on azathioprine 2.5 mg/kg as well.  No changes to her medical therapy for now.  She will return to see me in about 2 months time.  She knows to contact me sooner if she is having any issues.  Please see the "Patient Instructions" section for addition details about the plan.  Owens Loffler, MD [facility] 04/14/2019, 1:08 PM

## 2019-05-05 ENCOUNTER — Encounter (HOSPITAL_COMMUNITY): Payer: 59

## 2019-05-07 MED FILL — ELIQUIS 5 MG TABLET: 5 | 30 days supply | Qty: 60 | Fill #10

## 2019-05-11 ENCOUNTER — Encounter (HOSPITAL_COMMUNITY): Payer: 59

## 2019-05-11 MED FILL — AZATHIOPRINE 50 MG TABS: 50 | 30 days supply | Qty: 90 | Fill #2

## 2019-05-25 ENCOUNTER — Other Ambulatory Visit: Payer: Self-pay

## 2019-05-25 ENCOUNTER — Ambulatory Visit (HOSPITAL_COMMUNITY)
Admission: RE | Admit: 2019-05-25 | Discharge: 2019-05-25 | Disposition: A | Discharge status: Home / Self Care | Payer: 59 | Source: Ambulatory Visit | Attending: Gastroenterology | Admitting: Gastroenterology

## 2019-05-25 DIAGNOSIS — K5090 Crohn's disease, unspecified, without complications: Secondary | ICD-10-CM | POA: Diagnosis not present

## 2019-05-25 DIAGNOSIS — K50119 Crohn's disease of large intestine with unspecified complications: Secondary | ICD-10-CM | POA: Diagnosis present

## 2019-05-25 MED ORDER — ACETAMINOPHEN 325 MG PO TABS: 650.0000 mg | ORAL_TABLET | Freq: Once | ORAL | Status: DC

## 2019-05-25 MED ORDER — SODIUM CHLORIDE 0.9 % IV SOLN
10.0000 mg/kg | INTRAVENOUS | Status: DC
  Administered 2019-05-25: 10:00:00 700 mg via INTRAVENOUS
  Filled 2019-05-25: qty 70

## 2019-05-25 MED ORDER — DIPHENHYDRAMINE HCL 25 MG PO CAPS: 25.0000 mg | ORAL_CAPSULE | Freq: Once | ORAL | Status: DC

## 2019-05-25 MED ORDER — SODIUM CHLORIDE 0.9 % IV SOLN
INTRAVENOUS | Status: DC | PRN
  Administered 2019-05-25: 250 mL via INTRAVENOUS

## 2019-05-25 NOTE — Discharge Instructions (Signed)
Infliximab injection What is this medicine? INFLIXIMAB (in [place] i mab) is used to treat Crohn's disease and ulcerative colitis. It is also used to treat ankylosing spondylitis, plaque psoriasis, and some forms of arthritis. This medicine may be used for other purposes; ask your health care provider or pharmacist if you have questions. COMMON BRAND NAME(S): INFLECTRA, Remicade, RENFLEXIS What should I tell my health care provider before I take this medicine? They need to know if you have any of these conditions:  cancer  current or past resident of [place] or [place]  diabetes  exposure to tuberculosis  Guillain-Barre syndrome  heart failure  hepatitis or liver disease  immune system problems  infection  lung or breathing disease, like COPD  multiple sclerosis  receiving phototherapy for the skin  seizure disorder  an unusual or allergic reaction to infliximab, mouse proteins, other medicines, foods, dyes, or preservatives  pregnant or trying to get pregnant  breast-feeding How should I use this medicine? This medicine is for injection into a vein. It is usually given by a health care professional in a hospital or clinic setting. A special MedGuide will be given to you by the pharmacist with each prescription and refill. Be sure to read this information carefully each time. Talk to your pediatrician regarding the use of this medicine in children. While this drug may be prescribed for children as young as 58 years of age for selected conditions, precautions do apply. Overdosage: If you think you have taken too much of this medicine contact a poison control center or emergency room at once. NOTE: This medicine is only for you. Do not share this medicine with others. What if I miss a dose? It is important not to miss your dose. Call your doctor or health care professional if you are unable to keep an appointment. What may interact with this medicine? Do not  take this medicine with any of the following medications:  biologic medicines such as abatacept, adalimumab, anakinra, certolizumab, etanercept, golimumab, rituximab, secukinumab, tocilizumab, tofactinib, ustekinumab  live vaccines This list may not describe all possible interactions. Give your health care provider a list of all the medicines, herbs, non-prescription drugs, or dietary supplements you use. Also tell them if you smoke, drink alcohol, or use illegal drugs. Some items may interact with your medicine. What should I watch for while using this medicine? Your condition will be monitored carefully while you are receiving this medicine. Visit your doctor or health care professional for regular checks on your progress. You may need blood work done while you are taking this medicine. Before beginning therapy, your doctor may do a test to see if you have been exposed to tuberculosis. Call your doctor or health care professional for advice if you get a fever, chills or sore throat, or other symptoms of a cold or flu. Do not treat yourself. This drug decreases your body's ability to fight infections. Try to avoid being around people who are sick. This medicine may make the symptoms of heart failure worse in some patients. If you notice symptoms such as increased shortness of breath or swelling of the ankles or legs, contact your health care provider right away. If you are going to have surgery or dental work, tell your health care professional or dentist that you have received this medicine. If you take this medicine for plaque psoriasis, stay out of the sun. If you cannot avoid being in the sun, wear protective clothing and use sunscreen. Do not  use sun lamps or tanning beds/booths. Talk to your doctor about your risk of cancer. You may be more at risk for certain types of cancers if you take this medicine. What side effects may I notice from receiving this medicine? Side effects that you should  report to your doctor or health care professional as soon as possible:  allergic reactions like skin rash, itching or hives, swelling of the face, lips, or tongue  breathing problems  changes in vision  chest pain  fever or chills, usually related to the infusion  joint pain  pain, tingling, numbness in the hands or feet  redness, blistering, peeling or loosening of the skin, including inside the mouth  seizures  signs of infection - fever or chills, cough, sore throat, flu-like symptoms, pain or difficulty passing urine  signs and symptoms of liver injury like dark yellow or brown urine; general ill feeling; light-colored stools; loss of appetite; nausea; right upper belly pain; unusually weak or tired; yellowing of the eyes or skin  signs and symptoms of a stroke like changes in vision; confusion; trouble speaking or understanding; severe headaches; sudden numbness or weakness of the face, arm or leg; trouble walking; dizziness; loss of balance or coordination  swelling of the ankles, feet, or hands  swollen lymph nodes in the neck, underarm, or groin areas  unusual bleeding or bruising  unusually weak or tired Side effects that usually do not require medical attention (report to your doctor or health care professional if they continue or are bothersome):  headache  nausea  stomach pain  upset stomach This list may not describe all possible side effects. Call your doctor for medical advice about side effects. You may report side effects to FDA at 1-800-FDA-1088. Where should I keep my medicine? This drug is given in a hospital or clinic and will not be stored at home. NOTE: This sheet is a summary. It may not cover all possible information. If you have questions about this medicine, talk to your doctor, pharmacist, or health care provider.  2020 Elsevier/Gold Standard (2016-06-13 13:45:32)

## 2019-05-25 NOTE — Progress Notes (Signed)
Patient Care Center Note   Diagnosis:Crohn's Disease   Provider:Jacobs, Melene Plan. MD   Procedure:IV Remicade   Note:Patient received IV Remicade via PIV and tolerated well. Vitals were monitor during course of treatment and were stable. Discharge instructions given. Patient alert, oriented and ambulatory at time of discharge.

## 2019-06-06 MED FILL — CARTIA XT 120 MG CP24: 120 | 90 days supply | Qty: 90 | Fill #3

## 2019-06-06 MED FILL — ELIQUIS 5 MG TABLET: 5 | 30 days supply | Qty: 60 | Fill #11

## 2019-06-12 ENCOUNTER — Ambulatory Visit (INDEPENDENT_AMBULATORY_CARE_PROVIDER_SITE_OTHER): Payer: 59 | Admitting: Gastroenterology

## 2019-06-12 ENCOUNTER — Encounter: Payer: Self-pay | Admitting: Gastroenterology

## 2019-06-12 VITALS — BP 146/62 | HR 80 | Temp 98.6°F | Ht 64.0 in | Wt 164.0 lb

## 2019-06-12 DIAGNOSIS — K50119 Crohn's disease of large intestine with unspecified complications: Secondary | ICD-10-CM

## 2019-06-12 DIAGNOSIS — K50114 Crohn's disease of large intestine with abscess: Secondary | ICD-10-CM | POA: Diagnosis not present

## 2019-06-12 MED FILL — AZATHIOPRINE 50 MG TABS: 50 | 30 days supply | Qty: 90 | Fill #3

## 2019-06-12 NOTE — Progress Notes (Signed)
Review of pertinent gastrointestinal problems: 1.Crohn's disease:Crohn's colitis withsevereperianal disease.No evidence of small bowel disease on5/2019 CT enterography. Colonoscopy 09/2017 showed normal terminal ileum, patchy deep ulcers segmentally throughout her colon, severe perianal disease with fistulas and early abscess that was treated byoral Abx,debridement, seton placement 09/2017, Dr. Johney Maine.Colonoscopy 12/2012 for average risk screening; incidental inflammation right colon (path suggested possible IBD) was attributed to NSAID use at the time.  Labs 09/2017: TB quant gold negative, Hepatitis B Surface Ab negative, Surface Ag negative, Hep C Ab negative; TPMT activity level NORMAL.  TB quant gold 10/2018 negative  Dual therapy with immunomodulators(azathiaprine)and biologic(humira)started 09/2017. Poor response of perianal disease led to drug level testing (01/2018) adalimumab was 5.6 (low) at trough, no antibodies detected. Humira increased to 18m every other week. Continued poor perianal response led to repeat drug testing 03/2018) adalimumab trough level was 6.8 (still lower than ideal) with no antibodies detected. 03/2018 Changed humira dosing to 426monce weekly, also increased azathiaprine to 15029mnce daily.2020 despite 40 mg once weekly Humira her drug levels were lower than acceptable. Changedto 5 mg/kg Remicade 2020.  Remicade drug levels 08/2018: suboptimal (1.3mc75mL at 5mg/55mremicade). ADA undetectable. Increased remicade to 10mg/66m10 mg/kg Remicade drug levels still low, requested 10 mg/kg Remicade every 6 weeks however this was denied by her insurance company. Formal appeals process started in late August 2020.  Eventually this was OK'd by her insurance company  Exam under anesthesia8/28/2020with Dr. ChristNadeen Landausion and drainage of 2 small separate abscess cavities perianal. Also placement of 4 separate, new perianal setons.   HPI: This  is a very pleasant 63 yea13old woman whom I last formally saw here in the office about 2 months ago.  She has had 3 or 4 Remicade doses at 10 mg/kg every 6 weeks.  Her last was 3 weeks ago.  She is continuing on azathioprine at 2.5 mg/kg.  Her bowels are completely normal.  She does not have abdominal pains.  She does not have any bleeding.  She has a soft formed stool once a day very rarely twice a day.  She still has some discomfort perianally she feels they are overall getting a bit better  ROS: complete GI ROS as described in HPI, all other review negative.  Constitutional:  No unintentional weight loss   Past Medical History:  Diagnosis Date  . Anticoagulant long-term use    eliquis, managed by cardiology  . Crohn's disease of perianal region [facility] jac[facility]dis [facility])  . Heart murmur   . Hemorrhoids    internal and external  . History of hidradenitis suppurativa    axilla  . Hyperlipidemia   . Hypertension   . PAF (paroxysmal atrial fibrillation) ([facility]) [facility]  . Rectal pain   . Uterine fibroid    per MRI 09-16-2017  . Wears glasses   . Wears glasses     Past Surgical History:  Procedure Laterality Date  . CESAREAN SECTION  x2  last one 1996  . COLONOSCOPY WITH PROPOFOL  last one 05-14-2018  dr d. jacobsArdis Hughsauer GI  . INCISION AND DRAINAGE ABSCESS N/A 10/11/2017   Procedure: DRAINAGE OF PERIRECTAL ABSCESSES;  Surgeon: Gross,Michael Boston [facility];  Laterality: N/A;  . [employer]/17/2019   Procedure: PLACEMENT OF SETON;  Surgeon: Gross,Michael Boston [place]  [place];  Service: General;  Laterality: N/A;  . PLACEMENT OF SETON N/A 01/23/2019   Procedure: PLACEMENT OF SETONS x4;  Surgeon: Ileana Roup, MD;  Location: [place];  Service: General;  Laterality: N/A;  . RECTAL EXAM UNDER ANESTHESIA N/A  10/11/2017   Procedure: ANORECTAL EXAM UNDER ANESTHESIA;  Surgeon: Michael Boston, MD;  Location: [place];  Service: General;  Laterality: N/A;  . RECTAL EXAM UNDER ANESTHESIA N/A 01/23/2019   Procedure: ANORECTAL EXAM UNDER ANESTHESIA,  INCISION AND DRAINAGE x2;  Surgeon: Ileana Roup, MD;  Location: [place];  Service: General;  Laterality: N/A;  . TRANSTHORACIC ECHOCARDIOGRAM  10/08/2012   ef 50-55%,  grade 1 diastolic dysfunction/  trivial MR and TR/  mild LAE    Current Outpatient Medications  Medication Sig Dispense Refill  . acetaminophen (TYLENOL) 500 MG tablet Take 500 mg by mouth every 6 (six) hours as needed.    Marland Kitchen apixaban (ELIQUIS) 5 MG TABS tablet Take 1 tablet (5 mg total) by mouth 2 (two) times daily. 60 tablet 11  . azaTHIOprine (IMURAN) 50 MG tablet Take 3 tablets (150 mg total) by mouth daily with lunch. 90 tablet 3  . diltiazem (CARTIA XT) 120 MG 24 hr capsule Take 1 capsule (120 mg total) by mouth daily. Please keep upcoming appt in February for future refills. Thank you 30 capsule 11  . ferrous sulfate 325 (65 FE) MG tablet Take 1 tablet (325 mg total) by mouth 2 (two) times daily with a meal. (Patient taking differently: Take 325 mg by mouth daily. ) 180 tablet 1  . flecainide (TAMBOCOR) 150 MG tablet Take 2 tablets (300 mg total) by mouth daily as needed (for Atrial Fibrillation). 10 tablet 1  . inFLIXimab (REMICADE IV) Inject into the vein.    . metoprolol succinate (TOPROL-XL) 25 MG 24 hr tablet TAKE 1 TABLET (25 MG TOTAL) BY MOUTH 2 TIMES DAILY. 180 tablet 0  . Polyethylene Glycol 3350 (MIRALAX PO) Take by mouth daily.    Marland Kitchen tolterodine (DETROL LA) 2 MG 24 hr capsule Take 1 capsule (2 mg total) by mouth daily. 90 capsule 1   No current facility-administered medications for this visit.   Facility-Administered Medications Ordered in Other Visits  Medication Dose Route Frequency Provider Last Rate Last Admin  . Chlorhexidine  Gluconate Cloth 2 % PADS 6 each  6 each Topical Once Michael Boston, MD       And  . Chlorhexidine Gluconate Cloth 2 % PADS 6 each  6 each Topical Once Michael Boston, MD        Allergies as of 06/12/2019 - Review Complete 06/12/2019  Allergen Reaction Noted  . Latex Rash 10/10/2017  . Lovastatin  08/11/2012  . Protonix [pantoprazole] Diarrhea 10/10/2017  . Adhesive [tape] Rash 10/10/2017  . Penicillins Rash 08/11/2012    Family History  Problem Relation Age of Onset  . Stroke Mother   . Hypertension Mother   . Hyperlipidemia Mother   . Thyroid disease Mother   . Prostate cancer Father   . Colon cancer Maternal Aunt   . Diabetes Neg Hx   . Depression Neg Hx   . Early death Neg Hx   . Hearing loss Neg Hx   . Heart disease Neg Hx   . Kidney disease Neg Hx   . Alcohol abuse Neg Hx   . Breast cancer Neg Hx   . Esophageal cancer Neg Hx   . Stomach  cancer Neg Hx   . Rectal cancer Neg Hx     Social History   Socioeconomic History  . Marital status: Married    Spouse name: Not on file  . Number of children: Not on file  . Years of education: Not on file  . Highest education level: Not on file  Occupational History  . Occupation: registered Optician, dispensing: [employer]  Tobacco Use  . Smoking status: Never Smoker  . Smokeless tobacco: Never Used  Substance and Sexual Activity  . Alcohol use: No  . Drug use: No  . Sexual activity: Not on file  Other Topics Concern  . Not on file  Social History Narrative  . Not on file   Social Determinants of Health   Financial Resource Strain:   . Difficulty of Paying Living Expenses: Not on file  Food Insecurity:   . Worried About Charity fundraiser in the Last Year: Not on file  . Ran Out of Food in the Last Year: Not on file  Transportation Needs:   . Lack of Transportation (Medical): Not on file  . Lack of Transportation (Non-Medical): Not on file  Physical Activity:   . Days of Exercise per Week: Not on file  .  Minutes of Exercise per Session: Not on file  Stress:   . Feeling of Stress : Not on file  Social Connections:   . Frequency of Communication with Friends and Family: Not on file  . Frequency of Social Gatherings with Friends and Family: Not on file  . Attends Religious Services: Not on file  . Active Member of Clubs or Organizations: Not on file  . Attends Archivist Meetings: Not on file  . Marital Status: Not on file  Intimate Partner Violence:   . Fear of Current or Ex-Partner: Not on file  . Emotionally Abused: Not on file  . Physically Abused: Not on file  . Sexually Abused: Not on file     Physical Exam: BP (!) 146/62 (BP Location: Left Arm, Patient Position: Sitting, Cuff Size: Normal)   Pulse 80   Temp 98.6 F (37 C)   Ht 5' 4"  (1.626 m)   Wt 164 lb (74.4 kg)   BMI 28.15 kg/m  Constitutional: generally well-appearing Psychiatric: alert and oriented x3 Abdomen: soft, nontender, nondistended, no obvious ascites, no peritoneal signs, normal bowel sounds No peripheral edema noted in lower extremities Rectal examination with female assistant in the room: 3 setons remain in place.  1 very close to the anus and the tissue underneath is healing very nicely, no sign of induration or persistent drainage another 1 is on the right buttocks near midline, the tissue underneath is completely normal.  Third seton is left buttocks and there is indurated tissue that is somewhat sore about 2 or 3 cm across.  Does not seem like an abscess but clearly irritated and still draining if I squeeze it.  The site where the fourth seton it used to be is indurated and I can express some purulence from it.  It is about 1-1/2 to 2 cm across.  Assessment and plan: 64 y.o. female with Crohn's colitis with severe perianal disease  Current medical regimen and setons seem to be slowly improving her severe perianal disease.  One of the setons fell out inadvertently so she still has 3 in place.  The  site where the seton fell out inadvertently is a bit angry, indurated and I think it might  require seton replacement.  One of the other sites of seton placement is still indurated, swollen and draining a bit.  That seton can clearly not be removed and it might need further setons added there.  The site of 2 other setons are very nicely healing.  One is completely healed the other is nearly completely healed.  Overall I am encouraged.  We will check trough Remicade drug level and antibody level the morning of her next Remicade infusion which will be about 3 weeks from now.  We will get her back into see Dr. Nadeen Landau at [facility] to consider removing 2 of the setons and possibly replace or revise at another 2 sites.  She will return to see me in 2 months.  We again discussed diverting colostomy or ileostomy she is still quite adamant that she would not want that to be done even though she knows it would heal her perianal disease much more quickly.  Please see the "Patient Instructions" section for addition details about the plan.  Owens Loffler, MD [facility] 06/12/2019, 2:14 PM

## 2019-06-12 NOTE — Patient Instructions (Addendum)
You will need to come pick up lab form and kit before next Remicade infusion - Antibody and Trough levels.   We have sent referral to [facility]- Dr. Annye English. Their office will contact you with appointment.   You will need a 2 month office follow-up with Dr. Ardis Hughs. (07/2019).Office will  contact you later to schedule.   If you are age 64 or younger, your body mass index should be between 19-25. Your Body mass index is 28.15 kg/m. If this is out of the aformentioned range listed, please consider follow up with your Primary Care Provider.   Due to recent changes in healthcare laws, you may see the results of your imaging and laboratory studies on MyChart before your provider has had a chance to review them.  We understand that in some cases there may be results that are confusing or concerning to you. Not all laboratory results come back in the same time frame and the provider may be waiting for multiple results in order to interpret others.  Please give [place] 48 hours in order for your provider to thoroughly review all the results before contacting the office for clarification of your results.   Thank you for choosing me and [facility].  Dr. Ardis Hughs

## 2019-06-17 ENCOUNTER — Other Ambulatory Visit: Payer: Self-pay | Admitting: Internal Medicine

## 2019-06-17 DIAGNOSIS — Z1231 Encounter for screening mammogram for malignant neoplasm of breast: Secondary | ICD-10-CM

## 2019-06-22 ENCOUNTER — Telehealth: Payer: Self-pay

## 2019-06-22 ENCOUNTER — Other Ambulatory Visit: Payer: Self-pay | Admitting: Internal Medicine

## 2019-06-22 DIAGNOSIS — I10 Essential (primary) hypertension: Secondary | ICD-10-CM

## 2019-06-22 DIAGNOSIS — I480 Paroxysmal atrial fibrillation: Secondary | ICD-10-CM

## 2019-06-22 MED FILL — METOPROLOL SUCCINATE ER 25: 25 | 90 days supply | Qty: 180 | Fill #0

## 2019-06-22 NOTE — Telephone Encounter (Signed)
erx has been sent.

## 2019-06-22 NOTE — Telephone Encounter (Signed)
New message     1. Which medications need to be refilled? (please list name of each medication and dose if known) metoprolol succinate (TOPROL-XL) 25 MG 24 hr tablet  2. Which pharmacy/location (including street and city if local pharmacy) is medication to be sent to? [facility]   3. Do they need a 30 day or 90 day supply? 90 days supply

## 2019-07-06 ENCOUNTER — Ambulatory Visit (HOSPITAL_COMMUNITY)
Admission: RE | Admit: 2019-07-06 | Discharge: 2019-07-06 | Disposition: A | Discharge status: Home / Self Care | Payer: 59 | Source: Ambulatory Visit | Attending: Gastroenterology | Admitting: Gastroenterology

## 2019-07-06 ENCOUNTER — Other Ambulatory Visit: Payer: Self-pay | Admitting: Cardiology

## 2019-07-06 ENCOUNTER — Other Ambulatory Visit: Payer: Self-pay

## 2019-07-06 DIAGNOSIS — K5000 Crohn's disease of small intestine without complications: Secondary | ICD-10-CM | POA: Diagnosis not present

## 2019-07-06 DIAGNOSIS — K50118 Crohn's disease of large intestine with other complication: Secondary | ICD-10-CM | POA: Diagnosis not present

## 2019-07-06 MED ORDER — ACETAMINOPHEN 325 MG PO TABS: 650.0000 mg | ORAL_TABLET | Freq: Once | ORAL | Status: DC

## 2019-07-06 MED ORDER — DIPHENHYDRAMINE HCL 25 MG PO CAPS: 25.0000 mg | ORAL_CAPSULE | Freq: Once | ORAL | Status: DC

## 2019-07-06 MED ORDER — SODIUM CHLORIDE 0.9 % IV SOLN
INTRAVENOUS | Status: DC | PRN
  Administered 2019-07-06: 250 mL via INTRAVENOUS

## 2019-07-06 MED ORDER — SODIUM CHLORIDE 0.9 % IV SOLN
700.0000 mg | INTRAVENOUS | Status: DC
  Administered 2019-07-06: 700 mg via INTRAVENOUS
  Filled 2019-07-06: qty 70

## 2019-07-06 MED FILL — ELIQUIS 5 MG TABLET: 5 | 30 days supply | Qty: 60 | Fill #0

## 2019-07-06 NOTE — Discharge Instructions (Signed)
Infliximab injection What is this medicine? INFLIXIMAB (in [place] i mab) is used to treat Crohn's disease and ulcerative colitis. It is also used to treat ankylosing spondylitis, plaque psoriasis, and some forms of arthritis. This medicine may be used for other purposes; ask your health care provider or pharmacist if you have questions. COMMON BRAND NAME(S): AVSOLA, INFLECTRA, Remicade, RENFLEXIS What should I tell my health care provider before I take this medicine? They need to know if you have any of these conditions:  cancer  current or past resident of [place] or [place]  diabetes  exposure to tuberculosis  Guillain-Barre syndrome  heart failure  hepatitis or liver disease  immune system problems  infection  lung or breathing disease, like COPD  multiple sclerosis  receiving phototherapy for the skin  seizure disorder  an unusual or allergic reaction to infliximab, mouse proteins, other medicines, foods, dyes, or preservatives  pregnant or trying to get pregnant  breast-feeding How should I use this medicine? This medicine is for injection into a vein. It is usually given by a health care professional in a hospital or clinic setting. A special MedGuide will be given to you by the pharmacist with each prescription and refill. Be sure to read this information carefully each time. Talk to your pediatrician regarding the use of this medicine in children. While this drug may be prescribed for children as young as 40 years of age for selected conditions, precautions do apply. Overdosage: If you think you have taken too much of this medicine contact a poison control center or emergency room at once. NOTE: This medicine is only for you. Do not share this medicine with others. What if I miss a dose? It is important not to miss your dose. Call your doctor or health care professional if you are unable to keep an appointment. What may interact with this  medicine? Do not take this medicine with any of the following medications:  biologic medicines such as abatacept, adalimumab, anakinra, certolizumab, etanercept, golimumab, rituximab, secukinumab, tocilizumab, tofactinib, ustekinumab  live vaccines This list may not describe all possible interactions. Give your health care provider a list of all the medicines, herbs, non-prescription drugs, or dietary supplements you use. Also tell them if you smoke, drink alcohol, or use illegal drugs. Some items may interact with your medicine. What should I watch for while using this medicine? Your condition will be monitored carefully while you are receiving this medicine. Visit your doctor or health care professional for regular checks on your progress. You may need blood work done while you are taking this medicine. Before beginning therapy, your doctor may do a test to see if you have been exposed to tuberculosis. Call your doctor or health care professional for advice if you get a fever, chills or sore throat, or other symptoms of a cold or flu. Do not treat yourself. This drug decreases your body's ability to fight infections. Try to avoid being around people who are sick. This medicine may make the symptoms of heart failure worse in some patients. If you notice symptoms such as increased shortness of breath or swelling of the ankles or legs, contact your health care provider right away. If you are going to have surgery or dental work, tell your health care professional or dentist that you have received this medicine. If you take this medicine for plaque psoriasis, stay out of the sun. If you cannot avoid being in the sun, wear protective clothing and use sunscreen. Do  not use sun lamps or tanning beds/booths. Talk to your doctor about your risk of cancer. You may be more at risk for certain types of cancers if you take this medicine. What side effects may I notice from receiving this medicine? Side effects  that you should report to your doctor or health care professional as soon as possible:  allergic reactions like skin rash, itching or hives, swelling of the face, lips, or tongue  breathing problems  changes in vision  chest pain  fever or chills, usually related to the infusion  joint pain  pain, tingling, numbness in the hands or feet  redness, blistering, peeling or loosening of the skin, including inside the mouth  seizures  signs of infection - fever or chills, cough, sore throat, flu-like symptoms, pain or difficulty passing urine  signs and symptoms of liver injury like dark yellow or brown urine; general ill feeling; light-colored stools; loss of appetite; nausea; right upper belly pain; unusually weak or tired; yellowing of the eyes or skin  signs and symptoms of a stroke like changes in vision; confusion; trouble speaking or understanding; severe headaches; sudden numbness or weakness of the face, arm or leg; trouble walking; dizziness; loss of balance or coordination  swelling of the ankles, feet, or hands  swollen lymph nodes in the neck, underarm, or groin areas  unusual bleeding or bruising  unusually weak or tired Side effects that usually do not require medical attention (report to your doctor or health care professional if they continue or are bothersome):  headache  nausea  stomach pain  upset stomach This list may not describe all possible side effects. Call your doctor for medical advice about side effects. You may report side effects to FDA at 1-800-FDA-1088. Where should I keep my medicine? This drug is given in a hospital or clinic and will not be stored at home. NOTE: This sheet is a summary. It may not cover all possible information. If you have questions about this medicine, talk to your doctor, pharmacist, or health care provider.  2020 Elsevier/Gold Standard (2016-06-13 13:45:32)

## 2019-07-06 NOTE — Telephone Encounter (Signed)
Please refer to refill encounter for Eliquis refill. Thanks.

## 2019-07-06 NOTE — Telephone Encounter (Signed)
Patient is returning call, requesting to have Eliquis 29m medication refilled. Patient has scheduled an appointment on 07/14/19. Please call to confirm.

## 2019-07-06 NOTE — Progress Notes (Signed)
Patient Care Center Note   Diagnosis:Crohn's Disease   Provider:Jacobs, Melene Plan. MD   Procedure:IV Remicade    Note:Patient received IV Remicade via PIV and tolerated well. Pre-medications taken before arrival. Vitals were monitor during course of treatment and were stable. Discharge instructions given. Patient alert, oriented and ambulatory at time of discharge.

## 2019-07-06 NOTE — Telephone Encounter (Addendum)
Eliquis 52m refill request received, pt is 64yrold, weight-74.4kg, Crea-0.73 on 01/23/2019, Diagnosis-Afib, and last seen by Dr. CaCurt Bearsn 07/11/2018-NEEDS AN APPT. Dose is appropriate based on dosing criteria. Will send in refill to requested pharmacy.   [facility], will call pt later to schedule an appt.  Called pt and she stated she knew she needed to schedule an appt, she is aware that once scheduled, I would send in refill. She was transferred to main line to make an appt.   Pt has an appt on 07/14/2019, will send in a refill.

## 2019-07-09 ENCOUNTER — Other Ambulatory Visit: Payer: Self-pay

## 2019-07-09 MED ORDER — AZATHIOPRINE 50 MG PO TABS: 150.0000 mg | ORAL_TABLET | Freq: Every day | ORAL | 3 refills | Status: DC

## 2019-07-09 MED FILL — AZATHIOPRINE 50 MG TABS: 50 | 30 days supply | Qty: 90 | Fill #0

## 2019-07-14 ENCOUNTER — Encounter: Payer: Self-pay | Admitting: Cardiology

## 2019-07-14 ENCOUNTER — Other Ambulatory Visit: Payer: Self-pay | Admitting: Cardiology

## 2019-07-14 ENCOUNTER — Ambulatory Visit: Payer: 59 | Admitting: Cardiology

## 2019-07-14 ENCOUNTER — Other Ambulatory Visit: Payer: Self-pay

## 2019-07-14 VITALS — BP 100/74 | HR 70 | Ht 63.0 in | Wt 164.0 lb

## 2019-07-14 DIAGNOSIS — I10 Essential (primary) hypertension: Secondary | ICD-10-CM | POA: Diagnosis not present

## 2019-07-14 DIAGNOSIS — I480 Paroxysmal atrial fibrillation: Secondary | ICD-10-CM

## 2019-07-14 MED ORDER — FLECAINIDE ACETATE 150 MG PO TABS: 300.0000 mg | ORAL_TABLET | Freq: Every day | ORAL | 1 refills | Status: DC | PRN

## 2019-07-14 MED ORDER — DILTIAZEM HCL ER COATED BEADS 120 MG PO CP24: 120.0000 mg | ORAL_CAPSULE | Freq: Every day | ORAL | 12 refills | Status: DC

## 2019-07-14 MED ORDER — METOPROLOL SUCCINATE ER 25 MG PO TB24: ORAL_TABLET | ORAL | 3 refills | Status: DC

## 2019-07-14 MED ORDER — APIXABAN 5 MG PO TABS: 5.0000 mg | ORAL_TABLET | Freq: Two times a day (BID) | ORAL | 11 refills | Status: DC

## 2019-07-14 MED ORDER — FLECAINIDE ACETATE 150 MG PO TABS: 300.0000 mg | ORAL_TABLET | Freq: Every day | ORAL | 3 refills | Status: DC | PRN

## 2019-07-14 MED FILL — FLECAINIDE ACETATE 150 MG T: 150 | 5 days supply | Qty: 10 | Fill #0

## 2019-07-14 NOTE — Patient Instructions (Signed)
Medication Instructions:  Your physician recommends that you continue on your current medications as directed. Please refer to the Current Medication list given to you today.  *If you need a refill on your cardiac medications before your next appointment, please call your pharmacy*  Lab Work: None ordered If you have labs (blood work) drawn today and your tests are completely normal, you will receive your results only by: Marland Kitchen MyChart Message (if you have MyChart) OR . A paper copy in the mail If you have any lab test that is abnormal or we need to change your treatment, we will call you to review the results.  Testing/Procedures: None ordered  Follow-Up: At [facility], you and your health needs are our priority.  As part of our continuing mission to provide you with exceptional heart care, we have created designated Provider Care Teams.  These Care Teams include your primary Cardiologist (physician) and Advanced Practice Providers (APPs -  Physician Assistants and Nurse Practitioners) who all work together to provide you with the care you need, when you need it.  Your next appointment:   1 year(s)  The format for your next appointment:   In Person  Provider:   Allegra Lai, MD

## 2019-07-14 NOTE — Progress Notes (Signed)
Electrophysiology Office Note   Date:  07/14/2019   ID:  NGAN Clark, DOB 02/09/1956, MRN [id number]  PCP:  Janith Lima, MD  Primary Electrophysiologist:  Constance Haw, MD    No chief complaint on file.    History of Present Illness: Julie Clark is a 64 y.o. female who presents today for electrophysiology evaluation.    She has a history of hypertension, hyperlipidemia and she has heart murmur. She presented to the emergency room in February with low blood pressure. She was found to be in atrial fibrillation. She was given beta blockers which converted to sinus rhythm.  Another episode of atrial fibrillation in August 2018.  She was put on both metoprolol and Cardizem.  He did convert to sinus rhythm without intervention.  Today, denies symptoms of palpitations, chest pain, shortness of breath, orthopnea, PND, lower extremity edema, claudication, dizziness, presyncope, syncope, bleeding, or neurologic sequela. The patient is tolerating medications without difficulties.  She is overall feeling well.  She has not had any further episodes of atrial fibrillation.  Unfortunately she is continuing to have issues with her Crohn's disease.  She has 3 setons in place.  She is hoping to avoid colectomy.  Past Medical History:  Diagnosis Date  . Anticoagulant long-term use    eliquis, managed by cardiology  . Crohn's disease of perianal region [facility])    new dx 05/ 2019 by dr Ardis Hughs ([facility])  . Heart murmur   . Hemorrhoids    internal and external  . History of hidradenitis suppurativa    axilla  . Hyperlipidemia   . Hypertension   . PAF (paroxysmal atrial fibrillation) [facility])    cardiologist-  dr Curt Bears--  first dx 07-18-2015  . Rectal pain   . Uterine fibroid    per MRI 09-16-2017  . Wears glasses   . Wears glasses    Past Surgical History:  Procedure Laterality Date  . CESAREAN SECTION  x2  last one 1996  . COLONOSCOPY WITH PROPOFOL  last one 05-14-2018  dr d.  Ardis Hughs @ [facility]  . INCISION AND DRAINAGE ABSCESS N/A 10/11/2017   Procedure: DRAINAGE OF PERIRECTAL ABSCESSES;  Surgeon: Michael Boston, MD;  Location: [place];  Service: General;  Laterality: N/A;  . [place] N/A 10/11/2017   Procedure: PLACEMENT OF SETON;  Surgeon: Michael Boston, MD;  Location: [place];  Service: General;  Laterality: N/A;  . PLACEMENT OF SETON N/A 01/23/2019   Procedure: PLACEMENT OF SETONS x4;  Surgeon: Ileana Roup, MD;  Location: [place];  Service: General;  Laterality: N/A;  . RECTAL EXAM UNDER ANESTHESIA N/A 10/11/2017   Procedure: ANORECTAL EXAM UNDER ANESTHESIA;  Surgeon: Michael Boston, MD;  Location: [place];  Service: General;  Laterality: N/A;  . RECTAL EXAM UNDER ANESTHESIA N/A 01/23/2019   Procedure: ANORECTAL EXAM UNDER ANESTHESIA,  INCISION AND DRAINAGE x2;  Surgeon: Ileana Roup, MD;  Location: [place];  Service: General;  Laterality: N/A;  . TRANSTHORACIC ECHOCARDIOGRAM  10/08/2012   ef 50-55%,  grade 1 diastolic dysfunction/  trivial MR and TR/  mild LAE     Current Outpatient Medications  Medication Sig Dispense Refill  . acetaminophen (TYLENOL) 500 MG tablet Take 500 mg by mouth every 6 (six) hours as needed.    Marland Kitchen apixaban (ELIQUIS) 5 MG TABS tablet Take 1 tablet (5 mg total) by mouth 2 (two) times daily. [place]  tablet 11  . azaTHIOprine (IMURAN) 50 MG tablet Take 3 tablets (150 mg total) by mouth daily with lunch. 90 tablet 3  . diltiazem (CARTIA XT) 120 MG 24 hr capsule Take 1 capsule (120 mg total) by mouth daily. Please keep upcoming appt in February for future refills. Thank you 30 capsule 12  . ferrous sulfate 325 (65 FE) MG tablet Take 325 mg by mouth daily with breakfast.    . flecainide (TAMBOCOR) 150 MG tablet Take 2 tablets (300 mg total) by mouth daily as needed (for Atrial Fibrillation). 10 tablet 3  . inFLIXimab (REMICADE IV)  Inject into the vein.    . metoprolol succinate (TOPROL-XL) 25 MG 24 hr tablet TAKE 1 TABLET BY MOUTH TWICE DAILY 180 tablet 3  . Polyethylene Glycol 3350 (MIRALAX PO) Take by mouth daily.    Marland Kitchen tolterodine (DETROL LA) 2 MG 24 hr capsule Take 1 capsule (2 mg total) by mouth daily. 90 capsule 1   No current facility-administered medications for this visit.   Facility-Administered Medications Ordered in Other Visits  Medication Dose Route Frequency Provider Last Rate Last Admin  . Chlorhexidine Gluconate Cloth 2 % PADS 6 each  6 each Topical Once Michael Boston, MD       And  . Chlorhexidine Gluconate Cloth 2 % PADS 6 each  6 each Topical Once Michael Boston, MD        Allergies:   Latex, Lovastatin, Protonix [pantoprazole], Adhesive [tape], and Penicillins   Social History:  The patient  reports that she has never smoked. She has never used smokeless tobacco. She reports that she does not drink alcohol or use drugs.   Family History:  The patient's family history includes Colon cancer in her maternal aunt; Hyperlipidemia in her mother; Hypertension in her mother; Prostate cancer in her father; Stroke in her mother; Thyroid disease in her mother.    ROS:  Please see the history of present illness.   Otherwise, review of systems is positive for none.   All other systems are reviewed and negative.   PHYSICAL EXAM: VS:  BP 100/74   Pulse 70   Ht 5' 3"  (1.6 m)   Wt 164 lb (74.4 kg)   SpO2 99%   BMI 29.05 kg/m  , BMI Body mass index is 29.05 kg/m. GEN: Well nourished, well developed, in no acute distress  HEENT: normal  Neck: no JVD, carotid bruits, or masses Cardiac: RRR; no murmurs, rubs, or gallops,no edema  Respiratory:  clear to auscultation bilaterally, normal work of breathing GI: soft, nontender, nondistended, + BS MS: no deformity or atrophy  Skin: warm and dry Neuro:  Strength and sensation are intact Psych: euthymic mood, full affect  EKG:  EKG is ordered today. Personal  review of the ekg ordered shows ectopic atrial rhythm, rate 63   Recent Labs: 01/23/2019: ALT 38; BUN 15; Creatinine, Ser 0.73; Potassium 3.7; Sodium 141 03/25/2019: Hemoglobin 12.1; Platelets 351.0    Lipid Panel     Component Value Date/Time   CHOL 148 03/25/2019 1357   TRIG 82.0 03/25/2019 1357   HDL 52.90 03/25/2019 1357   CHOLHDL 3 03/25/2019 1357   VLDL 16.4 03/25/2019 1357   LDLCALC 79 03/25/2019 1357     Wt Readings from Last 3 Encounters:  07/14/19 164 lb (74.4 kg)  07/06/19 164 lb (74.4 kg)  06/12/19 164 lb (74.4 kg)      Other studies Reviewed: Additional studies/ records that were reviewed today include: TTE 10/09/15  Review of the above records today demonstrates:  - Left ventricle: The cavity size was normal. Wall thickness was normal. Systolic function was normal. The estimated ejection fraction was in the range of 55% to 60%. Wall motion was normal; there were no regional wall motion abnormalities. Doppler parameters are consistent with abnormal left ventricular relaxation (grade 1 diastolic dysfunction). - Aortic valve: Mean gradient: 72m Hg (S). Peak gradient: 120mHg (S). - Left atrium: The atrium was mildly dilated. - Pulmonary arteries: PA peak pressure: 325mg (S).   ASSESSMENT AND PLAN:  1.   Paroxysmal atrial fibrillation: Currently on Eliquis with flecainide as a pill in the pocket.  Also on diltiazem.  CHA2DS2-VASc of 2.  Overall doing well.  No further episodes of atrial fibrillation.  We Dannon Nguyenthi continue medications without changes.   2. Hypertension: Currently well controlled  Current medicines are reviewed at length with the patient today.   The patient does not have concerns regarding her medicines.  The following changes were made today: None  Labs/ tests ordered today include:  Orders Placed This Encounter  Procedures  . EKG 12-Lead     Disposition:   FU with Faren Florence 12 months  Signed, [place] Freyre MarMeredith LeedsMD  07/14/2019 11:57 AM     [facility] [id number]) [phone])

## 2019-07-21 ENCOUNTER — Telehealth: Payer: Self-pay | Admitting: Gastroenterology

## 2019-07-21 NOTE — Telephone Encounter (Signed)
The patient has been notified of this information and all questions answered.

## 2019-07-21 NOTE — Telephone Encounter (Signed)
Please call Julie Clark.  Her Remicade trough level on 10 mg/kg every 6 weeks is good.  7.6 micrograms per mL.  No changes in her medical therapy for now.  Thanks

## 2019-07-24 ENCOUNTER — Ambulatory Visit
Admission: RE | Admit: 2019-07-24 | Discharge: 2019-07-24 | Disposition: A | Discharge status: Home / Self Care | Payer: 59 | Source: Ambulatory Visit | Attending: Internal Medicine | Admitting: Internal Medicine

## 2019-07-24 ENCOUNTER — Other Ambulatory Visit: Payer: Self-pay

## 2019-07-24 DIAGNOSIS — Z1231 Encounter for screening mammogram for malignant neoplasm of breast: Secondary | ICD-10-CM | POA: Diagnosis not present

## 2019-07-27 LAB — HM MAMMOGRAPHY

## 2019-07-30 MED FILL — ELIQUIS 5 MG TABLET: 5 | 30 days supply | Qty: 60 | Fill #1

## 2019-08-13 MED FILL — AZATHIOPRINE 50 MG TABS: 50 | 30 days supply | Qty: 90 | Fill #1

## 2019-08-17 ENCOUNTER — Other Ambulatory Visit: Payer: Self-pay

## 2019-08-17 ENCOUNTER — Non-Acute Institutional Stay (HOSPITAL_COMMUNITY)
Admission: RE | Admit: 2019-08-17 | Discharge: 2019-08-17 | Disposition: A | Discharge status: Home / Self Care | Payer: 59 | Source: Ambulatory Visit | Attending: Internal Medicine | Admitting: Internal Medicine

## 2019-08-17 DIAGNOSIS — K50811 Crohn's disease of both small and large intestine with rectal bleeding: Secondary | ICD-10-CM | POA: Insufficient documentation

## 2019-08-17 DIAGNOSIS — K529 Noninfective gastroenteritis and colitis, unspecified: Secondary | ICD-10-CM | POA: Diagnosis not present

## 2019-08-17 MED ORDER — ACETAMINOPHEN 325 MG PO TABS: 650.0000 mg | ORAL_TABLET | Freq: Once | ORAL | Status: DC

## 2019-08-17 MED ORDER — SODIUM CHLORIDE 0.9 % IV SOLN
INTRAVENOUS | Status: DC | PRN
  Administered 2019-08-17: 250 mL via INTRAVENOUS

## 2019-08-17 MED ORDER — SODIUM CHLORIDE 0.9 % IV SOLN
10.0000 mg/kg | INTRAVENOUS | Status: DC
  Administered 2019-08-17: 700 mg via INTRAVENOUS
  Filled 2019-08-17: qty 70

## 2019-08-17 MED ORDER — DIPHENHYDRAMINE HCL 25 MG PO CAPS: 25.0000 mg | ORAL_CAPSULE | Freq: Once | ORAL | Status: DC

## 2019-08-17 NOTE — Progress Notes (Signed)
Patient Care Center Note   Diagnosis:Crohn's Disease   Provider:Jacobs, Melene Plan. MD   Procedure:IV Remicade    Note:Patient received IV Remicade via PIV and tolerated well. Pre-medications taken before arrival. Vitals were monitor during course of treatment and were stable. Discharge instructions given. Patient alert, oriented and ambulatory at time of discharge.

## 2019-08-17 NOTE — Discharge Instructions (Signed)
Infliximab injection What is this medicine? INFLIXIMAB (in [place] i mab) is used to treat Crohn's disease and ulcerative colitis. It is also used to treat ankylosing spondylitis, plaque psoriasis, and some forms of arthritis. This medicine may be used for other purposes; ask your health care provider or pharmacist if you have questions. COMMON BRAND NAME(S): AVSOLA, INFLECTRA, Remicade, RENFLEXIS What should I tell my health care provider before I take this medicine? They need to know if you have any of these conditions:  cancer  current or past resident of [place] or [place]  diabetes  exposure to tuberculosis  Guillain-Barre syndrome  heart failure  hepatitis or liver disease  immune system problems  infection  lung or breathing disease, like COPD  multiple sclerosis  receiving phototherapy for the skin  seizure disorder  an unusual or allergic reaction to infliximab, mouse proteins, other medicines, foods, dyes, or preservatives  pregnant or trying to get pregnant  breast-feeding How should I use this medicine? This medicine is for injection into a vein. It is usually given by a health care professional in a hospital or clinic setting. A special MedGuide will be given to you by the pharmacist with each prescription and refill. Be sure to read this information carefully each time. Talk to your pediatrician regarding the use of this medicine in children. While this drug may be prescribed for children as young as 80 years of age for selected conditions, precautions do apply. Overdosage: If you think you have taken too much of this medicine contact a poison control center or emergency room at once. NOTE: This medicine is only for you. Do not share this medicine with others. What if I miss a dose? It is important not to miss your dose. Call your doctor or health care professional if you are unable to keep an appointment. What may interact with this  medicine? Do not take this medicine with any of the following medications:  biologic medicines such as abatacept, adalimumab, anakinra, certolizumab, etanercept, golimumab, rituximab, secukinumab, tocilizumab, tofactinib, ustekinumab  live vaccines This list may not describe all possible interactions. Give your health care provider a list of all the medicines, herbs, non-prescription drugs, or dietary supplements you use. Also tell them if you smoke, drink alcohol, or use illegal drugs. Some items may interact with your medicine. What should I watch for while using this medicine? Your condition will be monitored carefully while you are receiving this medicine. Visit your doctor or health care professional for regular checks on your progress. You may need blood work done while you are taking this medicine. Before beginning therapy, your doctor may do a test to see if you have been exposed to tuberculosis. Call your doctor or health care professional for advice if you get a fever, chills or sore throat, or other symptoms of a cold or flu. Do not treat yourself. This drug decreases your body's ability to fight infections. Try to avoid being around people who are sick. This medicine may make the symptoms of heart failure worse in some patients. If you notice symptoms such as increased shortness of breath or swelling of the ankles or legs, contact your health care provider right away. If you are going to have surgery or dental work, tell your health care professional or dentist that you have received this medicine. If you take this medicine for plaque psoriasis, stay out of the sun. If you cannot avoid being in the sun, wear protective clothing and use sunscreen. Do  not use sun lamps or tanning beds/booths. Talk to your doctor about your risk of cancer. You may be more at risk for certain types of cancers if you take this medicine. What side effects may I notice from receiving this medicine? Side effects  that you should report to your doctor or health care professional as soon as possible:  allergic reactions like skin rash, itching or hives, swelling of the face, lips, or tongue  breathing problems  changes in vision  chest pain  fever or chills, usually related to the infusion  joint pain  pain, tingling, numbness in the hands or feet  redness, blistering, peeling or loosening of the skin, including inside the mouth  seizures  signs of infection - fever or chills, cough, sore throat, flu-like symptoms, pain or difficulty passing urine  signs and symptoms of liver injury like dark yellow or brown urine; general ill feeling; light-colored stools; loss of appetite; nausea; right upper belly pain; unusually weak or tired; yellowing of the eyes or skin  signs and symptoms of a stroke like changes in vision; confusion; trouble speaking or understanding; severe headaches; sudden numbness or weakness of the face, arm or leg; trouble walking; dizziness; loss of balance or coordination  swelling of the ankles, feet, or hands  swollen lymph nodes in the neck, underarm, or groin areas  unusual bleeding or bruising  unusually weak or tired Side effects that usually do not require medical attention (report to your doctor or health care professional if they continue or are bothersome):  headache  nausea  stomach pain  upset stomach This list may not describe all possible side effects. Call your doctor for medical advice about side effects. You may report side effects to FDA at 1-800-FDA-1088. Where should I keep my medicine? This drug is given in a hospital or clinic and will not be stored at home. NOTE: This sheet is a summary. It may not cover all possible information. If you have questions about this medicine, talk to your doctor, pharmacist, or health care provider.  2020 Elsevier/Gold Standard (2016-06-13 13:45:32)

## 2019-08-29 MED FILL — ELIQUIS 5 MG TABLET: 5 | 30 days supply | Qty: 60 | Fill #2

## 2019-09-04 MED FILL — CARTIA XT 120 MG CP24: 120 | 30 days supply | Qty: 30 | Fill #0

## 2019-09-07 MED FILL — AZATHIOPRINE 50 MG TABS: 50 | 30 days supply | Qty: 90 | Fill #2

## 2019-09-21 MED FILL — METOPROLOL SUCCINATE ER 25: 25 | 90 days supply | Qty: 180 | Fill #0

## 2019-09-28 ENCOUNTER — Other Ambulatory Visit: Payer: Self-pay

## 2019-09-28 ENCOUNTER — Ambulatory Visit (HOSPITAL_COMMUNITY)
Admission: RE | Admit: 2019-09-28 | Discharge: 2019-09-28 | Disposition: A | Discharge status: Home / Self Care | Payer: 59 | Source: Ambulatory Visit | Attending: Gastroenterology | Admitting: Gastroenterology

## 2019-09-28 DIAGNOSIS — K50118 Crohn's disease of large intestine with other complication: Secondary | ICD-10-CM | POA: Insufficient documentation

## 2019-09-28 MED ORDER — ACETAMINOPHEN 325 MG PO TABS: 650.0000 mg | ORAL_TABLET | Freq: Once | ORAL | Status: DC

## 2019-09-28 MED ORDER — SODIUM CHLORIDE 0.9 % IV SOLN
700.0000 mg | INTRAVENOUS | Status: DC
  Administered 2019-09-28: 700 mg via INTRAVENOUS
  Filled 2019-09-28: qty 70

## 2019-09-28 MED ORDER — DIPHENHYDRAMINE HCL 25 MG PO CAPS: 25.0000 mg | ORAL_CAPSULE | Freq: Once | ORAL | Status: DC

## 2019-09-28 MED ORDER — SODIUM CHLORIDE 0.9 % IV SOLN
INTRAVENOUS | Status: DC | PRN
  Administered 2019-09-28: 250 mL via INTRAVENOUS

## 2019-09-28 MED FILL — ELIQUIS 5 MG TABLET: 5 | 30 days supply | Qty: 60 | Fill #3

## 2019-09-28 NOTE — Discharge Instructions (Signed)
Infliximab injection What is this medicine? INFLIXIMAB (in [place] i mab) is used to treat Crohn's disease and ulcerative colitis. It is also used to treat ankylosing spondylitis, plaque psoriasis, and some forms of arthritis. This medicine may be used for other purposes; ask your health care provider or pharmacist if you have questions. COMMON BRAND NAME(S): AVSOLA, INFLECTRA, Remicade, RENFLEXIS What should I tell my health care provider before I take this medicine? They need to know if you have any of these conditions:  cancer  current or past resident of [place] or [place]  diabetes  exposure to tuberculosis  Guillain-Barre syndrome  heart failure  hepatitis or liver disease  immune system problems  infection  lung or breathing disease, like COPD  multiple sclerosis  receiving phototherapy for the skin  seizure disorder  an unusual or allergic reaction to infliximab, mouse proteins, other medicines, foods, dyes, or preservatives  pregnant or trying to get pregnant  breast-feeding How should I use this medicine? This medicine is for injection into a vein. It is usually given by a health care professional in a hospital or clinic setting. A special MedGuide will be given to you by the pharmacist with each prescription and refill. Be sure to read this information carefully each time. Talk to your pediatrician regarding the use of this medicine in children. While this drug may be prescribed for children as young as 79 years of age for selected conditions, precautions do apply. Overdosage: If you think you have taken too much of this medicine contact a poison control center or emergency room at once. NOTE: This medicine is only for you. Do not share this medicine with others. What if I miss a dose? It is important not to miss your dose. Call your doctor or health care professional if you are unable to keep an appointment. What may interact with this  medicine? Do not take this medicine with any of the following medications:  biologic medicines such as abatacept, adalimumab, anakinra, certolizumab, etanercept, golimumab, rituximab, secukinumab, tocilizumab, tofactinib, ustekinumab  live vaccines This list may not describe all possible interactions. Give your health care provider a list of all the medicines, herbs, non-prescription drugs, or dietary supplements you use. Also tell them if you smoke, drink alcohol, or use illegal drugs. Some items may interact with your medicine. What should I watch for while using this medicine? Your condition will be monitored carefully while you are receiving this medicine. Visit your doctor or health care professional for regular checks on your progress. You may need blood work done while you are taking this medicine. Before beginning therapy, your doctor may do a test to see if you have been exposed to tuberculosis. Call your doctor or health care professional for advice if you get a fever, chills or sore throat, or other symptoms of a cold or flu. Do not treat yourself. This drug decreases your body's ability to fight infections. Try to avoid being around people who are sick. This medicine may make the symptoms of heart failure worse in some patients. If you notice symptoms such as increased shortness of breath or swelling of the ankles or legs, contact your health care provider right away. If you are going to have surgery or dental work, tell your health care professional or dentist that you have received this medicine. If you take this medicine for plaque psoriasis, stay out of the sun. If you cannot avoid being in the sun, wear protective clothing and use sunscreen. Do  not use sun lamps or tanning beds/booths. Talk to your doctor about your risk of cancer. You may be more at risk for certain types of cancers if you take this medicine. What side effects may I notice from receiving this medicine? Side effects  that you should report to your doctor or health care professional as soon as possible:  allergic reactions like skin rash, itching or hives, swelling of the face, lips, or tongue  breathing problems  changes in vision  chest pain  fever or chills, usually related to the infusion  joint pain  pain, tingling, numbness in the hands or feet  redness, blistering, peeling or loosening of the skin, including inside the mouth  seizures  signs of infection - fever or chills, cough, sore throat, flu-like symptoms, pain or difficulty passing urine  signs and symptoms of liver injury like dark yellow or brown urine; general ill feeling; light-colored stools; loss of appetite; nausea; right upper belly pain; unusually weak or tired; yellowing of the eyes or skin  signs and symptoms of a stroke like changes in vision; confusion; trouble speaking or understanding; severe headaches; sudden numbness or weakness of the face, arm or leg; trouble walking; dizziness; loss of balance or coordination  swelling of the ankles, feet, or hands  swollen lymph nodes in the neck, underarm, or groin areas  unusual bleeding or bruising  unusually weak or tired Side effects that usually do not require medical attention (report to your doctor or health care professional if they continue or are bothersome):  headache  nausea  stomach pain  upset stomach This list may not describe all possible side effects. Call your doctor for medical advice about side effects. You may report side effects to FDA at 1-800-FDA-1088. Where should I keep my medicine? This drug is given in a hospital or clinic and will not be stored at home. NOTE: This sheet is a summary. It may not cover all possible information. If you have questions about this medicine, talk to your doctor, pharmacist, or health care provider.  2020 Elsevier/Gold Standard (2016-06-13 13:45:32)

## 2019-09-28 NOTE — Progress Notes (Signed)
PATIENT CARE CENTER NOTE  Diagnosis: Crohn's disease   Provider: Owens Loffler MD   Procedure: IV Remicade   Note: Patient received IV Remicade. Pre-medications taken before arrival, per patient.Tolerated well, vitals stable, discharge instructions given, verbalized understanding. Patient alert, oriented and ambulatory at the time of discharge.

## 2019-10-20 ENCOUNTER — Telehealth: Payer: Self-pay | Admitting: Gastroenterology

## 2019-10-20 DIAGNOSIS — R1084 Generalized abdominal pain: Secondary | ICD-10-CM

## 2019-10-20 DIAGNOSIS — R198 Other specified symptoms and signs involving the digestive system and abdomen: Secondary | ICD-10-CM

## 2019-10-20 DIAGNOSIS — K50119 Crohn's disease of large intestine with unspecified complications: Secondary | ICD-10-CM

## 2019-10-20 MED ORDER — CIPROFLOXACIN HCL 500 MG PO TABS: 500.0000 mg | ORAL_TABLET | Freq: Two times a day (BID) | ORAL | 0 refills | Status: AC

## 2019-10-20 MED FILL — CIPROFLOXACIN HCL 500 MG TA: 500 | 10 days supply | Qty: 20 | Fill #0

## 2019-10-20 NOTE — Telephone Encounter (Signed)
I evaluated Daana in one of the office room today after she spoke with Dr. Hilarie Fredrickson and showed him her umbilicus.  It is draining again.  This is the third time she has had drainage.  It is a clear drainage.  It is bothersome and itchy to her.  It is not tender at the site.  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  I last saw her in the office about 4 months ago.  At that time she was going to get back into see Dr. Dema Severin within the next few weeks.  She tells me that she could not see him because of Covid issues and she has not heard from them since.  She is also not reached out to them to get back in.   [name], Can you please call in Cipro 500 mg p.o. twice daily for 10 days.  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  Can you also reach out to [facility] surgery and see if she is able to see Dr. Annye English in the next few weeks.  Thanks

## 2019-10-20 NOTE — Telephone Encounter (Signed)
Prescription sent  MR [facility] on 11/03/19 at 12 noon arrive at 1030 am NPO 4 hours  [facility] 10/21/19 Dr Dema Severin 9 am arrival   The patient has been notified of this information and all questions answered.

## 2019-10-21 DIAGNOSIS — K50113 Crohn's disease of large intestine with fistula: Secondary | ICD-10-CM | POA: Diagnosis not present

## 2019-10-29 MED FILL — CARTIA XT 120 MG CP24: 120 | 30 days supply | Qty: 30 | Fill #1

## 2019-10-29 MED FILL — ELIQUIS 5 MG TABLET: 5 | 30 days supply | Qty: 60 | Fill #4

## 2019-11-03 ENCOUNTER — Ambulatory Visit (HOSPITAL_COMMUNITY)
Admission: RE | Admit: 2019-11-03 | Discharge: 2019-11-03 | Disposition: A | Discharge status: Home / Self Care | Payer: 59 | Source: Ambulatory Visit | Attending: Gastroenterology | Admitting: Gastroenterology

## 2019-11-03 ENCOUNTER — Other Ambulatory Visit: Payer: Self-pay

## 2019-11-03 DIAGNOSIS — D259 Leiomyoma of uterus, unspecified: Secondary | ICD-10-CM | POA: Diagnosis not present

## 2019-11-03 DIAGNOSIS — K50119 Crohn's disease of large intestine with unspecified complications: Secondary | ICD-10-CM | POA: Insufficient documentation

## 2019-11-03 DIAGNOSIS — R198 Other specified symptoms and signs involving the digestive system and abdomen: Secondary | ICD-10-CM | POA: Diagnosis not present

## 2019-11-03 DIAGNOSIS — K5090 Crohn's disease, unspecified, without complications: Secondary | ICD-10-CM | POA: Diagnosis not present

## 2019-11-03 DIAGNOSIS — R1084 Generalized abdominal pain: Secondary | ICD-10-CM | POA: Insufficient documentation

## 2019-11-03 MED ORDER — GADOBUTROL 1 MMOL/ML IV SOLN
7.5000 mL | Freq: Once | INTRAVENOUS | Status: AC | PRN
  Administered 2019-11-03: 7.5 mL via INTRAVENOUS

## 2019-11-05 ENCOUNTER — Other Ambulatory Visit: Payer: Self-pay | Admitting: Gastroenterology

## 2019-11-06 IMAGING — MR MR PELVIS WO/W CM
4 of 7 series · 23 of 48 positions shown · IV contrast (agent unspecified)
Comparison: CT abdomen/pelvis dated 10/09/2017

CLINICAL DATA: Crohn's disease, perianal abscess

EXAM:
MRI PELVIS WITHOUT AND WITH CONTRAST
TECHNIQUE: Multiplanar multisequence MR imaging of the pelvis was performed
both before and after administration of intravenous contrast.
CONTRAST:  7 mL Gadovist IV

[Series 5: T2 · axial · 4.0mm · 0.51mm/px · z∈[-16,+165]mm · 5 of 40 slices shown]
[im 1/40]
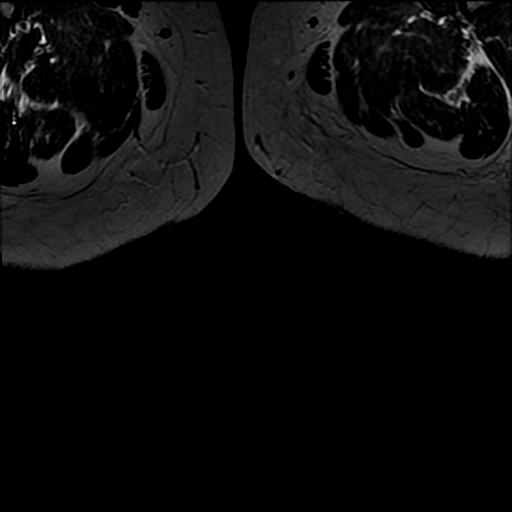
[im 10/40]
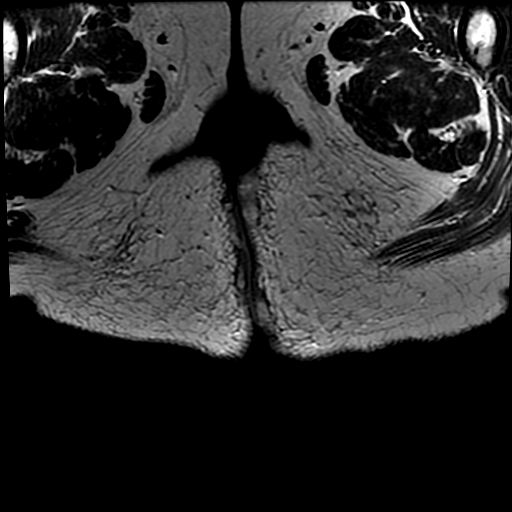
[im 20/40]
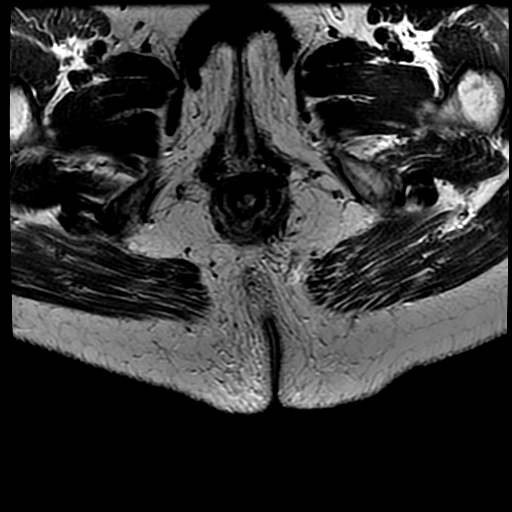
[im 30/40]
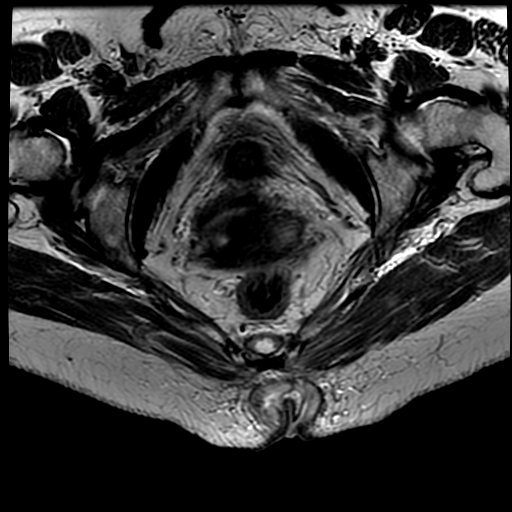
[im 40/40]
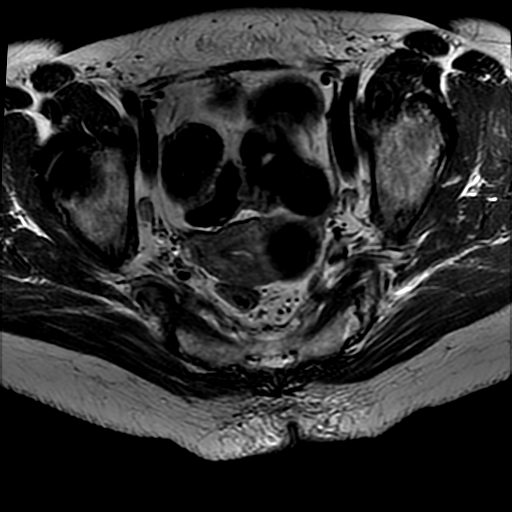

[Series 6: T2 fat-sat · axial · 4.0mm · 1.02mm/px · z∈[-16,+165]mm · 6 of 40 slices shown (1 of 2)]
[im 1/40]
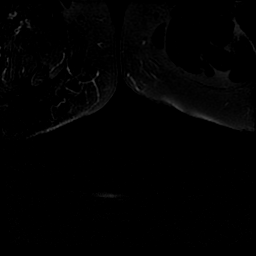
[im 8/40]
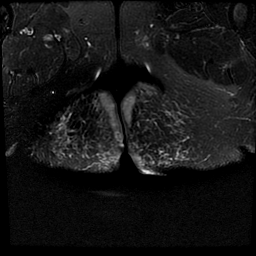
[im 16/40]
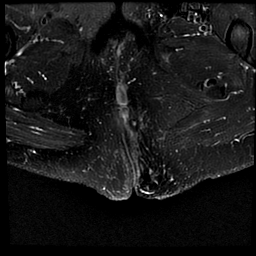
[im 24/40]
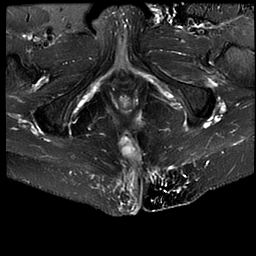
[im 32/40]
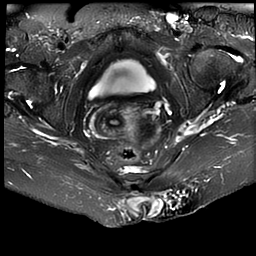
[im 40/40]
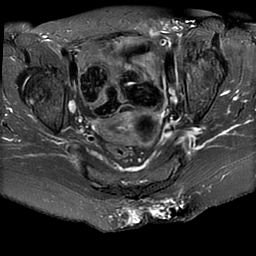

[Series 8: T1 · axial · 4.0mm · 0.51mm/px · z∈[-16,+193]mm · 7 of 46 slices shown]
[im 1/46]
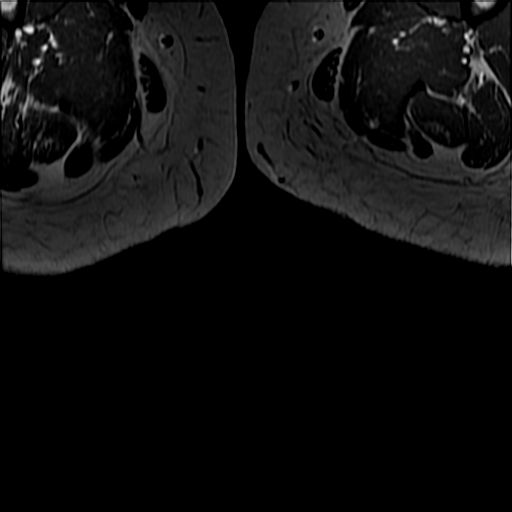
[im 8/46]
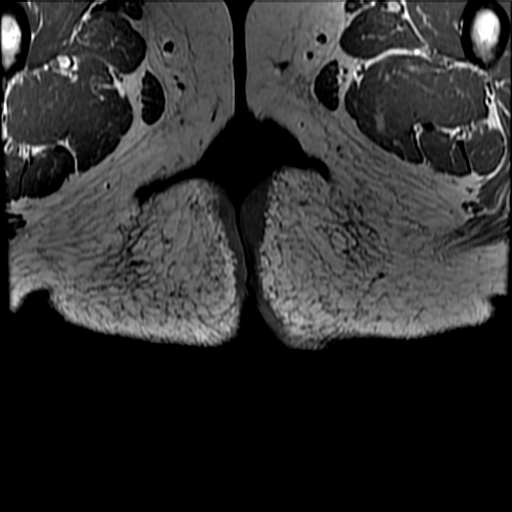
[im 16/46]
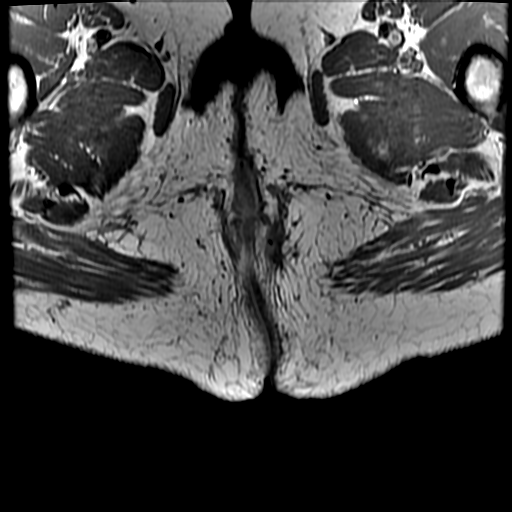
[im 23/46]
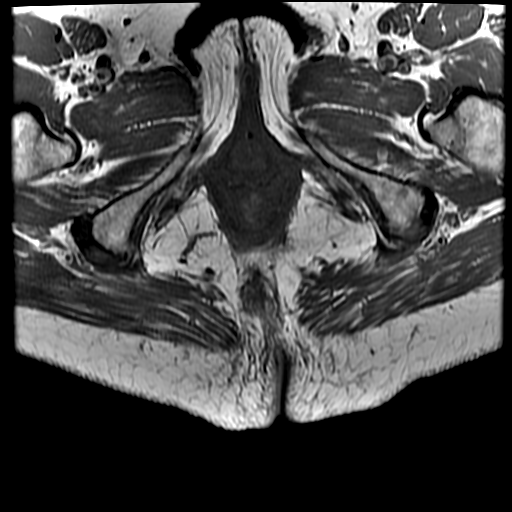
[im 31/46]
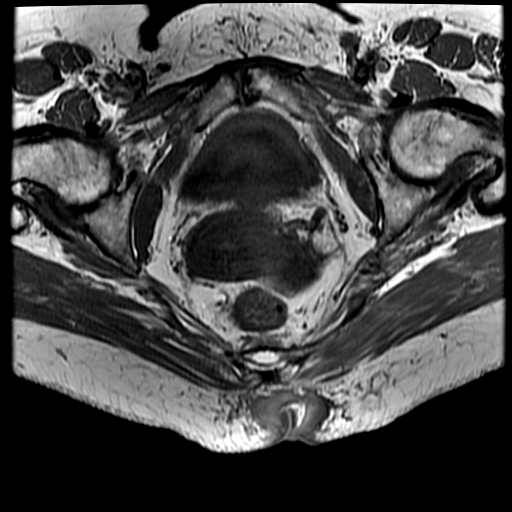
[im 38/46]
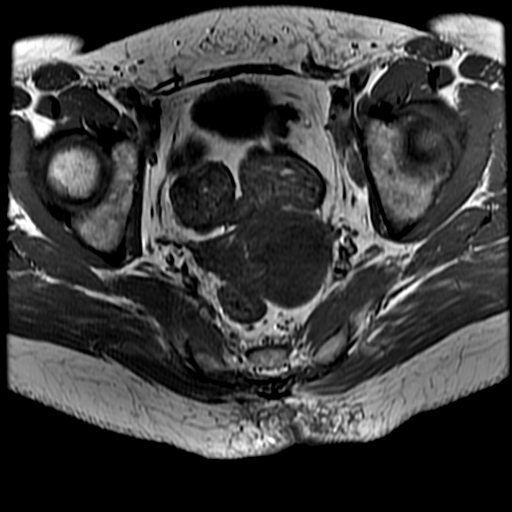
[im 46/46]
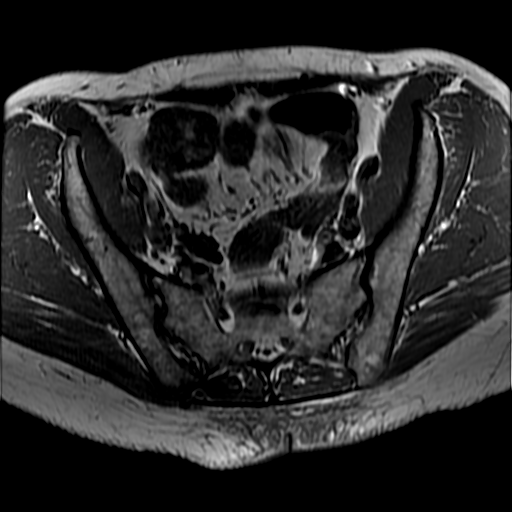

[Series 9: T2 fat-sat · coronal · 4.0mm · 0.51mm/px · 5 of 45 slices shown (2 of 2)]
[im 1/45]
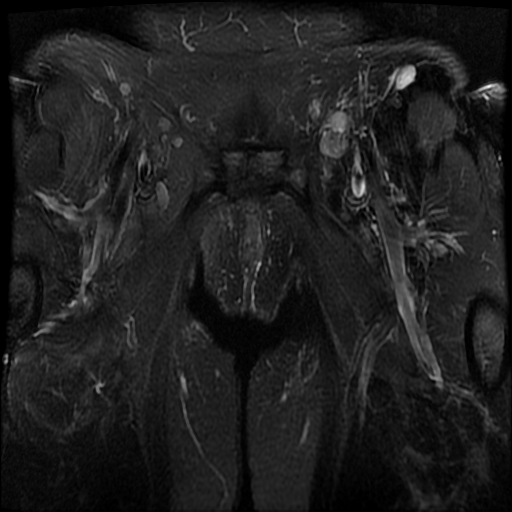
[im 8/45]
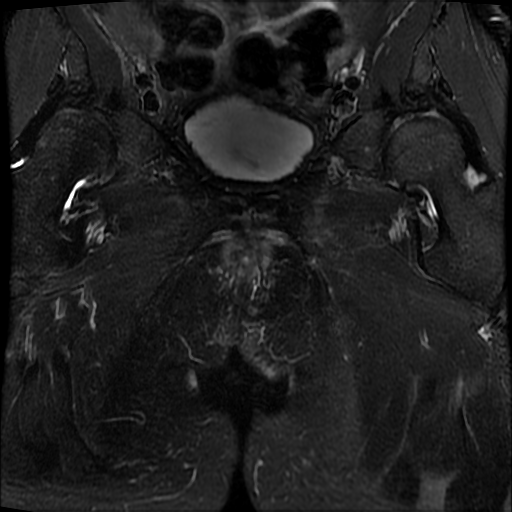
[im 15/45]
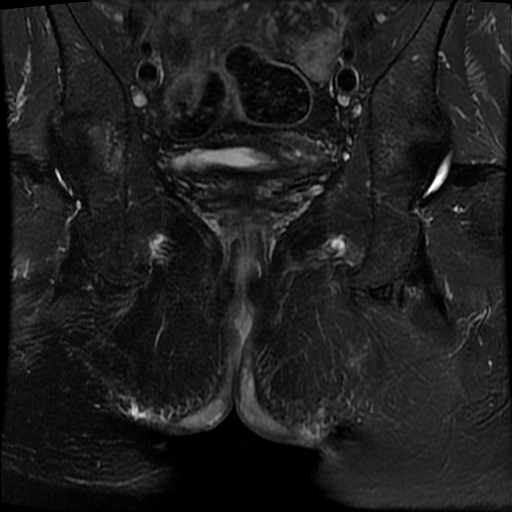
[im 23/45]
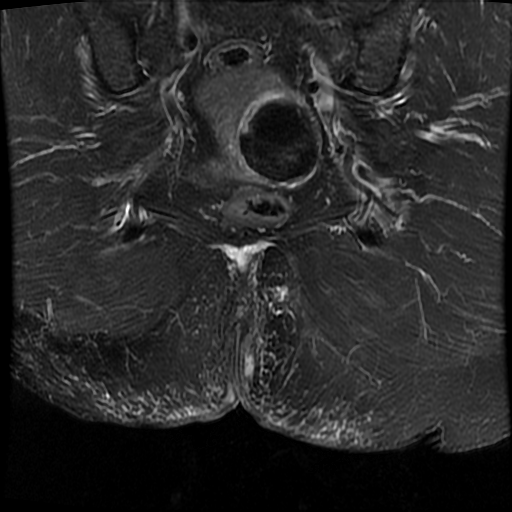
[im 37/45]
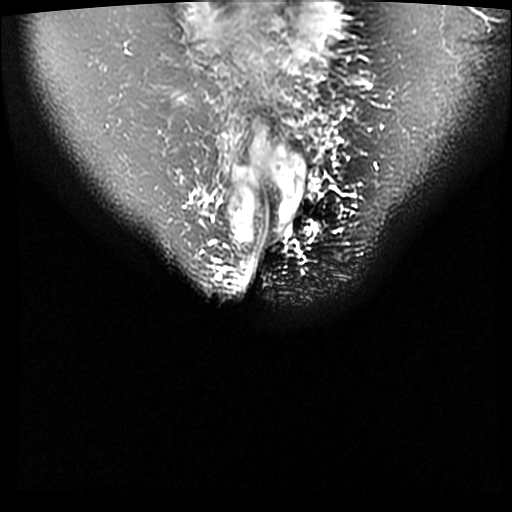

[23 of 48 positions shown; findings below may reference images not displayed]

FINDINGS: Urinary Tract:  Bladder is within normal limits.

Bowel: Bowel is notable for a transsphincteric perianal fistula at
the 6 o'clock position (series 6/image 19) extending to the left
gluteal fold (series 6/image 28). No associated abscess.

Vascular/Lymphatic: No evidence of aneurysm.

No suspicious pelvic lymphadenopathy.

Reproductive: Dominant 5.8 cm left uterine body fibroid (series
6/image 5).

No adnexal masses.

Other:  No pelvic ascites.

Musculoskeletal: No focal osseous lesions.
IMPRESSION: Transsphincteric perianal fistula at the 6 o'clock extension
extending to the left gluteal fold. No associated abscess.

## 2019-11-09 ENCOUNTER — Ambulatory Visit (HOSPITAL_COMMUNITY)
Admission: RE | Admit: 2019-11-09 | Discharge: 2019-11-09 | Disposition: A | Discharge status: Home / Self Care | Payer: 59 | Source: Ambulatory Visit | Attending: Gastroenterology | Admitting: Gastroenterology

## 2019-11-09 ENCOUNTER — Other Ambulatory Visit: Payer: Self-pay

## 2019-11-09 DIAGNOSIS — K5090 Crohn's disease, unspecified, without complications: Secondary | ICD-10-CM | POA: Diagnosis not present

## 2019-11-09 MED ORDER — SODIUM CHLORIDE 0.9 % IV SOLN
INTRAVENOUS | Status: DC | PRN
  Administered 2019-11-09: 250 mL via INTRAVENOUS

## 2019-11-09 MED ORDER — DIPHENHYDRAMINE HCL 25 MG PO CAPS: 25.0000 mg | ORAL_CAPSULE | Freq: Once | ORAL | Status: DC

## 2019-11-09 MED ORDER — SODIUM CHLORIDE 0.9 % IV SOLN
10.0000 mg/kg | Freq: Once | INTRAVENOUS | Status: AC
  Administered 2019-11-09: 800 mg via INTRAVENOUS
  Filled 2019-11-09: qty 80

## 2019-11-09 MED ORDER — ACETAMINOPHEN 325 MG PO TABS: 650.0000 mg | ORAL_TABLET | Freq: Once | ORAL | Status: DC

## 2019-11-09 NOTE — Discharge Instructions (Signed)
Infliximab injection What is this medicine? INFLIXIMAB (in [place] i mab) is used to treat Crohn's disease and ulcerative colitis. It is also used to treat ankylosing spondylitis, plaque psoriasis, and some forms of arthritis. This medicine may be used for other purposes; ask your health care provider or pharmacist if you have questions. COMMON BRAND NAME(S): AVSOLA, INFLECTRA, Remicade, RENFLEXIS What should I tell my health care provider before I take this medicine? They need to know if you have any of these conditions:  cancer  current or past resident of [place] or [place]  diabetes  exposure to tuberculosis  Guillain-Barre syndrome  heart failure  hepatitis or liver disease  immune system problems  infection  lung or breathing disease, like COPD  multiple sclerosis  receiving phototherapy for the skin  seizure disorder  an unusual or allergic reaction to infliximab, mouse proteins, other medicines, foods, dyes, or preservatives  pregnant or trying to get pregnant  breast-feeding How should I use this medicine? This medicine is for injection into a vein. It is usually given by a health care professional in a hospital or clinic setting. A special MedGuide will be given to you by the pharmacist with each prescription and refill. Be sure to read this information carefully each time. Talk to your pediatrician regarding the use of this medicine in children. While this drug may be prescribed for children as young as 64 years of age for selected conditions, precautions do apply. Overdosage: If you think you have taken too much of this medicine contact a poison control center or emergency room at once. NOTE: This medicine is only for you. Do not share this medicine with others. What if I miss a dose? It is important not to miss your dose. Call your doctor or health care professional if you are unable to keep an appointment. What may interact with this  medicine? Do not take this medicine with any of the following medications:  biologic medicines such as abatacept, adalimumab, anakinra, certolizumab, etanercept, golimumab, rituximab, secukinumab, tocilizumab, tofactinib, ustekinumab  live vaccines This list may not describe all possible interactions. Give your health care provider a list of all the medicines, herbs, non-prescription drugs, or dietary supplements you use. Also tell them if you smoke, drink alcohol, or use illegal drugs. Some items may interact with your medicine. What should I watch for while using this medicine? Your condition will be monitored carefully while you are receiving this medicine. Visit your doctor or health care professional for regular checks on your progress. You may need blood work done while you are taking this medicine. Before beginning therapy, your doctor may do a test to see if you have been exposed to tuberculosis. Call your doctor or health care professional for advice if you get a fever, chills or sore throat, or other symptoms of a cold or flu. Do not treat yourself. This drug decreases your body's ability to fight infections. Try to avoid being around people who are sick. This medicine may make the symptoms of heart failure worse in some patients. If you notice symptoms such as increased shortness of breath or swelling of the ankles or legs, contact your health care provider right away. If you are going to have surgery or dental work, tell your health care professional or dentist that you have received this medicine. If you take this medicine for plaque psoriasis, stay out of the sun. If you cannot avoid being in the sun, wear protective clothing and use sunscreen. Do  not use sun lamps or tanning beds/booths. Talk to your doctor about your risk of cancer. You may be more at risk for certain types of cancers if you take this medicine. What side effects may I notice from receiving this medicine? Side effects  that you should report to your doctor or health care professional as soon as possible:  allergic reactions like skin rash, itching or hives, swelling of the face, lips, or tongue  breathing problems  changes in vision  chest pain  fever or chills, usually related to the infusion  joint pain  pain, tingling, numbness in the hands or feet  redness, blistering, peeling or loosening of the skin, including inside the mouth  seizures  signs of infection - fever or chills, cough, sore throat, flu-like symptoms, pain or difficulty passing urine  signs and symptoms of liver injury like dark yellow or brown urine; general ill feeling; light-colored stools; loss of appetite; nausea; right upper belly pain; unusually weak or tired; yellowing of the eyes or skin  signs and symptoms of a stroke like changes in vision; confusion; trouble speaking or understanding; severe headaches; sudden numbness or weakness of the face, arm or leg; trouble walking; dizziness; loss of balance or coordination  swelling of the ankles, feet, or hands  swollen lymph nodes in the neck, underarm, or groin areas  unusual bleeding or bruising  unusually weak or tired Side effects that usually do not require medical attention (report to your doctor or health care professional if they continue or are bothersome):  headache  nausea  stomach pain  upset stomach This list may not describe all possible side effects. Call your doctor for medical advice about side effects. You may report side effects to FDA at 1-800-FDA-1088. Where should I keep my medicine? This drug is given in a hospital or clinic and will not be stored at home. NOTE: This sheet is a summary. It may not cover all possible information. If you have questions about this medicine, talk to your doctor, pharmacist, or health care provider.  2020 Elsevier/Gold Standard (2016-06-13 13:45:32)

## 2019-11-09 NOTE — Progress Notes (Signed)
Patient Care Center Note   Diagnosis:Crohn's Disease   Provider:Jacobs, Melene Plan. MD   Procedure:IV Remicade    Note:Patient received IV Remicade via PIV and tolerated well. Pre-medications taken before arrival. Vitals were monitor during course of treatment and were stable. Discharge instructions given. Patient alert, oriented and ambulatory at time of discharge.

## 2019-11-24 MED FILL — CARTIA XT 120 MG CP24: 120 | 30 days supply | Qty: 30 | Fill #2

## 2019-11-27 MED FILL — ELIQUIS 5 MG TABLET: 5 | 30 days supply | Qty: 60 | Fill #5

## 2019-12-07 MED FILL — AZATHIOPRINE 50 MG TABS: 50 | 30 days supply | Qty: 90 | Fill #1

## 2019-12-17 DIAGNOSIS — Z760 Encounter for issue of repeat prescription: Secondary | ICD-10-CM | POA: Diagnosis not present

## 2019-12-18 MED FILL — METOPROLOL SUCCINATE ER 25: 25 | 90 days supply | Qty: 180 | Fill #1

## 2019-12-21 ENCOUNTER — Other Ambulatory Visit: Payer: Self-pay

## 2019-12-21 ENCOUNTER — Ambulatory Visit (HOSPITAL_COMMUNITY)
Admission: RE | Admit: 2019-12-21 | Discharge: 2019-12-21 | Disposition: A | Discharge status: Home / Self Care | Payer: 59 | Source: Ambulatory Visit | Attending: Gastroenterology | Admitting: Gastroenterology

## 2019-12-21 DIAGNOSIS — K5090 Crohn's disease, unspecified, without complications: Secondary | ICD-10-CM | POA: Diagnosis not present

## 2019-12-21 MED ORDER — SODIUM CHLORIDE 0.9 % IV SOLN
10.0000 mg/kg | Freq: Once | INTRAVENOUS | Status: AC
  Administered 2019-12-21: 800 mg via INTRAVENOUS
  Filled 2019-12-21: qty 80

## 2019-12-21 MED ORDER — SODIUM CHLORIDE 0.9 % IV SOLN
INTRAVENOUS | Status: DC | PRN
  Administered 2019-12-21: 250 mL via INTRAVENOUS

## 2019-12-21 MED ORDER — DIPHENHYDRAMINE HCL 25 MG PO CAPS
25.0000 mg | ORAL_CAPSULE | Freq: Once | ORAL | Status: DC
  Filled 2019-12-21: qty 1

## 2019-12-21 MED ORDER — ACETAMINOPHEN 325 MG PO TABS
650.0000 mg | ORAL_TABLET | Freq: Once | ORAL | Status: DC
  Filled 2019-12-21: qty 2

## 2019-12-21 NOTE — Progress Notes (Signed)
[facility]   Diagnosis:Crohn's Disease   Provider:Jacobs, Melene Plan. MD   Procedure:IV Remicade    Note:Patient received IV Remicade via PIV and tolerated well.Pre-medications taken before arrival.Vitals were monitor during course of treatment and were stable. Discharge instructions given. Patient alert, oriented and ambulatory at time of discharge.

## 2019-12-21 NOTE — Discharge Instructions (Signed)
Infliximab injection What is this medicine? INFLIXIMAB (in [address] i mab) is used to treat Crohn's disease and ulcerative colitis. It is also used to treat ankylosing spondylitis, plaque psoriasis, and some forms of arthritis. This medicine may be used for other purposes; ask your health care provider or pharmacist if you have questions. COMMON BRAND NAME(S): AVSOLA, INFLECTRA, Remicade, RENFLEXIS What should I tell my health care provider before I take this medicine? They need to know if you have any of these conditions:  cancer  current or past resident of [place] or [place]  diabetes  exposure to tuberculosis  Guillain-Barre syndrome  heart failure  hepatitis or liver disease  immune system problems  infection  lung or breathing disease, like COPD  multiple sclerosis  receiving phototherapy for the skin  seizure disorder  an unusual or allergic reaction to infliximab, mouse proteins, other medicines, foods, dyes, or preservatives  pregnant or trying to get pregnant  breast-feeding How should I use this medicine? This medicine is for injection into a vein. It is usually given by a health care professional in a hospital or clinic setting. A special MedGuide will be given to you by the pharmacist with each prescription and refill. Be sure to read this information carefully each time. Talk to your pediatrician regarding the use of this medicine in children. While this drug may be prescribed for children as young as 71 years of age for selected conditions, precautions do apply. Overdosage: If you think you have taken too much of this medicine contact a poison control center or emergency room at once. NOTE: This medicine is only for you. Do not share this medicine with others. What if I miss a dose? It is important not to miss your dose. Call your doctor or health care professional if you are unable to keep an appointment. What may interact with this  medicine? Do not take this medicine with any of the following medications:  biologic medicines such as abatacept, adalimumab, anakinra, certolizumab, etanercept, golimumab, rituximab, secukinumab, tocilizumab, tofactinib, ustekinumab  live vaccines This list may not describe all possible interactions. Give your health care provider a list of all the medicines, herbs, non-prescription drugs, or dietary supplements you use. Also tell them if you smoke, drink alcohol, or use illegal drugs. Some items may interact with your medicine. What should I watch for while using this medicine? Your condition will be monitored carefully while you are receiving this medicine. Visit your doctor or health care professional for regular checks on your progress. You may need blood work done while you are taking this medicine. Before beginning therapy, your doctor may do a test to see if you have been exposed to tuberculosis. Call your doctor or health care professional for advice if you get a fever, chills or sore throat, or other symptoms of a cold or flu. Do not treat yourself. This drug decreases your body's ability to fight infections. Try to avoid being around people who are sick. This medicine may make the symptoms of heart failure worse in some patients. If you notice symptoms such as increased shortness of breath or swelling of the ankles or legs, contact your health care provider right away. If you are going to have surgery or dental work, tell your health care professional or dentist that you have received this medicine. If you take this medicine for plaque psoriasis, stay out of the sun. If you cannot avoid being in the sun, wear protective clothing and use sunscreen. Do  not use sun lamps or tanning beds/booths. Talk to your doctor about your risk of cancer. You may be more at risk for certain types of cancers if you take this medicine. What side effects may I notice from receiving this medicine? Side effects  that you should report to your doctor or health care professional as soon as possible:  allergic reactions like skin rash, itching or hives, swelling of the face, lips, or tongue  breathing problems  changes in vision  chest pain  fever or chills, usually related to the infusion  joint pain  pain, tingling, numbness in the hands or feet  redness, blistering, peeling or loosening of the skin, including inside the mouth  seizures  signs of infection - fever or chills, cough, sore throat, flu-like symptoms, pain or difficulty passing urine  signs and symptoms of liver injury like dark yellow or brown urine; general ill feeling; light-colored stools; loss of appetite; nausea; right upper belly pain; unusually weak or tired; yellowing of the eyes or skin  signs and symptoms of a stroke like changes in vision; confusion; trouble speaking or understanding; severe headaches; sudden numbness or weakness of the face, arm or leg; trouble walking; dizziness; loss of balance or coordination  swelling of the ankles, feet, or hands  swollen lymph nodes in the neck, underarm, or groin areas  unusual bleeding or bruising  unusually weak or tired Side effects that usually do not require medical attention (report to your doctor or health care professional if they continue or are bothersome):  headache  nausea  stomach pain  upset stomach This list may not describe all possible side effects. Call your doctor for medical advice about side effects. You may report side effects to FDA at 1-800-FDA-1088. Where should I keep my medicine? This drug is given in a hospital or clinic and will not be stored at home. NOTE: This sheet is a summary. It may not cover all possible information. If you have questions about this medicine, talk to your doctor, pharmacist, or health care provider.  2020 Elsevier/Gold Standard (2016-06-13 13:45:32)

## 2019-12-22 MED FILL — DILTIAZEM HCL ER COATED BEA: 120 | 30 days supply | Qty: 30 | Fill #3

## 2019-12-28 MED FILL — ELIQUIS 5 MG TABLET: 5 | 30 days supply | Qty: 60 | Fill #0

## 2020-01-08 ENCOUNTER — Other Ambulatory Visit: Payer: Self-pay

## 2020-01-08 ENCOUNTER — Other Ambulatory Visit: Payer: Self-pay | Admitting: Gastroenterology

## 2020-01-08 MED ORDER — AZATHIOPRINE 50 MG PO TABS: 150.0000 mg | ORAL_TABLET | Freq: Every day | ORAL | 3 refills | Status: DC

## 2020-01-08 MED FILL — AZATHIOPRINE 50 MG TABS: 50 | 30 days supply | Qty: 90 | Fill #0

## 2020-01-21 MED FILL — DILTIAZEM HCL ER COATED BEA: 120 | 30 days supply | Qty: 30 | Fill #4

## 2020-02-02 ENCOUNTER — Other Ambulatory Visit: Payer: Self-pay

## 2020-02-02 ENCOUNTER — Ambulatory Visit (HOSPITAL_COMMUNITY)
Admission: RE | Admit: 2020-02-02 | Discharge: 2020-02-02 | Disposition: A | Discharge status: Home / Self Care | Payer: 59 | Source: Ambulatory Visit | Attending: Gastroenterology | Admitting: Gastroenterology

## 2020-02-02 DIAGNOSIS — K5090 Crohn's disease, unspecified, without complications: Secondary | ICD-10-CM | POA: Diagnosis not present

## 2020-02-02 MED ORDER — SODIUM CHLORIDE 0.9 % IV SOLN
10.0000 mg/kg | INTRAVENOUS | Status: DC
  Administered 2020-02-02: 800 mg via INTRAVENOUS
  Filled 2020-02-02: qty 80

## 2020-02-02 MED ORDER — DIPHENHYDRAMINE HCL 25 MG PO CAPS: 25.0000 mg | ORAL_CAPSULE | Freq: Once | ORAL | Status: DC

## 2020-02-02 MED ORDER — SODIUM CHLORIDE 0.9 % IV SOLN
INTRAVENOUS | Status: DC | PRN
  Administered 2020-02-02: 250 mL via INTRAVENOUS

## 2020-02-02 MED ORDER — ACETAMINOPHEN 325 MG PO TABS: 650.0000 mg | ORAL_TABLET | Freq: Once | ORAL | Status: DC

## 2020-02-02 NOTE — Discharge Instructions (Signed)
Infliximab injection What is this medicine? INFLIXIMAB (in [place] i mab) is used to treat Crohn's disease and ulcerative colitis. It is also used to treat ankylosing spondylitis, plaque psoriasis, and some forms of arthritis. This medicine may be used for other purposes; ask your health care provider or pharmacist if you have questions. COMMON BRAND NAME(S): AVSOLA, INFLECTRA, Remicade, RENFLEXIS What should I tell my health care provider before I take this medicine? They need to know if you have any of these conditions:  cancer  current or past resident of [place] or [place]  diabetes  exposure to tuberculosis  Guillain-Barre syndrome  heart failure  hepatitis or liver disease  immune system problems  infection  lung or breathing disease, like COPD  multiple sclerosis  receiving phototherapy for the skin  seizure disorder  an unusual or allergic reaction to infliximab, mouse proteins, other medicines, foods, dyes, or preservatives  pregnant or trying to get pregnant  breast-feeding How should I use this medicine? This medicine is for injection into a vein. It is usually given by a health care professional in a hospital or clinic setting. A special MedGuide will be given to you by the pharmacist with each prescription and refill. Be sure to read this information carefully each time. Talk to your pediatrician regarding the use of this medicine in children. While this drug may be prescribed for children as young as 70 years of age for selected conditions, precautions do apply. Overdosage: If you think you have taken too much of this medicine contact a poison control center or emergency room at once. NOTE: This medicine is only for you. Do not share this medicine with others. What if I miss a dose? It is important not to miss your dose. Call your doctor or health care professional if you are unable to keep an appointment. What may interact with this  medicine? Do not take this medicine with any of the following medications:  biologic medicines such as abatacept, adalimumab, anakinra, certolizumab, etanercept, golimumab, rituximab, secukinumab, tocilizumab, tofactinib, ustekinumab  live vaccines This list may not describe all possible interactions. Give your health care provider a list of all the medicines, herbs, non-prescription drugs, or dietary supplements you use. Also tell them if you smoke, drink alcohol, or use illegal drugs. Some items may interact with your medicine. What should I watch for while using this medicine? Your condition will be monitored carefully while you are receiving this medicine. Visit your doctor or health care professional for regular checks on your progress. You may need blood work done while you are taking this medicine. Before beginning therapy, your doctor may do a test to see if you have been exposed to tuberculosis. Call your doctor or health care professional for advice if you get a fever, chills or sore throat, or other symptoms of a cold or flu. Do not treat yourself. This drug decreases your body's ability to fight infections. Try to avoid being around people who are sick. This medicine may make the symptoms of heart failure worse in some patients. If you notice symptoms such as increased shortness of breath or swelling of the ankles or legs, contact your health care provider right away. If you are going to have surgery or dental work, tell your health care professional or dentist that you have received this medicine. If you take this medicine for plaque psoriasis, stay out of the sun. If you cannot avoid being in the sun, wear protective clothing and use sunscreen. Do  not use sun lamps or tanning beds/booths. Talk to your doctor about your risk of cancer. You may be more at risk for certain types of cancers if you take this medicine. What side effects may I notice from receiving this medicine? Side effects  that you should report to your doctor or health care professional as soon as possible:  allergic reactions like skin rash, itching or hives, swelling of the face, lips, or tongue  breathing problems  changes in vision  chest pain  fever or chills, usually related to the infusion  joint pain  pain, tingling, numbness in the hands or feet  redness, blistering, peeling or loosening of the skin, including inside the mouth  seizures  signs of infection - fever or chills, cough, sore throat, flu-like symptoms, pain or difficulty passing urine  signs and symptoms of liver injury like dark yellow or brown urine; general ill feeling; light-colored stools; loss of appetite; nausea; right upper belly pain; unusually weak or tired; yellowing of the eyes or skin  signs and symptoms of a stroke like changes in vision; confusion; trouble speaking or understanding; severe headaches; sudden numbness or weakness of the face, arm or leg; trouble walking; dizziness; loss of balance or coordination  swelling of the ankles, feet, or hands  swollen lymph nodes in the neck, underarm, or groin areas  unusual bleeding or bruising  unusually weak or tired Side effects that usually do not require medical attention (report to your doctor or health care professional if they continue or are bothersome):  headache  nausea  stomach pain  upset stomach This list may not describe all possible side effects. Call your doctor for medical advice about side effects. You may report side effects to FDA at 1-800-FDA-1088. Where should I keep my medicine? This drug is given in a hospital or clinic and will not be stored at home. NOTE: This sheet is a summary. It may not cover all possible information. If you have questions about this medicine, talk to your doctor, pharmacist, or health care provider.  2020 Elsevier/Gold Standard (2016-06-13 13:45:32)

## 2020-02-02 NOTE — Progress Notes (Signed)
PATIENT CARE CENTER NOTE  Diagnosis: Crohn's disease   Provider: Owens Loffler MD   Procedure: IV Remicade   Note: Patient received IV Remicade. Pre-medications taken before arrival, per patient.Tolerated well, vitals stable, discharge instructions given, verbalized understanding. Patient alert, oriented and ambulatory at the time of discharge.

## 2020-02-09 MED FILL — AZATHIOPRINE 50 MG TABS: 50 | 30 days supply | Qty: 90 | Fill #1

## 2020-02-18 MED FILL — ELIQUIS 5 MG TABLET: 5 | 30 days supply | Qty: 60 | Fill #1

## 2020-02-20 MED FILL — DILTIAZEM HCL ER COATED BEA: 120 | 30 days supply | Qty: 30 | Fill #5

## 2020-03-09 MED FILL — AZATHIOPRINE 50 MG TABS: 50 | 30 days supply | Qty: 90 | Fill #2

## 2020-03-14 ENCOUNTER — Ambulatory Visit (HOSPITAL_COMMUNITY)
Admission: RE | Admit: 2020-03-14 | Discharge: 2020-03-14 | Disposition: A | Discharge status: Home / Self Care | Payer: 59 | Source: Ambulatory Visit | Attending: Gastroenterology | Admitting: Gastroenterology

## 2020-03-14 ENCOUNTER — Other Ambulatory Visit: Payer: Self-pay

## 2020-03-14 DIAGNOSIS — K5090 Crohn's disease, unspecified, without complications: Secondary | ICD-10-CM | POA: Diagnosis not present

## 2020-03-14 MED ORDER — SODIUM CHLORIDE 0.9 % IV SOLN
800.0000 mg | Freq: Once | INTRAVENOUS | Status: AC
  Administered 2020-03-14: 800 mg via INTRAVENOUS
  Filled 2020-03-14: qty 80

## 2020-03-14 MED ORDER — ACETAMINOPHEN 325 MG PO TABS: 650.0000 mg | ORAL_TABLET | Freq: Once | ORAL | Status: DC

## 2020-03-14 MED ORDER — SODIUM CHLORIDE 0.9 % IV SOLN
INTRAVENOUS | Status: DC | PRN
  Administered 2020-03-14: 250 mL via INTRAVENOUS

## 2020-03-14 MED ORDER — DIPHENHYDRAMINE HCL 25 MG PO CAPS: 25.0000 mg | ORAL_CAPSULE | Freq: Once | ORAL | Status: DC

## 2020-03-14 NOTE — Discharge Instructions (Signed)
Infliximab injection What is this medicine? INFLIXIMAB (in [place] i mab) is used to treat Crohn's disease and ulcerative colitis. It is also used to treat ankylosing spondylitis, plaque psoriasis, and some forms of arthritis. This medicine may be used for other purposes; ask your health care provider or pharmacist if you have questions. COMMON BRAND NAME(S): AVSOLA, INFLECTRA, Remicade, RENFLEXIS What should I tell my health care provider before I take this medicine? They need to know if you have any of these conditions:  cancer  current or past resident of [place] or [place]  diabetes  exposure to tuberculosis  Guillain-Barre syndrome  heart failure  hepatitis or liver disease  immune system problems  infection  lung or breathing disease, like COPD  multiple sclerosis  receiving phototherapy for the skin  seizure disorder  an unusual or allergic reaction to infliximab, mouse proteins, other medicines, foods, dyes, or preservatives  pregnant or trying to get pregnant  breast-feeding How should I use this medicine? This medicine is for injection into a vein. It is usually given by a health care professional in a hospital or clinic setting. A special MedGuide will be given to you by the pharmacist with each prescription and refill. Be sure to read this information carefully each time. Talk to your pediatrician regarding the use of this medicine in children. While this drug may be prescribed for children as young as 64 years of age for selected conditions, precautions do apply. Overdosage: If you think you have taken too much of this medicine contact a poison control center or emergency room at once. NOTE: This medicine is only for you. Do not share this medicine with others. What if I miss a dose? It is important not to miss your dose. Call your doctor or health care professional if you are unable to keep an appointment. What may interact with this  medicine? Do not take this medicine with any of the following medications:  biologic medicines such as abatacept, adalimumab, anakinra, certolizumab, etanercept, golimumab, rituximab, secukinumab, tocilizumab, tofactinib, ustekinumab  live vaccines This list may not describe all possible interactions. Give your health care provider a list of all the medicines, herbs, non-prescription drugs, or dietary supplements you use. Also tell them if you smoke, drink alcohol, or use illegal drugs. Some items may interact with your medicine. What should I watch for while using this medicine? Your condition will be monitored carefully while you are receiving this medicine. Visit your doctor or health care professional for regular checks on your progress. You may need blood work done while you are taking this medicine. Before beginning therapy, your doctor may do a test to see if you have been exposed to tuberculosis. Call your doctor or health care professional for advice if you get a fever, chills or sore throat, or other symptoms of a cold or flu. Do not treat yourself. This drug decreases your body's ability to fight infections. Try to avoid being around people who are sick. This medicine may make the symptoms of heart failure worse in some patients. If you notice symptoms such as increased shortness of breath or swelling of the ankles or legs, contact your health care provider right away. If you are going to have surgery or dental work, tell your health care professional or dentist that you have received this medicine. If you take this medicine for plaque psoriasis, stay out of the sun. If you cannot avoid being in the sun, wear protective clothing and use sunscreen. Do  not use sun lamps or tanning beds/booths. Talk to your doctor about your risk of cancer. You may be more at risk for certain types of cancers if you take this medicine. What side effects may I notice from receiving this medicine? Side effects  that you should report to your doctor or health care professional as soon as possible:  allergic reactions like skin rash, itching or hives, swelling of the face, lips, or tongue  breathing problems  changes in vision  chest pain  fever or chills, usually related to the infusion  joint pain  pain, tingling, numbness in the hands or feet  redness, blistering, peeling or loosening of the skin, including inside the mouth  seizures  signs of infection - fever or chills, cough, sore throat, flu-like symptoms, pain or difficulty passing urine  signs and symptoms of liver injury like dark yellow or brown urine; general ill feeling; light-colored stools; loss of appetite; nausea; right upper belly pain; unusually weak or tired; yellowing of the eyes or skin  signs and symptoms of a stroke like changes in vision; confusion; trouble speaking or understanding; severe headaches; sudden numbness or weakness of the face, arm or leg; trouble walking; dizziness; loss of balance or coordination  swelling of the ankles, feet, or hands  swollen lymph nodes in the neck, underarm, or groin areas  unusual bleeding or bruising  unusually weak or tired Side effects that usually do not require medical attention (report to your doctor or health care professional if they continue or are bothersome):  headache  nausea  stomach pain  upset stomach This list may not describe all possible side effects. Call your doctor for medical advice about side effects. You may report side effects to FDA at 1-800-FDA-1088. Where should I keep my medicine? This drug is given in a hospital or clinic and will not be stored at home. NOTE: This sheet is a summary. It may not cover all possible information. If you have questions about this medicine, talk to your doctor, pharmacist, or health care provider.  2020 Elsevier/Gold Standard (2016-06-13 13:45:32)

## 2020-03-14 NOTE — Progress Notes (Signed)
[facility] Note   Diagnosis:Crohn's Disease   Provider:Jacobs, Melene Plan. MD   Procedure:IV Remicade    Note:Patient received IV Remicade via PIV and tolerated well.Pre-medications taken before arrival.Infusion titrated per protocol. Vitals were monitor during course of treatment and were stable. Discharge instructions given. Patient alert, oriented and ambulatory at time of discharge.

## 2020-03-16 MED FILL — DILTIAZEM HCL ER COATED BEA: 120 | 30 days supply | Qty: 30 | Fill #6

## 2020-03-16 MED FILL — METOPROLOL SUCCINATE ER 25: 25 | 90 days supply | Qty: 180 | Fill #2

## 2020-03-16 MED FILL — ELIQUIS 5 MG TABLET: 5 | 30 days supply | Qty: 60 | Fill #2

## 2020-03-22 ENCOUNTER — Telehealth: Payer: Self-pay | Admitting: *Deleted

## 2020-03-22 NOTE — Telephone Encounter (Signed)
Patient with diagnosis of A Fib on Eliquis for anticoagulation.    Procedure: endoscopy Date of procedure: 05/06/20 [id number]}  CHA2DS2-VASc Score = 2  This indicates a 2.2% annual risk of stroke. The patient's score is based upon: CHF History: 0 HTN History: 1 Diabetes History: 0 Stroke History: 0 Vascular Disease History: 0 Age Score: 0 Gender Score: 1       CrCl 6m/min  Per office protocol, patient can hold Eliquis for 2 days prior to procedure.

## 2020-03-22 NOTE — Telephone Encounter (Signed)
Julie Clark, Julie Clark is scheduled for a colonoscopy on 05-06-20.  Her PV is 04-01-20 and she is on Eliquis.  Could you reach out to Dr. Curt Bears regarding her Eliquis hold please?  Thanks so much!!  [employer]

## 2020-03-22 NOTE — Telephone Encounter (Signed)
Pharmacy, please comment on Eliquis. Thanks!

## 2020-03-22 NOTE — Telephone Encounter (Signed)
[facility] Pre-operative Risk Assessment     Request for surgical clearance:     Endoscopy Procedure  What type of surgery is being performed?     Colon  When is this surgery scheduled?     05/06/20  What type of clearance is required ?   Pharmacy  Are there any medications that need to be held prior to surgery and how long? Eliquis  Practice name and name of physician performing surgery?      [facility]  What is your office phone and fax number?      Phone- [phone]  [phone]  Anesthesia type (None, local, MAC, general) ?       MAC

## 2020-03-22 NOTE — Telephone Encounter (Signed)
   Primary Cardiologist: Will Meredith Leeds, MD  Chart reviewed as part of pre-operative protocol coverage. Patient is scheduled for a colonoscopy on 05/06/2020 and we were asked to give our recommendations for holding Eliquis.  Per Pharmacy and office protocol, patient can hold Eliquis for 2 days prior to procedure. This should be restarted as soon as able following procedure.  I will route this recommendation to the requesting party via Epic fax function and remove from pre-op pool.  Please call with questions.  Darreld Mclean, PA-C 03/22/2020, 3:31 PM

## 2020-04-06 ENCOUNTER — Other Ambulatory Visit: Payer: Self-pay | Admitting: Gastroenterology

## 2020-04-06 ENCOUNTER — Other Ambulatory Visit: Payer: Self-pay

## 2020-04-06 ENCOUNTER — Ambulatory Visit: Payer: 59 | Admitting: *Deleted

## 2020-04-06 ENCOUNTER — Encounter: Payer: Self-pay | Admitting: Gastroenterology

## 2020-04-06 VITALS — Ht 63.0 in | Wt 172.0 lb

## 2020-04-06 DIAGNOSIS — K50119 Crohn's disease of large intestine with unspecified complications: Secondary | ICD-10-CM

## 2020-04-06 MED ORDER — SUPREP BOWEL PREP KIT 17.5-3.13-1.6 GM/177ML PO SOLN: 1.0000 | Freq: Once | ORAL | 0 refills | Status: DC

## 2020-04-06 MED FILL — SUPREP BOWEL PREP KIT: 17.5-3.13-1 | 1 days supply | Qty: 354 | Fill #0

## 2020-04-06 NOTE — Progress Notes (Signed)
covid vaccines completed  Pt's previsit is done over the phone and all paperwork (prep instructions, blank consent form to just read over, pre-procedure acknowledgement form and stamped envelope) sent to patient  Pt is aware that care partner will wait in the car during procedure; if they feel like they will be too hot or cold to wait in the car; they may wait in the 4 th floor lobby. Patient is aware to bring only one care partner. We want them to wear a mask (we do not have any that we can provide them), practice social distancing, and we will check their temperatures when they get here.  I did remind the patient that their care partner needs to stay in the parking lot the entire time and have a cell phone available, we will call them when the pt is ready for discharge. Patient will wear mask into building.   No trouble with anesthesia, difficulty with intubation or hx/fam hx of malignant hyperthermia per pt   No egg or soy allergy  No home oxygen use   No medications for weight loss taken

## 2020-04-11 MED FILL — AZATHIOPRINE 50 MG TABS: 50 | 30 days supply | Qty: 90 | Fill #3

## 2020-04-15 MED FILL — DILTIAZEM HCL ER COATED BEA: 120 | 30 days supply | Qty: 30 | Fill #7

## 2020-04-19 ENCOUNTER — Telehealth: Payer: Self-pay | Admitting: Gastroenterology

## 2020-04-19 DIAGNOSIS — K50119 Crohn's disease of large intestine with unspecified complications: Secondary | ICD-10-CM

## 2020-04-19 MED FILL — ELIQUIS 5 MG TABLET: 5 | 30 days supply | Qty: 60 | Fill #3

## 2020-04-19 NOTE — Telephone Encounter (Signed)
The pt has been advised to have labs as ordered

## 2020-04-19 NOTE — Telephone Encounter (Signed)
Tem[place]ia needs TB quant gold, can you coordinate?  thanks

## 2020-04-19 NOTE — Telephone Encounter (Signed)
Lab order entered.

## 2020-04-25 ENCOUNTER — Other Ambulatory Visit: Payer: Self-pay

## 2020-04-25 ENCOUNTER — Telehealth: Payer: Self-pay | Admitting: Gastroenterology

## 2020-04-25 ENCOUNTER — Other Ambulatory Visit: Payer: 59

## 2020-04-25 ENCOUNTER — Ambulatory Visit (HOSPITAL_COMMUNITY)
Admission: RE | Admit: 2020-04-25 | Discharge: 2020-04-25 | Disposition: A | Discharge status: Home / Self Care | Payer: 59 | Source: Ambulatory Visit | Attending: Gastroenterology | Admitting: Gastroenterology

## 2020-04-25 DIAGNOSIS — K5090 Crohn's disease, unspecified, without complications: Secondary | ICD-10-CM | POA: Diagnosis not present

## 2020-04-25 DIAGNOSIS — K50119 Crohn's disease of large intestine with unspecified complications: Secondary | ICD-10-CM

## 2020-04-25 MED ORDER — SODIUM CHLORIDE 0.9 % IV SOLN
800.0000 mg | INTRAVENOUS | Status: DC
  Administered 2020-04-25: 800 mg via INTRAVENOUS
  Filled 2020-04-25: qty 80

## 2020-04-25 MED ORDER — SODIUM CHLORIDE 0.9 % IV SOLN
INTRAVENOUS | Status: DC | PRN
  Administered 2020-04-25: 250 mL via INTRAVENOUS

## 2020-04-25 NOTE — Progress Notes (Signed)
Patient Care Center Note   Diagnosis:Crohn's Disease   Provider:Jacobs, Melene Plan. MD   Procedure:IV Remicade    Note:Patient received IV Remicade via PIV and tolerated well.Pre-medications taken before arrival.Infusion titrated per protocol. Vitals were monitor during course of treatment and were stable. AVS offered but patient declined. Patient alert, oriented and ambulatory at time of discharge.

## 2020-04-25 NOTE — Progress Notes (Signed)
Patient arrived for Remicade infusion. [facility] notified that patient needs undated order since last signed and held order if over 64 year old. New signed and held order placed for patient.

## 2020-04-25 NOTE — Telephone Encounter (Signed)
The order has been entered as requested. 10 mg/kg every 6 weeks.

## 2020-04-27 LAB — QUANTIFERON-TB GOLD PLUS
Mitogen-NIL: 7 IU/mL
NIL: 0.02 IU/mL
QuantiFERON-TB Gold Plus: NEGATIVE
TB1-NIL: 0.01 IU/mL
TB2-NIL: 0 IU/mL

## 2020-05-06 ENCOUNTER — Encounter: Payer: Self-pay | Admitting: Gastroenterology

## 2020-05-06 ENCOUNTER — Other Ambulatory Visit: Payer: Self-pay

## 2020-05-06 ENCOUNTER — Ambulatory Visit (AMBULATORY_SURGERY_CENTER): Payer: 59 | Admitting: Gastroenterology

## 2020-05-06 VITALS — BP 162/73 | HR 63 | Temp 97.3°F | Resp 18 | Ht 63.0 in | Wt 172.0 lb

## 2020-05-06 DIAGNOSIS — K5190 Ulcerative colitis, unspecified, without complications: Secondary | ICD-10-CM | POA: Diagnosis not present

## 2020-05-06 DIAGNOSIS — I10 Essential (primary) hypertension: Secondary | ICD-10-CM | POA: Diagnosis not present

## 2020-05-06 DIAGNOSIS — K50119 Crohn's disease of large intestine with unspecified complications: Secondary | ICD-10-CM

## 2020-05-06 DIAGNOSIS — K5140 Inflammatory polyps of colon without complications: Secondary | ICD-10-CM | POA: Diagnosis not present

## 2020-05-06 MED ORDER — SODIUM CHLORIDE 0.9 % IV SOLN: 500.0000 mL | Freq: Once | INTRAVENOUS | Status: DC

## 2020-05-06 NOTE — Progress Notes (Signed)
0174 Patient experiencing nausea and vomiting.  MD updated and Zofran 4 mg IV given, vss

## 2020-05-06 NOTE — Progress Notes (Signed)
Report given to PACU, vss 

## 2020-05-06 NOTE — Op Note (Addendum)
[facility] Patient Name: Julie Clark Procedure Date: 05/06/2020 9:27 AM MRN: [id number] Endoscopist: Milus Banister , MD Age: 64 Referring MD:  Date of Birth: [birth year] Gender: Female Account #: [id number] Procedure:                Colonoscopy Indications:              Crohn's disease of the small bowel and colon Medicines:                Monitored Anesthesia Care Procedure:                Pre-Anesthesia Assessment:                           - Prior to the procedure, a History and Physical                            was performed, and patient medications and                            allergies were reviewed. The patient's tolerance of                            previous anesthesia was also reviewed. The risks                            and benefits of the procedure and the sedation                            options and risks were discussed with the patient.                            All questions were answered, and informed consent                            was obtained. Prior Anticoagulants: The patient has                            taken Eliquis (apixaban), last dose was 2 days                            prior to procedure. ASA Grade Assessment: III - A                            patient with severe systemic disease. After                            reviewing the risks and benefits, the patient was                            deemed in satisfactory condition to undergo the                            procedure.  After obtaining informed consent, the colonoscope                            was passed under direct vision. Throughout the                            procedure, the patient's blood pressure, pulse, and                            oxygen saturations were monitored continuously. The                            Colonoscope was introduced through the anus and                            advanced to the the terminal ileum. The colonoscopy                             was performed without difficulty. The patient                            tolerated the procedure well. The quality of the                            bowel preparation was good. The terminal ileum,                            ileocecal valve, appendiceal orifice, and rectum                            were photographed. Scope In: 9:37:55 AM Scope Out: 9:54:49 AM Scope Withdrawal Time: 0 hours 11 minutes 58 seconds  Total Procedure Duration: 0 hours 16 minutes 54 seconds  Findings:                 Perianal disease much improved and I was                            comfortable removing her last seton this morning.                           There was a single site colon inflammation in the                            ascending colon, this was fairlyl focal, mild, the                            lumen was somewhat strictured at the site. There                            was a small inflammed nodule just proximal to this                            that I sampled with biopsies.  jar 3. I suspct this                            is the site of significant stricture, inflammation                            noted 04/2018 and it is MUCH improved overall.                           The colon mucosa was otherwise normal in right and                            left colon without obvious inflammation. Biopsies                            taken from right colon, jar 2. Biopsies taken from                            left colon, jar 4.                           The terminal ileum appeared normal. Biopsies were                            taken with a cold forceps for histology. jar 1                           Scarred distal rectal mucosa but no obvious active                            inflammation.                           The exam was otherwise without abnormality on                            direct and retroflexion views. Complications:            No immediate complications.  Estimated blood loss:                            None. Estimated Blood Loss:     Estimated blood loss: none. Impression:               - See above, much improved overall. Biopsies taken                            from multiple sites.                           - I removed the last perianal seton this morning. Recommendation:           - Patient has a contact number available for                            emergencies. The signs and  symptoms of potential                            delayed complications were discussed with the                            patient. Return to normal activities tomorrow.                            Written discharge instructions were provided to the                            patient.                           - Resume previous diet.                           - Continue present medications. OK to resume                            eliquis tomorrow.                           - Await pathology results.                           - Office visit with Dr. Ardis Hughs in 2 months; no                            changes to current therapy; infliximab 1m/kg                            every 6 weeks and azathiaprine 1548mdaily.                           - Dr. JaArdis Hughsoffice to check with insurance about                            ongoing infliximab regimen (to ensure it remains at                            the above dose) given the calendar year change                            upcoming. DaMilus BanisterMD 05/06/2020 10:07:38 AM This report has been signed electronically.

## 2020-05-06 NOTE — Patient Instructions (Addendum)
See "Recommendations" on procedure report.   Continue present medications. OK to resume Eliquis tomorrow (05-07-20).   YOU HAD AN ENDOSCOPIC PROCEDURE TODAY AT [facility]:   Refer to the procedure report that was given to you for any specific questions about what was found during the examination.  If the procedure report does not answer your questions, please call your gastroenterologist to clarify.  If you requested that your care partner not be given the details of your procedure findings, then the procedure report has been included in a sealed envelope for you to review at your convenience later.  YOU SHOULD EXPECT: Some feelings of bloating in the abdomen. Passage of more gas than usual.  Walking can help get rid of the air that was put into your GI tract during the procedure and reduce the bloating. If you had a lower endoscopy (such as a colonoscopy or flexible sigmoidoscopy) you may notice spotting of blood in your stool or on the toilet paper. If you underwent a bowel prep for your procedure, you may not have a normal bowel movement for a few days.  Please Note:  You might notice some irritation and congestion in your nose or some drainage.  This is from the oxygen used during your procedure.  There is no need for concern and it should clear up in a day or so.  SYMPTOMS TO REPORT IMMEDIATELY:   Following lower endoscopy (colonoscopy or flexible sigmoidoscopy):  Excessive amounts of blood in the stool  Significant tenderness or worsening of abdominal pains  Swelling of the abdomen that is new, acute  Fever of 100F or higher  For urgent or emergent issues, a gastroenterologist can be reached at any hour by calling [phone]. Do not use MyChart messaging for urgent concerns.    DIET:  We do recommend a small meal at first, but then you may proceed to your regular diet.  Drink plenty of fluids but you should avoid alcoholic beverages for 24 hours.  ACTIVITY:  You  should plan to take it easy for the rest of today and you should NOT DRIVE or use heavy machinery until tomorrow (because of the sedation medicines used during the test).    FOLLOW UP: Our staff will call the number listed on your records 48-72 hours following your procedure to check on you and address any questions or concerns that you may have regarding the information given to you following your procedure. If we do not reach you, we will leave a message.  We will attempt to reach you two times.  During this call, we will ask if you have developed any symptoms of COVID 19. If you develop any symptoms (ie: fever, flu-like symptoms, shortness of breath, cough etc.) before then, please call [phone].  If you test positive for Covid 19 in the 2 weeks post procedure, please call and report this information to [place].    If any biopsies were taken you will be contacted by phone or by letter within the next 1-3 weeks.  Please call us at [phone] if you have not heard about the biopsies in 3 weeks.    SIGNATURES/CONFIDENTIALITY: You and/or your care partner have signed paperwork which will be entered into your electronic medical record.  These signatures attest to the fact that that the information above on your After Visit Summary has been reviewed and is understood.  Full responsibility of the confidentiality of this discharge information lies with you and/or your care-partner.

## 2020-05-06 NOTE — Progress Notes (Signed)
VS- Julie Clark  Pt's states no medical or surgical changes since previsit or office visit.  

## 2020-05-10 ENCOUNTER — Other Ambulatory Visit: Payer: Self-pay | Admitting: Gastroenterology

## 2020-05-10 ENCOUNTER — Telehealth: Payer: Self-pay | Admitting: *Deleted

## 2020-05-10 MED FILL — AZATHIOPRINE 50 MG TABS: 50 | 30 days supply | Qty: 90 | Fill #0

## 2020-05-10 NOTE — Telephone Encounter (Signed)
  Follow up Call-  Call back number 05/06/2020 09/27/2017  Post procedure Call Back phone  # pt will be here at work Tuesday [phone]  Permission to leave phone message Yes Yes  Some recent data might be hidden     Patient questions:  Do you have a fever, pain , or abdominal swelling? No. Pain Score  0 *  Have you tolerated food without any problems? Yes.    Have you been able to return to your normal activities? Yes.    Do you have any questions about your discharge instructions: Diet   No. Medications  No. Follow up visit  No.  Do you have questions or concerns about your Care? No.  Actions: * If pain score is 4 or above: No action needed, pain <4.  1. Have you developed a fever since your procedure? no  2.   Have you had an respiratory symptoms (SOB or cough) since your procedure? no  3.   Have you tested positive for COVID 19 since your procedure no  4.   Have you had any family members/close contacts diagnosed with the COVID 19 since your procedure?  no   If yes to any of these questions please route to Joylene John, RN and Joella Prince, RN

## 2020-05-15 MED FILL — DILTIAZEM HCL ER COATED BEA: 120 | 30 days supply | Qty: 30 | Fill #8

## 2020-05-16 MED FILL — ELIQUIS 5 MG TABLET: 5 | 30 days supply | Qty: 60 | Fill #4

## 2020-05-30 ENCOUNTER — Telehealth: Payer: Self-pay

## 2020-05-30 DIAGNOSIS — K50119 Crohn's disease of large intestine with unspecified complications: Secondary | ICD-10-CM

## 2020-05-30 NOTE — Telephone Encounter (Signed)
-----   Message from Timothy Lasso, RN sent at 05/11/2020  4:32 PM EST ----- Need prior auth for remicade first of the year

## 2020-05-30 NOTE — Telephone Encounter (Signed)
Mason Jim, [place], Marthenia Rolling, RN [place],   Per Catia @ [facility]  Case# [id number] in review.  Faxed records to [phone]   Can't promise how long they will take. It may not be back in time for her next appt. This only gives a week for processing.  Last time it had to go to appeals.   I'll let you know when I hear back.  Thanks,  Amy

## 2020-05-30 NOTE — Telephone Encounter (Signed)
Amy this pt needs a prior auth for this year. I put in an amb ref.

## 2020-06-01 ENCOUNTER — Telehealth: Payer: Self-pay | Admitting: Gastroenterology

## 2020-06-01 NOTE — Telephone Encounter (Signed)
Julie Clark @ [facility] called and said that Remicade is a non-preferred drug.  They want to know if Dr. Ardis Hughs would like to change to [place] or [place] [phone]? If not, they need to have a reason why not and then they will send up to MD review. Julie Clark will probably not be back in time for her appt because of them asking to change the drug)

## 2020-06-01 NOTE — Telephone Encounter (Signed)
Inflectra is just another drug company's name for inflximab. As long as it is the exact same dose, dosing regimen then I am OK with her taking  Inflectra instead of Remicade  thanks

## 2020-06-02 NOTE — Telephone Encounter (Signed)
Noted will await response

## 2020-06-02 NOTE — Telephone Encounter (Signed)
Spoke to Journalist, newspaper at [employer].  She has sent up the ok from Dr. Ardis Hughs to change to Inflectra 2m q 6wks.  She said they will call with a determination.

## 2020-06-03 ENCOUNTER — Telehealth: Payer: Self-pay

## 2020-06-03 NOTE — Telephone Encounter (Signed)
Call placed to the pt no answer and no voice mail

## 2020-06-03 NOTE — Telephone Encounter (Signed)
I certainly will.  I do understand. Thank you

## 2020-06-03 NOTE — Telephone Encounter (Signed)
-----   Message from Darden Dates sent at 06/03/2020  3:18 PM EST ----- Regarding: RE: Infusion Hey Katrell Milhorn, I still don't have Elynn's auth back... she needs to push out her infusion appt. ----- Message ----- From: Timothy Lasso, RN Sent: 05/30/2020   2:02 PM EST To: Darden Dates Subject: RE: Infusion                                   Thank you  ----- Message ----- From: Darden Dates Sent: 05/30/2020   1:59 PM EST To: Timothy Lasso, RN Subject: Infusion                                       Delorise Jackson,  Per Catia @ [facility] Case# [id number] in review. Faxed records to [phone]  Can't promise how long they will take. It may not be back in time for her next appt.  This only gives a week for processing.   Last time it had to go to appeals.   I'll let you know when I hear back. Thanks, Amy

## 2020-06-03 NOTE — Telephone Encounter (Signed)
Julie Clark is going to call and mover her appt out a few days.  Will you keep me updated so I can make sure she gets her infusion as soon as possible.  Thank you

## 2020-06-06 ENCOUNTER — Inpatient Hospital Stay (HOSPITAL_COMMUNITY): Admission: RE | Admit: 2020-06-06 | Payer: 59 | Source: Ambulatory Visit

## 2020-06-06 ENCOUNTER — Other Ambulatory Visit: Payer: Self-pay

## 2020-06-06 ENCOUNTER — Telehealth: Payer: Self-pay

## 2020-06-06 DIAGNOSIS — K50119 Crohn's disease of large intestine with unspecified complications: Secondary | ICD-10-CM

## 2020-06-06 NOTE — Telephone Encounter (Signed)
Order entered for Inflectra 10 mg/ kg every 6 weeks.  Pt aware

## 2020-06-06 NOTE — Telephone Encounter (Signed)
-----   Message from Darden Dates sent at 06/06/2020 11:07 AM EST ----- Regarding: RE: Infusion Fax received with auth# [id number] good 06/06/20-12/04/20 for Inflectra 40m every 6 wks.  Drug code: [id number] Just make sure the hospital knows they have to give her Inflectra instead of Remicade. [place] Thanks, Amy ----- Message ----- From: PTimothy Lasso RN Sent: 05/30/2020   2:02 PM EST To: ADarden DatesSubject: RE: Infusion                                   Thank you  ----- Message ----- From: HDarden DatesSent: 05/30/2020   1:59 PM EST To: PTimothy Lasso RN Subject: Infusion                                       [name]  Per Catia @ [facility] Case# [id number] review. Faxed records to [phone] Can't promise how long they will take. It may not be back in time for her next appt.  This only gives a week for processing.   Last time it had to go to appeals.   I'll let you know when I hear back. Thanks, Amy

## 2020-06-08 ENCOUNTER — Telehealth (HOSPITAL_COMMUNITY): Payer: Self-pay

## 2020-06-08 ENCOUNTER — Telehealth: Payer: Self-pay | Admitting: Gastroenterology

## 2020-06-08 NOTE — Telephone Encounter (Signed)
Error

## 2020-06-08 NOTE — Telephone Encounter (Signed)
Spoke with Julie Clark at [facility] , requesting new orders be faxed to Julie Clark at [phone] for Inflectra infusion. Pt scheduled for infusion on 06/09/20.

## 2020-06-09 ENCOUNTER — Ambulatory Visit (HOSPITAL_COMMUNITY)
Admission: RE | Admit: 2020-06-09 | Discharge: 2020-06-09 | Disposition: A | Discharge status: Home / Self Care | Payer: 59 | Source: Ambulatory Visit | Attending: Gastroenterology | Admitting: Gastroenterology

## 2020-06-09 ENCOUNTER — Other Ambulatory Visit: Payer: Self-pay

## 2020-06-09 DIAGNOSIS — K50119 Crohn's disease of large intestine with unspecified complications: Secondary | ICD-10-CM | POA: Insufficient documentation

## 2020-06-09 MED ORDER — SODIUM CHLORIDE 0.9 % IV SOLN
10.0000 mg/kg | INTRAVENOUS | Status: DC
  Administered 2020-06-09: 800 mg via INTRAVENOUS
  Filled 2020-06-09: qty 80

## 2020-06-09 MED ORDER — SODIUM CHLORIDE 0.9 % IV SOLN
INTRAVENOUS | Status: DC | PRN
  Administered 2020-06-09: 250 mL via INTRAVENOUS

## 2020-06-09 MED FILL — AZATHIOPRINE 50 MG TABS: 50 | 30 days supply | Qty: 90 | Fill #1

## 2020-06-09 NOTE — Discharge Instructions (Signed)
Infliximab injection What is this medicine? INFLIXIMAB (in [place] i mab) is used to treat Crohn's disease and ulcerative colitis. It is also used to treat ankylosing spondylitis, plaque psoriasis, and some forms of arthritis. This medicine may be used for other purposes; ask your health care provider or pharmacist if you have questions. COMMON BRAND NAME(S): AVSOLA, INFLECTRA, IXIFI, Remicade, RENFLEXIS What should I tell my health care provider before I take this medicine? They need to know if you have any of these conditions:  cancer  current or past resident of [place] or [place]  diabetes  exposure to tuberculosis  Guillain-Barre syndrome  heart failure  hepatitis or liver disease  immune system problems  infection  lung or breathing disease, like COPD  multiple sclerosis  receiving phototherapy for the skin  seizure disorder  an unusual or allergic reaction to infliximab, mouse proteins, other medicines, foods, dyes, or preservatives  pregnant or trying to get pregnant  breast-feeding How should I use this medicine? This medicine is for injection into a vein. It is usually given by a health care professional in a hospital or clinic setting. A special MedGuide will be given to you by the pharmacist with each prescription and refill. Be sure to read this information carefully each time. Talk to your pediatrician regarding the use of this medicine in children. While this drug may be prescribed for children as young as 31 years of age for selected conditions, precautions do apply. Overdosage: If you think you have taken too much of this medicine contact a poison control center or emergency room at once. NOTE: This medicine is only for you. Do not share this medicine with others. What if I miss a dose? It is important not to miss your dose. Call your doctor or health care professional if you are unable to keep an appointment. What may interact with this  medicine? Do not take this medicine with any of the following medications:  biologic medicines such as abatacept, adalimumab, anakinra, certolizumab, etanercept, golimumab, rituximab, secukinumab, tocilizumab, tofactinib, ustekinumab  live vaccines This list may not describe all possible interactions. Give your health care provider a list of all the medicines, herbs, non-prescription drugs, or dietary supplements you use. Also tell them if you smoke, drink alcohol, or use illegal drugs. Some items may interact with your medicine. What should I watch for while using this medicine? Your condition will be monitored carefully while you are receiving this medicine. Visit your doctor or health care professional for regular checks on your progress. You may need blood work done while you are taking this medicine. Before beginning therapy, your doctor may do a test to see if you have been exposed to tuberculosis. Call your doctor or health care professional for advice if you get a fever, chills or sore throat, or other symptoms of a cold or flu. Do not treat yourself. This drug decreases your body's ability to fight infections. Try to avoid being around people who are sick. This medicine may make the symptoms of heart failure worse in some patients. If you notice symptoms such as increased shortness of breath or swelling of the ankles or legs, contact your health care provider right away. If you are going to have surgery or dental work, tell your health care professional or dentist that you have received this medicine. If you take this medicine for plaque psoriasis, stay out of the sun. If you cannot avoid being in the sun, wear protective clothing and use sunscreen.  Do not use sun lamps or tanning beds/booths. Talk to your doctor about your risk of cancer. You may be more at risk for certain types of cancers if you take this medicine. What side effects may I notice from receiving this medicine? Side effects  that you should report to your doctor or health care professional as soon as possible:  allergic reactions like skin rash, itching or hives, swelling of the face, lips, or tongue  breathing problems  changes in vision  chest pain  fever or chills, usually related to the infusion  joint pain  pain, tingling, numbness in the hands or feet  redness, blistering, peeling or loosening of the skin, including inside the mouth  seizures  signs of infection - fever or chills, cough, sore throat, flu-like symptoms, pain or difficulty passing urine  signs and symptoms of liver injury like dark yellow or brown urine; general ill feeling; light-colored stools; loss of appetite; nausea; right upper belly pain; unusually weak or tired; yellowing of the eyes or skin  signs and symptoms of a stroke like changes in vision; confusion; trouble speaking or understanding; severe headaches; sudden numbness or weakness of the face, arm or leg; trouble walking; dizziness; loss of balance or coordination  swelling of the ankles, feet, or hands  swollen lymph nodes in the neck, underarm, or groin areas  unusual bleeding or bruising  unusually weak or tired Side effects that usually do not require medical attention (report to your doctor or health care professional if they continue or are bothersome):  headache  nausea  stomach pain  upset stomach This list may not describe all possible side effects. Call your doctor for medical advice about side effects. You may report side effects to FDA at 1-800-FDA-1088. Where should I keep my medicine? This drug is given in a hospital or clinic and will not be stored at home. NOTE: This sheet is a summary. It may not cover all possible information. If you have questions about this medicine, talk to your doctor, pharmacist, or health care provider.  2021 Elsevier/Gold Standard (2016-06-13 13:45:32)

## 2020-06-09 NOTE — Progress Notes (Signed)
PATIENT CARE CENTER NOTE   Diagnosis: Crohn's Disease     Provider: Owens Loffler, MD     Procedure: Inflectra  ( Infliximab-dyyb) IV infusion     Note: Patient received Inflectra infusion ( dose #1 of 6) via PIV. Patient had taken Tylenol 1000 mg and Benadryl 82m PO prior to infusion. Explained to pt she will be receiving Inflectra today, pt previously received Remicade. Explained to pt , the drug is the same as Remicade however it is a  different drug company that makes it so the name of the drug is Inflectra. Pt verbalized understanding. Infusion titrated per protocol. Patient tolerated well with no adverse reaction. Vital signs stable. Pt declined AVS and declined to stay for 60 minute observation post infusion. Patient to come back every 6 weeks for infusion. Alert, oriented and ambulatory at discharge.

## 2020-06-14 MED FILL — DILTIAZEM HCL ER COATED BEA: 120 | 30 days supply | Qty: 30 | Fill #9

## 2020-06-17 MED FILL — ELIQUIS 5 MG TABLET: 5 | 30 days supply | Qty: 60 | Fill #5

## 2020-06-17 MED FILL — METOPROLOL SUCCINATE ER 25: 25 | 90 days supply | Qty: 180 | Fill #3

## 2020-07-06 ENCOUNTER — Other Ambulatory Visit: Payer: Self-pay | Admitting: Gastroenterology

## 2020-07-07 ENCOUNTER — Other Ambulatory Visit: Payer: Self-pay | Admitting: Gastroenterology

## 2020-07-07 MED FILL — AZATHIOPRINE 50 MG TABS: 50 | 30 days supply | Qty: 90 | Fill #0

## 2020-07-11 ENCOUNTER — Other Ambulatory Visit: Payer: Self-pay | Admitting: Internal Medicine

## 2020-07-11 DIAGNOSIS — Z1231 Encounter for screening mammogram for malignant neoplasm of breast: Secondary | ICD-10-CM

## 2020-07-13 ENCOUNTER — Ambulatory Visit: Payer: 59 | Admitting: Gastroenterology

## 2020-07-14 ENCOUNTER — Other Ambulatory Visit: Payer: Self-pay | Admitting: Cardiology

## 2020-07-14 ENCOUNTER — Encounter: Payer: Self-pay | Admitting: Gastroenterology

## 2020-07-14 ENCOUNTER — Ambulatory Visit (INDEPENDENT_AMBULATORY_CARE_PROVIDER_SITE_OTHER): Payer: 59 | Admitting: Gastroenterology

## 2020-07-14 VITALS — BP 145/87 | HR 74 | Ht 64.0 in | Wt 175.0 lb

## 2020-07-14 DIAGNOSIS — K50119 Crohn's disease of large intestine with unspecified complications: Secondary | ICD-10-CM | POA: Diagnosis not present

## 2020-07-14 DIAGNOSIS — K50114 Crohn's disease of large intestine with abscess: Secondary | ICD-10-CM | POA: Diagnosis not present

## 2020-07-14 MED FILL — DILTIAZEM HCL ER COATED BEA: 120 | 30 days supply | Qty: 30 | Fill #0

## 2020-07-14 NOTE — Patient Instructions (Signed)
If you are age 65 or younger, your body mass index should be between 19-25. Your Body mass index is 30.04 kg/m. If this is out of the aformentioned range listed, please consider follow up with your Primary Care Provider.   You have been scheduled for an appointment with _________at [facility]. Your appointment is on ________ at _______. Please arrive at _______ for registration. Make certain to bring a list of current medications, including any over the counter medications or vitamins. Also bring your co-pay if you have one as well as your insurance cards. [facility] is located at [address], [address]. Should you need to reschedule your appointment, please contact them at [phone].  You will be seen in our office in 6 months (August 2022).  We will contact you to schedule this appointment.  Thank you for entrusting me with your care and choosing [facility].  Dr Ardis Hughs

## 2020-07-14 NOTE — Progress Notes (Signed)
Review of pertinent gastrointestinal problems: 1.Crohn's disease:Crohn's colitis withsevereperianal disease.No evidence of small bowel disease on5/2019 CT enterography. Colonoscopy 09/2017 showed normal terminal ileum, patchy deep ulcers segmentally throughout her colon, severe perianal disease with fistulas and early abscess that was treated byoral Abx,debridement, seton placement 09/2017, Dr. Johney Maine.Colonoscopy 12/2012 for average risk screening; incidental inflammation right colon (path suggested possible IBD) was attributed to NSAID use at the time.  Colonoscopy December 2021 was much improved.  Her terminal ileum appeared normal.  Her perianal disease was significantly improved so much so that I removed her last seton at the time of the colonoscopy.  There was a single site of inflammation in her a sending colon that was fairly focal mild, the lumen was somewhat strictured at that site.  The colon mucosa was otherwise normal right and left colon without obvious inflammation.  The terminal ileum was normal.  Pathology showed no sign of active inflammation except for a single small "inflammatory polyp which I had removed.  Labs 09/2017: TB quant gold negative, Hepatitis B Surface Ab negative, Surface Ag negative, Hep C Ab negative; TPMT activity level NORMAL.  TB quant gold 03/2020 negative  Dual therapy with immunomodulators(azathiaprine)and biologic(humira)started 09/2017. Poor response of perianal disease led to drug level testing (01/2018) adalimumab was 5.6 (low) at trough, no antibodies detected. Humira increased to 57m every other week. Continued poor perianal response led to repeat drug testing 03/2018) adalimumab trough level was 6.8 (still lower than ideal) with no antibodies detected. 03/2018 Changed humira dosing to 461monce weekly, also increased azathiaprine to 15045mnce daily.2020 despite 40 mg once weekly Humira her drug levels were lower than acceptable. Changedto 5  mg/kg Remicade 2020.  Remicade drug levels 08/2018: suboptimal (1.3mc76mL at 5mg/8mremicade). ADA undetectable. Increased remicade to 10mg/41m10 mg/kg Remicade drug levels still low, requested10 mg/kg Remicade every 6 weekshowever this was denied by her insurance company. Formal appeals process started in late August 2020. Eventually this was OK'd by her insurance company  Exam under anesthesia8/28/2020with Dr. ChristNadeen Landausion and drainage of 2 small separate abscess cavities perianal. Also placement of 4 separate, new perianal setons.   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  She has been battling Crohn's colitis with severe perianal disease for almost 2 years now.  Her most recent colonoscopy was about 2 months ago and overall her disease was significantly improved.  She had one area in her colon that was slightly inflamed and a bit narrowed but this was much better than the previous colonoscopy a year or so prior.  Her perianal disease was also much much improved.  So much so that I felt comfortable removing the last seton.  She feels fine from a bowel perspective, no constipation, no diarrhea, no bleeding and no abdominal pains.  She still has a site on her right buttocks which is the site from one of the setons that was removed last summer the periodically will swell and drain mainly blood.  ROS: complete GI ROS as described in HPI, all other review negative.  Constitutional:  No unintentional weight loss   Past Medical History:  Diagnosis Date  . Anticoagulant long-term use    eliquis, managed by cardiology  . [facility]ianal region [facility]  . Heart murmur   . Hemorrhoids    internal and external  . History of hidradenitis suppurativa  axilla  . Hyperlipidemia   . Hypertension   . PAF (paroxysmal atrial fibrillation) [facility])    cardiologist-  dr  Curt Bears--  first dx 07-18-2015  . Rectal pain   . Uterine fibroid    per MRI 09-16-2017  . Wears glasses   . Wears glasses     Past Surgical History:  Procedure Laterality Date  . CESAREAN SECTION  x2  last one 1996  . COLONOSCOPY    . COLONOSCOPY WITH PROPOFOL  last one 05-14-2018  dr d. Ardis Hughs @ [facility]  . INCISION AND DRAINAGE ABSCESS N/A 10/11/2017   Procedure: DRAINAGE OF PERIRECTAL ABSCESSES;  Surgeon: Michael Boston, MD;  Location: [place];  Service: General;  Laterality: N/A;  . [place] N/A 10/11/2017   Procedure: PLACEMENT OF SETON;  Surgeon: Michael Boston, MD;  Location: [place];  Service: General;  Laterality: N/A;  . PLACEMENT OF SETON N/A 01/23/2019   Procedure: PLACEMENT OF SETONS x4;  Surgeon: Ileana Roup, MD;  Location: [place];  Service: General;  Laterality: N/A;  . RECTAL EXAM UNDER ANESTHESIA N/A 10/11/2017   Procedure: ANORECTAL EXAM UNDER ANESTHESIA;  Surgeon: Michael Boston, MD;  Location: [place];  Service: General;  Laterality: N/A;  . RECTAL EXAM UNDER ANESTHESIA N/A 01/23/2019   Procedure: ANORECTAL EXAM UNDER ANESTHESIA,  INCISION AND DRAINAGE x2;  Surgeon: Ileana Roup, MD;  Location: [place];  Service: General;  Laterality: N/A;  . TRANSTHORACIC ECHOCARDIOGRAM  10/08/2012   ef 50-55%,  grade 1 diastolic dysfunction/  trivial MR and TR/  mild LAE    Current Outpatient Medications  Medication Sig Dispense Refill  . acetaminophen (TYLENOL) 500 MG tablet Take 500 mg by mouth every 6 (six) hours as needed.    Marland Kitchen apixaban (ELIQUIS) 5 MG TABS tablet Take 1 tablet (5 mg total) by mouth 2 (two) times daily. 60 tablet 11  . azaTHIOprine (IMURAN) 50 MG tablet Take 3 tablets by mouth once a day with lunch 90 tablet 1  . diltiazem (CARDIZEM CD) 120 MG 24 hr capsule Take 1 capsule (120 mg total) by mouth daily. Please make overdue appt with Dr. Curt Bears  before anymore refills. Thank you 1st attempt 30 capsule 0  . ferrous sulfate 325 (65 FE) MG tablet Take 325 mg by mouth daily with breakfast.    . flecainide (TAMBOCOR) 150 MG tablet Take 2 tablets (300 mg total) by mouth daily as needed (for Atrial Fibrillation). 10 tablet 3  . inFLIXimab (REMICADE IV) Inject into the vein. Every 6 weeks    . metoprolol succinate (TOPROL-XL) 25 MG 24 hr tablet TAKE 1 TABLET BY MOUTH TWICE DAILY 180 tablet 3  . Polyethylene Glycol 3350 (MIRALAX PO) Take by mouth daily.    Marland Kitchen tolterodine (DETROL LA) 2 MG 24 hr capsule Take 1 capsule (2 mg total) by mouth daily. 90 capsule 1   No current facility-administered medications for this visit.   Facility-Administered Medications Ordered in Other Visits  Medication Dose Route Frequency Provider Last Rate Last Admin  . Chlorhexidine Gluconate Cloth 2 % PADS 6 each  6 each Topical Once Michael Boston, MD       And  . Chlorhexidine Gluconate Cloth 2 % PADS 6 each  6 each Topical Once Michael Boston, MD        Allergies as of 07/14/2020 - Review Complete 07/14/2020  Allergen Reaction Noted  . Latex Rash 10/10/2017  .  Lovastatin  08/11/2012  . Protonix [pantoprazole] Diarrhea 10/10/2017  . Adhesive [tape] Rash 10/10/2017  . Penicillins Rash 08/11/2012    Family History  Problem Relation Age of Onset  . Stroke Mother   . Hypertension Mother   . Hyperlipidemia Mother   . Thyroid disease Mother   . Prostate cancer Father   . Colon cancer Maternal Aunt   . Diabetes Neg Hx   . Depression Neg Hx   . Early death Neg Hx   . Hearing loss Neg Hx   . Heart disease Neg Hx   . Kidney disease Neg Hx   . Alcohol abuse Neg Hx   . Breast cancer Neg Hx   . Esophageal cancer Neg Hx   . Stomach cancer Neg Hx   . Rectal cancer Neg Hx     Social History   Socioeconomic History  . Marital status: Married    Spouse name: Not on file  . Number of children: Not on file  . Years of education: Not on file  . Highest  education level: Not on file  Occupational History  . Occupation: registered Optician, dispensing: [employer]  Tobacco Use  . Smoking status: Never Smoker  . Smokeless tobacco: Never Used  Vaping Use  . Vaping Use: Never used  Substance and Sexual Activity  . Alcohol use: No  . Drug use: No  . Sexual activity: Not on file  Other Topics Concern  . Not on file  Social History Narrative   Married to the love of her life, Public affairs consultant   Social Determinants of Health   Financial Resource Strain: Not on file  Food Insecurity: Not on file  Transportation Needs: Not on file  Physical Activity: Not on file  Stress: Not on file  Social Connections: Not on file  Intimate Partner Violence: Not on file     Physical Exam: BP (!) 145/87   Pulse 74   Ht 5' 4"  (1.626 m)   Wt 175 lb (79.4 kg)   BMI 30.04 kg/m  Constitutional: generally well-appearing Psychiatric: alert and oriented x3 Abdomen: soft, nontender, nondistended, no obvious ascites, no peritoneal signs, normal bowel sounds No peripheral edema noted in lower extremities Rectal examination with female assistant in the room: Overall much much improved perianal disease compared to 2 years ago.  Sites of her previous setons all looked much improved, healed except for one site on her right buttocks about 6 cm from the anus.  This site was reddish, not fluctuant but it was definitely a bit tender.  Assessment and plan: 65 y.o. female with Crohn's colitis with perianal disease  Her current medical regimen is clearly helping, azathioprine 150 mg once daily and Remicade 10 mg/kg every 6 weeks.  Her mucosal disease is much improved.  She is still bothered by the right buttocks at site where she had a previous seton removed several months ago.  Site is reddish, a bit excoriated, tender.  It does not seem to be an abscess and it periodically will open and drain mainly blood.  I think this might need either a new seton placed or perhaps it needs to  be "unroofed".  Either way we will work to get her back in to see Dr. Annye English at [facility] for his input and expertise.  We will also put a reminder in our system for return office visit in 6 months and obviously sooner if any problems.  Please see the "Patient Instructions" section  for addition details about the plan.  Owens Loffler, MD [facility] 07/14/2020, 4:11 PM   Total time on date of encounter was 36 minutes (this included time spent preparing to see the patient reviewing records; obtaining and/or reviewing separately obtained history; performing a medically appropriate exam and/or evaluation; counseling and educating the patient and family if present; ordering medications, tests or procedures if applicable; and documenting clinical information in the health record).

## 2020-07-15 ENCOUNTER — Other Ambulatory Visit: Payer: Self-pay | Admitting: *Deleted

## 2020-07-15 ENCOUNTER — Other Ambulatory Visit: Payer: Self-pay | Admitting: Cardiology

## 2020-07-15 MED ORDER — DILTIAZEM HCL ER COATED BEADS 120 MG PO CP24: 120.0000 mg | ORAL_CAPSULE | Freq: Every day | ORAL | 0 refills | Status: DC

## 2020-07-18 ENCOUNTER — Encounter (HOSPITAL_COMMUNITY): Payer: 59

## 2020-07-22 ENCOUNTER — Other Ambulatory Visit: Payer: Self-pay | Admitting: Cardiology

## 2020-07-22 ENCOUNTER — Other Ambulatory Visit: Payer: Self-pay | Admitting: Physician Assistant

## 2020-07-22 MED FILL — ELIQUIS 5 MG TABLET: 5 | 30 days supply | Qty: 60 | Fill #0

## 2020-07-22 NOTE — Telephone Encounter (Signed)
Pt has not had labs checked since 2020 per Epic/[facility], no labs at all in [place]. Called PCP office Dr Ronnald Ramp, they have not checked labs within the last year either.  Called pt, cell # and home # not valid.

## 2020-07-22 NOTE — Telephone Encounter (Signed)
*  STAT* If patient is at the pharmacy, call can be transferred to refill team.   1. Which medications need to be refilled? (please list name of each medication and dose if known) Eliquis  2. Which pharmacy/location (including street and city if local pharmacy) is medication to be sent to? [facility]  3. Do they need a 30 day or 90 day supply?30 days and refills pt have an appt on 08-16-20

## 2020-07-25 ENCOUNTER — Non-Acute Institutional Stay (HOSPITAL_COMMUNITY)
Admission: RE | Admit: 2020-07-25 | Discharge: 2020-07-25 | Disposition: A | Discharge status: Home / Self Care | Payer: 59 | Source: Ambulatory Visit | Attending: Internal Medicine | Admitting: Internal Medicine

## 2020-07-25 ENCOUNTER — Other Ambulatory Visit: Payer: Self-pay

## 2020-07-25 DIAGNOSIS — K50119 Crohn's disease of large intestine with unspecified complications: Secondary | ICD-10-CM | POA: Diagnosis not present

## 2020-07-25 MED ORDER — SODIUM CHLORIDE 0.9 % IV SOLN
10.0000 mg/kg | Freq: Once | INTRAVENOUS | Status: AC
  Administered 2020-07-25: 800 mg via INTRAVENOUS
  Filled 2020-07-25: qty 80

## 2020-07-25 MED ORDER — SODIUM CHLORIDE 0.9 % IV SOLN
INTRAVENOUS | Status: DC | PRN
  Administered 2020-07-25: 250 mL via INTRAVENOUS

## 2020-07-25 NOTE — Progress Notes (Signed)
PATIENT CARE CENTER NOTE  Diagnosis:Crohn's Disease   Provider:Daniel Ardis Hughs, MD   Procedure:Inflectra  ( Infliximab-dyyb) IV infusion   Note:Patient received Inflectra infusion ( dose #2 of 6) via PIV. Patient had taken pre-medications prior to arrival. Patient tolerated well with no adverse reaction. Vital signs stable. Pt declined AVS and declined to stay for 60 minute observation post infusion.Patient to come back every 6 weeks for infusion. Alert, oriented and ambulatory at discharge.

## 2020-07-28 ENCOUNTER — Other Ambulatory Visit: Payer: Self-pay

## 2020-07-28 ENCOUNTER — Telehealth: Payer: Self-pay | Admitting: Gastroenterology

## 2020-07-28 DIAGNOSIS — K50119 Crohn's disease of large intestine with unspecified complications: Secondary | ICD-10-CM

## 2020-07-28 MED ORDER — SODIUM CHLORIDE 0.9 % IV SOLN: 10.0000 mg/kg | INTRAVENOUS | Status: DC

## 2020-07-28 NOTE — Telephone Encounter (Signed)
Inflectra order has been entered for 10 mg/kg every 6 weeks.  Pt care center advised

## 2020-07-28 NOTE — Telephone Encounter (Signed)
Julie Clark from Pt care ctr called requesting Script for Inflectra faxed over. She stated that they do not have the option to enter prescriptions in Epic so the pharmacy do it for them when pt goes there to ger her infusion. Any questions pls call her at [phone].

## 2020-08-13 NOTE — Progress Notes (Signed)
Cardiology Office Note Date:  08/13/2020  Patient ID:  Julie Clark, DOB 1956-05-23, MRN [id number] PCP:  Janith Lima, MD  Electrophysiologist: Dr. Curt Bears    Chief Complaint: annual visit  History of Present Illness: Julie Clark is a 65 y.o. female with history of HTN, HLD, chron's disease, AFib  She comes today to be seen for Dr. Curt Bears, last seen by him  Feb 2021, at that time, no AFib, unfortunately struggling with her Chron's. Utilizing flecainide pill in the pocket strategy.  No changes were made.  TODAY She is ding very well from a cardiac perspective Her Chron's has really consumed her life unfortunately. No CP, palpitations, she has never used the PRN flecainide No dizzy spells, near syncope or syncope. 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 No bleeding  PMD does her labs, she will be seen there soon  Afib hx Diagnosed 06/2015 Initially treated with BB, CCB  AAD Flecainide pill-in-pocket strategy started 2018   Past Medical History:  Diagnosis Date   Anticoagulant long-term use    eliquis, managed by cardiology   Crohn's disease of perianal region [facility])    new dx 05/ 2019 by dr Ardis Hughs ([facility])   Heart murmur    Hemorrhoids    internal and external   History of hidradenitis suppurativa    axilla   Hyperlipidemia    Hypertension    PAF (paroxysmal atrial fibrillation) [facility])    cardiologist-  dr Curt Bears--  first dx 07-18-2015   Rectal pain    Uterine fibroid    per MRI 09-16-2017   Wears glasses    Wears glasses     Past Surgical History:  Procedure Laterality Date   CESAREAN SECTION  x2  last one [place]  last one 05-14-2018  dr d. Ardis Hughs @ [facility]   INCISION AND DRAINAGE ABSCESS N/A 10/11/2017   Procedure: DRAINAGE OF PERIRECTAL ABSCESSES;  Surgeon: Michael Boston, MD;  Location: [place];  Service: General;  Laterality: N/A;   [place] N/A 10/11/2017   Procedure: PLACEMENT OF SETON;  Surgeon: Michael Boston, MD;  Location: [place];  Service: General;  Laterality: N/A;   PLACEMENT OF SETON N/A 01/23/2019   Procedure: PLACEMENT OF SETONS x4;  Surgeon: Ileana Roup, MD;  Location: [place];  Service: General;  Laterality: N/A;   RECTAL EXAM UNDER ANESTHESIA N/A 10/11/2017   Procedure: ANORECTAL EXAM UNDER ANESTHESIA;  Surgeon: Michael Boston, MD;  Location: [place];  Service: General;  Laterality: N/A;   RECTAL EXAM UNDER ANESTHESIA N/A 01/23/2019   Procedure: ANORECTAL EXAM UNDER ANESTHESIA,  INCISION AND DRAINAGE x2;  Surgeon: Ileana Roup, MD;  Location: [place];  Service: General;  Laterality: N/A;   TRANSTHORACIC ECHOCARDIOGRAM  10/08/2012   ef 50-55%,  grade 1 diastolic dysfunction/  trivial MR and TR/  mild LAE    Current Outpatient Medications  Medication Sig Dispense Refill   acetaminophen (TYLENOL) 500 MG tablet Take 500 mg by mouth every 6 (six) hours as needed.     azaTHIOprine (IMURAN) 50 MG tablet Take 3 tablets by mouth once a day with lunch 90  tablet 1   diltiazem (CARDIZEM CD) 120 MG 24 hr capsule Take 1 capsule (120 mg total) by mouth daily. Please make overdue appt with Dr. Curt Bears before anymore refills. Thank you 2nd attempt 15 capsule 0   ELIQUIS 5 MG TABS tablet TAKE 1 TABLET BY MOUTH TWO TIMES DAILY 60 tablet 0   ferrous sulfate 325 (65 FE) MG tablet Take 325 mg by mouth daily with breakfast.     flecainide (TAMBOCOR) 150 MG tablet Take 2 tablets (300 mg total) by mouth daily as needed (for Atrial Fibrillation). 10 tablet 3   inFLIXimab (REMICADE IV) Inject into the vein. Every 6 weeks      metoprolol succinate (TOPROL-XL) 25 MG 24 hr tablet TAKE 1 TABLET BY MOUTH TWICE DAILY 180 tablet 3   Polyethylene Glycol 3350 (MIRALAX PO) Take by mouth daily.     tolterodine (DETROL LA) 2 MG 24 hr capsule Take 1 capsule (2 mg total) by mouth daily. 90 capsule 1   Current Facility-Administered Medications  Medication Dose Route Frequency Provider Last Rate Last Admin   inFLIXimab (REMICADE) 10 mg/kg = 800 mg in sodium chloride 0.9 % 250 mL infusion  10 mg/kg Intravenous Q6 weeks Milus Banister, MD       Facility-Administered Medications Ordered in Other Visits  Medication Dose Route Frequency Provider Last Rate Last Admin   Chlorhexidine Gluconate Cloth 2 % PADS 6 each  6 each Topical Once Michael Boston, MD       And   Chlorhexidine Gluconate Cloth 2 % PADS 6 each  6 each Topical Once Michael Boston, MD        Allergies:   Latex, Lovastatin, Protonix [pantoprazole], Adhesive [tape], and Penicillins   Social History:  The patient  reports that she has never smoked. She has never used smokeless tobacco. She reports that she does not drink alcohol and does not use drugs.   Family History:  The patient's family history includes Colon cancer in her maternal aunt; Hyperlipidemia in her mother; Hypertension in her mother; Prostate cancer in her father; Stroke in her mother; Thyroid disease in her mother.  ROS:  Please see the history of present illness.    All other systems are reviewed and otherwise negative.   PHYSICAL EXAM:  VS:  There were no vitals taken for this visit. BMI: There is no height or weight on file to calculate BMI. Well nourished, well developed, in no acute distress HEENT: normocephalic, atraumatic Neck: no JVD, carotid bruits or masses Cardiac:   RRR; no significant murmurs, no rubs, or gallops Lungs:  CTA b/l, no wheezing, rhonchi or rales Abd: soft, nontender MS: no deformity or atrophy Ext: no edema Skin: warm and dry, no rash Neuro:  No gross deficits  appreciated Psych: euthymic mood, full affect    EKG:  Done today and reviewed by myself shows  SR 67bpm, no St/T changes  TTE 10/09/15  Review of the above records today demonstrates:  - Left ventricle: The cavity size was normal. Wall thickness was normal. Systolic function was normal. The estimated ejection fraction was in the range of 55% to 60%. Wall motion was normal; there were no regional wall motion abnormalities. Doppler parameters are consistent with abnormal left ventricular relaxation (grade 1 diastolic dysfunction). - Aortic valve: Mean gradient: 33m Hg (S). Peak gradient: 158mHg (S). - Left atrium: The atrium was mildly dilated. - Pulmonary arteries: PA peak pressure: 3252mg (S).  Recent Labs: No results found for requested  labs within last 8760 hours.  No results found for requested labs within last 8760 hours.   CrCl cannot be calculated (Patient's most recent lab result is older than the maximum 21 days allowed.).   Wt Readings from Last 3 Encounters:  07/25/20 174 lb 13.2 oz (79.3 kg)  07/14/20 175 lb (79.4 kg)  06/09/20 178 lb (80.7 kg)     Other studies reviewed: Additional studies/records reviewed today include: summarized above  ASSESSMENT AND PLAN:  1. Paroxysmal Afib     CHADS2Vasc is one for HTN (2 with female gender), on Eliquis, PMG manages labs     She turns 65 this year     Flecainide pill in the pocket     No symptoms of AF  2. HTN     No changes today     Asked her to monitor  3. HLD     Not addressed today  Disposition: F/u with EP in clinic in 1 year, sooner if needed   Current medicines are reviewed at length with the patient today.  The patient did not have any concerns regarding medicines.  Venetia Night, PA-C 08/13/2020 9:47 AM     [place]  [address] [phone] (office)  [phone] (fax)

## 2020-08-16 ENCOUNTER — Other Ambulatory Visit: Payer: Self-pay | Admitting: Physician Assistant

## 2020-08-16 ENCOUNTER — Other Ambulatory Visit: Payer: Self-pay

## 2020-08-16 ENCOUNTER — Encounter: Payer: Self-pay | Admitting: Physician Assistant

## 2020-08-16 ENCOUNTER — Ambulatory Visit: Payer: 59 | Admitting: Physician Assistant

## 2020-08-16 VITALS — BP 140/86 | HR 76 | Ht 64.0 in | Wt 177.0 lb

## 2020-08-16 DIAGNOSIS — I10 Essential (primary) hypertension: Secondary | ICD-10-CM | POA: Diagnosis not present

## 2020-08-16 DIAGNOSIS — I480 Paroxysmal atrial fibrillation: Secondary | ICD-10-CM | POA: Diagnosis not present

## 2020-08-16 MED ORDER — APIXABAN 5 MG PO TABS: 5.0000 mg | ORAL_TABLET | Freq: Two times a day (BID) | ORAL | 6 refills | Status: DC

## 2020-08-16 MED ORDER — FLECAINIDE ACETATE 150 MG PO TABS
300.0000 mg | ORAL_TABLET | Freq: Every day | ORAL | 3 refills | Status: DC | PRN
Start: 2020-08-16 — End: 2020-08-16

## 2020-08-16 MED ORDER — DILTIAZEM HCL ER COATED BEADS 120 MG PO CP24: 120.0000 mg | ORAL_CAPSULE | Freq: Every day | ORAL | 0 refills | Status: DC

## 2020-08-16 MED FILL — ELIQUIS 5 MG TABLET: 5 | 30 days supply | Qty: 60 | Fill #0

## 2020-08-16 MED FILL — FLECAINIDE ACETATE 150 MG T: 150 | 5 days supply | Qty: 10 | Fill #0

## 2020-08-16 MED FILL — DILTIAZEM HCL ER COATED BEA: 120 | 15 days supply | Qty: 15 | Fill #0

## 2020-08-16 NOTE — Patient Instructions (Signed)
Medication Instructions:   Your physician recommends that you continue on your current medications as directed. Please refer to the Current Medication list given to you today.  *If you need a refill on your cardiac medications before your next appointment, please call your pharmacy*   Lab Work:  [place]   If you have labs (blood work) drawn today and your tests are completely normal, you will receive your results only by: Marland Kitchen MyChart Message (if you have MyChart) OR . A paper copy in the mail If you have any lab test that is abnormal or we need to change your treatment, we will call you to review the results.   Testing/Procedures: NONE ORDERED  TODAY    Follow-Up: At [facility], you and your health needs are our priority.  As part of our continuing mission to provide you with exceptional heart care, we have created designated Provider Care Teams.  These Care Teams include your primary Cardiologist (physician) and Advanced Practice Providers (APPs -  Physician Assistants and Nurse Practitioners) who all work together to provide you with the care you need, when you need it.  We recommend signing up for the patient portal called "MyChart".  Sign up information is provided on this After Visit Summary.  MyChart is used to connect with patients for Virtual Visits (Telemedicine).  Patients are able to view lab/test results, encounter notes, upcoming appointments, etc.  Non-urgent messages can be sent to your provider as well.   To learn more about what you can do with MyChart, go to [email or web address].    Your next appointment:   1 year(s)  The format for your next appointment:   In Person  Provider:   Allegra Lai, MD    Other Instructions

## 2020-08-19 ENCOUNTER — Other Ambulatory Visit (HOSPITAL_BASED_OUTPATIENT_CLINIC_OR_DEPARTMENT_OTHER): Payer: Self-pay

## 2020-09-05 ENCOUNTER — Inpatient Hospital Stay: Admission: RE | Admit: 2020-09-05 | Payer: 59 | Source: Ambulatory Visit

## 2020-09-05 ENCOUNTER — Non-Acute Institutional Stay (HOSPITAL_COMMUNITY)
Admission: RE | Admit: 2020-09-05 | Discharge: 2020-09-05 | Disposition: A | Discharge status: Home / Self Care | Payer: 59 | Source: Ambulatory Visit | Attending: Internal Medicine | Admitting: Internal Medicine

## 2020-09-05 ENCOUNTER — Other Ambulatory Visit: Payer: Self-pay

## 2020-09-05 DIAGNOSIS — K50119 Crohn's disease of large intestine with unspecified complications: Secondary | ICD-10-CM | POA: Diagnosis not present

## 2020-09-05 MED ORDER — SODIUM CHLORIDE 0.9 % IV SOLN
10.0000 mg/kg | INTRAVENOUS | Status: DC
  Administered 2020-09-05: 800 mg via INTRAVENOUS
  Filled 2020-09-05: qty 80

## 2020-09-05 MED ORDER — SODIUM CHLORIDE 0.9 % IV SOLN
INTRAVENOUS | Status: DC | PRN
  Administered 2020-09-05: 250 mL via INTRAVENOUS

## 2020-09-05 NOTE — Progress Notes (Signed)
PATIENT CARE CENTER NOTE  Diagnosis:Crohn's Disease   Provider:Daniel Ardis Hughs, MD   Procedure:Inflectra ( Infliximab-dyyb) IV infusion   Note:Patient receivedInflectrainfusion ( dose #3 of 6)via PIV. Patient had taken pre-medications prior to arrival. Patient tolerated well with no adverse reaction. Vital signs stable. Pt declined AVS and declined to stay for 60 minute observation post infusion.Patient to come back every6weeks for infusion. Alert, oriented and ambulatory at discharge.

## 2020-09-07 ENCOUNTER — Other Ambulatory Visit (HOSPITAL_COMMUNITY): Payer: Self-pay

## 2020-09-07 ENCOUNTER — Other Ambulatory Visit: Payer: Self-pay | Admitting: Physician Assistant

## 2020-09-07 ENCOUNTER — Other Ambulatory Visit: Payer: Self-pay | Admitting: Gastroenterology

## 2020-09-07 ENCOUNTER — Other Ambulatory Visit: Payer: Self-pay | Admitting: Cardiology

## 2020-09-07 ENCOUNTER — Other Ambulatory Visit: Payer: Self-pay

## 2020-09-07 DIAGNOSIS — I10 Essential (primary) hypertension: Secondary | ICD-10-CM

## 2020-09-07 DIAGNOSIS — I480 Paroxysmal atrial fibrillation: Secondary | ICD-10-CM

## 2020-09-07 MED ORDER — DILTIAZEM HCL ER COATED BEADS 120 MG PO CP24
ORAL_CAPSULE | ORAL | 0 refills | Status: DC
  Filled 2020-09-07: qty 15, 15d supply, fill #0

## 2020-09-07 MED ORDER — METOPROLOL SUCCINATE ER 25 MG PO TB24
ORAL_TABLET | Freq: Two times a day (BID) | ORAL | 3 refills | Status: DC
Start: 2020-09-07 — End: 2021-09-21
  Filled 2020-09-07: qty 180, 90d supply, fill #0
  Filled 2020-12-19: qty 180, 90d supply, fill #1
  Filled 2021-03-21: qty 180, 90d supply, fill #2
  Filled 2021-06-14 – 2021-06-23 (×2): qty 180, 90d supply, fill #3

## 2020-09-07 MED ORDER — AZATHIOPRINE 50 MG PO TABS
ORAL_TABLET | ORAL | 3 refills | Status: DC
  Filled 2020-09-07: qty 180, 60d supply, fill #0
  Filled 2020-11-09: qty 180, 60d supply, fill #1
  Filled 2021-01-09: qty 180, 60d supply, fill #2
  Filled 2021-03-09: qty 180, 60d supply, fill #3

## 2020-09-07 MED ORDER — AZATHIOPRINE 50 MG PO TABS
ORAL_TABLET | ORAL | 1 refills | Status: DC
  Filled 2020-09-07: qty 90, 30d supply, fill #0

## 2020-09-08 ENCOUNTER — Other Ambulatory Visit (HOSPITAL_COMMUNITY): Payer: Self-pay

## 2020-09-12 ENCOUNTER — Other Ambulatory Visit (HOSPITAL_COMMUNITY): Payer: Self-pay

## 2020-09-20 ENCOUNTER — Other Ambulatory Visit: Payer: Self-pay | Admitting: Physician Assistant

## 2020-09-20 ENCOUNTER — Other Ambulatory Visit (HOSPITAL_COMMUNITY): Payer: Self-pay

## 2020-09-20 MED ORDER — DILTIAZEM HCL ER COATED BEADS 120 MG PO CP24
120.0000 mg | ORAL_CAPSULE | Freq: Every day | ORAL | 3 refills | Status: DC
  Filled 2020-09-20: qty 90, 90d supply, fill #0
  Filled 2020-12-26: qty 90, 90d supply, fill #1
  Filled 2021-03-29: qty 90, 90d supply, fill #2
  Filled 2021-06-28: qty 90, 90d supply, fill #3

## 2020-09-20 MED ORDER — APIXABAN 5 MG PO TABS
5.0000 mg | ORAL_TABLET | Freq: Two times a day (BID) | ORAL | 0 refills | Status: DC
  Filled 2020-09-20: qty 60, 30d supply, fill #0

## 2020-09-20 MED FILL — Apixaban Tab 5 MG: ORAL | 30 days supply | Qty: 60 | Fill #0 | Status: CN

## 2020-09-20 NOTE — Telephone Encounter (Signed)
Pt last saw Tommye Standard, [place] on 08/16/20, last labs 01/23/19 Creat 0.73, pt is overdue for labwork. Pt has physical appt with PCP on 10/04/20, will need CBC and BMP done at [place].  This was not done at [place] with Tommye Standard, PA on 08/16/20.  Will refill x 1 and await labwork from CPX appt with Dr Ronnald Ramp on 10/04/20. Age 65, weight 80.3kg, based on specified criteria pt is on appropriate dosage of Eliquis 54m BID.   This rx was refilled by RTommye Standard PA at [place] 08/16/20 for 60 tabs with 6 refills.  Not sure why we are getting a refill request today, but since pt is still overdue for labwork will refill x 1 to get pt to upcoming CPX appt with Dr JRonnald Rampon 10/04/20 when labwork will be completed.

## 2020-09-22 DIAGNOSIS — H6123 Impacted cerumen, bilateral: Secondary | ICD-10-CM | POA: Diagnosis not present

## 2020-09-22 DIAGNOSIS — H900 Conductive hearing loss, bilateral: Secondary | ICD-10-CM | POA: Diagnosis not present

## 2020-09-29 DIAGNOSIS — H903 Sensorineural hearing loss, bilateral: Secondary | ICD-10-CM | POA: Diagnosis not present

## 2020-09-29 DIAGNOSIS — H6122 Impacted cerumen, left ear: Secondary | ICD-10-CM | POA: Diagnosis not present

## 2020-10-04 ENCOUNTER — Ambulatory Visit (INDEPENDENT_AMBULATORY_CARE_PROVIDER_SITE_OTHER): Payer: 59 | Admitting: Internal Medicine

## 2020-10-04 ENCOUNTER — Encounter: Payer: Self-pay | Admitting: Internal Medicine

## 2020-10-04 ENCOUNTER — Other Ambulatory Visit: Payer: Self-pay

## 2020-10-04 VITALS — BP 158/80 | HR 66 | Temp 98.6°F | Resp 18 | Ht 64.0 in | Wt 175.2 lb

## 2020-10-04 DIAGNOSIS — Z124 Encounter for screening for malignant neoplasm of cervix: Secondary | ICD-10-CM | POA: Diagnosis not present

## 2020-10-04 DIAGNOSIS — Z0001 Encounter for general adult medical examination with abnormal findings: Secondary | ICD-10-CM

## 2020-10-04 DIAGNOSIS — E559 Vitamin D deficiency, unspecified: Secondary | ICD-10-CM | POA: Diagnosis not present

## 2020-10-04 DIAGNOSIS — I10 Essential (primary) hypertension: Secondary | ICD-10-CM | POA: Diagnosis not present

## 2020-10-04 DIAGNOSIS — Z0000 Encounter for general adult medical examination without abnormal findings: Secondary | ICD-10-CM | POA: Diagnosis not present

## 2020-10-04 DIAGNOSIS — D500 Iron deficiency anemia secondary to blood loss (chronic): Secondary | ICD-10-CM | POA: Diagnosis not present

## 2020-10-04 DIAGNOSIS — E519 Thiamine deficiency, unspecified: Secondary | ICD-10-CM | POA: Diagnosis not present

## 2020-10-04 DIAGNOSIS — D510 Vitamin B12 deficiency anemia due to intrinsic factor deficiency: Secondary | ICD-10-CM

## 2020-10-04 DIAGNOSIS — I480 Paroxysmal atrial fibrillation: Secondary | ICD-10-CM | POA: Diagnosis not present

## 2020-10-04 LAB — CBC WITH DIFFERENTIAL/PLATELET
Basophils Absolute: 0 10*3/uL (ref 0.0–0.1)
Basophils Relative: 0.8 % (ref 0.0–3.0)
Eosinophils Absolute: 0.1 10*3/uL (ref 0.0–0.7)
Eosinophils Relative: 1.9 % (ref 0.0–5.0)
HCT: 36.9 % (ref 36.0–46.0)
Hemoglobin: 12.6 g/dL (ref 12.0–15.0)
Lymphocytes Relative: 17.8 % (ref 12.0–46.0)
Lymphs Abs: 1 10*3/uL (ref 0.7–4.0)
MCHC: 34.2 g/dL (ref 30.0–36.0)
MCV: 93.5 fl (ref 78.0–100.0)
Monocytes Absolute: 0.5 10*3/uL (ref 0.1–1.0)
Monocytes Relative: 9 % (ref 3.0–12.0)
Neutro Abs: 4.1 10*3/uL (ref 1.4–7.7)
Neutrophils Relative %: 70.5 % (ref 43.0–77.0)
Platelets: 295 10*3/uL (ref 150.0–400.0)
RBC: 3.95 Mil/uL (ref 3.87–5.11)
RDW: 14.7 % (ref 11.5–15.5)
WBC: 5.8 10*3/uL (ref 4.0–10.5)

## 2020-10-04 LAB — HEPATIC FUNCTION PANEL
ALT: 26 U/L (ref 0–35)
AST: 24 U/L (ref 0–37)
Albumin: 4.2 g/dL (ref 3.5–5.2)
Alkaline Phosphatase: 136 U/L — ABNORMAL HIGH (ref 39–117)
Bilirubin, Direct: 0.1 mg/dL (ref 0.0–0.3)
Total Bilirubin: 0.6 mg/dL (ref 0.2–1.2)
Total Protein: 7.3 g/dL (ref 6.0–8.3)

## 2020-10-04 LAB — BASIC METABOLIC PANEL
BUN: 17 mg/dL (ref 6–23)
CO2: 29 mEq/L (ref 19–32)
Calcium: 9.4 mg/dL (ref 8.4–10.5)
Chloride: 102 mEq/L (ref 96–112)
Creatinine, Ser: 0.71 mg/dL (ref 0.40–1.20)
GFR: 89.78 mL/min (ref >60.00)
Glucose, Bld: 87 mg/dL (ref 70–99)
Potassium: 4 mEq/L (ref 3.5–5.1)
Sodium: 138 mEq/L (ref 135–145)

## 2020-10-04 LAB — LIPID PANEL
Cholesterol: 162 mg/dL (ref 0–200)
HDL: 49.7 mg/dL (ref >39.00)
LDL Cholesterol: 102 mg/dL — ABNORMAL HIGH (ref 0–99)
NonHDL: 112.7
Total CHOL/HDL Ratio: 3
Triglycerides: 52 mg/dL (ref 0.0–149.0)
VLDL: 10.4 mg/dL (ref 0.0–40.0)

## 2020-10-04 LAB — FERRITIN: Ferritin: 58.1 ng/mL (ref 10.0–291.0)

## 2020-10-04 LAB — VITAMIN D 25 HYDROXY (VIT D DEFICIENCY, FRACTURES): VITD: 61.29 ng/mL (ref 30.00–100.00)

## 2020-10-04 LAB — VITAMIN B12: Vitamin B-12: 322 pg/mL (ref 211–911)

## 2020-10-04 LAB — FOLATE: Folate: 24.3 ng/mL (ref >5.9)

## 2020-10-04 LAB — TSH: TSH: 0.62 u[IU]/mL (ref 0.35–4.50)

## 2020-10-04 NOTE — Progress Notes (Signed)
Subjective:  Patient ID: Julie Clark, female    DOB: 10-03-1955  Age: 65 y.o. 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 ) and Hypertension  This visit occurred during the SARS-CoV-2 public health emergency.  Safety protocols were in place, including screening questions prior to the visit, additional usage of staff PPE, and extensive cleaning of exam room while observing appropriate contact time as indicated for disinfecting solutions.    HPI Julie Clark presents for a CPX and f/up -   She has felt well recently and offers no complaints.  She has had no recent palpitations and denies headache, blurred vision, chest pain, shortness of breath, or edema.  Outpatient Medications Prior to Visit  Medication Sig Dispense Refill  . acetaminophen (TYLENOL) 500 MG tablet Take 500 mg by mouth every 6 (six) hours as needed.    Marland Kitchen apixaban (ELIQUIS) 5 MG TABS tablet Take 1 tablet (5 mg total) by mouth 2 (two) times daily. Overdue for labwork, MUST have labwork done for FUTURE refills. 60 tablet 0  . azaTHIOprine (IMURAN) 50 MG tablet TAKE 3 TABLETS BY MOUTH DAILY WITH LUNCH 180 tablet 3  . diltiazem (CARDIZEM CD) 120 MG 24 hr capsule Take 1 capsule (120 mg total) by mouth daily. 90 capsule 3  . flecainide (TAMBOCOR) 150 MG tablet TAKE 2 TABLETS BY MOUTH DAILY AS NEEDED (FOR ATRIAL FIBRILLATION). 10 tablet 3  . inFLIXimab (REMICADE IV) Inject into the vein. Every 6 weeks    . metoprolol succinate (TOPROL-XL) 25 MG 24 hr tablet TAKE 1 TABLET BY MOUTH TWICE DAILY 180 tablet 3  . Polyethylene Glycol 3350 (MIRALAX PO) Take by mouth daily.     Facility-Administered Medications Prior to Visit  Medication Dose Route Frequency Provider Last Rate Last Admin  . Chlorhexidine Gluconate Cloth 2 % PADS 6 each  6 each Topical Once Michael Boston, MD       And  . Chlorhexidine Gluconate Cloth 2 % PADS 6 each  6 each Topical Once Michael Boston, MD      . inFLIXimab (REMICADE) 10 mg/kg = 800 mg in sodium chloride 0.9 % 250 mL infusion  10 mg/kg Intravenous Q6 weeks Milus Banister, MD        ROS Review of Systems  Constitutional: Negative.  Negative for appetite change, diaphoresis, fatigue and unexpected weight change.  HENT: Negative.   Eyes: Negative.   Respiratory: Negative for apnea, shortness of breath and wheezing.   Cardiovascular: Negative for chest pain, palpitations and leg swelling.  Gastrointestinal: Negative for abdominal pain, constipation, diarrhea, nausea and vomiting.  Endocrine: Negative.   Genitourinary: Negative.  Negative for difficulty urinating.  Musculoskeletal: Negative.  Negative for arthralgias.  Skin: Negative.  Negative for color change.  Neurological: Negative.  Negative for dizziness, weakness, light-headedness and headaches.  Hematological: Negative for adenopathy. Does not bruise/bleed easily.  Psychiatric/Behavioral: Negative.     Objective:  BP (!) 158/80 (BP Location: Left Arm, Patient Position: Sitting, Cuff Size: Large)   Pulse 66   Temp 98.6 F (37 C) (Oral)   Resp 18   Ht 5' 4"  (1.626 m)   Wt 175 lb 3.2 oz (79.5 kg)   SpO2 96%   BMI 30.07 kg/m   BP Readings from Last 3 Encounters:  10/04/20 (!) 158/80  09/05/20 (!) 152/73  08/16/20 140/86    Wt Readings from Last 3 Encounters:  10/04/20  175 lb 3.2 oz (79.5 kg)  08/16/20 177 lb (80.3 kg)  07/25/20 174 lb 13.2 oz (79.3 kg)    Physical Exam Vitals reviewed.  HENT:     Nose: Nose normal.     Mouth/Throat:     Mouth: Mucous membranes are moist.  Eyes:     General: No scleral icterus.    Conjunctiva/sclera: Conjunctivae normal.  Cardiovascular:     Rate and Rhythm: Normal rate and regular rhythm.     Heart sounds: No murmur heard.   Pulmonary:     Effort: Pulmonary effort is normal.     Breath sounds: No stridor. No wheezing, rhonchi or rales.  Abdominal:     General: Abdomen is flat.     Palpations:  There is no mass.     Tenderness: There is no abdominal tenderness. There is no guarding.  Musculoskeletal:        General: Normal range of motion.     Cervical back: Neck supple.     Right lower leg: No edema.     Left lower leg: No edema.  Lymphadenopathy:     Cervical: No cervical adenopathy.  Skin:    General: Skin is warm and dry.  Neurological:     General: No focal deficit present.     Mental Status: She is alert.  Psychiatric:        Mood and Affect: Mood normal.     Lab Results  Component Value Date   WBC 5.8 10/04/2020   HGB 12.6 10/04/2020   HCT 36.9 10/04/2020   PLT 295.0 10/04/2020   GLUCOSE 87 10/04/2020   CHOL 162 10/04/2020   TRIG 52.0 10/04/2020   HDL 49.70 10/04/2020   LDLCALC 102 (H) 10/04/2020   ALT 26 10/04/2020   AST 24 10/04/2020   NA 138 10/04/2020   K 4.0 10/04/2020   CL 102 10/04/2020   CREATININE 0.71 10/04/2020   BUN 17 10/04/2020   CO2 29 10/04/2020   TSH 0.62 10/04/2020    No results found.  Assessment & Plan:   Julie Clark was seen today for annual exam and hypertension.  Diagnoses and all orders for this visit:  Essential hypertension, benign- Her blood pressure is not adequately well controlled.  She is not willing to add another agent.  She agrees to improve her lifestyle modifications. -     Basic metabolic panel; Future -     Hepatic function panel; Future -     TSH; Future -     Basic metabolic panel -     Hepatic function panel -     TSH  Encounter for general adult medical examination with abnormal findings- Exam completed, labs reviewed, vaccines reviewed and updated, cancer screenings are being addressed, patient education material was given.  Cervical cancer screening -     Ambulatory referral to Gynecology  Paroxysmal atrial fibrillation [facility] [address])- She has good rate and rhythm control.  Will continue the DOAC. -     TSH; Future -     TSH  Iron deficiency anemia due to chronic blood loss- Her H&H are normal now. -      CBC with Differential/Platelet; Future -     Ferritin; Future -     CBC with Differential/Platelet -     Ferritin  Routine general medical examination at a health care facility -     Lipid panel; Future -     Lipid panel  Thiamine deficiency- I will monitor her thiamine level. -  Vitamin B1; Future -     CBC with Differential/Platelet; Future -     Vitamin B1 -     CBC with Differential/Platelet  Vitamin B12 deficiency anemia due to intrinsic factor deficiency- Her B12 level is normal now. -     Vitamin B12; Future -     Folate; Future -     Vitamin B12 -     Folate  Vitamin D deficiency disease- Her vitamin D level is normal now. -     VITAMIN D 25 Hydroxy (Vit-D Deficiency, Fractures); Future -     VITAMIN D 25 Hydroxy (Vit-D Deficiency, Fractures)   I am having Julie Clark maintain her Polyethylene Glycol 3350 (MIRALAX PO), inFLIXimab (REMICADE IV), acetaminophen, flecainide, metoprolol succinate, azaTHIOprine, diltiazem, and apixaban. We will continue to administer inFLIXimab (REMICADE) 10 mg/kg = 800 mg in sodium chloride 0.9 % 250 mL infusion.  No orders of the defined types were placed in this encounter.    Follow-up: Return in about 6 months (around 04/06/2021).  Scarlette Calico, MD

## 2020-10-04 NOTE — Patient Instructions (Signed)

## 2020-10-07 LAB — VITAMIN B1: Vitamin B1 (Thiamine): 8 nmol/L (ref 8–30)

## 2020-10-17 ENCOUNTER — Non-Acute Institutional Stay (HOSPITAL_COMMUNITY)
Admission: RE | Admit: 2020-10-17 | Discharge: 2020-10-17 | Disposition: A | Discharge status: Home / Self Care | Payer: 59 | Source: Ambulatory Visit | Attending: Internal Medicine | Admitting: Internal Medicine

## 2020-10-17 ENCOUNTER — Other Ambulatory Visit: Payer: Self-pay

## 2020-10-17 DIAGNOSIS — K5090 Crohn's disease, unspecified, without complications: Secondary | ICD-10-CM | POA: Diagnosis not present

## 2020-10-17 DIAGNOSIS — K50119 Crohn's disease of large intestine with unspecified complications: Secondary | ICD-10-CM

## 2020-10-17 MED ORDER — SODIUM CHLORIDE 0.9 % IV SOLN
10.0000 mg/kg | Freq: Once | INTRAVENOUS | Status: AC
  Administered 2020-10-17: 800 mg via INTRAVENOUS
  Filled 2020-10-17: qty 80

## 2020-10-17 MED ORDER — SODIUM CHLORIDE 0.9 % IV SOLN
INTRAVENOUS | Status: DC | PRN
  Administered 2020-10-17: 250 mL via INTRAVENOUS

## 2020-10-17 NOTE — Progress Notes (Signed)
PATIENT CARE CENTER NOTE  Diagnosis:Crohn's Disease   Provider:Daniel Ardis Hughs, MD   Procedure:Inflectra ( Infliximab-dyyb) IV infusion   Note:Patient receivedInflectrainfusion ( dose #4of 6)via PIV. Patienthad taken pre-medications prior to arrival.Patient tolerated well with no adverse reaction. Vital signs stable. Pt declined AVS and declined to stay for 60 minute observation post infusion.Patient to come back every6weeks for infusion. Alert, oriented and ambulatory at discharge.

## 2020-10-19 ENCOUNTER — Other Ambulatory Visit (HOSPITAL_COMMUNITY): Payer: Self-pay

## 2020-10-19 ENCOUNTER — Other Ambulatory Visit: Payer: Self-pay | Admitting: Cardiology

## 2020-10-19 DIAGNOSIS — I480 Paroxysmal atrial fibrillation: Secondary | ICD-10-CM

## 2020-10-19 MED ORDER — ELIQUIS 5 MG PO TABS
5.0000 mg | ORAL_TABLET | Freq: Two times a day (BID) | ORAL | 1 refills | Status: DC
  Filled 2020-10-19: qty 180, 90d supply, fill #0
  Filled 2021-01-19: qty 180, 90d supply, fill #1

## 2020-10-19 NOTE — Telephone Encounter (Signed)
*  STAT* If patient is at the pharmacy, call can be transferred to refill team.   1. Which medications need to be refilled? (please list name of each medication and dose if known)  apixaban (ELIQUIS) 5 MG TABS tablet  2. Which pharmacy/location (including street and city if local pharmacy) is medication to be sent to? [facility]  3. Do they need a 30 day or 90 day supply?  90 day supply  Patient states she has about 3-4 tablets remaining. She had lab work with her PCP on 10/04/20.

## 2020-10-19 NOTE — Telephone Encounter (Signed)
Eliquis 46m refill request received. Patient is 65years old, weight-79.4kg, Crea-0.71 on 10/04/2020, Diagnosis-Afib, and last seen by RTommye Standardon 08/16/2020. Dose is appropriate based on dosing criteria. Will send in refill to requested pharmacy.

## 2020-10-20 ENCOUNTER — Other Ambulatory Visit (HOSPITAL_COMMUNITY): Payer: Self-pay

## 2020-10-26 ENCOUNTER — Ambulatory Visit
Admission: RE | Admit: 2020-10-26 | Discharge: 2020-10-26 | Disposition: A | Discharge status: Home / Self Care | Payer: 59 | Source: Ambulatory Visit | Attending: Internal Medicine | Admitting: Internal Medicine

## 2020-10-26 ENCOUNTER — Other Ambulatory Visit: Payer: Self-pay

## 2020-10-26 DIAGNOSIS — Z1231 Encounter for screening mammogram for malignant neoplasm of breast: Secondary | ICD-10-CM | POA: Diagnosis not present

## 2020-11-09 ENCOUNTER — Other Ambulatory Visit (HOSPITAL_COMMUNITY): Payer: Self-pay

## 2020-11-29 ENCOUNTER — Other Ambulatory Visit: Payer: Self-pay

## 2020-11-29 ENCOUNTER — Non-Acute Institutional Stay (HOSPITAL_COMMUNITY)
Admission: RE | Admit: 2020-11-29 | Discharge: 2020-11-29 | Disposition: A | Discharge status: Home / Self Care | Payer: 59 | Source: Ambulatory Visit | Attending: Internal Medicine | Admitting: Internal Medicine

## 2020-11-29 ENCOUNTER — Other Ambulatory Visit (HOSPITAL_COMMUNITY): Payer: Self-pay

## 2020-11-29 DIAGNOSIS — K5090 Crohn's disease, unspecified, without complications: Secondary | ICD-10-CM | POA: Insufficient documentation

## 2020-11-29 MED ORDER — SODIUM CHLORIDE 0.9 % IV SOLN
10.0000 mg/kg | Freq: Once | INTRAVENOUS | Status: AC
  Administered 2020-11-29: 800 mg via INTRAVENOUS
  Filled 2020-11-29: qty 80

## 2020-11-29 MED ORDER — SODIUM CHLORIDE 0.9 % IV SOLN
INTRAVENOUS | Status: DC | PRN
  Administered 2020-11-29: 250 mL via INTRAVENOUS

## 2020-11-29 NOTE — Progress Notes (Addendum)
PATIENT CARE CENTER NOTE   Diagnosis: Crohn's disease     Provider: Owens Loffler MD     Procedure: Inflectra (Infliximab - dyyb) IV infusion     Note: Patient received Inflectra infusion (dose #5 of 6)  via PIV. Patient had taken Tylenol and Benadryl prior to arrival so pre meds not given. Infusion titrated per protocol. Patient tolerated well with no adverse reaction. Vital signs stable. Pt declined AVS. Patient to come back every 6 weeks for infusion, instructed to make next appointment before leaving, verbalized understanding . Alert, oriented and ambulatory at discharge.

## 2020-12-19 ENCOUNTER — Other Ambulatory Visit (HOSPITAL_COMMUNITY): Payer: Self-pay

## 2020-12-26 ENCOUNTER — Other Ambulatory Visit (HOSPITAL_COMMUNITY): Payer: Self-pay

## 2021-01-06 ENCOUNTER — Other Ambulatory Visit: Payer: Self-pay

## 2021-01-06 DIAGNOSIS — K50119 Crohn's disease of large intestine with unspecified complications: Secondary | ICD-10-CM

## 2021-01-09 ENCOUNTER — Other Ambulatory Visit (HOSPITAL_COMMUNITY): Payer: Self-pay

## 2021-01-09 ENCOUNTER — Non-Acute Institutional Stay (HOSPITAL_COMMUNITY)
Admission: RE | Admit: 2021-01-09 | Discharge: 2021-01-09 | Disposition: A | Discharge status: Home / Self Care | Payer: 59 | Source: Ambulatory Visit | Attending: Internal Medicine | Admitting: Internal Medicine

## 2021-01-09 ENCOUNTER — Other Ambulatory Visit: Payer: Self-pay

## 2021-01-09 DIAGNOSIS — K5090 Crohn's disease, unspecified, without complications: Secondary | ICD-10-CM | POA: Diagnosis not present

## 2021-01-09 MED ORDER — SODIUM CHLORIDE 0.9 % IV SOLN
800.0000 mg | Freq: Once | INTRAVENOUS | Status: AC
  Administered 2021-01-09: 800 mg via INTRAVENOUS
  Filled 2021-01-09: qty 80

## 2021-01-09 MED ORDER — SODIUM CHLORIDE 0.9 % IV SOLN
INTRAVENOUS | Status: DC | PRN
  Administered 2021-01-09: 250 mL via INTRAVENOUS

## 2021-01-09 NOTE — Progress Notes (Signed)
PATIENT CARE CENTER NOTE   Diagnosis: Crohn's Disease     Provider: Owens Loffler, MD     Procedure: Inflectra (Infliximab-dyyb) IV infusion     Note: Patient received Inflectra infusion ( dose #6 of 6) via PIV. Patient had taken pre-medications prior to arrival. Patient tolerated well with no adverse reaction. Vital signs stable. Pt declined AVS and declined to stay for 60 minute observation post infusion. Patient to come back every 6 weeks for infusion. Alert, oriented and ambulatory at discharge.

## 2021-01-17 ENCOUNTER — Ambulatory Visit: Payer: 59 | Admitting: Gastroenterology

## 2021-01-19 ENCOUNTER — Other Ambulatory Visit (HOSPITAL_COMMUNITY): Payer: Self-pay

## 2021-02-20 ENCOUNTER — Non-Acute Institutional Stay (HOSPITAL_COMMUNITY)
Admission: RE | Admit: 2021-02-20 | Discharge: 2021-02-20 | Disposition: A | Discharge status: Home / Self Care | Payer: 59 | Source: Ambulatory Visit | Attending: Internal Medicine | Admitting: Internal Medicine

## 2021-02-20 ENCOUNTER — Other Ambulatory Visit: Payer: Self-pay

## 2021-02-20 DIAGNOSIS — K50119 Crohn's disease of large intestine with unspecified complications: Secondary | ICD-10-CM

## 2021-02-20 DIAGNOSIS — K50911 Crohn's disease, unspecified, with rectal bleeding: Secondary | ICD-10-CM | POA: Diagnosis not present

## 2021-02-20 MED ORDER — INFLIXIMAB-DYYB 100 MG IV SOLR
10.0000 mg/kg | INTRAVENOUS | Status: DC
Start: 2021-02-20 — End: 2021-02-21
  Administered 2021-02-20: 800 mg via INTRAVENOUS
  Filled 2021-02-20: qty 80

## 2021-02-20 MED ORDER — SODIUM CHLORIDE 0.9 % IV SOLN
INTRAVENOUS | Status: DC | PRN
  Administered 2021-02-20: 250 mL via INTRAVENOUS

## 2021-02-20 NOTE — Progress Notes (Signed)
PATIENT CARE CENTER NOTE   Diagnosis: Crohn's disease     Provider:  Owens Loffler MD     Procedure: Inflectra ( Infliximab -dyyb ) 820m     Note: Patient received Inflectra infusion ( dose #1 of 6) via PIV. Patient had taken Tylenol  and Benadryl prior to arrival so premeds not given. Infusion titrated per protocol. Patient tolerated well with no adverse reaction. Vital signs stable. Pt declined AVS. Patient to come back every 6 weeks for infusion, pt instructed to schedule next infusion prior to leaving, verbalized understanding. Alert, oriented and ambulatory at discharge.

## 2021-03-01 ENCOUNTER — Telehealth: Payer: Self-pay | Admitting: Gastroenterology

## 2021-03-01 NOTE — Telephone Encounter (Signed)
[facility] will cost her less out of pocket.  Is there any way to find out??

## 2021-03-01 NOTE — Telephone Encounter (Signed)
Amy can you help with this?

## 2021-03-02 NOTE — Telephone Encounter (Signed)
I spoke with the [facility] and there is no way to know how much it will cost without actually setting up infusions at the center.  She did state that the pt should call her insurance company and ask about the coverage for the [facility].  The only difference she states is that there is not a facility charge at [facility] but she can not say if it would be any cheaper otherwise.  Naureen has been advised and will call her insurance company and let us know how she would like to proceed.

## 2021-03-02 NOTE — Telephone Encounter (Signed)
I don't have any information on cost for any facility.  Contact that infusion center and they should be able to tell you over the phone. They do their own precerts if it's the same facility that is on [address]. Her coverage would probably be the same, but billing amounts may be different.

## 2021-03-02 NOTE — Telephone Encounter (Signed)
Call placed to the [facility] center [phone] to inquire on cost.  I was transferred to [facility] and had to leave a message for her to return my call.

## 2021-03-03 ENCOUNTER — Telehealth: Payer: Self-pay | Admitting: Pharmacy Technician

## 2021-03-03 ENCOUNTER — Encounter: Payer: Self-pay | Admitting: Gastroenterology

## 2021-03-03 ENCOUNTER — Other Ambulatory Visit: Payer: Self-pay

## 2021-03-03 ENCOUNTER — Telehealth: Payer: Self-pay

## 2021-03-03 DIAGNOSIS — K50114 Crohn's disease of large intestine with abscess: Secondary | ICD-10-CM | POA: Insufficient documentation

## 2021-03-03 DIAGNOSIS — K50119 Crohn's disease of large intestine with unspecified complications: Secondary | ICD-10-CM | POA: Insufficient documentation

## 2021-03-03 NOTE — Telephone Encounter (Signed)
Dr Ardis Hughs I heard from [place] and she wants to transfer her care to the [facility].  I will send the referral.

## 2021-03-03 NOTE — Addendum Note (Signed)
Addended by: Timothy Lasso on: 03/03/2021 01:44 PM   Modules accepted: Orders

## 2021-03-03 NOTE — Telephone Encounter (Addendum)
Auth Submission: PENDING -  (CHANGE IN SITE) Payer: [employer] Medication & CPT/J Code(s) submitted: AVSOLA Route of submission (phone, fax, portal): PHONE: [phone] Auth type: Buy/Bill Units/visits requested: 15m/kg Q6WKS Reference number: [id number]

## 2021-03-09 ENCOUNTER — Other Ambulatory Visit (HOSPITAL_COMMUNITY): Payer: Self-pay

## 2021-03-09 ENCOUNTER — Telehealth: Payer: Self-pay

## 2021-03-09 NOTE — Telephone Encounter (Signed)
-----   Message from Milus Banister, MD sent at 03/09/2021  7:13 AM EDT ----- Regarding: RE: infliximab biosimilar OK to use Avsola, 32m/kg dosing.  Thanks   ----- Message ----- From: [facility] Res [facility]: 03/08/2021   3:38 PM EDT To: DMilus Banister MD, PTimothy Lasso RN, # Subject: RE: infliximab biosimilar                      Sorry for the confusion.   I'm asking to see if it is ok to use Avsola instead.  YOrpah Cobb ----- Message ----- From: JMilus Banister MD Sent: 03/08/2021   3:36 PM EDT To: PTimothy Lasso RN, KWynetta Fines CPhT, # Subject: RE: infliximab biosimilar                      I'm OK with inflectra.  Thanks  ----- Message ----- From: PTresa Res [facility]ent: 03/08/2021   1:04 PM EDT To: DMilus Banister MD, PTimothy Lasso RN, # Subject: infliximab biosimilar                          Hello Dr. JArdis Hughs  The patients insurance prefer Inflectraor Avosla as the infliximab product of choice. Are you ok with uKoreausing Avsola for this patient (instead of Remicade). Using preferred agents will likely be lower cost and also [facility] fee, so may lead to lower total out of pocket $$ for the patient.  Thanks, YEli [employer]

## 2021-03-13 ENCOUNTER — Other Ambulatory Visit: Payer: Self-pay | Admitting: Pharmacy Technician

## 2021-03-13 NOTE — Telephone Encounter (Signed)
Dr. Ardis Hughs,  Josem Kaufmann Submission: Julie Clark - APPROVED Payer: [employer] Medication & CPT/J Code(s) submitted: Avsola (infliximab-axxq) [phone] Route of submission (phone, fax, portal): PHONE: [phone] Auth type: BUY/BILL Units/visits requested: 10MG/KG Q6WKS Reference number: [id number] Approval from: 03/03/21 to 03/03/22 at [facility]  We will schedule patient as soon as possible.

## 2021-03-15 ENCOUNTER — Encounter: Payer: Self-pay | Admitting: Gastroenterology

## 2021-03-15 ENCOUNTER — Ambulatory Visit: Payer: 59 | Admitting: Gastroenterology

## 2021-03-15 VITALS — BP 130/74 | HR 70 | Ht 64.0 in | Wt 166.0 lb

## 2021-03-15 DIAGNOSIS — K50119 Crohn's disease of large intestine with unspecified complications: Secondary | ICD-10-CM

## 2021-03-15 NOTE — Patient Instructions (Signed)
If you are age 65 or younger, your body mass index should be between 19-25. Your Body mass index is 28.49 kg/m. If this is out of the aformentioned range listed, please consider follow up with your Primary Care Provider.   ________________________________________________________  The [facility] providers would like to encourage you to use [facility] to communicate with providers for non-urgent requests or questions.  Due to long hold times on the telephone, sending your provider a message by [facility] may be a faster and more efficient way to get a response.  Please allow 48 business hours for a response.  Please remember that this is for non-urgent requests.  _______________________________________________________  Your provider has requested that you go to the basement level for lab work before leaving today. Press "B" on the elevator. The lab is located at the first door on the left as you exit the elevator.  Due to recent changes in healthcare laws, you may see the results of your imaging and laboratory studies on MyChart before your provider has had a chance to review them.  We understand that in some cases there may be results that are confusing or concerning to you. Not all laboratory results come back in the same time frame and the provider may be waiting for multiple results in order to interpret others.  Please give [place] 48 hours in order for your provider to thoroughly review all the results before contacting the office for clarification of your results.   You will need to follow up with our office in April 2023.  We will contact you to schedule this appointment.  Thank you for entrusting me with your care and choosing [facility].  Dr Ardis Hughs

## 2021-03-15 NOTE — Progress Notes (Signed)
Review of pertinent gastrointestinal problems: 1. Crohn's disease: Crohn's colitis with severe perianal disease.  No evidence of small bowel disease on 09/2017 CT enterography.  Colonoscopy 09/2017 showed normal terminal ileum, patchy deep ulcers segmentally throughout her colon, severe perianal disease with fistulas and early abscess that was treated by oral Abx, debridement, seton placement 09/2017, Dr. Johney Maine.  Colonoscopy 12/2012 for average risk screening; incidental inflammation right colon (path suggested possible IBD) was attributed to NSAID use at the time.  Colonoscopy December 2021 was much improved.  Her terminal ileum appeared normal.  Her perianal disease was significantly improved so much so that I removed her last seton at the time of the colonoscopy.  There was a single site of inflammation in her a sending colon that was fairly focal mild, the lumen was somewhat strictured at that site.  The colon mucosa was otherwise normal right and left colon without obvious inflammation.  The terminal ileum was normal.  Pathology showed no sign of active inflammation except for a single small "inflammatory polyp which I had removed. Labs 09/2017: TB quant gold negative, Hepatitis B Surface Ab negative, Surface Ag negative, Hep C Ab negative; TPMT activity level NORMAL. TB quant gold 03/2020 negative Dual therapy with immunomodulators (azathiaprine) and biologic (humira) started 09/2017.  Poor response of perianal disease led to drug level testing (01/2018) adalimumab was 5.6 (low) at trough, no antibodies detected.  Humira increased to 48m every other week.  Continued poor perianal response led to repeat drug testing 03/2018) adalimumab trough level was 6.8 (still lower than ideal) with no antibodies detected.  03/2018 Changed humira dosing to 417monce weekly, also increased azathiaprine to 15070mnce daily.   2020 despite 40 mg once weekly Humira her drug levels were lower than acceptable.  Changed to 5 mg/kg  Remicade 2020. Remicade drug levels 08/2018: suboptimal (1.3mc72mL at 5mg/50mremicade).  ADA undetectable.  Increased remicade to 10mg/48m10 mg/kg Remicade drug levels still low, requested 10 mg/kg Remicade every 6 weeks however this was denied by her insurance company.  Formal appeals process started in late August 2020.  Eventually this was OK'd by her insurance company Exam under anesthesia 01/23/2019 with Dr. ChristNadeen Landausion and drainage of 2 small separate abscess cavities perianal.  Also placement of 4 separate, new perianal setons. 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  She was overall doing quite well.  She was on Remicade 10 mg/kg every 6 weeks.  Azathioprine 150 mg once daily.  Her perianal disease was getting under much better control overall.  The site of her previous setons all look much improved, healed except for one site on her right buttocks about 6 cm from the anus which was reddish, not fluctuant but was definitely a bit tender.  I asked that she get into see Dr. White Dema Severin that.  Blood work April 2022 showed a normal CBC normal basic metabolic profile normal B12, nB35al ferritin.  She is down 9 pounds since her last office visit here 8 months ago.  She has not seen Dr. White Dema Severinite a long time.  Her bottom feels fine.  She has 1-2 soft "bowel movements once daily.  For about 3-week.  She was having a bit more frequent stools but she did have some significant dietary  changes low carbs that may account for that.  No bleeding.  No abdominal pains.  She is changing to the COVID infusion center outpatient and with that change she is changing from one generic form of infliximab to a different 1.  ROS: complete GI ROS as described in HPI, all other review  negative.  Constitutional:  No unintentional weight loss   Past Medical History:  Diagnosis Date   Anticoagulant long-term use    eliquis, managed by cardiology   Crohn's disease of perianal region [facility])    new dx 05/ 2019 by dr Ardis Hughs ([facility])   Heart murmur    Hemorrhoids    internal and external   History of hidradenitis suppurativa    axilla   Hyperlipidemia    Hypertension    PAF (paroxysmal atrial fibrillation) [facility])    cardiologist-  dr Curt Bears--  first dx 07-18-2015   Rectal pain    Uterine fibroid    per MRI 09-16-2017   Wears glasses    Wears glasses     Past Surgical History:  Procedure Laterality Date   CESAREAN SECTION  x2  last one [place]  last one 05-14-2018  dr d. Ardis Hughs @  GI   INCISION AND DRAINAGE ABSCESS N/A 10/11/2017   Procedure: DRAINAGE OF PERIRECTAL ABSCESSES;  Surgeon: Michael Boston, MD;  Location: [place];  Service: General;  Laterality: N/A;   [place] N/A 10/11/2017   Procedure: PLACEMENT OF SETON;  Surgeon: Michael Boston, MD;  Location: [place];  Service: General;  Laterality: N/A;   PLACEMENT OF SETON N/A 01/23/2019   Procedure: PLACEMENT OF SETONS x4;  Surgeon: Ileana Roup, MD;  Location: [place];  Service: General;  Laterality: N/A;   RECTAL EXAM UNDER ANESTHESIA N/A 10/11/2017   Procedure: ANORECTAL EXAM UNDER ANESTHESIA;  Surgeon: Michael Boston, MD;  Location: [place];  Service: General;  Laterality: N/A;   RECTAL EXAM UNDER ANESTHESIA N/A 01/23/2019   Procedure: ANORECTAL EXAM UNDER ANESTHESIA,  INCISION AND DRAINAGE x2;  Surgeon: Ileana Roup, MD;  Location: [place];  Service: General;  Laterality: N/A;   TRANSTHORACIC ECHOCARDIOGRAM  10/08/2012   ef 50-55%,  grade 1 diastolic dysfunction/  trivial MR and TR/  mild LAE    Current Outpatient Medications  Medication  Sig Dispense Refill   acetaminophen (TYLENOL) 500 MG tablet Take 500 mg by mouth every 6 (six) hours as needed.     apixaban (ELIQUIS) 5 MG TABS tablet Take 1 tablet (5 mg total) by mouth 2 (two) times daily. 180 tablet 1   azaTHIOprine (IMURAN) 50 MG tablet TAKE 3 TABLETS BY MOUTH DAILY WITH LUNCH 180 tablet 3   diltiazem (CARDIZEM CD) 120 MG 24 hr capsule Take 1 capsule (120 mg total) by mouth daily. 90 capsule 3   flecainide (TAMBOCOR) 150 MG tablet TAKE 2 TABLETS BY MOUTH DAILY AS NEEDED (FOR ATRIAL FIBRILLATION). 10 tablet 3   inFLIXimab (REMICADE IV) Inject into the vein. Every 6 weeks     metoprolol succinate (TOPROL-XL) 25 MG 24 hr tablet TAKE 1 TABLET BY MOUTH TWICE DAILY 180 tablet 3   Polyethylene Glycol 3350 (MIRALAX PO) Take by mouth daily.     Current Facility-Administered Medications  Medication Dose Route Frequency Provider Last Rate Last Admin   inFLIXimab (REMICADE) 10 mg/kg = 800 mg in sodium chloride 0.9 % 250 mL  infusion  10 mg/kg Intravenous Q6 weeks Milus Banister, MD       Facility-Administered Medications Ordered in Other Visits  Medication Dose Route Frequency Provider Last Rate Last Admin   Chlorhexidine Gluconate Cloth 2 % PADS 6 each  6 each Topical Once Michael Boston, MD       And   Chlorhexidine Gluconate Cloth 2 % PADS 6 each  6 each Topical Once Michael Boston, MD        Allergies as of 03/15/2021 - Review Complete 03/15/2021  Allergen Reaction Noted   Latex Rash 10/10/2017   Lovastatin  08/11/2012   Protonix [pantoprazole] Diarrhea 10/10/2017   Adhesive [tape] Rash 10/10/2017   Penicillins Rash 08/11/2012    Family History  Problem Relation Age of Onset   Stroke Mother    Hypertension Mother    Hyperlipidemia Mother    Thyroid disease Mother    Prostate cancer Father    Colon cancer Maternal Aunt    Diabetes Neg Hx    Depression Neg Hx    Early death Neg Hx    Hearing loss Neg Hx    Heart disease Neg Hx    Kidney disease Neg Hx    Alcohol  abuse Neg Hx    Breast cancer Neg Hx    Esophageal cancer Neg Hx    Stomach cancer Neg Hx    Rectal cancer Neg Hx     Social History   Socioeconomic History   Marital status: Married    Spouse name: Not on file   Number of children: Not on file   Years of education: Not on file   Highest education level: Not on file  Occupational History   Occupation: registered Optician, dispensing: [employer]  Tobacco Use   Smoking status: Never   Smokeless tobacco: Never  Vaping Use   Vaping Use: Never used  Substance and Sexual Activity   Alcohol use: No   Drug use: No   Sexual activity: Not on file  Other Topics Concern   Not on file  Social History Narrative   Married to the love of her life, Public affairs consultant   Social Determinants of Health   Financial Resource Strain: Not on file  Food Insecurity: Not on file  Transportation Needs: Not on file  Physical Activity: Not on file  Stress: Not on file  Social Connections: Not on file  Intimate Partner Violence: Not on file     Physical Exam: BP 130/74   Pulse 70   Ht 5' 4"  (1.626 m)   Wt 166 lb (75.3 kg)   BMI 28.49 kg/m  Constitutional: generally well-appearing Psychiatric: alert and oriented x3 Abdomen: soft, nontender, nondistended, no obvious ascites, no peritoneal signs, normal bowel sounds No peripheral edema noted in lower extremities  Assessment and plan: 65 y.o. female with Crohn's colitis with significant perianal disease  Her disease has been under very good control for least about a year now.  She is thrilled and I am very happy for her.  She will continue with infliximab 10 mg/kg every 6 weeks.  She will continue azathioprine 150 mg once daily.  I would like to see her back in the office in 6 months.  She will get basic set of labs today including TB quant test as well as CBC and complete metabolic profile.  Please see the "Patient Instructions" section for addition details about the plan.  Owens Loffler, MD [facility] 03/15/2021, 2:53 PM  Total time on date of encounter was 30 minutes (this included time spent preparing to see the patient reviewing records; obtaining and/or reviewing separately obtained history; performing a medically appropriate exam and/or evaluation; counseling and educating the patient and family if present; ordering medications, tests or procedures if applicable; and documenting clinical information in the health record).

## 2021-03-21 ENCOUNTER — Other Ambulatory Visit (HOSPITAL_COMMUNITY): Payer: Self-pay

## 2021-03-22 ENCOUNTER — Other Ambulatory Visit (INDEPENDENT_AMBULATORY_CARE_PROVIDER_SITE_OTHER): Payer: 59

## 2021-03-22 DIAGNOSIS — K50119 Crohn's disease of large intestine with unspecified complications: Secondary | ICD-10-CM | POA: Diagnosis not present

## 2021-03-22 LAB — CBC
HCT: 37.5 % (ref 36.0–46.0)
Hemoglobin: 12.3 g/dL (ref 12.0–15.0)
MCHC: 32.8 g/dL (ref 30.0–36.0)
MCV: 93.1 fl (ref 78.0–100.0)
Platelets: 278 10*3/uL (ref 150.0–400.0)
RBC: 4.03 Mil/uL (ref 3.87–5.11)
RDW: 17.1 % — ABNORMAL HIGH (ref 11.5–15.5)
WBC: 4.8 10*3/uL (ref 4.0–10.5)

## 2021-03-22 LAB — COMPREHENSIVE METABOLIC PANEL
ALT: 28 U/L (ref 0–35)
AST: 27 U/L (ref 0–37)
Albumin: 3.9 g/dL (ref 3.5–5.2)
Alkaline Phosphatase: 140 U/L — ABNORMAL HIGH (ref 39–117)
BUN: 21 mg/dL (ref 6–23)
CO2: 30 mEq/L (ref 19–32)
Calcium: 9 mg/dL (ref 8.4–10.5)
Chloride: 103 mEq/L (ref 96–112)
Creatinine, Ser: 0.65 mg/dL (ref 0.40–1.20)
GFR: 92.66 mL/min (ref >60.00)
Glucose, Bld: 106 mg/dL — ABNORMAL HIGH (ref 70–99)
Potassium: 3.8 mEq/L (ref 3.5–5.1)
Sodium: 139 mEq/L (ref 135–145)
Total Bilirubin: 0.7 mg/dL (ref 0.2–1.2)
Total Protein: 7 g/dL (ref 6.0–8.3)

## 2021-03-24 LAB — QUANTIFERON-TB GOLD PLUS
Mitogen-NIL: 3.02 IU/mL
NIL: 0.04 IU/mL
QuantiFERON-TB Gold Plus: NEGATIVE
TB1-NIL: 0 IU/mL
TB2-NIL: 0 IU/mL

## 2021-03-29 ENCOUNTER — Other Ambulatory Visit (HOSPITAL_COMMUNITY): Payer: Self-pay

## 2021-04-03 ENCOUNTER — Other Ambulatory Visit: Payer: Self-pay

## 2021-04-03 ENCOUNTER — Inpatient Hospital Stay (HOSPITAL_COMMUNITY)
Admission: RE | Admit: 2021-04-03 | Discharge: 2021-04-03 | Disposition: A | Discharge status: Home / Self Care | Payer: 59 | Source: Ambulatory Visit

## 2021-04-03 ENCOUNTER — Ambulatory Visit (INDEPENDENT_AMBULATORY_CARE_PROVIDER_SITE_OTHER): Payer: 59

## 2021-04-03 VITALS — BP 157/56 | HR 61 | Temp 97.9°F | Resp 18 | Ht 64.0 in | Wt 163.2 lb

## 2021-04-03 DIAGNOSIS — K50114 Crohn's disease of large intestine with abscess: Secondary | ICD-10-CM

## 2021-04-03 DIAGNOSIS — K50119 Crohn's disease of large intestine with unspecified complications: Secondary | ICD-10-CM

## 2021-04-03 MED ORDER — ALBUTEROL SULFATE HFA 108 (90 BASE) MCG/ACT IN AERS: 2.0000 | INHALATION_SPRAY | Freq: Once | RESPIRATORY_TRACT | Status: DC | PRN

## 2021-04-03 MED ORDER — EPINEPHRINE 0.3 MG/0.3ML IJ SOAJ: 0.3000 mg | Freq: Once | INTRAMUSCULAR | Status: DC | PRN

## 2021-04-03 MED ORDER — HYDROCORTISONE NA SUCCINATE PF 100 MG IJ SOLR: 100.0000 mg | Freq: Once | INTRAMUSCULAR | Status: DC

## 2021-04-03 MED ORDER — ACETAMINOPHEN 325 MG PO TABS: 650.0000 mg | ORAL_TABLET | Freq: Once | ORAL | Status: DC

## 2021-04-03 MED ORDER — DIPHENHYDRAMINE HCL 50 MG/ML IJ SOLN: 50.0000 mg | Freq: Once | INTRAMUSCULAR | Status: DC | PRN

## 2021-04-03 MED ORDER — SODIUM CHLORIDE 0.9 % IV SOLN: Freq: Once | INTRAVENOUS | Status: DC | PRN

## 2021-04-03 MED ORDER — METHYLPREDNISOLONE SODIUM SUCC 125 MG IJ SOLR: 125.0000 mg | Freq: Once | INTRAMUSCULAR | Status: DC | PRN

## 2021-04-03 MED ORDER — DIPHENHYDRAMINE HCL 25 MG PO CAPS: 25.0000 mg | ORAL_CAPSULE | Freq: Once | ORAL | Status: DC

## 2021-04-03 MED ORDER — SODIUM CHLORIDE 0.9 % IV SOLN
10.0000 mg/kg | Freq: Once | INTRAVENOUS | Status: AC
  Administered 2021-04-03: 700 mg via INTRAVENOUS
  Filled 2021-04-03: qty 70

## 2021-04-03 MED ORDER — FAMOTIDINE IN NACL 20-0.9 MG/50ML-% IV SOLN: 20.0000 mg | Freq: Once | INTRAVENOUS | Status: DC | PRN

## 2021-04-03 NOTE — Progress Notes (Addendum)
Diagnosis: Crohn's Disease  Provider:  Marshell Garfinkel, MD  Procedure: Infusion  IV Type: Peripheral, IV Location: L Antecubital  Avsola, Dose: 770m  Infusion Start Time: 18483 Infusion Stop Time: 1250  Post Infusion IV Care: Peripheral IV Discontinued Pt Declined observation period. Has had these infusions before.   Discharge: Condition: Good, Destination: Home . AVS provided to patient.   Performed by:  Navah Grondin, LSherlon Handing LPN

## 2021-04-17 ENCOUNTER — Other Ambulatory Visit: Payer: Self-pay | Admitting: Cardiology

## 2021-04-17 ENCOUNTER — Other Ambulatory Visit (HOSPITAL_COMMUNITY): Payer: Self-pay

## 2021-04-17 DIAGNOSIS — I480 Paroxysmal atrial fibrillation: Secondary | ICD-10-CM

## 2021-04-17 MED ORDER — APIXABAN 5 MG PO TABS
5.0000 mg | ORAL_TABLET | Freq: Two times a day (BID) | ORAL | 1 refills | Status: DC
  Filled 2021-04-17: qty 180, 90d supply, fill #0
  Filled 2021-07-20: qty 180, 90d supply, fill #1

## 2021-04-17 NOTE — Telephone Encounter (Signed)
Eliquis 5 mg refill request received. Patient is 65 years old, weight- 74 kg, Crea- 0.65 on 03/22/21, Diagnosis- PAF, and last seen by Tommye Standard, PA on 08/16/20. Dose is appropriate based on dosing criteria. Will send in refill to requested pharmacy.

## 2021-04-25 NOTE — Progress Notes (Signed)
Diagnosis: Crohn's Disease  Provider:  Marshell Garfinkel, MD  Procedure: Infusion  IV Type: Peripheral, IV Location: L Antecubital  Avsola, Dose: 741m  Infusion Start Time: 11278 Infusion Stop Time: 1250  Post Infusion IV Care: Peripheral IV Discontinued Pt Declined observation period. Has had these infusions before.   Discharge: Condition: Good, Destination: Home . AVS provided to patient.   Performed by:  CKoren Shiver RN

## 2021-04-27 DIAGNOSIS — H5203 Hypermetropia, bilateral: Secondary | ICD-10-CM | POA: Diagnosis not present

## 2021-04-27 DIAGNOSIS — H52222 Regular astigmatism, left eye: Secondary | ICD-10-CM | POA: Diagnosis not present

## 2021-04-27 DIAGNOSIS — H2513 Age-related nuclear cataract, bilateral: Secondary | ICD-10-CM | POA: Diagnosis not present

## 2021-04-27 DIAGNOSIS — H524 Presbyopia: Secondary | ICD-10-CM | POA: Diagnosis not present

## 2021-05-08 ENCOUNTER — Other Ambulatory Visit: Payer: Self-pay | Admitting: Gastroenterology

## 2021-05-08 ENCOUNTER — Other Ambulatory Visit (HOSPITAL_COMMUNITY): Payer: Self-pay

## 2021-05-08 MED ORDER — AZATHIOPRINE 50 MG PO TABS
ORAL_TABLET | ORAL | 1 refills | Status: DC
  Filled 2021-05-08: qty 180, 60d supply, fill #0
  Filled 2021-07-11: qty 180, 60d supply, fill #1

## 2021-05-15 ENCOUNTER — Ambulatory Visit (INDEPENDENT_AMBULATORY_CARE_PROVIDER_SITE_OTHER): Payer: 59

## 2021-05-15 ENCOUNTER — Other Ambulatory Visit: Payer: Self-pay

## 2021-05-15 VITALS — BP 122/80 | HR 51 | Temp 98.2°F | Resp 18 | Ht 64.0 in | Wt 164.2 lb

## 2021-05-15 DIAGNOSIS — K50114 Crohn's disease of large intestine with abscess: Secondary | ICD-10-CM

## 2021-05-15 DIAGNOSIS — K50119 Crohn's disease of large intestine with unspecified complications: Secondary | ICD-10-CM | POA: Diagnosis not present

## 2021-05-15 MED ORDER — SODIUM CHLORIDE 0.9 % IV SOLN
10.0000 mg/kg | Freq: Once | INTRAVENOUS | Status: AC
  Administered 2021-05-15: 10:00:00 700 mg via INTRAVENOUS
  Filled 2021-05-15: qty 70

## 2021-05-15 MED ORDER — METHYLPREDNISOLONE SODIUM SUCC 125 MG IJ SOLR: 125.0000 mg | Freq: Once | INTRAMUSCULAR | Status: DC | PRN

## 2021-05-15 MED ORDER — ACETAMINOPHEN 325 MG PO TABS: 650.0000 mg | ORAL_TABLET | Freq: Once | ORAL | Status: DC

## 2021-05-15 MED ORDER — FAMOTIDINE IN NACL 20-0.9 MG/50ML-% IV SOLN: 20.0000 mg | Freq: Once | INTRAVENOUS | Status: DC | PRN

## 2021-05-15 MED ORDER — ALBUTEROL SULFATE HFA 108 (90 BASE) MCG/ACT IN AERS: 2.0000 | INHALATION_SPRAY | Freq: Once | RESPIRATORY_TRACT | Status: DC | PRN

## 2021-05-15 MED ORDER — DIPHENHYDRAMINE HCL 25 MG PO CAPS: 25.0000 mg | ORAL_CAPSULE | Freq: Once | ORAL | Status: DC

## 2021-05-15 MED ORDER — HYDROCORTISONE NA SUCCINATE PF 100 MG IJ SOLR: 100.0000 mg | Freq: Once | INTRAMUSCULAR | Status: DC

## 2021-05-15 MED ORDER — SODIUM CHLORIDE 0.9 % IV SOLN: Freq: Once | INTRAVENOUS | Status: DC | PRN

## 2021-05-15 MED ORDER — DIPHENHYDRAMINE HCL 50 MG/ML IJ SOLN: 50.0000 mg | Freq: Once | INTRAMUSCULAR | Status: DC | PRN

## 2021-05-15 MED ORDER — EPINEPHRINE 0.3 MG/0.3ML IJ SOAJ: 0.3000 mg | Freq: Once | INTRAMUSCULAR | Status: DC | PRN

## 2021-05-15 NOTE — Progress Notes (Signed)
Diagnosis: Crohn's Disease  Provider:  Marshell Garfinkel, MD  Procedure: Infusion  IV Type: Peripheral, IV Location: L Antecubital  Infliximab (Avsola), Dose: 72m   Infusion Start Time: 01886 Infusion Stop Time: 17737 Post Infusion IV Care: Peripheral IV Discontinued  Discharge: Condition: Good, Destination: Home . AVS provided to patient.   Performed by:  Ezella Kell, LSherlon Handing LPN

## 2021-06-14 ENCOUNTER — Other Ambulatory Visit (HOSPITAL_COMMUNITY): Payer: Self-pay

## 2021-06-22 ENCOUNTER — Other Ambulatory Visit (HOSPITAL_COMMUNITY): Payer: Self-pay

## 2021-06-23 ENCOUNTER — Other Ambulatory Visit (HOSPITAL_COMMUNITY): Payer: Self-pay

## 2021-06-26 ENCOUNTER — Ambulatory Visit (INDEPENDENT_AMBULATORY_CARE_PROVIDER_SITE_OTHER): Payer: 59

## 2021-06-26 ENCOUNTER — Other Ambulatory Visit: Payer: Self-pay

## 2021-06-26 ENCOUNTER — Other Ambulatory Visit: Payer: Self-pay | Admitting: Pharmacy Technician

## 2021-06-26 VITALS — BP 129/69 | HR 62 | Temp 98.9°F | Resp 16 | Ht 64.0 in | Wt 161.0 lb

## 2021-06-26 DIAGNOSIS — K50114 Crohn's disease of large intestine with abscess: Secondary | ICD-10-CM

## 2021-06-26 DIAGNOSIS — K50119 Crohn's disease of large intestine with unspecified complications: Secondary | ICD-10-CM

## 2021-06-26 MED ORDER — HYDROCORTISONE NA SUCCINATE PF 100 MG IJ SOLR
100.0000 mg | Freq: Once | INTRAMUSCULAR | Status: AC
  Administered 2021-06-26: 100 mg via INTRAVENOUS
  Filled 2021-06-26: qty 2

## 2021-06-26 MED ORDER — DIPHENHYDRAMINE HCL 50 MG/ML IJ SOLN: 50.0000 mg | Freq: Once | INTRAMUSCULAR | Status: DC | PRN

## 2021-06-26 MED ORDER — METHYLPREDNISOLONE SODIUM SUCC 125 MG IJ SOLR: 125.0000 mg | Freq: Once | INTRAMUSCULAR | Status: DC | PRN

## 2021-06-26 MED ORDER — FAMOTIDINE IN NACL 20-0.9 MG/50ML-% IV SOLN: 20.0000 mg | Freq: Once | INTRAVENOUS | Status: DC | PRN

## 2021-06-26 MED ORDER — ACETAMINOPHEN 325 MG PO TABS: 650.0000 mg | ORAL_TABLET | Freq: Once | ORAL | Status: DC

## 2021-06-26 MED ORDER — SODIUM CHLORIDE 0.9 % IV SOLN
10.0000 mg/kg | Freq: Once | INTRAVENOUS | Status: AC
  Administered 2021-06-26: 700 mg via INTRAVENOUS
  Filled 2021-06-26: qty 70

## 2021-06-26 MED ORDER — DIPHENHYDRAMINE HCL 25 MG PO CAPS: 25.0000 mg | ORAL_CAPSULE | Freq: Once | ORAL | Status: DC

## 2021-06-26 MED ORDER — ALBUTEROL SULFATE HFA 108 (90 BASE) MCG/ACT IN AERS: 2.0000 | INHALATION_SPRAY | Freq: Once | RESPIRATORY_TRACT | Status: DC | PRN

## 2021-06-26 MED ORDER — EPINEPHRINE 0.3 MG/0.3ML IJ SOAJ: 0.3000 mg | Freq: Once | INTRAMUSCULAR | Status: DC | PRN

## 2021-06-26 MED ORDER — SODIUM CHLORIDE 0.9 % IV SOLN: Freq: Once | INTRAVENOUS | Status: DC | PRN

## 2021-06-26 NOTE — Progress Notes (Signed)
Diagnosis: Crohn's Disease  Provider:  Marshell Garfinkel, MD  Procedure: Infusion  IV Type: Peripheral, IV Location: L Forearm  Infliximab (Avsola), Dose: 700 mg  Infusion Start Time: 09.36 06/26/2021  Infusion Stop Time: 11.58 06/26/2021  Post Infusion IV Care: Peripheral IV Discontinued  Discharge: Condition: Good, Destination: Home . AVS provided to patient.   Performed by:  Arnoldo Morale, RN

## 2021-06-28 ENCOUNTER — Other Ambulatory Visit (HOSPITAL_COMMUNITY): Payer: Self-pay

## 2021-07-11 ENCOUNTER — Other Ambulatory Visit (HOSPITAL_COMMUNITY): Payer: Self-pay

## 2021-07-20 ENCOUNTER — Encounter: Payer: Self-pay | Admitting: Gastroenterology

## 2021-07-20 ENCOUNTER — Other Ambulatory Visit (HOSPITAL_COMMUNITY): Payer: Self-pay

## 2021-08-01 ENCOUNTER — Telehealth: Payer: Self-pay | Admitting: Pharmacy Technician

## 2021-08-01 NOTE — Telephone Encounter (Addendum)
Avsola co-pay card: ? ?Approved 07/31/21 - 08/01/22 ?ID# [id number] ?CARD ID# [id number] - EXP 3/25 ?CVS: [id number] ?RX: [id number] ?PCN: [id number] ?GR: [id number] ?MAX: 15,000/YRS ?Will need to re-enroll every year. ?Phone: [phone] ?Fax: [phone] ?

## 2021-08-07 ENCOUNTER — Ambulatory Visit (INDEPENDENT_AMBULATORY_CARE_PROVIDER_SITE_OTHER): Payer: 59

## 2021-08-07 ENCOUNTER — Other Ambulatory Visit: Payer: Self-pay

## 2021-08-07 VITALS — BP 144/69 | HR 56 | Temp 98.3°F | Resp 18 | Ht 64.0 in | Wt 168.6 lb

## 2021-08-07 DIAGNOSIS — K50114 Crohn's disease of large intestine with abscess: Secondary | ICD-10-CM | POA: Diagnosis not present

## 2021-08-07 DIAGNOSIS — K50119 Crohn's disease of large intestine with unspecified complications: Secondary | ICD-10-CM

## 2021-08-07 MED ORDER — ALBUTEROL SULFATE HFA 108 (90 BASE) MCG/ACT IN AERS: 2.0000 | INHALATION_SPRAY | Freq: Once | RESPIRATORY_TRACT | Status: DC | PRN

## 2021-08-07 MED ORDER — ACETAMINOPHEN 325 MG PO TABS: 650.0000 mg | ORAL_TABLET | Freq: Once | ORAL | Status: DC

## 2021-08-07 MED ORDER — HYDROCORTISONE NA SUCCINATE PF 100 MG IJ SOLR
100.0000 mg | Freq: Once | INTRAMUSCULAR | Status: AC
  Administered 2021-08-07: 100 mg via INTRAVENOUS
  Filled 2021-08-07: qty 2

## 2021-08-07 MED ORDER — DIPHENHYDRAMINE HCL 50 MG/ML IJ SOLN: 50.0000 mg | Freq: Once | INTRAMUSCULAR | Status: DC | PRN

## 2021-08-07 MED ORDER — DIPHENHYDRAMINE HCL 25 MG PO CAPS: 25.0000 mg | ORAL_CAPSULE | Freq: Once | ORAL | Status: DC

## 2021-08-07 MED ORDER — METHYLPREDNISOLONE SODIUM SUCC 125 MG IJ SOLR: 125.0000 mg | Freq: Once | INTRAMUSCULAR | Status: DC | PRN

## 2021-08-07 MED ORDER — EPINEPHRINE 0.3 MG/0.3ML IJ SOAJ: 0.3000 mg | Freq: Once | INTRAMUSCULAR | Status: DC | PRN

## 2021-08-07 MED ORDER — SODIUM CHLORIDE 0.9 % IV SOLN: Freq: Once | INTRAVENOUS | Status: DC | PRN

## 2021-08-07 MED ORDER — FAMOTIDINE IN NACL 20-0.9 MG/50ML-% IV SOLN: 20.0000 mg | Freq: Once | INTRAVENOUS | Status: DC | PRN

## 2021-08-07 MED ORDER — SODIUM CHLORIDE 0.9 % IV SOLN
10.0000 mg/kg | Freq: Once | INTRAVENOUS | Status: AC
  Administered 2021-08-07: 800 mg via INTRAVENOUS
  Filled 2021-08-07 (×2): qty 80

## 2021-08-07 NOTE — Progress Notes (Signed)
Diagnosis: Perianal Crohn's disease ? ?Provider:  Marshell Garfinkel, MD ? ?Procedure: Infusion ? ?IV Type: Peripheral, IV Location: R Antecubital ? ?Avsola (Infliximab-axxq), Dose: 852m ? ?Infusion Start Time: 09.40 ? ?Infusion Stop Time: 12.01 ? ?Post Infusion IV Care: Peripheral IV Discontinued ? ?Discharge: Condition: Good, Destination: Home . AVS provided to patient.  ? ?Performed by:  Annica Marinello, RN  ?  ?

## 2021-08-30 ENCOUNTER — Ambulatory Visit: Payer: Self-pay

## 2021-08-30 ENCOUNTER — Ambulatory Visit (INDEPENDENT_AMBULATORY_CARE_PROVIDER_SITE_OTHER): Payer: 59 | Admitting: Sports Medicine

## 2021-08-30 ENCOUNTER — Ambulatory Visit (INDEPENDENT_AMBULATORY_CARE_PROVIDER_SITE_OTHER): Payer: 59

## 2021-08-30 VITALS — BP 130/80 | HR 91 | Ht 64.0 in | Wt 172.0 lb

## 2021-08-30 DIAGNOSIS — M25551 Pain in right hip: Secondary | ICD-10-CM

## 2021-08-30 DIAGNOSIS — M1611 Unilateral primary osteoarthritis, right hip: Secondary | ICD-10-CM | POA: Diagnosis not present

## 2021-08-30 NOTE — Patient Instructions (Addendum)
Good to see you  ?Tylenol [phone] mg 2-3 times a day for pain relief  ?Hip HEP ?2 week follow up  ?

## 2021-08-30 NOTE — Progress Notes (Signed)
? ? Julie Clark D.Julie Clark ?[facility] ?[place] ?Phone: [phone] ?  ?Assessment and Plan:   ?  ?1. Right hip pain ?2. Primary osteoarthritis of right hip ?-Chronic with exacerbation, initial sports medicine visit ?- Acute flare of right hip osteoarthritis based on HPI, physical exam, x-rays ?- Patient elected for intra-articular CSI.  Tolerated well per note below ?- Patient cannot use NSAIDs due to being on chronic Eliquis. ?- Start Tylenol 500 to 1000 mg tablets 2-3 times a day for day-to-day pain relief ?-Start HEP ?- X-ray obtained in clinic.  My interpretation: No acute fracture or dislocation.  Cortical changes to bilateral femoral acetabular joints, more significant on right compared to left.  Increased cortical density inferior to greater trochanter on right ? ?Procedure: Ultrasound Guided Hip Acetabulofemoral Joint Injection ?Side: Right ?Diagnosis: Acute flare of osteoarthritis ?[place] Indication:  ?- accuracy is paramount for diagnosis ?- to ensure therapeutic efficacy or procedural success ?- to reduce procedural risk ? ?After explaining the procedure, viable alternatives, risks, and answering any questions, consent was given verbally. The site was cleaned with chlorhexidine prep. An ultrasound transducer was placed on the anterior thigh/hip.   The acetabular joint, labrum, and femoral shaft were identified.  The neurovascular structures were identified and an approach was found specifically avoiding these structures.  A steroid injection was performed under ultrasound guidance with sterile technique using 67m of 1% lidocaine without epinephrine and 40 mg of triamcinolone (KENALOG) 45mml. This was well tolerated and resulted in some relief.  Needle was removed and dressing placed and post injection instructions were given including  a discussion of likely return of pain today after the anesthetic wears off (with the possibility of worsened pain)  until the steroid starts to work in 1-3 days.   Pt was advised to call or return to clinic if these symptoms worsen or fail to improve as anticipated. ? ? ?Pertinent previous records reviewed include none ?  ?Follow Up: 2 weeks for reevaluation.  Could consider formal PT versus advanced imaging based on patient's symptoms ?  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 ?HPI:  ? ?08/30/21 ?Patient is a [age]ear old female complaining of right hip pain. Patient states that its been going on for several years but that would come and go due to weather but with in the last 6 months it has gotten worse does have chron's disease so doesn't know if that is affecting her hip. No numbness tingling, lifting the leg hurts the worst having to flex to sit getting into the car , has the most pain when she is at work, when she is at home she feels better, tylenol takes the bite out of it , no MOI , has a hx of Hemorids  ? ?Relevant Historical Information: Crohn's disease, on chronic anticoagulation with Eliquis, hypertension ? ?Additional pertinent review of systems negative. ? ? ?Current Outpatient Medications:  ?  acetaminophen (TYLENOL) 500 MG tablet, Take 500 mg by mouth every 6 (six) hours as needed., Disp: , Rfl:  ?  apixaban (ELIQUIS) 5 MG TABS tablet, Take 1 tablet (5 mg total) by mouth 2 (two) times daily., Disp: 180 tablet, Rfl: 1 ?  azaTHIOprine (IMURAN) 50 MG tablet, TAKE 3 TABLETS BY MOUTH DAILY WITH LUNCH, Disp: 180 tablet, Rfl: 1 ?  diltiazem (CARDIZEM CD) 120 MG 24 hr capsule, Take 1  capsule (120 mg total) by mouth daily., Disp: 90 capsule, Rfl: 3 ?  flecainide (TAMBOCOR) 150 MG tablet, TAKE 2 TABLETS BY MOUTH DAILY AS NEEDED (FOR ATRIAL FIBRILLATION)., Disp: 10 tablet, Rfl: 3 ?  inFLIXimab (REMICADE IV), Inject into the vein. Every 6 weeks, Disp: , Rfl:  ?  metoprolol succinate (TOPROL-XL) 25 MG 24 hr tablet, TAKE 1 TABLET BY MOUTH TWICE  DAILY, Disp: 180 tablet, Rfl: 3 ?  Polyethylene Glycol 3350 (MIRALAX PO), Take by mouth daily., Disp: , Rfl:  ?No current facility-administered medications for this visit. ? ?Facility-Administered Medications Ordered in Other Visits:  ?  6 CHG cloth bath night before surgery, , , Once **AND** 6 CHG cloth bath AM of surgery, , , Once **AND** Chlorhexidine Gluconate Cloth 2 % PADS 6 each, 6 each, Topical, Once **AND** Chlorhexidine Gluconate Cloth 2 % PADS 6 each, 6 each, Topical, Once, Michael Boston, MD  ? ?Objective:   ?  ?Vitals:  ? 08/30/21 1255  ?BP: 130/80  ?Pulse: 91  ?SpO2: 97%  ?Weight: 172 lb (78 kg)  ?Height: 5' 4"  (1.626 m)  ?  ?  ?Body mass index is 29.52 kg/m?.  ?  ?Physical Exam:   ? ?General: awake, alert, and oriented no acute distress, nontoxic ?Skin: no suspicious lesions or rashes ?Neuro:sensation intact distally with no dificits, normal muscle tone, no atrophy, strength 5/5 in all tested lower ext groups ?Psych: normal mood and affect, speech clear ? ?Right hip: ?No deformity, swelling or wasting ?ROM Flexion 80, ext 30, IR 25, ER 35 ?NTTP over the hip flexors, greater troch, glute musculature, si joint, lumbar spine ?+ log roll with FROM ?Negative FABER ?+ FADIR ?Negative Piriformis test ?  ?Gait normal  ? ? ?Electronically signed by:  ?Julie Clark D.Julie Clark ?[facility] ?1:31 PM 08/30/21 ?

## 2021-09-11 ENCOUNTER — Other Ambulatory Visit: Payer: Self-pay | Admitting: Gastroenterology

## 2021-09-11 ENCOUNTER — Other Ambulatory Visit (HOSPITAL_COMMUNITY): Payer: Self-pay

## 2021-09-11 MED ORDER — AZATHIOPRINE 50 MG PO TABS
ORAL_TABLET | ORAL | 1 refills | Status: DC
Start: 2021-09-11 — End: 2022-01-16
  Filled 2021-09-11: qty 180, 60d supply, fill #0
  Filled 2021-11-15: qty 180, 60d supply, fill #1

## 2021-09-11 NOTE — Progress Notes (Signed)
? ? Julie Clark ?[facility] ?[place] ?Phone: [phone] ?  ?Assessment and Plan:   ?  ?1. Right hip pain ?2. Primary osteoarthritis of right hip ?-Chronic with exacerbation, subsequent sports medicine visit ?- Nearly complete resolution of pain after intra-articular right hip CSI.  Patient's improvement is consistent with an acute flare of osteoarthritis.  We hope to wait a minimum of 3 months in between injections after CSI on 08/30/2021 ?- May restart all physical activity as tolerated without restrictions ?  ?Pertinent previous records reviewed include none ?  ?Follow Up: As needed if no improving or worsening of symptoms. We hope to wait a minimum of 3 months in between injections after CSI on 08/30/2021 ?  ?Subjective:   ?I, Julie Clark, am serving as a Education administrator for Doctor Julie Clark ? ?Chief Complaint: right hip pain  ?  ?HPI:  ?08/30/21 ?Patient is a 66 year old female complaining of right hip pain. Patient states that its been going on for several years but that would come and go due to weather but with in the last 6 months it has gotten worse does have chron's disease so doesn't know if that is affecting her hip. No numbness tingling, lifting the leg hurts the worst having to flex to sit getting into the car , has the most pain when she is at work, when she is at home she feels better, tylenol takes the bite out of it , no MOI , has a hx of Hemorids  ?  ?09/12/2021 ?Patient states that she is doing good, is there a particular shoe that she should be wearing , especially at work  ? ? ?Relevant Historical Information: Crohn's disease, on chronic anticoagulation with Eliquis, hypertension ? ?Additional pertinent review of systems negative. ? ? ?Current Outpatient Medications:  ?  acetaminophen (TYLENOL) 500 MG tablet, Take 500 mg by mouth every 6 (six) hours as needed., Disp: , Rfl:  ?  apixaban (ELIQUIS) 5 MG TABS tablet, Take 1 tablet  (5 mg total) by mouth 2 (two) times daily., Disp: 180 tablet, Rfl: 1 ?  azaTHIOprine (IMURAN) 50 MG tablet, TAKE 3 TABLETS BY MOUTH DAILY WITH LUNCH, Disp: 180 tablet, Rfl: 1 ?  diltiazem (CARDIZEM CD) 120 MG 24 hr capsule, Take 1 capsule (120 mg total) by mouth daily., Disp: 90 capsule, Rfl: 3 ?  inFLIXimab (REMICADE IV), Inject into the vein. Every 6 weeks, Disp: , Rfl:  ?  metoprolol succinate (TOPROL-XL) 25 MG 24 hr tablet, TAKE 1 TABLET BY MOUTH TWICE DAILY, Disp: 180 tablet, Rfl: 3 ?  Polyethylene Glycol 3350 (MIRALAX PO), Take by mouth daily., Disp: , Rfl:  ?  flecainide (TAMBOCOR) 150 MG tablet, TAKE 2 TABLETS BY MOUTH DAILY AS NEEDED (FOR ATRIAL FIBRILLATION)., Disp: 10 tablet, Rfl: 3 ?No current facility-administered medications for this visit. ? ?Facility-Administered Medications Ordered in Other Visits:  ?  6 CHG cloth bath night before surgery, , , Once **AND** 6 CHG cloth bath AM of surgery, , , Once **AND** Chlorhexidine Gluconate Cloth 2 % PADS 6 each, 6 each, Topical, Once **AND** Chlorhexidine Gluconate Cloth 2 % PADS 6 each, 6 each, Topical, Once, Michael Boston, MD  ? ?Objective:   ?  ?Vitals:  ? 09/12/21 1257  ?BP: 134/78  ?Pulse: 70  ?SpO2: 98%  ?Weight: 172 lb (78 kg)  ?Height: 5' 4"  (1.626 m)  ?  ?  ?Body mass index is 29.52 kg/m?Marland Kitchen  ?  ?  Physical Exam:   ? ?General: awake, alert, and oriented no acute distress, nontoxic ?Skin: no suspicious lesions or rashes ?Neuro:sensation intact distally with no dificits, normal muscle tone, no atrophy, strength 5/5 in all tested lower ext groups ?Psych: normal mood and affect, speech clear ? ?Right hip: ?No deformity, swelling or wasting ?ROM Flexion 90, ext 30, IR 45, ER 45 ?NTTP over the hip flexors, greater troch, glute musculature, si joint, lumbar spine ?Negative log roll with FROM ?Negative FABER ?Negative FADIR ?Negative Piriformis test ?Negative trendelenberg ?Gait normal  ? ? ?Electronically signed by:  ?Julie Clark ?[facility] ?1:11 PM 09/12/21 ?

## 2021-09-12 ENCOUNTER — Other Ambulatory Visit: Payer: Self-pay | Admitting: Internal Medicine

## 2021-09-12 ENCOUNTER — Ambulatory Visit (INDEPENDENT_AMBULATORY_CARE_PROVIDER_SITE_OTHER): Payer: 59 | Admitting: Sports Medicine

## 2021-09-12 VITALS — BP 134/78 | HR 70 | Ht 64.0 in | Wt 172.0 lb

## 2021-09-12 DIAGNOSIS — M1611 Unilateral primary osteoarthritis, right hip: Secondary | ICD-10-CM

## 2021-09-12 DIAGNOSIS — M25551 Pain in right hip: Secondary | ICD-10-CM

## 2021-09-12 DIAGNOSIS — Z1231 Encounter for screening mammogram for malignant neoplasm of breast: Secondary | ICD-10-CM

## 2021-09-12 NOTE — Patient Instructions (Addendum)
Good to see you  ?As needed follow up  ?

## 2021-09-13 ENCOUNTER — Other Ambulatory Visit (HOSPITAL_COMMUNITY): Payer: Self-pay

## 2021-09-14 ENCOUNTER — Other Ambulatory Visit (HOSPITAL_COMMUNITY): Payer: Self-pay

## 2021-09-15 ENCOUNTER — Emergency Department (HOSPITAL_COMMUNITY): Payer: 59

## 2021-09-15 ENCOUNTER — Other Ambulatory Visit: Payer: Self-pay | Admitting: Home Health

## 2021-09-15 ENCOUNTER — Encounter (HOSPITAL_COMMUNITY): Payer: Self-pay

## 2021-09-15 ENCOUNTER — Emergency Department (HOSPITAL_COMMUNITY)
Admission: EM | Admit: 2021-09-15 | Discharge: 2021-09-15 | Disposition: A | Discharge status: Home / Self Care | Payer: 59 | Attending: Emergency Medicine | Admitting: Emergency Medicine

## 2021-09-15 ENCOUNTER — Other Ambulatory Visit: Payer: Self-pay

## 2021-09-15 DIAGNOSIS — Z79899 Other long term (current) drug therapy: Secondary | ICD-10-CM | POA: Diagnosis not present

## 2021-09-15 DIAGNOSIS — I493 Ventricular premature depolarization: Secondary | ICD-10-CM | POA: Diagnosis not present

## 2021-09-15 DIAGNOSIS — I480 Paroxysmal atrial fibrillation: Secondary | ICD-10-CM

## 2021-09-15 DIAGNOSIS — I10 Essential (primary) hypertension: Secondary | ICD-10-CM | POA: Diagnosis not present

## 2021-09-15 DIAGNOSIS — Z7901 Long term (current) use of anticoagulants: Secondary | ICD-10-CM | POA: Diagnosis not present

## 2021-09-15 DIAGNOSIS — R55 Syncope and collapse: Secondary | ICD-10-CM

## 2021-09-15 DIAGNOSIS — Z91040 Latex allergy status: Secondary | ICD-10-CM | POA: Diagnosis not present

## 2021-09-15 DIAGNOSIS — I498 Other specified cardiac arrhythmias: Secondary | ICD-10-CM

## 2021-09-15 DIAGNOSIS — R5383 Other fatigue: Secondary | ICD-10-CM | POA: Insufficient documentation

## 2021-09-15 DIAGNOSIS — I499 Cardiac arrhythmia, unspecified: Secondary | ICD-10-CM | POA: Diagnosis not present

## 2021-09-15 DIAGNOSIS — R569 Unspecified convulsions: Secondary | ICD-10-CM | POA: Diagnosis not present

## 2021-09-15 DIAGNOSIS — R001 Bradycardia, unspecified: Secondary | ICD-10-CM | POA: Diagnosis not present

## 2021-09-15 DIAGNOSIS — R1111 Vomiting without nausea: Secondary | ICD-10-CM | POA: Diagnosis not present

## 2021-09-15 DIAGNOSIS — R008 Other abnormalities of heart beat: Secondary | ICD-10-CM | POA: Diagnosis not present

## 2021-09-15 LAB — CBC WITH DIFFERENTIAL/PLATELET
Abs Immature Granulocytes: 0.06 10*3/uL (ref 0.00–0.07)
Basophils Absolute: 0 10*3/uL (ref 0.0–0.1)
Basophils Relative: 0 %
Eosinophils Absolute: 0 10*3/uL (ref 0.0–0.5)
Eosinophils Relative: 0 %
HCT: 40.1 % (ref 36.0–46.0)
Hemoglobin: 13.1 g/dL (ref 12.0–15.0)
Immature Granulocytes: 1 %
Lymphocytes Relative: 4 %
Lymphs Abs: 0.4 10*3/uL — ABNORMAL LOW (ref 0.7–4.0)
MCH: 30.8 pg (ref 26.0–34.0)
MCHC: 32.7 g/dL (ref 30.0–36.0)
MCV: 94.1 fL (ref 80.0–100.0)
Monocytes Absolute: 0.9 10*3/uL (ref 0.1–1.0)
Monocytes Relative: 8 %
Neutro Abs: 9.3 10*3/uL — ABNORMAL HIGH (ref 1.7–7.7)
Neutrophils Relative %: 87 %
Platelets: 272 10*3/uL (ref 150–400)
RBC: 4.26 MIL/uL (ref 3.87–5.11)
RDW: 14.7 % (ref 11.5–15.5)
WBC: 10.7 10*3/uL — ABNORMAL HIGH (ref 4.0–10.5)
nRBC: 0 % (ref 0.0–0.2)

## 2021-09-15 LAB — URINALYSIS, ROUTINE W REFLEX MICROSCOPIC
Bacteria, UA: NONE SEEN
Bilirubin Urine: NEGATIVE
Glucose, UA: NEGATIVE mg/dL
Ketones, ur: NEGATIVE mg/dL
Leukocytes,Ua: NEGATIVE
Nitrite: NEGATIVE
Protein, ur: NEGATIVE mg/dL
Specific Gravity, Urine: 1.001 — ABNORMAL LOW (ref 1.005–1.030)
pH: 6 (ref 5.0–8.0)

## 2021-09-15 LAB — COMPREHENSIVE METABOLIC PANEL
ALT: 37 U/L (ref 0–44)
AST: 33 U/L (ref 15–41)
Albumin: 3.6 g/dL (ref 3.5–5.0)
Alkaline Phosphatase: 102 U/L (ref 38–126)
Anion gap: 7 (ref 5–15)
BUN: 15 mg/dL (ref 8–23)
CO2: 25 mmol/L (ref 22–32)
Calcium: 8.4 mg/dL — ABNORMAL LOW (ref 8.9–10.3)
Chloride: 105 mmol/L (ref 98–111)
Creatinine, Ser: 0.83 mg/dL (ref 0.44–1.00)
GFR, Estimated: >60 mL/min (ref >60)
Glucose, Bld: 110 mg/dL — ABNORMAL HIGH (ref 70–99)
Potassium: 3.5 mmol/L (ref 3.5–5.1)
Sodium: 137 mmol/L (ref 135–145)
Total Bilirubin: 0.8 mg/dL (ref 0.3–1.2)
Total Protein: 7 g/dL (ref 6.5–8.1)

## 2021-09-15 LAB — TSH: TSH: 0.589 u[IU]/mL (ref 0.350–4.500)

## 2021-09-15 LAB — TROPONIN I (HIGH SENSITIVITY)
Troponin I (High Sensitivity): 6 ng/L (ref <18)
Troponin I (High Sensitivity): 8 ng/L (ref <18)

## 2021-09-15 LAB — MAGNESIUM: Magnesium: 1.8 mg/dL (ref 1.7–2.4)

## 2021-09-15 LAB — LIPASE, BLOOD: Lipase: 33 U/L (ref 11–51)

## 2021-09-15 MED ORDER — SODIUM CHLORIDE 0.9 % IV BOLUS
500.0000 mL | Freq: Once | INTRAVENOUS | Status: AC
Start: 2021-09-15 — End: 2021-09-15
  Administered 2021-09-15: 500 mL via INTRAVENOUS

## 2021-09-15 NOTE — ED Provider Notes (Signed)
?[facility] ?Provider Note ? ? ?CSN: [id number] ?Arrival date & time: 09/15/21  1204 ? ?  ? ?History ? ?Chief Complaint  ?Patient presents with  ? Irregular Heart Beat  ? ? ?Julie Clark is a 66 y.o. female. ? ?The history is provided by the patient, a friend and medical records. No language interpreter was used.  ?Loss of Consciousness ?Episode history:  Single ?Most recent episode:  Today ?Duration:  45 seconds ?Timing:  Unable to specify ?Progression:  Resolved ?Chronicity:  New ?Witnessed: yes   ?Relieved by:  Nothing ?Worsened by:  Nothing ?Ineffective treatments:  Activity ?Associated symptoms: malaise/fatigue   ?Associated symptoms: no chest pain, no confusion, no diaphoresis, no dizziness, no fever, no focal weakness, no headaches, no nausea, no palpitations, no recent fall, no recent injury, no shortness of breath, no vomiting and no weakness  Seizures: unclear. ?Associated symptoms comment:  Fatigue ?Risk factors: no seizures   ? ?  ? ?Home Medications ?Prior to Admission medications   ?Medication Sig Start Date End Date Taking? Authorizing Provider  ?acetaminophen (TYLENOL) 500 MG tablet Take 500 mg by mouth every 6 (six) hours as needed.    [provider]  ?apixaban (ELIQUIS) 5 MG TABS tablet Take 1 tablet (5 mg total) by mouth 2 (two) times daily. 04/17/21   Camnitz, Ocie Doyne, MD  ?azaTHIOprine (IMURAN) 50 MG tablet TAKE 3 TABLETS BY MOUTH DAILY WITH LUNCH 09/11/21   Milus Banister, MD  ?diltiazem (CARDIZEM CD) 120 MG 24 hr capsule Take 1 capsule (120 mg total) by mouth daily. 09/20/20   Camnitz, Ocie Doyne, MD  ?flecainide (TAMBOCOR) 150 MG tablet TAKE 2 TABLETS BY MOUTH DAILY AS NEEDED (FOR ATRIAL FIBRILLATION). 08/16/20 08/16/21  Baldwin Jamaica, PA-C  ?inFLIXimab (REMICADE IV) Inject into the vein. Every 6 weeks    [provider]  ?metoprolol succinate (TOPROL-XL) 25 MG 24 hr tablet TAKE 1 TABLET BY MOUTH TWICE DAILY 09/07/20 09/21/21   Camnitz, Ocie Doyne, MD  ?Polyethylene Glycol 3350 (MIRALAX PO) Take by mouth daily.    [provider]  ?   ? ?Allergies    ?Latex, Lovastatin, Protonix [pantoprazole], Adhesive [tape], and Penicillins   ? ?Review of Systems   ?Review of Systems  ?Constitutional:  Positive for fatigue and malaise/fatigue. Negative for chills, diaphoresis and fever.  ?HENT:  Negative for congestion.   ?Eyes:  Negative for visual disturbance.  ?Respiratory:  Negative for cough, chest tightness, shortness of breath and wheezing.   ?Cardiovascular:  Positive for syncope. Negative for chest pain, palpitations and leg swelling.  ?Gastrointestinal:  Negative for abdominal pain, constipation, diarrhea, nausea and vomiting.  ?Genitourinary:  Negative for dysuria.  ?Musculoskeletal:  Negative for back pain, neck pain and neck stiffness.  ?Skin:  Negative for rash and wound.  ?Neurological:  Positive for syncope and light-headedness. Negative for dizziness, focal weakness, weakness, numbness and headaches. Seizures: unclear. ?Psychiatric/Behavioral:  Negative for agitation and confusion.   ?All other systems reviewed and are negative. ? ?Physical Exam ?Updated Vital Signs ?BP (!) 169/83   Pulse 70   Temp 98.9 ?F (37.2 ?C) (Oral)   Resp 15   Ht 5' 4"  (1.626 m)   Wt 78 kg   SpO2 96%   BMI 29.52 kg/m?  ?Physical Exam ?Vitals and nursing note reviewed.  ?Constitutional:   ?   General: She is not in acute distress. ?   Appearance: She is well-developed. She is not ill-appearing, toxic-appearing or  diaphoretic.  ?HENT:  ?   Head: Normocephalic and atraumatic.  ?   Nose: No congestion or rhinorrhea.  ?   Mouth/Throat:  ?   Mouth: Mucous membranes are moist.  ?   Pharynx: No oropharyngeal exudate or posterior oropharyngeal erythema.  ?Eyes:  ?   Extraocular Movements: Extraocular movements intact.  ?   Conjunctiva/sclera: Conjunctivae normal.  ?   Pupils: Pupils are equal, round, and reactive to light.  ?Cardiovascular:  ?   Rate and  Rhythm: Normal rate. Rhythm irregular.  ?   Heart sounds: Murmur heard.  ?Pulmonary:  ?   Effort: Pulmonary effort is normal. No respiratory distress.  ?   Breath sounds: Normal breath sounds.  ?Abdominal:  ?   Palpations: Abdomen is soft.  ?   Tenderness: There is no abdominal tenderness. There is no right CVA tenderness, left CVA tenderness, guarding or rebound.  ?Musculoskeletal:     ?   General: No swelling or tenderness.  ?   Cervical back: Neck supple. No tenderness.  ?   Right lower leg: No edema.  ?   Left lower leg: No edema.  ?Skin: ?   General: Skin is warm and dry.  ?   Capillary Refill: Capillary refill takes less than 2 seconds.  ?   Findings: No erythema.  ?Neurological:  ?   General: No focal deficit present.  ?   Mental Status: She is alert and oriented to person, place, and time.  ?   Sensory: No sensory deficit.  ?   Motor: No weakness.  ?Psychiatric:     ?   Mood and Affect: Mood normal.  ? ? ?ED Results / Procedures / Treatments   ?Labs ?(all labs ordered are listed, but only abnormal results are displayed) ?Labs Reviewed  ?CBC WITH DIFFERENTIAL/PLATELET - Abnormal; Notable for the following components:  ?    Result Value  ? WBC 10.7 (*)   ? Neutro Abs 9.3 (*)   ? Lymphs Abs 0.4 (*)   ? All other components within normal limits  ?COMPREHENSIVE METABOLIC PANEL - Abnormal; Notable for the following components:  ? Glucose, Bld 110 (*)   ? Calcium 8.4 (*)   ? All other components within normal limits  ?URINE CULTURE  ?MAGNESIUM  ?TSH  ?LIPASE, BLOOD  ?URINALYSIS, ROUTINE W REFLEX MICROSCOPIC  ?TROPONIN I (HIGH SENSITIVITY)  ?TROPONIN I (HIGH SENSITIVITY)  ? ? ?EKG ?EKG Interpretation ? ?Date/Time:  Friday September 15 2021 12:15:25 EDT ?Ventricular Rate:  72 ?PR Interval:  160 ?QRS Duration: 104 ?QT Interval:  402 ?QTC Calculation: 440 ?R Axis:   35 ?Text Interpretation: Sinus or ectopic atrial rhythm Ventricular bigeminy Borderline repolarization abnormality when compared to prior, now intermittent  bigeminy. No STEMI Confirmed by Antony Blackbird [phone]) on 09/15/2021 12:26:05 PM ? ?Radiology ?CT HEAD WO CONTRAST (5MM) ? ?Result Date: 09/15/2021 ?CLINICAL DATA:  Syncopal episode. New onset seizure. Fell to ground and hit head. EXAM: CT HEAD WITHOUT CONTRAST TECHNIQUE: Contiguous axial images were obtained from the base of the skull through the vertex without intravenous contrast. RADIATION DOSE REDUCTION: This exam was performed according to the departmental dose-optimization program which includes automated exposure control, adjustment of the mA and/or kV according to patient size and/or use of iterative reconstruction technique. COMPARISON:  None. FINDINGS: Brain: No evidence of intracranial hemorrhage, acute infarction, hydrocephalus, extra-axial collection, or mass lesion/mass effect. Vascular:  No hyperdense vessel or other acute findings. Skull: No evidence of fracture or other significant bone  abnormality. Sinuses/Orbits:  No acute findings. Other: None. IMPRESSION: Negative noncontrast head CT. Electronically Signed   By: Marlaine Hind M.D.   On: 09/15/2021 13:15  ? ?DG Chest Portable 1 View ? ?Result Date: 09/15/2021 ?CLINICAL DATA:  Syncope, arrhythmia, fatigue EXAM: PORTABLE CHEST 1 VIEW COMPARISON:  07/18/2015 FINDINGS: Cardiac and mediastinal contours are within normal limits given AP technique. No focal pulmonary opacity. No pleural effusion or pneumothorax. No acute osseous abnormality. IMPRESSION: No acute cardiopulmonary process. Electronically Signed   By: Merilyn Baba M.D.   On: 09/15/2021 12:54   ? ?Procedures ?Procedures  ? ? ?Medications Ordered in ED ?Medications  ?sodium chloride 0.9 % bolus 500 mL (500 mLs Intravenous New Bag/Given 09/15/21 1340)  ? ? ?ED Course/ Medical Decision Making/ A&P ?  ?                        ?Medical Decision Making ?Amount and/or Complexity of Data Reviewed ?Labs: ordered. ?Radiology: ordered. ? ? ? ?Julie Clark is a 66 y.o. female with a past medical history  significant for hypertension, hyperlipidemia, paroxysmal atrial fibrillation on Eliquis therapy, and Crohn's who presents for arrhythmia and unresponsive episode today.  According to patient, she is a Marine scientist workin

## 2021-09-15 NOTE — Discharge Instructions (Addendum)
STOP the FLECAINIDE.  If you develop recurrent symptoms or develop any other new/concerning symptoms such as chest pain, shortness of breath, etc. then return to the ER or call 911. ?

## 2021-09-15 NOTE — Progress Notes (Signed)
Echo ordered, follow up at EP clinic per Dr C ?

## 2021-09-15 NOTE — ED Notes (Signed)
Patient transported to CT 

## 2021-09-15 NOTE — ED Provider Notes (Signed)
Cardiology has seen patient and I discussed the case with them.  They recommend stopping flecainide and otherwise they will do outpatient work-up such as echo, heart monitoring, etc.  Stable for discharge from their perspective. ?  ?Sherwood Gambler, MD ?09/15/21 1644 ? ?

## 2021-09-15 NOTE — Consult Note (Addendum)
?Cardiology Consultation:  ? ?Patient ID: Julie Clark ?MRN: [id number]; DOB: [birth year] ? ?Admit date: 09/15/2021 ?Date of Consult: 09/15/2021 ? ?PCP:  Janith Lima, MD ?  ?[facility] Providers ?Cardiologist:  Will Meredith Leeds, MD   { ? ? ?Patient Profile:  ? ?GIULIANNA Clark is a 66 y.o. female with a hx of HTN, HLD, paroxysmal A fib, Crohn's disease with severe perianal disease immunomodulators, arthritis, who is being seen 09/15/2021 for the evaluation of syncope at the request of Dr Sherry Ruffing. ? ?History of Present Illness:  ? ?Ms. Julie Clark with above PMH who presented to ER for evaluation of syncope.  ? ?She follows Dr. Curt Bears for paroxysmal atrial fibrillation, was initially diagnosed with atrial fibrillation 06/2016 with low blood pressure.  She converted to sinus rhythm receiving metoprolol.  She had recurrent atrial fibrillation 12/2016 where she was placed on metoprolol and diltiazem, she did convert to sinus rhythm again.  03/2017 expressed preference to not take a daily medication, where she was started on a flecainide as a pill in the pocket dose at 300 mg.  She is historically anticoagulated with Eliquis with a CHA2DS2-VASc score of 2.  She was last seen in the office by Ms. Julie Clark on 08/16/2020, was doing well with atrial fibrillation, and continued flecainide pill in the pocket.  ? ?Patient presented to the ER today for evaluation of an unresponsive episode.  Patient works as a Marine scientist for [employer].  She was feeling fatigued and drained thought she was in atrial fibrillation this morning.  She took her scheduled metoprolol and felt she is out of A fib (taking longer than usual).  When she arrived work, her colleague took an EKG and found her in bigeminy (unknown underlying rhythm).  She said a doctor checked on her and told her she was in A fib. She took her flecainide (only 1 tablet although she was instructed to take 2). She believed she was out of A fib 1 hour later.  While she was  charting, she had an episode that she was sitting and slumped over.  Reportedly (by witness) she was unresponsive for 30 to 45 seconds, had some light twitching motion.  She did lost control of her bladder afterwards.  She regained consciousness quickly but does not remember what exactly happened.  She was transferred to the ER for evaluation.  At ED, reportedly she was having episodes of intermittent bigeminy with heart rate at 30s per ED provider reports. She denied ever having any chest pain, SOB, dizziness, orthopnea, PND, leg edema. She states her A fib has been controlled well over the past year.  ? ?ER work-up including CBC differential with left shift, CMP grossly unremarkable.  High sensitive troponin negative x1.  TSH WNL. CT brain negative. CXR showed no acute findings. EKG showed sinus rhythm with ventricular rate of 72, ventricular bigeminy noted.  She is hemodynamically stable at ED.  Cardiology is consulted for bradycardia and syncope.  ? ? ?Past Medical History:  ?Diagnosis Date  ? Anticoagulant long-term use   ? eliquis, managed by cardiology  ? Crohn's disease of perianal region [facility])   ? new dx 05/ 2019 by dr Ardis Hughs ([facility])  ? Heart murmur   ? Hemorrhoids   ? internal and external  ? History of hidradenitis suppurativa   ? axilla  ? Hyperlipidemia   ? Hypertension   ? PAF (paroxysmal atrial fibrillation) ([facility])   ? cardiologist-  dr Curt Bears--  first dx  07-18-2015  ? Rectal pain   ? Uterine fibroid   ? per MRI 09-16-2017  ? Wears glasses   ? Wears glasses   ? ? ?Past Surgical History:  ?Procedure Laterality Date  ? CESAREAN SECTION  x2  last one 1996  ? COLONOSCOPY    ? COLONOSCOPY WITH PROPOFOL  last one 05-14-2018  dr d. Ardis Hughs @ [facility]  ? INCISION AND DRAINAGE ABSCESS N/A 10/11/2017  ? Procedure: DRAINAGE OF PERIRECTAL ABSCESSES;  Surgeon: Michael Boston, MD;  Location: [facility];  Service: General;  Laterality: N/A;  ? [place] N/A 10/11/2017  ? Procedure:  PLACEMENT OF SETON;  Surgeon: Michael Boston, MD;  Location: [facility];  Service: General;  Laterality: N/A;  ? PLACEMENT OF SETON N/A 01/23/2019  ? Procedure: PLACEMENT OF SETONS x4;  Surgeon: Ileana Roup, MD;  Location[facility];  Service: General;  Laterality: N/A;  ? RECTAL EXAM UNDER ANESTHESIA N/A 10/11/2017  ? Procedure: ANORECTAL EXAM UNDER ANESTHESIA;  Surgeon: Michael Boston, MD;  Location: [facility];  Service: General;  Laterality: N/A;  ? RECTAL EXAM UNDER ANESTHESIA N/A 01/23/2019  ? Procedure: ANORECTAL EXAM UNDER ANESTHESIA,  INCISION AND DRAINAGE x2;  Surgeon: Ileana Roup, MD;  Location: [facility];  Service: General;  Laterality: N/A;  ? TRANSTHORACIC ECHOCARDIOGRAM  10/08/2012  ? ef 50-55%,  grade 1 diastolic dysfunction/  trivial MR and TR/  mild LAE  ?  ? ?Home Medications:  ?Prior to Admission medications   ?Medication Sig Start Date End Date Taking? Authorizing Provider  ?acetaminophen (TYLENOL) 500 MG tablet Take 500 mg by mouth every 6 (six) hours as needed.    [provider]  ?apixaban (ELIQUIS) 5 MG TABS tablet Take 1 tablet (5 mg total) by mouth 2 (two) times daily. 04/17/21   Camnitz, Ocie Doyne, MD  ?azaTHIOprine (IMURAN) 50 MG tablet TAKE 3 TABLETS BY MOUTH DAILY WITH LUNCH 09/11/21   Milus Banister, MD  ?diltiazem (CARDIZEM CD) 120 MG 24 hr capsule Take 1 capsule (120 mg total) by mouth daily. 09/20/20   Camnitz, Ocie Doyne, MD  ?flecainide (TAMBOCOR) 150 MG tablet TAKE 2 TABLETS BY MOUTH DAILY AS NEEDED (FOR ATRIAL FIBRILLATION). 08/16/20 08/16/21  Baldwin Jamaica, PA-C  ?inFLIXimab (REMICADE IV) Inject into the vein. Every 6 weeks    [provider]  ?metoprolol succinate (TOPROL-XL) 25 MG 24 hr tablet TAKE 1 TABLET BY MOUTH TWICE DAILY 09/07/20 09/21/21  Camnitz, Ocie Doyne, MD  ?Polyethylene Glycol 3350 (MIRALAX PO) Take by mouth daily.    [provider]  ? ? ?Inpatient  Medications: ?Scheduled Meds: ? ?Continuous Infusions: ? ?PRN Meds: ? ? ?Allergies:    ?Allergies  ?Allergen Reactions  ? Latex Rash  ? Lovastatin   ?  Muscle aches  ? Protonix [Pantoprazole] Diarrhea  ? Adhesive [Tape] Rash  ? Penicillins Rash  ?  Has patient had a PCN reaction causing immediate rash, facial/tongue/throat swelling, SOB or lightheadedness with hypotension: unknown ?Has patient had a PCN reaction causing severe rash involving mucus membranes or skin necrosis: No  ?Has patient had a PCN reaction that required hospitalization: No ?Has patient had a PCN reaction occurring within the last 10 years: No  ?If all of the above answers are "NO", then may proceed with Cephalosporin use. ?  ? ? ?Social History:   ?Social History  ? ?Socioeconomic History  ? Marital status: Married  ?  Spouse name: Not on file  ? Number of children: Not on file  ? Years of education: Not on file  ? Highest education level: Not on file  ?Occupational History  ? Occupation: Equities trader  ?  Employer: No Name  ?Tobacco Use  ? Smoking status: Never  ? Smokeless tobacco: Never  ?Vaping Use  ? Vaping Use: Never used  ?Substance and Sexual Activity  ? Alcohol use: No  ? Drug use: No  ? Sexual activity: Not on file  ?Other Topics Concern  ? Not on file  ?Social History Narrative  ? Married to the love of her life, Wynonia Lawman  ? ?Social Determinants of Health  ? ?Financial Resource Strain: Not on file  ?Food Insecurity: Not on file  ?Transportation Needs: Not on file  ?Physical Activity: Not on file  ?Stress: Not on file  ?Social Connections: Not on file  ?Intimate Partner Violence: Not on file  ?  ?Family History:   ? ?Family History  ?Problem Relation Age of Onset  ? Stroke Mother   ? Hypertension Mother   ? Hyperlipidemia Mother   ? Thyroid disease Mother   ? Prostate cancer Father   ? Colon cancer Maternal Aunt   ? Diabetes Neg Hx   ? Depression Neg Hx   ? Early death Neg Hx   ? Hearing loss Neg Hx   ? Heart disease Neg Hx   ?  Kidney disease Neg Hx   ? Alcohol abuse Neg Hx   ? Breast cancer Neg Hx   ? Esophageal cancer Neg Hx   ? Stomach cancer Neg Hx   ? Rectal cancer Neg Hx   ?  ? ?ROS:  ? ?Constitutional: Denied fever, chills, mala

## 2021-09-15 NOTE — ED Triage Notes (Signed)
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 Around 10am ,  doc at the clinic witnessed the Pt had seizure lasted abt 30-45 seconds, was unresponsive for a little bit. Pt is now A&O X4, NSR w EMS.  ? ?Bp 162/74 ?122 cbg  ?97% RA  ?

## 2021-09-16 LAB — URINE CULTURE

## 2021-09-18 ENCOUNTER — Ambulatory Visit: Payer: 59 | Admitting: Gastroenterology

## 2021-09-18 ENCOUNTER — Other Ambulatory Visit: Payer: Self-pay | Admitting: Pharmacy Technician

## 2021-09-18 ENCOUNTER — Telehealth: Payer: Self-pay | Admitting: Physician Assistant

## 2021-09-18 ENCOUNTER — Ambulatory Visit (INDEPENDENT_AMBULATORY_CARE_PROVIDER_SITE_OTHER): Payer: 59

## 2021-09-18 ENCOUNTER — Encounter: Payer: Self-pay | Admitting: Gastroenterology

## 2021-09-18 VITALS — BP 139/81 | HR 58 | Temp 98.3°F | Resp 16 | Ht 64.0 in | Wt 161.4 lb

## 2021-09-18 VITALS — BP 142/62 | HR 64 | Ht 64.0 in | Wt 162.0 lb

## 2021-09-18 DIAGNOSIS — K50119 Crohn's disease of large intestine with unspecified complications: Secondary | ICD-10-CM

## 2021-09-18 DIAGNOSIS — K50114 Crohn's disease of large intestine with abscess: Secondary | ICD-10-CM

## 2021-09-18 MED ORDER — DIPHENHYDRAMINE HCL 25 MG PO CAPS: 25.0000 mg | ORAL_CAPSULE | Freq: Once | ORAL | Status: DC

## 2021-09-18 MED ORDER — ACETAMINOPHEN 325 MG PO TABS: 650.0000 mg | ORAL_TABLET | Freq: Once | ORAL | Status: DC

## 2021-09-18 MED ORDER — HYDROCORTISONE NA SUCCINATE PF 100 MG IJ SOLR: 100.0000 mg | Freq: Once | INTRAMUSCULAR | Status: DC

## 2021-09-18 MED ORDER — SODIUM CHLORIDE 0.9 % IV SOLN
10.0000 mg/kg | Freq: Once | INTRAVENOUS | Status: AC
  Administered 2021-09-18: 700 mg via INTRAVENOUS
  Filled 2021-09-18: qty 70

## 2021-09-18 NOTE — Progress Notes (Signed)
Diagnosis: Crohn's Disease ? ?Provider:  Marshell Garfinkel, MD ? ?Procedure: Infusion ? ?IV Type: Peripheral, IV Location: L Forearm ? ?Avsola (Infliximab-axxq), Dose: 756m ? ?Infusion Start Time: 18616? ?Infusion Stop Time: 1230 ? ?Post Infusion IV Care: Peripheral IV Discontinued ? ?Discharge: Condition: Good, Destination: Home . AVS declined. ? ?Performed by:  CKoren Shiver RN  ?  ?

## 2021-09-18 NOTE — Progress Notes (Signed)
Review of pertinent gastrointestinal problems: 66. Crohn's disease: Crohn's colitis with severe perianal disease.  No evidence of small bowel disease on 09/2017 CT enterography.  Colonoscopy 09/2017 showed normal terminal ileum, patchy deep ulcers segmentally throughout her colon, severe perianal disease with fistulas and early abscess that was treated by oral Abx, debridement, seton placement 09/2017, Dr. Johney Maine.  Colonoscopy 12/2012 for average risk screening; incidental inflammation right colon (path suggested possible IBD) was attributed to NSAID use at the time.  Colonoscopy December 2021 was much improved.  Her terminal ileum appeared normal.  Her perianal disease was significantly improved so much so that I removed her last seton at the time of the colonoscopy.  There was a single site of inflammation in her a sending colon that was fairly focal mild, the lumen was somewhat strictured at that site.  The colon mucosa was otherwise normal right and left colon without obvious inflammation.  The terminal ileum was normal.  Pathology showed no sign of active inflammation except for a single small "inflammatory polyp which I had removed. ?Labs 09/2017: TB quant gold negative, Hepatitis B Surface Ab negative, Surface Ag negative, Hep C Ab negative; TPMT activity level NORMAL. ?TB quant gold 02/2021 negative ?Dual therapy with immunomodulators (azathiaprine) and biologic (humira) started 09/2017.  Poor response of perianal disease led to drug level testing (01/2018) adalimumab was 5.6 (low) at trough, no antibodies detected.  Humira increased to 25m every other week.  Continued poor perianal response led to repeat drug testing 03/2018) adalimumab trough level was 6.8 (still lower than ideal) with no antibodies detected.  03/2018 Changed humira dosing to 447monce weekly, also increased azathiaprine to 15041mnce daily.   2020 despite 40 mg once weekly Humira her drug levels were lower than acceptable.  Changed to 5 mg/kg  Remicade 2020. ?Remicade drug levels 08/2018: suboptimal (1.3mc63mL at 5mg/74mremicade).  ADA undetectable.  Increased remicade to 10mg/47m?10 mg/kg Remicade drug levels still low, requested 10 mg/kg Remicade every 6 weeks however this was denied by her insurance company.  Formal appeals process started in late August 2020.  Eventually this was OK'd by her insurance company ?Exam under anesthesia 01/23/2019 with Dr. ChristNadeen Landausion and drainage of 2 small separate abscess cavities perianal.  Also placement of 4 separate, new perianal setons. ?Changing from brand name Remicade to brand-name avsola (still generic infliximab), October 2022 ? ?HPI: ?This is a very pleasant 66 yea31old woman whom I work with upstairs in the [facility]. ?[place] had a syncopal event 3 or 4 days ago that was almost certainly related to the new medicine flecainide.  She has seen cardiologist.  She did have emergency room visit and CBC and complete metabolic profile were both essentially normal. ? ?She has 1 or 2 soft semiformed stools daily.  She has no bleeding.  She has no abdominal pains. ? ?She is very good about getting her infliximab 10 mg/kg every 6 weeks and she is very good about staying on her azathioprine 150 mg daily ? ? ?ROS: complete GI ROS as described in HPI, all other review negative. ? ?Constitutional:  No unintentional weight loss ? ? ?Past Medical History:  ?Diagnosis Date  ? Anticoagulant long-term use   ? eliquis, managed by cardiology  ? Crohn's disease of perianal region [facility]  ? Heart murmur   ? Hemorrhoids   ? internal and external  ? History of hidradenitis suppurativa   ?  axilla  ? Hyperlipidemia   ? Hypertension   ? PAF (paroxysmal atrial fibrillation) ([place])   ? cardiologist-  dr Curt Bears--  first dx 07-18-2015  ? Rectal pain   ? Uterine fibroid   ? per MRI 09-16-2017  ? Wears glasses   ? Wears glasses   ? ? ?Past Surgical History:  ?Procedure Laterality Date  ?  CESAREAN SECTION  x2  last one 1996  ? COLONOSCOPY    ? COLONOSCOPY WITH PROPOFOL  last one 05-14-2018  dr d. Ardis Hughs @ [facility]  ? INCISION AND DRAINAGE ABSCESS N/A 10/11/2017  ? Procedure: DRAINAGE OF PERIRECTAL ABSCESSES;  Surgeon: Michael Boston, MD;  Location: [facility];  Service: General;  Laterality: N/A;  ? [place] N/A 10/11/2017  ? Procedure: PLACEMENT OF SETON;  Surgeon: Michael Boston, MD;  Location: [facility];  Service: General;  Laterality: N/A;  ? PLACEMENT OF SETON N/A 01/23/2019  ? Procedure: PLACEMENT OF SETONS x4;  Surgeon: Ileana Roup, MD;  Location: [facility];  Service: General;  Laterality: N/A;  ? RECTAL EXAM UNDER ANESTHESIA N/A 10/11/2017  ? Procedure: ANORECTAL EXAM UNDER ANESTHESIA;  Surgeon: Michael Boston, MD;  Location: [facility];  Service: General;  Laterality: N/A;  ? RECTAL EXAM UNDER ANESTHESIA N/A 01/23/2019  ? Procedure: ANORECTAL EXAM UNDER ANESTHESIA,  INCISION AND DRAINAGE x2;  Surgeon: Ileana Roup, MD;  Location: [place];  Service: General;  Laterality: N/A;  ? TRANSTHORACIC ECHOCARDIOGRAM  10/08/2012  ? ef 50-55%,  grade 1 diastolic dysfunction/  trivial MR and TR/  mild LAE  ? ? ?Current Outpatient Medications  ?Medication Instructions  ? acetaminophen (TYLENOL) 500 mg, Oral, Every 6 hours PRN  ? azaTHIOprine (IMURAN) 50 MG tablet TAKE 3 TABLETS BY MOUTH DAILY WITH LUNCH  ? diltiazem (CARDIZEM CD) 120 mg, Oral, Daily  ? Eliquis 5 mg, Oral, 2 times daily  ? inFLIXimab (REMICADE IV) Intravenous, Every 6 weeks  ? metoprolol succinate (TOPROL-XL) 25 MG 24 hr tablet TAKE 1 TABLET BY MOUTH TWICE DAILY  ? Polyethylene Glycol 3350 (MIRALAX PO) 1 Package, Oral, Daily, Mix powder with liquid and take once daily  ? ? ?Allergies as of 09/18/2021 - Review Complete 09/18/2021  ?Allergen Reaction Noted  ? Latex Rash 10/10/2017  ? Lovastatin  08/11/2012  ? Flecainide  09/18/2021  ?  Protonix [pantoprazole] Diarrhea 10/10/2017  ? Adhesive [tape] Rash 10/10/2017  ? Penicillins Rash 08/11/2012  ? ? ?Family History  ?Problem Relation Age of Onset  ? Stroke Mother   ? Hypertension Mother   ? Hyperlipidemia Mother   ? Thyroid disease Mother   ? Prostate cancer Father   ? Colon cancer Maternal Aunt   ? Diabetes Neg Hx   ? Depression Neg Hx   ? Early death Neg Hx   ? Hearing loss Neg Hx   ? Heart disease Neg Hx   ? Kidney disease Neg Hx   ? Alcohol abuse Neg Hx   ? Breast cancer Neg Hx   ? Esophageal cancer Neg Hx   ? Stomach cancer Neg Hx   ? Rectal cancer Neg Hx   ? ? ?Social History  ? ?Socioeconomic History  ? Marital status: Married  ?  Spouse name: Not on file  ? Number of children: Not on file  ? Years of education: Not on file  ? Highest education level: Not on file  ?Occupational History  ? Occupation: registered  nurse  ?  Employer: [employer]  ?Tobacco Use  ? Smoking status: Never  ? Smokeless tobacco: Never  ?Vaping Use  ? Vaping Use: Never used  ?Substance and Sexual Activity  ? Alcohol use: No  ? Drug use: No  ? Sexual activity: Not on file  ?Other Topics Concern  ? Not on file  ?Social History Narrative  ? Married to the love of her life, Wynonia Lawman  ? ?Social Determinants of Health  ? ?Financial Resource Strain: Not on file  ?Food Insecurity: Not on file  ?Transportation Needs: Not on file  ?Physical Activity: Not on file  ?Stress: Not on file  ?Social Connections: Not on file  ?Intimate Partner Violence: Not on file  ? ? ? ?Physical Exam: ?BP (!) 142/62   Pulse 64   Ht 5' 4"  (1.626 m)   Wt 162 lb (73.5 kg)   SpO2 95%   BMI 27.81 kg/m?  ?Constitutional: generally well-appearing ?Psychiatric: alert and oriented x3 ?Abdomen: soft, nontender, nondistended, no obvious ascites, no peritoneal signs, normal bowel sounds ?No peripheral edema noted in lower extremities ? ?Assessment and plan: ?66 y.o. female with Crohn's colitis, previous severe perianal disease ? ?She is doing well on  azathioprine 50 mg daily, infliximab 10 mg/kg every 6 weeks.  She will continue that regimen for now.  She does not need any blood work or imaging studies.  She will return to see me in 6 months and sooner if needed. ? ?P

## 2021-09-18 NOTE — Patient Instructions (Addendum)
If you are age 66 or older, your body mass index should be between 23-30. Your Body mass index is 27.81 kg/m?Marland Kitchen If this is out of the aforementioned range listed, please consider follow up with your Primary Care Provider. ?________________________________________________________ ? ?The [facility] providers would like to encourage you to use [facility] to communicate with providers for non-urgent requests or questions.  Due to long hold times on the telephone, sending your provider a message by [facility] may be a faster and more efficient way to get a response.  Please allow 48 business hours for a response.  Please remember that this is for non-urgent requests.  ?_______________________________________________________ ? ?Please continue current medications. ? ?We will see you back in the office in 6 months (October 2023).  We will contact you to get this scheduled. ? ?Thank you for entrusting me with your care and choosing [facility]. ? ?Dr Ardis Hughs ? ?

## 2021-09-18 NOTE — Telephone Encounter (Signed)
Unable to leave message

## 2021-09-18 NOTE — Telephone Encounter (Signed)
New Message: ? ? ? ? ?Patient was seen in the ER on Friday(09-15-21) and was told to follow up today with Dr Maxie Better.There are no available appointments this week. Her question is , she is scheduled for an infusion today with another doctor. She wants to know if it is alright for her to have the infusion, since she had to go to the ER? ?

## 2021-09-20 ENCOUNTER — Other Ambulatory Visit: Payer: Self-pay | Admitting: Internal Medicine

## 2021-09-20 ENCOUNTER — Other Ambulatory Visit (HOSPITAL_COMMUNITY): Payer: Self-pay

## 2021-09-20 DIAGNOSIS — I480 Paroxysmal atrial fibrillation: Secondary | ICD-10-CM

## 2021-09-20 DIAGNOSIS — I10 Essential (primary) hypertension: Secondary | ICD-10-CM

## 2021-09-21 ENCOUNTER — Telehealth: Payer: Self-pay | Admitting: Internal Medicine

## 2021-09-21 ENCOUNTER — Other Ambulatory Visit (HOSPITAL_COMMUNITY): Payer: Self-pay

## 2021-09-21 ENCOUNTER — Other Ambulatory Visit: Payer: Self-pay | Admitting: Internal Medicine

## 2021-09-21 DIAGNOSIS — I10 Essential (primary) hypertension: Secondary | ICD-10-CM

## 2021-09-21 DIAGNOSIS — I480 Paroxysmal atrial fibrillation: Secondary | ICD-10-CM

## 2021-09-21 MED ORDER — METOPROLOL SUCCINATE ER 25 MG PO TB24
ORAL_TABLET | Freq: Two times a day (BID) | ORAL | 0 refills | Status: DC
  Filled 2021-09-21: qty 180, 90d supply, fill #0

## 2021-09-21 MED ORDER — CEPHALEXIN 500 MG PO CAPS
ORAL_CAPSULE | ORAL | 1 refills | Status: DC
  Filled 2021-09-21: qty 28, 7d supply, fill #0

## 2021-09-21 NOTE — Telephone Encounter (Signed)
1.Medication Requested: metoprolol succinate (TOPROL-XL) 25 MG 24 hr tablet ? ?2. Pharmacy (Name, [place], [place]): [facility] ? ?3. On Med List: Y ? ?4. Last Visit with PCP: 10-19-2020 ? ?5. Next visit date with PCP: 11-01-2021 ? ? ?Pt requesting a cb if provider does not approve refill request ?

## 2021-09-22 ENCOUNTER — Other Ambulatory Visit (HOSPITAL_COMMUNITY): Payer: Self-pay

## 2021-09-25 ENCOUNTER — Ambulatory Visit (HOSPITAL_COMMUNITY): Payer: 59 | Attending: Internal Medicine

## 2021-09-25 DIAGNOSIS — R55 Syncope and collapse: Secondary | ICD-10-CM

## 2021-09-25 LAB — ECHOCARDIOGRAM COMPLETE
Area-P 1/2: 3.31 cm2
S' Lateral: 3.4 cm

## 2021-09-27 ENCOUNTER — Other Ambulatory Visit (HOSPITAL_COMMUNITY): Payer: Self-pay

## 2021-09-27 ENCOUNTER — Other Ambulatory Visit: Payer: Self-pay | Admitting: Cardiology

## 2021-09-27 MED ORDER — DILTIAZEM HCL ER COATED BEADS 120 MG PO CP24
120.0000 mg | ORAL_CAPSULE | Freq: Every day | ORAL | 0 refills | Status: DC
  Filled 2021-09-27: qty 90, 90d supply, fill #0

## 2021-10-04 ENCOUNTER — Other Ambulatory Visit (HOSPITAL_COMMUNITY): Payer: Self-pay

## 2021-10-05 NOTE — Progress Notes (Signed)
? ?PCP:  Janith Lima, MD ?Primary Cardiologist: Will Meredith Leeds, MD ?Electrophysiologist: Will Meredith Leeds, MD  ? ?Julie Clark is a 66 y.o. female seen today for Will Meredith Leeds, MD for post hospital follow up.  ? ?Seen in ED 4/21/2023for evaluation of an unresponsive episode. She was feeling fatigued and drained and thought she was in atrial fibrillation this morning.  She took her scheduled metoprolol with minimal relief. At work, EKG showed bigeminy (unknown underlying rhythm).  She said a doctor checked on her and told her she was in A fib. She took her flecainide (only 1 tablet although she was instructed to take 2). She felt her HR was normal an hour later. While she was charting, she had an episode that she was sitting and slumped over.  Reportedly (by witness) she was unresponsive for 30 to 45 seconds, had some light twitching motion.  She did lost control of her bladder afterwards.  She regained consciousness quickly but does not remember what exactly happened.  She was transferred to the ER for evaluation.  At ED, reportedly she was having episodes of intermittent bigeminy with heart rate at 30s per ED provider reports. She denied ever having any chest pain, SOB, dizziness, orthopnea, PND, leg edema. She states her A fib has been controlled well over the past year.  ? ?Since ER visit she has been doing well. She feels like her ectopy has calmed down significantly after she got her Echo. She denies chest pain, palpitations, dyspnea, PND, orthopnea, nausea, vomiting, dizziness, syncope, edema, weight gain, or early satiety. ? ?Past Medical History:  ?Diagnosis Date  ? Anticoagulant long-term use   ? eliquis, managed by cardiology  ? Crohn's disease of perianal region [facility])   ? new dx 05/ 2019 by dr Ardis Hughs ([facility])  ? Heart murmur   ? Hemorrhoids   ? internal and external  ? History of hidradenitis suppurativa   ? axilla  ? Hyperlipidemia   ? Hypertension   ? PAF (paroxysmal atrial  fibrillation) ([place])   ? cardiologist-  dr Curt Bears--  first dx 07-18-2015  ? Rectal pain   ? Uterine fibroid   ? per MRI 09-16-2017  ? Wears glasses   ? Wears glasses   ? ?Past Surgical History:  ?Procedure Laterality Date  ? CESAREAN SECTION  x2  last one 1996  ? COLONOSCOPY    ? COLONOSCOPY WITH PROPOFOL  last one 05-14-2018  dr d. Ardis Hughs @ [facility]  ? INCISION AND DRAINAGE ABSCESS N/A 10/11/2017  ? Procedure: DRAINAGE OF PERIRECTAL ABSCESSES;  Surgeon:  Boston, MD;  Location: [facility];  Service: General;  Laterality: N/A;  ? [place] N/A 10/11/2017  ? Procedure: PLACEMENT OF SETON;  Surgeon:  Boston, MD;  Location: [facility];  Service: General;  Laterality: N/A;  ? PLACEMENT OF SETON N/A 01/23/2019  ? Procedure: PLACEMENT OF SETONS x4;  Surgeon: Ileana Roup, MD;  Location: [facility];  Service: General;  Laterality: N/A;  ? RECTAL EXAM UNDER ANESTHESIA N/A 10/11/2017  ? Procedure: ANORECTAL EXAM UNDER ANESTHESIA;  Surgeon:  Boston, MD;  Location: [facility];  Service: General;  Laterality: N/A;  ? RECTAL EXAM UNDER ANESTHESIA N/A 01/23/2019  ? Procedure: ANORECTAL EXAM UNDER ANESTHESIA,  INCISION AND DRAINAGE x2;  Surgeon: Ileana Roup, MD;  Location: [place];  Service: General;  Laterality: N/A;  ? TRANSTHORACIC ECHOCARDIOGRAM  10/08/2012  ? ef  50-55%,  grade 1 diastolic dysfunction/  trivial MR and TR/  mild LAE  ? ? ?Current Outpatient Medications  ?Medication Sig Dispense Refill  ? acetaminophen (TYLENOL) 500 MG tablet Take 500 mg by mouth every 6 (six) hours as needed.    ? apixaban (ELIQUIS) 5 MG TABS tablet Take 1 tablet (5 mg total) by mouth 2 (two) times daily. 180 tablet 1  ? azaTHIOprine (IMURAN) 50 MG tablet TAKE 3 TABLETS BY MOUTH DAILY WITH LUNCH 180 tablet 1  ? cephALEXin (KEFLEX) 500 MG capsule Take 4 capsules by mouth 1 hour before appointment 28 capsule 1  ? diltiazem  (CARDIZEM CD) 120 MG 24 hr capsule Take 1 capsule (120 mg total) by mouth daily. 90 capsule 0  ? inFLIXimab (REMICADE IV) Inject into the vein. Every 6 weeks    ? metoprolol succinate (TOPROL-XL) 25 MG 24 hr tablet TAKE 1 TABLET BY MOUTH TWICE DAILY 180 tablet 0  ? Polyethylene Glycol 3350 (MIRALAX PO) Take 1 Package by mouth daily. Mix powder with liquid and take once daily    ? ?No current facility-administered medications for this visit.  ? ?Facility-Administered Medications Ordered in Other Visits  ?Medication Dose Route Frequency Provider Last Rate Last Admin  ? Chlorhexidine Gluconate Cloth 2 % PADS 6 each  6 each Topical Once  Boston, MD      ? And  ? Chlorhexidine Gluconate Cloth 2 % PADS 6 each  6 each Topical Once  Boston, MD      ? ? ?Allergies  ?Allergen Reactions  ? Latex Rash  ? Lovastatin   ?  Muscle aches  ? Flecainide   ? Protonix [Pantoprazole] Diarrhea  ? Adhesive [Tape] Rash  ? Penicillins Rash  ?  Has patient had a PCN reaction causing immediate rash, facial/tongue/throat swelling, SOB or lightheadedness with hypotension: unknown ?Has patient had a PCN reaction causing severe rash involving mucus membranes or skin necrosis: No  ?Has patient had a PCN reaction that required hospitalization: No ?Has patient had a PCN reaction occurring within the last 10 years: No  ?If all of the above answers are "NO", then may proceed with Cephalosporin use. ?  ? ? ?Social History  ? ?Socioeconomic History  ? Marital status: Married  ?  Spouse name: Not on file  ? Number of children: Not on file  ? Years of education: Not on file  ? Highest education level: Not on file  ?Occupational History  ? Occupation: Equities trader  ?  Employer: [employer]  ?Tobacco Use  ? Smoking status: Never  ? Smokeless tobacco: Never  ?Vaping Use  ? Vaping Use: Never used  ?Substance and Sexual Activity  ? Alcohol use: No  ? Drug use: No  ? Sexual activity: Not on file  ?Other Topics Concern  ? Not on file  ?Social  History Narrative  ? Married to the love of her life, Wynonia Lawman  ? ?Social Determinants of Health  ? ?Financial Resource Strain: Not on file  ?Food Insecurity: Not on file  ?Transportation Needs: Not on file  ?Physical Activity: Not on file  ?Stress: Not on file  ?Social Connections: Not on file  ?Intimate Partner Violence: Not on file  ? ? ? ?Review of Systems: ?All other systems reviewed and are otherwise negative except as noted above. ? ?Physical Exam: ?There were no vitals filed for this visit. ? ?GEN- The patient is well appearing, alert and oriented x 3 today.   ?HEENT: normocephalic, atraumatic; sclera  clear, conjunctiva pink; hearing intact; oropharynx clear; neck supple, no JVP ?Lymph- no cervical lymphadenopathy ?Lungs- Clear to ausculation bilaterally, normal work of breathing.  No wheezes, rales, rhonchi ?Heart- Regular rate and rhythm, no murmurs, rubs or gallops, PMI not laterally displaced ?GI- soft, non-tender, non-distended, bowel sounds present, no hepatosplenomegaly ?Extremities- no clubbing, cyanosis, or edema; DP/PT/radial pulses 2+ bilaterally ?MS- no significant deformity or atrophy ?Skin- warm and dry, no rash or lesion ?Psych- euthymic mood, full affect ?Neuro- strength and sensation are intact ? ?EKG is not ordered. Personal review of EKG from  09/15/2021  shows NSR at 72 with PVCs in quadrigeminy and bigeminy ? ?Additional studies reviewed include: ?Previous EP office notes.  ? ?Assessment and Plan: ? ?1. Syncope ?Unclear circumstances as above but occurred after taking flecainide that am, which she had not taken in some time.  ?Give the prolonged time it is unlikely she had a pause significant enough to cause her to be unresponsive for "30-45" seconds, she also lost bladder control.  ?No further syncope, no near syncope.  ?Consider monitor if recurs.  ? ?2. Paroxysmal atrial fibrillation ?Uses "pill in pocket" flecainide. Had not needed until very recently.  ?Instructed not to use any  flecainide for the time being.  ?Can consider ablation in the future if burden increases.  ? ?3. PVCs ?Frequent by recent EKGs and during echo.  ?Rare on exam today.  ?She is feeling well, and declines monitor today. C

## 2021-10-06 ENCOUNTER — Other Ambulatory Visit (HOSPITAL_COMMUNITY): Payer: Self-pay

## 2021-10-06 ENCOUNTER — Encounter: Payer: Self-pay | Admitting: Student

## 2021-10-06 ENCOUNTER — Ambulatory Visit: Payer: 59 | Admitting: Student

## 2021-10-06 DIAGNOSIS — I480 Paroxysmal atrial fibrillation: Secondary | ICD-10-CM

## 2021-10-06 MED ORDER — APIXABAN 5 MG PO TABS
5.0000 mg | ORAL_TABLET | Freq: Two times a day (BID) | ORAL | 1 refills | Status: DC
  Filled 2021-10-06: qty 180, 90d supply, fill #0
  Filled 2022-01-16: qty 180, 90d supply, fill #1

## 2021-10-06 MED ORDER — DILTIAZEM HCL ER COATED BEADS 120 MG PO CP24
120.0000 mg | ORAL_CAPSULE | Freq: Every day | ORAL | 3 refills | Status: DC
  Filled 2021-10-06 – 2021-12-20 (×2): qty 90, 90d supply, fill #0
  Filled 2022-03-20: qty 90, 90d supply, fill #1
  Filled 2022-06-26: qty 90, 90d supply, fill #2

## 2021-10-06 NOTE — Patient Instructions (Signed)
Medication Instructions:  ?Your physician recommends that you continue on your current medications as directed. Please refer to the Current Medication list given to you today. ? ?*If you need a refill on your cardiac medications before your next appointment, please call your pharmacy* ? ? ?Lab Work: ?None ?If you have labs (blood work) drawn today and your tests are completely normal, you will receive your results only by: ?MyChart Message (if you have MyChart) OR ?A paper copy in the mail ?If you have any lab test that is abnormal or we need to change your treatment, we will call you to review the results. ? ? ?Follow-Up: ?At [facility], you and your health needs are our priority.  As part of our continuing mission to provide you with exceptional heart care, we have created designated Provider Care Teams.  These Care Teams include your primary Cardiologist (physician) and Advanced Practice Providers (APPs -  Physician Assistants and Nurse Practitioners) who all work together to provide you with the care you need, when you need it. ? ?Your next appointment:   ?3 month(s) ? ?The format for your next appointment:   ?In Person ? ?Provider:   ?Allegra Lai, MD{ ?  ?

## 2021-10-18 ENCOUNTER — Other Ambulatory Visit (HOSPITAL_COMMUNITY): Payer: Self-pay

## 2021-10-18 ENCOUNTER — Encounter: Payer: Self-pay | Admitting: Internal Medicine

## 2021-10-18 ENCOUNTER — Telehealth: Payer: 59 | Admitting: Physician Assistant

## 2021-10-18 DIAGNOSIS — B9689 Other specified bacterial agents as the cause of diseases classified elsewhere: Secondary | ICD-10-CM | POA: Diagnosis not present

## 2021-10-18 DIAGNOSIS — J208 Acute bronchitis due to other specified organisms: Secondary | ICD-10-CM | POA: Diagnosis not present

## 2021-10-18 DIAGNOSIS — Z91199 Patient's noncompliance with other medical treatment and regimen due to unspecified reason: Secondary | ICD-10-CM

## 2021-10-18 MED ORDER — AZITHROMYCIN 250 MG PO TABS
ORAL_TABLET | ORAL | 0 refills | Status: AC
  Filled 2021-10-18: qty 6, 5d supply, fill #0

## 2021-10-18 MED ORDER — BENZONATATE 100 MG PO CAPS
100.0000 mg | ORAL_CAPSULE | Freq: Three times a day (TID) | ORAL | 0 refills | Status: DC | PRN
  Filled 2021-10-18: qty 30, 10d supply, fill #0

## 2021-10-18 NOTE — Progress Notes (Signed)
The patient no-showed for appointment despite this provider sending direct link, reaching out via phone with no response and waiting for at least 10 minutes from appointment time for patient to join. They will be marked as a NS for this appointment/time.  ? ?Jahne Krukowski Cody Emillio Ngo, PA-C ? ? ? ?

## 2021-10-18 NOTE — Progress Notes (Signed)
We are sorry that you are not feeling well.  Here is how we plan to help!  Based on your presentation I believe you most likely have A cough due to bacteria.  When patients have a fever and a productive cough with a change in color or increased sputum production, we are concerned about bacterial bronchitis.  If left untreated it can progress to pneumonia.  If your symptoms do not improve with your treatment plan it is important that you contact your provider.   I have prescribed Azithromyin 250 mg: two tablets now and then one tablet daily for 4 additonal days    In addition you may use A prescription cough medication called Tessalon Perles 139m. You may take 1-2 capsules every 8 hours as needed for your cough.   From your responses in the eVisit questionnaire you describe inflammation in the upper respiratory tract which is causing a significant cough.  This is commonly called Bronchitis and has four common causes:   Allergies Viral Infections Acid Reflux Bacterial Infection Allergies, viruses and acid reflux are treated by controlling symptoms or eliminating the cause. An example might be a cough caused by taking certain blood pressure medications. You stop the cough by changing the medication. Another example might be a cough caused by acid reflux. Controlling the reflux helps control the cough.  USE OF BRONCHODILATOR ("RESCUE") INHALERS: There is a risk from using your bronchodilator too frequently.  The risk is that over-reliance on a medication which only relaxes the muscles surrounding the breathing tubes can reduce the effectiveness of medications prescribed to reduce swelling and congestion of the tubes themselves.  Although you feel brief relief from the bronchodilator inhaler, your asthma may actually be worsening with the tubes becoming more swollen and filled with mucus.  This can delay other crucial treatments, such as oral steroid medications. If you need to use a bronchodilator  inhaler daily, several times per day, you should discuss this with your provider.  There are probably better treatments that could be used to keep your asthma under control.     HOME CARE Only take medications as instructed by your medical team. Complete the entire course of an antibiotic. Drink plenty of fluids and get plenty of rest. Avoid close contacts especially the very young and the elderly Cover your mouth if you cough or cough into your sleeve. Always remember to wash your hands A steam or ultrasonic humidifier can help congestion.   GET HELP RIGHT AWAY IF: You develop worsening fever. You become short of breath You cough up blood. Your symptoms persist after you have completed your treatment plan MAKE SURE YOU  Understand these instructions. Will watch your condition. Will get help right away if you are not doing well or get worse.    Thank you for choosing an e-visit.  Your e-visit answers were reviewed by a board certified advanced clinical practitioner to complete your personal care plan. Depending upon the condition, your plan could have included both over the counter or prescription medications.  Please review your pharmacy choice. Make sure the pharmacy is open so you can pick up prescription now. If there is a problem, you may contact your provider through [employer] have the prescription routed to another pharmacy.  Your safety is important to [place] If you have drug allergies check your prescription carefully.   For the next 24 hours you can use MyChart to ask questions about today's visit, request a non-urgent call back, or ask  for a work or school excuse. You will get an email in the next two days asking about your experience. I hope that your e-visit has been valuable and will speed your recovery.

## 2021-10-18 NOTE — Progress Notes (Signed)
I have spent 5 minutes in review of e-visit questionnaire, review and updating patient chart, medical decision making and response to patient.   Ski Polich Cody Sky Borboa, PA-C    

## 2021-10-19 ENCOUNTER — Telehealth: Payer: 59 | Admitting: Nurse Practitioner

## 2021-10-20 ENCOUNTER — Other Ambulatory Visit: Payer: Self-pay | Admitting: Pharmacy Technician

## 2021-10-30 ENCOUNTER — Ambulatory Visit (INDEPENDENT_AMBULATORY_CARE_PROVIDER_SITE_OTHER): Payer: 59

## 2021-10-30 VITALS — BP 135/72 | HR 59 | Temp 98.2°F | Resp 18 | Wt 167.0 lb

## 2021-10-30 DIAGNOSIS — K50114 Crohn's disease of large intestine with abscess: Secondary | ICD-10-CM

## 2021-10-30 DIAGNOSIS — K50119 Crohn's disease of large intestine with unspecified complications: Secondary | ICD-10-CM | POA: Diagnosis not present

## 2021-10-30 MED ORDER — METHYLPREDNISOLONE SODIUM SUCC 40 MG IJ SOLR
40.0000 mg | Freq: Once | INTRAMUSCULAR | Status: AC
  Administered 2021-10-30: 40 mg via INTRAVENOUS
  Filled 2021-10-30: qty 1

## 2021-10-30 MED ORDER — ACETAMINOPHEN 325 MG PO TABS: 650.0000 mg | ORAL_TABLET | Freq: Once | ORAL | Status: DC

## 2021-10-30 MED ORDER — DIPHENHYDRAMINE HCL 25 MG PO CAPS: 25.0000 mg | ORAL_CAPSULE | Freq: Once | ORAL | Status: DC

## 2021-10-30 MED ORDER — SODIUM CHLORIDE 0.9 % IV SOLN
10.0000 mg/kg | Freq: Once | INTRAVENOUS | Status: AC
  Administered 2021-10-30: 800 mg via INTRAVENOUS
  Filled 2021-10-30: qty 80

## 2021-10-30 NOTE — Progress Notes (Signed)
Diagnosis: Crohn's Disease  Provider:  Marshell Garfinkel, MD  Procedure: Infusion  IV Type: Peripheral, IV Location: R Antecubital  Avsola (Infliximab-axxq), Dose: 800 mg  Infusion Start Time: 1969  Infusion Stop Time: 4098  Post Infusion IV Care: Peripheral IV Discontinued  Discharge: Condition: Good, Destination: Home . AVS provided to patient.   Performed by:  Adelina Mings, LPN

## 2021-11-01 ENCOUNTER — Other Ambulatory Visit (HOSPITAL_COMMUNITY): Payer: Self-pay

## 2021-11-01 ENCOUNTER — Ambulatory Visit (INDEPENDENT_AMBULATORY_CARE_PROVIDER_SITE_OTHER): Payer: 59 | Admitting: Internal Medicine

## 2021-11-01 ENCOUNTER — Encounter: Payer: Self-pay | Admitting: Internal Medicine

## 2021-11-01 VITALS — BP 138/74 | HR 66 | Temp 98.7°F | Ht 64.0 in | Wt 167.0 lb

## 2021-11-01 DIAGNOSIS — E538 Deficiency of other specified B group vitamins: Secondary | ICD-10-CM | POA: Diagnosis not present

## 2021-11-01 DIAGNOSIS — D500 Iron deficiency anemia secondary to blood loss (chronic): Secondary | ICD-10-CM | POA: Diagnosis not present

## 2021-11-01 DIAGNOSIS — Z0001 Encounter for general adult medical examination with abnormal findings: Secondary | ICD-10-CM | POA: Diagnosis not present

## 2021-11-01 DIAGNOSIS — I480 Paroxysmal atrial fibrillation: Secondary | ICD-10-CM

## 2021-11-01 DIAGNOSIS — E785 Hyperlipidemia, unspecified: Secondary | ICD-10-CM | POA: Diagnosis not present

## 2021-11-01 DIAGNOSIS — I10 Essential (primary) hypertension: Secondary | ICD-10-CM

## 2021-11-01 DIAGNOSIS — E519 Thiamine deficiency, unspecified: Secondary | ICD-10-CM | POA: Diagnosis not present

## 2021-11-01 DIAGNOSIS — D510 Vitamin B12 deficiency anemia due to intrinsic factor deficiency: Secondary | ICD-10-CM

## 2021-11-01 LAB — CBC WITH DIFFERENTIAL/PLATELET
Basophils Absolute: 0.1 10*3/uL (ref 0.0–0.1)
Basophils Relative: 1 % (ref 0.0–3.0)
Eosinophils Absolute: 0.2 10*3/uL (ref 0.0–0.7)
Eosinophils Relative: 2.8 % (ref 0.0–5.0)
HCT: 37.8 % (ref 36.0–46.0)
Hemoglobin: 12.4 g/dL (ref 12.0–15.0)
Lymphocytes Relative: 19 % (ref 12.0–46.0)
Lymphs Abs: 1.6 10*3/uL (ref 0.7–4.0)
MCHC: 32.9 g/dL (ref 30.0–36.0)
MCV: 92.9 fl (ref 78.0–100.0)
Monocytes Absolute: 0.8 10*3/uL (ref 0.1–1.0)
Monocytes Relative: 9 % (ref 3.0–12.0)
Neutro Abs: 5.7 10*3/uL (ref 1.4–7.7)
Neutrophils Relative %: 68.2 % (ref 43.0–77.0)
Platelets: 328 10*3/uL (ref 150.0–400.0)
RBC: 4.07 Mil/uL (ref 3.87–5.11)
RDW: 15.8 % — ABNORMAL HIGH (ref 11.5–15.5)
WBC: 8.3 10*3/uL (ref 4.0–10.5)

## 2021-11-01 LAB — VITAMIN B12: Vitamin B-12: 274 pg/mL (ref 211–911)

## 2021-11-01 LAB — IBC + FERRITIN
Ferritin: 46.4 ng/mL (ref 10.0–291.0)
Iron: 84 ug/dL (ref 42–145)
Saturation Ratios: 25.5 % (ref 20.0–50.0)
TIBC: 329 ug/dL (ref 250.0–450.0)
Transferrin: 235 mg/dL (ref 212.0–360.0)

## 2021-11-01 LAB — FOLATE: Folate: 5.1 ng/mL — ABNORMAL LOW (ref >5.9)

## 2021-11-01 MED ORDER — METOPROLOL SUCCINATE ER 25 MG PO TB24
ORAL_TABLET | Freq: Two times a day (BID) | ORAL | 1 refills | Status: DC
  Filled 2021-11-01: qty 180, fill #0
  Filled 2021-12-20: qty 180, 90d supply, fill #0
  Filled 2022-03-20: qty 180, 90d supply, fill #1

## 2021-11-01 MED ORDER — FOLIC ACID 1 MG PO TABS
1.0000 mg | ORAL_TABLET | Freq: Every day | ORAL | 1 refills | Status: DC
  Filled 2021-11-01: qty 90, 90d supply, fill #0
  Filled 2022-02-07: qty 90, 90d supply, fill #1

## 2021-11-01 NOTE — Progress Notes (Unsigned)
Subjective:  Patient ID: Julie Clark, female    DOB: Mar 28, 1956  Age: 66 y.o. MRN: [id number]  CC: Annual Exam, Hypertension, and Atrial Fibrillation   HPI DIOSELINA BRUMBAUGH presents for a CPX and f/up -  She is active and denies chest pain, shortness of breath, palpitations, diaphoresis, dizziness, lightheadedness, presyncope, or edema.  Outpatient Medications Prior to Visit  Medication Sig Dispense Refill   acetaminophen (TYLENOL) 500 MG tablet Take 500 mg by mouth every 6 (six) hours as needed.     apixaban (ELIQUIS) 5 MG TABS tablet Take 1 tablet (5 mg total) by mouth 2 (two) times daily. 180 tablet 1   azaTHIOprine (IMURAN) 50 MG tablet TAKE 3 TABLETS BY MOUTH DAILY WITH LUNCH 180 tablet 1   benzonatate (TESSALON) 100 MG capsule Take 1 capsule (100 mg total) by mouth 3 (three) times daily as needed for cough. 30 capsule 0   cephALEXin (KEFLEX) 500 MG capsule Take 4 capsules by mouth 1 hour before appointment (Patient taking differently: For dental appts) 28 capsule 1   diltiazem (CARDIZEM CD) 120 MG 24 hr capsule Take 1 capsule (120 mg total) by mouth daily. 90 capsule 3   inFLIXimab (REMICADE IV) Inject into the vein. Every 6 weeks     Polyethylene Glycol 3350 (MIRALAX PO) Take 1 Package by mouth daily. Mix powder with liquid and take once daily     metoprolol succinate (TOPROL-XL) 25 MG 24 hr tablet TAKE 1 TABLET BY MOUTH TWICE DAILY 180 tablet 0   Facility-Administered Medications Prior to Visit  Medication Dose Route Frequency Provider Last Rate Last Admin   Chlorhexidine Gluconate Cloth 2 % PADS 6 each  6 each Topical Once Michael Boston, MD       And   Chlorhexidine Gluconate Cloth 2 % PADS 6 each  6 each Topical Once Michael Boston, MD        ROS Review of Systems  Constitutional: Negative.  Negative for diaphoresis and fatigue.  HENT: Negative.    Eyes: Negative.   Respiratory:  Negative for cough, chest tightness, shortness of breath and wheezing.   Cardiovascular:   Negative for chest pain, palpitations and leg swelling.  Gastrointestinal:  Negative for abdominal pain, constipation, diarrhea, nausea and vomiting.  Endocrine: Negative.   Genitourinary: Negative.  Negative for difficulty urinating.  Musculoskeletal: Negative.  Negative for arthralgias.  Skin: Negative.   Neurological:  Negative for dizziness, weakness, light-headedness and headaches.  Hematological:  Negative for adenopathy. Does not bruise/bleed easily.  Psychiatric/Behavioral: Negative.      Objective:  BP 138/74 (BP Location: Left Arm, Patient Position: Sitting, Cuff Size: Large)   Pulse 66   Temp 98.7 F (37.1 C) (Oral)   Ht 5' 4"  (1.626 m)   Wt 167 lb (75.8 kg)   SpO2 97%   BMI 28.67 kg/m   BP Readings from Last 3 Encounters:  11/01/21 138/74  10/30/21 135/72  10/06/21 (!) 142/70    Wt Readings from Last 3 Encounters:  11/01/21 167 lb (75.8 kg)  10/30/21 167 lb (75.8 kg)  10/06/21 169 lb (76.7 kg)    Physical Exam Vitals reviewed.  Constitutional:      Appearance: She is not ill-appearing.  HENT:     Nose: Nose normal.     Mouth/Throat:     Mouth: Mucous membranes are moist.  Eyes:     General: No scleral icterus.    Conjunctiva/sclera: Conjunctivae normal.  Cardiovascular:     Rate and Rhythm:  Normal rate and regular rhythm.     Heart sounds: No murmur heard. Pulmonary:     Effort: Pulmonary effort is normal.     Breath sounds: No stridor. No wheezing, rhonchi or rales.  Abdominal:     General: Abdomen is flat.     Palpations: There is no mass.     Tenderness: There is no abdominal tenderness. There is no guarding.     Hernia: No hernia is present.  Musculoskeletal:        General: Normal range of motion.     Cervical back: Neck supple.     Right lower leg: No edema.     Left lower leg: No edema.  Lymphadenopathy:     Cervical: No cervical adenopathy.  Skin:    General: Skin is warm and dry.  Neurological:     General: No focal deficit  present.     Mental Status: She is alert.  Psychiatric:        Mood and Affect: Mood normal.        Behavior: Behavior normal.     Lab Results  Component Value Date   WBC 8.3 11/01/2021   HGB 12.4 11/01/2021   HCT 37.8 11/01/2021   PLT 328.0 11/01/2021   GLUCOSE 110 (H) 09/15/2021   CHOL 236 (H) 11/02/2021   TRIG 152.0 (H) 11/02/2021   HDL 48.00 11/02/2021   LDLCALC 157 (H) 11/02/2021   ALT 37 09/15/2021   AST 33 09/15/2021   NA 137 09/15/2021   K 3.5 09/15/2021   CL 105 09/15/2021   CREATININE 0.83 09/15/2021   BUN 15 09/15/2021   CO2 25 09/15/2021   TSH 0.589 09/15/2021    CT HEAD WO CONTRAST (5MM)  Result Date: 09/15/2021 CLINICAL DATA:  Syncopal episode. New onset seizure. Fell to ground and hit head. EXAM: CT HEAD WITHOUT CONTRAST TECHNIQUE: Contiguous axial images were obtained from the base of the skull through the vertex without intravenous contrast. RADIATION DOSE REDUCTION: This exam was performed according to the departmental dose-optimization program which includes automated exposure control, adjustment of the mA and/or kV according to patient size and/or use of iterative reconstruction technique. COMPARISON:  None. FINDINGS: Brain: No evidence of intracranial hemorrhage, acute infarction, hydrocephalus, extra-axial collection, or mass lesion/mass effect. Vascular:  No hyperdense vessel or other acute findings. Skull: No evidence of fracture or other significant bone abnormality. Sinuses/Orbits:  No acute findings. Other: None. IMPRESSION: Negative noncontrast head CT. Electronically Signed   By: Marlaine Hind M.D.   On: 09/15/2021 13:15   DG Chest Portable 1 View  Result Date: 09/15/2021 CLINICAL DATA:  Syncope, arrhythmia, fatigue EXAM: PORTABLE CHEST 1 VIEW COMPARISON:  07/18/2015 FINDINGS: Cardiac and mediastinal contours are within normal limits given AP technique. No focal pulmonary opacity. No pleural effusion or pneumothorax. No acute osseous abnormality.  IMPRESSION: No acute cardiopulmonary process. Electronically Signed   By: Merilyn Baba M.D.   On: 09/15/2021 12:54    Assessment & Plan:   Westyn was seen today for annual exam, hypertension and atrial fibrillation.  Diagnoses and all orders for this visit:  Vitamin B12 deficiency anemia due to intrinsic factor deficiency- H&H and B12 are normal.  I will treat the folate deficiency. -     CBC with Differential/Platelet; Future -     Vitamin B12; Future -     Folate; Future -     Folate -     Vitamin B12 -     CBC with Differential/Platelet  Paroxysmal atrial fibrillation ([place])- She has good rate and rhythm control. -     metoprolol succinate (TOPROL-XL) 25 MG 24 hr tablet; TAKE 1 TABLET BY MOUTH TWICE DAILY  Essential hypertension, benign- Her blood pressure is well controlled. -     metoprolol succinate (TOPROL-XL) 25 MG 24 hr tablet; TAKE 1 TABLET BY MOUTH TWICE DAILY -     CBC with Differential/Platelet; Future -     CBC with Differential/Platelet  Iron deficiency anemia due to chronic blood loss- H&H and iron levels are normal. -     CBC with Differential/Platelet; Future -     IBC + Ferritin; Future -     IBC + Ferritin -     CBC with Differential/Platelet  Thiamine - I will monitor her thiamine level. -     CBC with Differential/Platelet; Future -     Vitamin B1; Future -     Vitamin B1 -     CBC with Differential/Platelet  Encounter for general adult medical examination with abnormal findings- Exam completed, labs reviewed, vaccines are up-to-date, cancer screenings addressed, patient education was given. -     Lipid panel; Future  Folate deficiency -     folic acid (FOLVITE) 1 MG tablet; Take 1 tablet (1 mg total) by mouth daily.  Hyperlipidemia with target LDL less than 160- Statin therapy is not indicated.   I am having [place] start on folic acid. I am also having her maintain her Polyethylene Glycol 3350 (MIRALAX PO), inFLIXimab (REMICADE IV),  acetaminophen, azaTHIOprine, cephALEXin, diltiazem, apixaban, benzonatate, and metoprolol succinate.  Meds ordered this encounter  Medications   metoprolol succinate (TOPROL-XL) 25 MG 24 hr tablet    Sig: TAKE 1 TABLET BY MOUTH TWICE DAILY    Dispense:  180 tablet    Refill:  1   folic acid (FOLVITE) 1 MG tablet    Sig: Take 1 tablet (1 mg total) by mouth daily.    Dispense:  90 tablet    Refill:  1     Follow-up: Return in about 6 months (around 05/03/2022).  Scarlette Calico, MD

## 2021-11-01 NOTE — Patient Instructions (Signed)

## 2021-11-02 ENCOUNTER — Other Ambulatory Visit (INDEPENDENT_AMBULATORY_CARE_PROVIDER_SITE_OTHER): Payer: 59

## 2021-11-02 ENCOUNTER — Other Ambulatory Visit (HOSPITAL_COMMUNITY): Payer: Self-pay

## 2021-11-02 DIAGNOSIS — Z0001 Encounter for general adult medical examination with abnormal findings: Secondary | ICD-10-CM

## 2021-11-02 LAB — LIPID PANEL
Cholesterol: 236 mg/dL — ABNORMAL HIGH (ref 0–200)
HDL: 48 mg/dL (ref >39.00)
LDL Cholesterol: 157 mg/dL — ABNORMAL HIGH (ref 0–99)
NonHDL: 187.86
Total CHOL/HDL Ratio: 5
Triglycerides: 152 mg/dL — ABNORMAL HIGH (ref 0.0–149.0)
VLDL: 30.4 mg/dL (ref 0.0–40.0)

## 2021-11-07 LAB — VITAMIN B1: Vitamin B1 (Thiamine): 14 nmol/L (ref 8–30)

## 2021-11-10 ENCOUNTER — Ambulatory Visit
Admission: RE | Admit: 2021-11-10 | Discharge: 2021-11-10 | Disposition: A | Discharge status: Home / Self Care | Payer: 59 | Source: Ambulatory Visit | Attending: Internal Medicine | Admitting: Internal Medicine

## 2021-11-10 DIAGNOSIS — Z1231 Encounter for screening mammogram for malignant neoplasm of breast: Secondary | ICD-10-CM

## 2021-11-15 ENCOUNTER — Other Ambulatory Visit (HOSPITAL_COMMUNITY): Payer: Self-pay

## 2021-12-11 ENCOUNTER — Ambulatory Visit (INDEPENDENT_AMBULATORY_CARE_PROVIDER_SITE_OTHER): Payer: 59

## 2021-12-11 VITALS — BP 156/74 | HR 40 | Temp 97.7°F | Resp 18 | Ht 64.0 in | Wt 168.0 lb

## 2021-12-11 DIAGNOSIS — K50114 Crohn's disease of large intestine with abscess: Secondary | ICD-10-CM

## 2021-12-11 DIAGNOSIS — K50119 Crohn's disease of large intestine with unspecified complications: Secondary | ICD-10-CM | POA: Diagnosis not present

## 2021-12-11 MED ORDER — ACETAMINOPHEN 325 MG PO TABS: 650.0000 mg | ORAL_TABLET | Freq: Once | ORAL | Status: DC

## 2021-12-11 MED ORDER — DIPHENHYDRAMINE HCL 25 MG PO CAPS: 25.0000 mg | ORAL_CAPSULE | Freq: Once | ORAL | Status: DC

## 2021-12-11 MED ORDER — METHYLPREDNISOLONE SODIUM SUCC 40 MG IJ SOLR: 40.0000 mg | Freq: Once | INTRAMUSCULAR | Status: DC

## 2021-12-11 MED ORDER — SODIUM CHLORIDE 0.9 % IV SOLN
10.0000 mg/kg | Freq: Once | INTRAVENOUS | Status: AC
  Administered 2021-12-11: 800 mg via INTRAVENOUS
  Filled 2021-12-11: qty 80

## 2021-12-11 NOTE — Progress Notes (Signed)
Diagnosis: Crohn's Disease  Provider:  Marshell Garfinkel, MD  Procedure: Infusion  IV Type: Peripheral, IV Location: L Antecubital  Avsola (Infliximab-axxq), Dose: 877m  Infusion Start Time: 05217 Infusion Stop Time: 1205  Post Infusion IV Care: Peripheral IV Discontinued  Discharge: Condition: Good, Destination: Home . AVS provided to patient.   Performed by:  SArnoldo Morale RN

## 2021-12-20 ENCOUNTER — Other Ambulatory Visit (HOSPITAL_COMMUNITY): Payer: Self-pay

## 2022-01-05 ENCOUNTER — Telehealth: Payer: Self-pay

## 2022-01-05 NOTE — Telephone Encounter (Signed)
Julie Clark,  This pt received a letter about her Remicade and her enrollment with [employer].  I told her I would reach out to you since she gets her infusions at your office.  Can you please call her at [phone]?  He letter states that they need some documentation related to treatment.  Thank you for your help.

## 2022-01-16 ENCOUNTER — Other Ambulatory Visit (HOSPITAL_COMMUNITY): Payer: Self-pay

## 2022-01-16 ENCOUNTER — Other Ambulatory Visit: Payer: Self-pay | Admitting: Gastroenterology

## 2022-01-16 MED ORDER — AZATHIOPRINE 50 MG PO TABS
ORAL_TABLET | ORAL | 1 refills | Status: DC
  Filled 2022-01-16: qty 180, 60d supply, fill #0
  Filled 2022-03-21: qty 180, 60d supply, fill #1

## 2022-01-16 NOTE — Telephone Encounter (Signed)
[name], I have called and patient does not have v/m available.  I will attempt to reach patient again.

## 2022-01-19 ENCOUNTER — Ambulatory Visit: Payer: 59 | Admitting: Cardiology

## 2022-01-22 ENCOUNTER — Ambulatory Visit (INDEPENDENT_AMBULATORY_CARE_PROVIDER_SITE_OTHER): Payer: 59

## 2022-01-22 VITALS — BP 154/70 | HR 54 | Temp 98.5°F | Resp 16 | Ht 64.0 in | Wt 177.6 lb

## 2022-01-22 DIAGNOSIS — K5010 Crohn's disease of large intestine without complications: Secondary | ICD-10-CM | POA: Diagnosis not present

## 2022-01-22 DIAGNOSIS — K50114 Crohn's disease of large intestine with abscess: Secondary | ICD-10-CM

## 2022-01-22 DIAGNOSIS — K50119 Crohn's disease of large intestine with unspecified complications: Secondary | ICD-10-CM

## 2022-01-22 MED ORDER — METHYLPREDNISOLONE SODIUM SUCC 40 MG IJ SOLR: 40.0000 mg | Freq: Once | INTRAMUSCULAR | Status: DC

## 2022-01-22 MED ORDER — DIPHENHYDRAMINE HCL 25 MG PO CAPS: 25.0000 mg | ORAL_CAPSULE | Freq: Once | ORAL | Status: DC

## 2022-01-22 MED ORDER — SODIUM CHLORIDE 0.9 % IV SOLN
10.0000 mg/kg | Freq: Once | INTRAVENOUS | Status: AC
  Administered 2022-01-22: 800 mg via INTRAVENOUS
  Filled 2022-01-22: qty 80

## 2022-01-22 MED ORDER — ACETAMINOPHEN 325 MG PO TABS: 650.0000 mg | ORAL_TABLET | Freq: Once | ORAL | Status: DC

## 2022-01-22 NOTE — Progress Notes (Signed)
Diagnosis: Crohn's Disease  Provider:  Marshell Garfinkel MD  Procedure: Infusion  IV Type: Peripheral, IV Location: L Antecubital  Avsola (infliximab-axxq), Dose: 860m   Infusion Start Time: 1130  Infusion Stop Time: 13552 Post Infusion IV Care: Peripheral IV Discontinued  Discharge: Condition: Good, Destination: Home . AVS provided to patient.   Performed by:  LAdelina Mings LPN

## 2022-02-07 ENCOUNTER — Other Ambulatory Visit (HOSPITAL_COMMUNITY): Payer: Self-pay

## 2022-02-16 ENCOUNTER — Telehealth: Payer: Self-pay | Admitting: Pharmacy Technician

## 2022-02-16 NOTE — Telephone Encounter (Addendum)
Auth Submission: APPROVED- [place] RENEW[place] Payer: [employer] Medication & CPT/J Code(s) submitted: Avsola (infliximab-axxq) [phone] Route of submission (phone, fax, portal): PORTAL Phone # Fax # Auth type: Buy/Bill Units/visits requested: [id number] Reference number: ID: [id number] Aerial id: [id number] Approval from: 03/04/22 to 03/05/23 at [place]

## 2022-02-19 NOTE — Progress Notes (Unsigned)
    Julie Clark D.[place] Phone: [phone]   Assessment and Plan:     There are no diagnoses linked to this encounter.  ***   Pertinent previous records reviewed include ***   Follow Up: ***     Subjective:   I, Julie Clark, am serving as a Education administrator for Doctor Glennon Mac   Chief Complaint: right hip pain    HPI:  08/30/21 Patient is a 66 year old female complaining of right hip pain. Patient states that its been going on for several years but that would come and go due to weather but with in the last 6 months it has gotten worse does have chron's disease so doesn't know if that is affecting her hip. No numbness tingling, lifting the leg hurts the worst having to flex to sit getting into the car , has the most pain when she is at work, when she is at home she feels better, tylenol takes the bite out of it , no MOI , has a hx of Hemorids    09/12/2021 Patient states that she is doing good, is there a particular shoe that she should be wearing , especially at work    02/20/2022 Patient states   Relevant Historical Information: Crohn's disease, on chronic anticoagulation with Eliquis, hypertension  Additional pertinent review of systems negative.   Current Outpatient Medications:    acetaminophen (TYLENOL) 500 MG tablet, Take 500 mg by mouth every 6 (six) hours as needed., Disp: , Rfl:    apixaban (ELIQUIS) 5 MG TABS tablet, Take 1 tablet (5 mg total) by mouth 2 (two) times daily., Disp: 180 tablet, Rfl: 1   azaTHIOprine (IMURAN) 50 MG tablet, TAKE 3 TABLETS BY MOUTH DAILY WITH LUNCH, Disp: 180 tablet, Rfl: 1   cephALEXin (KEFLEX) 500 MG capsule, Take 4 capsules by mouth 1 hour before appointment (Patient taking differently: For dental appts), Disp: 28 capsule, Rfl: 1   diltiazem (CARDIZEM CD) 120 MG 24 hr capsule, Take 1 capsule (120 mg total) by mouth daily., Disp: 90 capsule, Rfl: 3   folic  acid (FOLVITE) 1 MG tablet, Take 1 tablet (1 mg total) by mouth daily., Disp: 90 tablet, Rfl: 1   inFLIXimab (REMICADE IV), Inject into the vein. Every 6 weeks, Disp: , Rfl:    metoprolol succinate (TOPROL-XL) 25 MG 24 hr tablet, TAKE 1 TABLET BY MOUTH TWICE DAILY, Disp: 180 tablet, Rfl: 1   Polyethylene Glycol 3350 (MIRALAX PO), Take 1 Package by mouth daily. Mix powder with liquid and take once daily, Disp: , Rfl:  No current facility-administered medications for this visit.  Facility-Administered Medications Ordered in Other Visits:    6 CHG cloth bath night before surgery, , , Once **AND** 6 CHG cloth bath AM of surgery, , , Once **AND** Chlorhexidine Gluconate Cloth 2 % PADS 6 each, 6 each, Topical, Once **AND** Chlorhexidine Gluconate Cloth 2 % PADS 6 each, 6 each, Topical, Once, Michael Boston, MD   Objective:     There were no vitals filed for this visit.    There is no height or weight on file to calculate BMI.    Physical Exam:    ***   Electronically signed by:  Julie Clark D.[facility] 7:50 AM 02/19/22

## 2022-02-20 ENCOUNTER — Ambulatory Visit: Payer: 59 | Admitting: Sports Medicine

## 2022-02-20 ENCOUNTER — Ambulatory Visit: Payer: Self-pay

## 2022-02-20 VITALS — BP 138/78 | HR 70 | Ht 64.0 in | Wt 183.0 lb

## 2022-02-20 DIAGNOSIS — M25551 Pain in right hip: Secondary | ICD-10-CM | POA: Diagnosis not present

## 2022-02-20 DIAGNOSIS — M1611 Unilateral primary osteoarthritis, right hip: Secondary | ICD-10-CM | POA: Diagnosis not present

## 2022-02-20 NOTE — Patient Instructions (Signed)
Good to see you  As needed follow up if no improvement 2-3 week follow up , otherwise 3 month or longer follow up for repeat injection

## 2022-02-22 ENCOUNTER — Encounter: Payer: Self-pay | Admitting: Gastroenterology

## 2022-03-05 ENCOUNTER — Ambulatory Visit (INDEPENDENT_AMBULATORY_CARE_PROVIDER_SITE_OTHER): Payer: 59

## 2022-03-05 VITALS — BP 142/78 | HR 57 | Temp 97.7°F | Resp 16 | Ht 64.0 in | Wt 176.4 lb

## 2022-03-05 DIAGNOSIS — K50119 Crohn's disease of large intestine with unspecified complications: Secondary | ICD-10-CM | POA: Diagnosis not present

## 2022-03-05 DIAGNOSIS — K50114 Crohn's disease of large intestine with abscess: Secondary | ICD-10-CM

## 2022-03-05 MED ORDER — ACETAMINOPHEN 325 MG PO TABS: 650.0000 mg | ORAL_TABLET | Freq: Once | ORAL | Status: DC

## 2022-03-05 MED ORDER — SODIUM CHLORIDE 0.9 % IV SOLN
10.0000 mg/kg | Freq: Once | INTRAVENOUS | Status: AC
  Administered 2022-03-05: 800 mg via INTRAVENOUS
  Filled 2022-03-05: qty 80

## 2022-03-05 MED ORDER — METHYLPREDNISOLONE SODIUM SUCC 40 MG IJ SOLR: 40.0000 mg | Freq: Once | INTRAMUSCULAR | Status: DC

## 2022-03-05 MED ORDER — DIPHENHYDRAMINE HCL 25 MG PO CAPS: 25.0000 mg | ORAL_CAPSULE | Freq: Once | ORAL | Status: DC

## 2022-03-05 NOTE — Progress Notes (Signed)
Diagnosis: Crohn's Disease  Provider:  Marshell Garfinkel MD  Procedure: Infusion  IV Type: Peripheral, IV Location: L Antecubital  Avsola (infliximab-axxq), Dose: 800 mg  Infusion Start Time: 1901  Infusion Stop Time: 1140  Post Infusion IV Care: Peripheral IV Discontinued  Discharge: Condition: Good, Destination: Home . AVS provided to patient.   Performed by:  Adelina Mings, LPN

## 2022-03-07 ENCOUNTER — Telehealth: Payer: Self-pay

## 2022-03-07 NOTE — Telephone Encounter (Signed)
Thank you :)

## 2022-03-07 NOTE — Telephone Encounter (Signed)
This was a renewal for continuation of therapy for her Avsola.  Nothing further is needed.  Thanks for letting me know.

## 2022-03-07 NOTE — Telephone Encounter (Signed)
Joelene Millin this pt received a letter from [facility] that Avsola 10 mg has been determined to be medically necessary.  Date of service 03/04/22-03/05/23 CPT Code: [id number].  Is there anything that I need to do?

## 2022-03-20 ENCOUNTER — Other Ambulatory Visit (HOSPITAL_COMMUNITY): Payer: Self-pay

## 2022-03-21 ENCOUNTER — Other Ambulatory Visit: Payer: Self-pay | Admitting: Internal Medicine

## 2022-03-21 ENCOUNTER — Other Ambulatory Visit: Payer: 59

## 2022-03-21 ENCOUNTER — Other Ambulatory Visit (HOSPITAL_COMMUNITY): Payer: Self-pay

## 2022-03-21 DIAGNOSIS — K50119 Crohn's disease of large intestine with unspecified complications: Secondary | ICD-10-CM

## 2022-03-21 NOTE — Progress Notes (Signed)
Annual TB test

## 2022-03-22 ENCOUNTER — Other Ambulatory Visit: Payer: Self-pay | Admitting: Pharmacy Technician

## 2022-03-25 LAB — QUANTIFERON-TB GOLD PLUS
Mitogen-NIL: >10 IU/mL
NIL: 0.04 IU/mL
QuantiFERON-TB Gold Plus: NEGATIVE
TB1-NIL: 0 IU/mL
TB2-NIL: 0 IU/mL

## 2022-04-05 ENCOUNTER — Other Ambulatory Visit (HOSPITAL_COMMUNITY): Payer: Self-pay

## 2022-04-05 ENCOUNTER — Other Ambulatory Visit: Payer: Self-pay | Admitting: Student

## 2022-04-05 DIAGNOSIS — I480 Paroxysmal atrial fibrillation: Secondary | ICD-10-CM

## 2022-04-05 MED ORDER — APIXABAN 5 MG PO TABS
5.0000 mg | ORAL_TABLET | Freq: Two times a day (BID) | ORAL | 1 refills | Status: DC
  Filled 2022-04-05: qty 180, 90d supply, fill #0
  Filled 2022-07-17: qty 180, 90d supply, fill #1

## 2022-04-05 NOTE — Telephone Encounter (Signed)
Eliquis 46m refill request received. Patient is 66years old, weight-80kg, Crea-0.83 on 09/15/2021, Diagnosis-Afib, and last seen by AOda Kiltson 10/06/2021. Dose is appropriate based on dosing criteria. Will send in refill to requested pharmacy.

## 2022-04-16 ENCOUNTER — Ambulatory Visit (INDEPENDENT_AMBULATORY_CARE_PROVIDER_SITE_OTHER): Payer: 59

## 2022-04-16 VITALS — BP 147/79 | HR 54 | Temp 97.5°F | Resp 18 | Ht 64.0 in | Wt 176.4 lb

## 2022-04-16 DIAGNOSIS — K50119 Crohn's disease of large intestine with unspecified complications: Secondary | ICD-10-CM | POA: Diagnosis not present

## 2022-04-16 DIAGNOSIS — K50114 Crohn's disease of large intestine with abscess: Secondary | ICD-10-CM

## 2022-04-16 MED ORDER — DIPHENHYDRAMINE HCL 25 MG PO CAPS
25.0000 mg | ORAL_CAPSULE | Freq: Once | ORAL | Status: DC
  Filled 2022-04-16: qty 1

## 2022-04-16 MED ORDER — METHYLPREDNISOLONE SODIUM SUCC 40 MG IJ SOLR
40.0000 mg | Freq: Once | INTRAMUSCULAR | Status: DC
  Filled 2022-04-16: qty 1

## 2022-04-16 MED ORDER — SODIUM CHLORIDE 0.9 % IV SOLN
10.0000 mg/kg | Freq: Once | INTRAVENOUS | Status: AC
  Administered 2022-04-16: 800 mg via INTRAVENOUS
  Filled 2022-04-16: qty 80

## 2022-04-16 MED ORDER — ACETAMINOPHEN 325 MG PO TABS
650.0000 mg | ORAL_TABLET | Freq: Once | ORAL | Status: DC
  Filled 2022-04-16: qty 2

## 2022-04-16 NOTE — Progress Notes (Signed)
Diagnosis: Crohn's Disease  Provider:  Marshell Garfinkel MD  Procedure: Infusion  IV Type: Peripheral, IV Location: L Antecubital  Avsola (infliximab-axxq), Dose: 800 mg  Infusion Start Time: 1610  Infusion Stop Time: 1150   Post Infusion IV Care: Patient declined observation  Discharge: Condition: Good, Destination: Home . AVS provided to patient.   Performed by:  Adelina Mings, LPN

## 2022-04-17 ENCOUNTER — Other Ambulatory Visit: Payer: Self-pay | Admitting: Pharmacy Technician

## 2022-05-09 ENCOUNTER — Other Ambulatory Visit (HOSPITAL_COMMUNITY): Payer: Self-pay

## 2022-05-10 ENCOUNTER — Encounter: Payer: Self-pay | Admitting: Gastroenterology

## 2022-05-12 ENCOUNTER — Encounter: Payer: Self-pay | Admitting: Gastroenterology

## 2022-05-14 ENCOUNTER — Other Ambulatory Visit (HOSPITAL_COMMUNITY): Payer: Self-pay

## 2022-05-14 ENCOUNTER — Telehealth: Payer: 59 | Admitting: Nurse Practitioner

## 2022-05-14 ENCOUNTER — Other Ambulatory Visit: Payer: Self-pay

## 2022-05-14 DIAGNOSIS — N3000 Acute cystitis without hematuria: Secondary | ICD-10-CM

## 2022-05-14 MED ORDER — SULFAMETHOXAZOLE-TRIMETHOPRIM 800-160 MG PO TABS
1.0000 | ORAL_TABLET | Freq: Two times a day (BID) | ORAL | 0 refills | Status: DC
  Filled 2022-05-14: qty 10, 5d supply, fill #0

## 2022-05-14 MED ORDER — NITROFURANTOIN MONOHYD MACRO 100 MG PO CAPS
100.0000 mg | ORAL_CAPSULE | Freq: Two times a day (BID) | ORAL | 0 refills | Status: AC
  Filled 2022-05-14 (×2): qty 10, 5d supply, fill #0

## 2022-05-14 NOTE — Progress Notes (Signed)
E-Visit for Urinary Problems  We are sorry that you are not feeling well.  Here is how we plan to help!  Based on what you shared with me it looks like you most likely have a simple urinary tract infection.  A UTI (Urinary Tract Infection) is a bacterial infection of the bladder.  Most cases of urinary tract infections are simple to treat but a key part of your care is to encourage you to drink plenty of fluids and watch your symptoms carefully.  I have prescribed Bactrim DS One tablet twice a day for 5 days.  Your symptoms should gradually improve. Call us if the burning in your urine worsens, you develop worsening fever, back pain or pelvic pain or if your symptoms do not resolve after completing the antibiotic.  Urinary tract infections can be prevented by drinking plenty of water to keep your body hydrated.  Also be sure when you wipe, wipe from front to back and don't hold it in!  If possible, empty your bladder every 4 hours.  HOME CARE Drink plenty of fluids Compete the full course of the antibiotics even if the symptoms resolve Remember, when you need to go.go. Holding in your urine can increase the likelihood of getting a UTI! GET HELP RIGHT AWAY IF: You cannot urinate You get a high fever Worsening back pain occurs You see blood in your urine You feel sick to your stomach or throw up You feel like you are going to pass out  MAKE SURE YOU  Understand these instructions. Will watch your condition. Will get help right away if you are not doing well or get worse.   Thank you for choosing an e-visit.  Your e-visit answers were reviewed by a board certified advanced clinical practitioner to complete your personal care plan. Depending upon the condition, your plan could have included both over the counter or prescription medications.  Please review your pharmacy choice. Make sure the pharmacy is open so you can pick up prescription now. If there is a problem, you may contact  your provider through [employer] and have the prescription routed to another pharmacy.  Your safety is important to [place]. If you have drug allergies check your prescription carefully.   For the next 24 hours you can use MyChart to ask questions about today's visit, request a non-urgent call back, or ask for a work or school excuse. You will get an email in the next two days asking about your experience. I hope that your e-visit has been valuable and will speed your recovery.   Meds ordered this encounter  Medications   sulfamethoxazole-trimethoprim (BACTRIM DS) 800-160 MG tablet    Sig: Take 1 tablet by mouth 2 (two) times daily for 5 days.    Dispense:  10 tablet    Refill:  0    I spent approximately 5 minutes reviewing the patient's history, current symptoms and coordinating their care today.

## 2022-05-14 NOTE — Progress Notes (Signed)
Medicine changed per patient request  Meds ordered this encounter  Medications   DISCONTD: sulfamethoxazole-trimethoprim (BACTRIM DS) 800-160 MG tablet    Sig: Take 1 tablet by mouth 2 (two) times daily for 5 days.    Dispense:  10 tablet    Refill:  0   nitrofurantoin, macrocrystal-monohydrate, (MACROBID) 100 MG capsule    Sig: Take 1 capsule (100 mg total) by mouth 2 (two) times daily for 5 days.    Dispense:  10 capsule    Refill:  0

## 2022-05-14 NOTE — Addendum Note (Signed)
Addended by: Apolonio Schneiders E on: 05/14/2022 08:40 AM   Modules accepted: Orders

## 2022-05-16 ENCOUNTER — Other Ambulatory Visit (HOSPITAL_COMMUNITY): Payer: Self-pay

## 2022-05-16 ENCOUNTER — Other Ambulatory Visit: Payer: Self-pay | Admitting: Gastroenterology

## 2022-05-17 ENCOUNTER — Other Ambulatory Visit: Payer: Self-pay

## 2022-05-17 ENCOUNTER — Other Ambulatory Visit (HOSPITAL_COMMUNITY): Payer: Self-pay

## 2022-05-17 DIAGNOSIS — K50114 Crohn's disease of large intestine with abscess: Secondary | ICD-10-CM

## 2022-05-17 DIAGNOSIS — K50119 Crohn's disease of large intestine with unspecified complications: Secondary | ICD-10-CM

## 2022-05-17 MED ORDER — AZATHIOPRINE 50 MG PO TABS
150.0000 mg | ORAL_TABLET | Freq: Every day | ORAL | 2 refills | Status: DC
  Filled 2022-05-17: qty 270, 90d supply, fill #0
  Filled 2022-08-21: qty 270, 90d supply, fill #1
  Filled 2023-01-24: qty 270, 90d supply, fill #2

## 2022-05-17 NOTE — Telephone Encounter (Signed)
OK to refill azathioprine?  Reinette plans to follow up with you in Dr Ardis Hughs' absence.  Please advise.

## 2022-05-17 NOTE — Telephone Encounter (Signed)
Yes, we also need to check cbc, cmp with this med

## 2022-05-18 ENCOUNTER — Other Ambulatory Visit (HOSPITAL_COMMUNITY): Payer: Self-pay

## 2022-05-23 ENCOUNTER — Other Ambulatory Visit (INDEPENDENT_AMBULATORY_CARE_PROVIDER_SITE_OTHER): Payer: 59

## 2022-05-23 DIAGNOSIS — K50119 Crohn's disease of large intestine with unspecified complications: Secondary | ICD-10-CM | POA: Diagnosis not present

## 2022-05-23 DIAGNOSIS — K50114 Crohn's disease of large intestine with abscess: Secondary | ICD-10-CM

## 2022-05-23 LAB — CBC WITH DIFFERENTIAL/PLATELET
Basophils Absolute: 0.1 10*3/uL (ref 0.0–0.1)
Basophils Relative: 0.7 % (ref 0.0–3.0)
Eosinophils Absolute: 0.1 10*3/uL (ref 0.0–0.7)
Eosinophils Relative: 1.2 % (ref 0.0–5.0)
HCT: 39.5 % (ref 36.0–46.0)
Hemoglobin: 13.1 g/dL (ref 12.0–15.0)
Lymphocytes Relative: 16.3 % (ref 12.0–46.0)
Lymphs Abs: 1.1 10*3/uL (ref 0.7–4.0)
MCHC: 33.3 g/dL (ref 30.0–36.0)
MCV: 93.5 fl (ref 78.0–100.0)
Monocytes Absolute: 0.6 10*3/uL (ref 0.1–1.0)
Monocytes Relative: 7.9 % (ref 3.0–12.0)
Neutro Abs: 5.2 10*3/uL (ref 1.4–7.7)
Neutrophils Relative %: 73.9 % (ref 43.0–77.0)
Platelets: 418 10*3/uL — ABNORMAL HIGH (ref 150.0–400.0)
RBC: 4.23 Mil/uL (ref 3.87–5.11)
RDW: 16.6 % — ABNORMAL HIGH (ref 11.5–15.5)
WBC: 7 10*3/uL (ref 4.0–10.5)

## 2022-05-23 LAB — COMPREHENSIVE METABOLIC PANEL
ALT: 26 U/L (ref 0–35)
AST: 21 U/L (ref 0–37)
Albumin: 4.1 g/dL (ref 3.5–5.2)
Alkaline Phosphatase: 117 U/L (ref 39–117)
BUN: 13 mg/dL (ref 6–23)
CO2: 30 mEq/L (ref 19–32)
Calcium: 9.2 mg/dL (ref 8.4–10.5)
Chloride: 103 mEq/L (ref 96–112)
Creatinine, Ser: 0.71 mg/dL (ref 0.40–1.20)
GFR: 88.75 mL/min (ref >60.00)
Glucose, Bld: 100 mg/dL — ABNORMAL HIGH (ref 70–99)
Potassium: 4 mEq/L (ref 3.5–5.1)
Sodium: 140 mEq/L (ref 135–145)
Total Bilirubin: 0.5 mg/dL (ref 0.2–1.2)
Total Protein: 7.5 g/dL (ref 6.0–8.3)

## 2022-05-29 ENCOUNTER — Ambulatory Visit: Payer: Commercial Managed Care - PPO

## 2022-05-29 ENCOUNTER — Encounter: Payer: Self-pay | Admitting: Gastroenterology

## 2022-05-29 ENCOUNTER — Telehealth: Payer: Self-pay | Admitting: Pharmacy Technician

## 2022-05-29 VITALS — BP 148/79 | HR 66 | Temp 97.6°F | Resp 20 | Ht 63.0 in | Wt 176.0 lb

## 2022-05-29 DIAGNOSIS — K50119 Crohn's disease of large intestine with unspecified complications: Secondary | ICD-10-CM

## 2022-05-29 DIAGNOSIS — K50114 Crohn's disease of large intestine with abscess: Secondary | ICD-10-CM

## 2022-05-29 MED ORDER — DIPHENHYDRAMINE HCL 25 MG PO CAPS: 25.0000 mg | ORAL_CAPSULE | Freq: Once | ORAL | Status: DC

## 2022-05-29 MED ORDER — METHYLPREDNISOLONE SODIUM SUCC 40 MG IJ SOLR: 40.0000 mg | Freq: Once | INTRAMUSCULAR | Status: DC

## 2022-05-29 MED ORDER — SODIUM CHLORIDE 0.9 % IV SOLN
10.0000 mg/kg | Freq: Once | INTRAVENOUS | Status: DC
  Filled 2022-05-29: qty 80

## 2022-05-29 MED ORDER — ACETAMINOPHEN 325 MG PO TABS: 650.0000 mg | ORAL_TABLET | Freq: Once | ORAL | Status: DC

## 2022-05-29 NOTE — Telephone Encounter (Addendum)
  Fyi note:   New insurance: [employer] Patient will continue Avsola treatment Auth Submission: APPROVED Payer: [employer] Medication & CPT/J Code(s) submitted: Avsola (infliximab-axxq) [phone] Route of submission (phone, fax, portal):  Phone # Fax # Auth type: Buy/Bill Units/visits requested: 8 Reference number: [id number] Approval from: 05/30/22 to 05/31/23

## 2022-05-29 NOTE — Progress Notes (Signed)
Infusion not given due to insurance approval appending. Pt will be rescheduled once authorized.

## 2022-05-30 ENCOUNTER — Other Ambulatory Visit (HOSPITAL_COMMUNITY): Payer: Self-pay

## 2022-05-30 ENCOUNTER — Encounter: Payer: Self-pay | Admitting: Gastroenterology

## 2022-06-01 ENCOUNTER — Encounter: Payer: Self-pay | Admitting: Gastroenterology

## 2022-06-01 ENCOUNTER — Other Ambulatory Visit: Payer: Self-pay | Admitting: Pharmacy Technician

## 2022-06-07 ENCOUNTER — Other Ambulatory Visit: Payer: Self-pay | Admitting: Pharmacy Technician

## 2022-06-07 NOTE — Progress Notes (Signed)
Julie Clark [place] Phone: [phone]   Assessment and Plan:     1. Right hip pain 2. Primary osteoarthritis of right hip  -Chronic with exacerbation, subsequent visit - Consistent with flare of right hip osteoarthritis, however unfortunately patient got no relief from intra-articular hip CSI performed on 02/20/2022.  Patient did have 4 to 5 months relief after hip CSI on 08/30/2021 - Based on no improvement with last intra-articular hip CSI, failure to improve with >6 weeks of conservative therapy, pain frequently >6/10, and findings on x-ray imaging, we will proceed with right hip MRI with intra-articular contrast.  Patient has increased bleeding risk due to Eliquis use - Recommend continuing HEP - Use Tylenol for day-to-day pain relief  Pertinent previous records reviewed include none   Follow Up: 3 days after MRI to review results and discuss treatment plan   Subjective:   I, Pincus Badder, am serving as a Education administrator for Doctor Glennon Mac   Chief Complaint: right hip pain    HPI:  08/30/21 Patient is a 67 year old female complaining of right hip pain. Patient states that its been going on for several years but that would come and go due to weather but with in the last 6 months it has gotten worse does have chron's disease so doesn't know if that is affecting her hip. No numbness tingling, lifting the leg hurts the worst having to flex to sit getting into the car , has the most pain when she is at work, when she is at home she feels better, tylenol takes the bite out of it , no MOI , has a hx of Hemorids    09/12/2021 Patient states that she is doing good, is there a particular shoe that she should be wearing , especially at work    02/20/2022 Patient states that she wants a csi , lasted about  3 months     06/08/2022 Patient states wants a csi the last one didn't work    Relevant Historical  Information: Crohn's disease, on chronic anticoagulation with Eliquis, hypertension  Additional pertinent review of systems negative.   Current Outpatient Medications:    acetaminophen (TYLENOL) 500 MG tablet, Take 500 mg by mouth every 6 (six) hours as needed., Disp: , Rfl:    apixaban (ELIQUIS) 5 MG TABS tablet, Take 1 tablet (5 mg total) by mouth 2 (two) times daily., Disp: 180 tablet, Rfl: 1   azaTHIOprine (IMURAN) 50 MG tablet, Take 3 tablets (150 mg total) by mouth daily with lunch., Disp: 270 tablet, Rfl: 2   diltiazem (CARDIZEM CD) 120 MG 24 hr capsule, Take 1 capsule (120 mg total) by mouth daily., Disp: 90 capsule, Rfl: 3   folic acid (FOLVITE) 1 MG tablet, Take 1 tablet (1 mg total) by mouth daily., Disp: 90 tablet, Rfl: 1   inFLIXimab (REMICADE IV), Inject into the vein. Every 6 weeks, Disp: , Rfl:    metoprolol succinate (TOPROL-XL) 25 MG 24 hr tablet, TAKE 1 TABLET BY MOUTH TWICE DAILY, Disp: 180 tablet, Rfl: 1   Polyethylene Glycol 3350 (MIRALAX PO), Take 1 Package by mouth daily. Mix powder with liquid and take once daily, Disp: , Rfl:  No current facility-administered medications for this visit.  Facility-Administered Medications Ordered in Other Visits:    6 CHG cloth bath night before surgery, , , Once **AND** 6 CHG cloth bath AM of surgery, , , Once **AND** Chlorhexidine Gluconate  Cloth 2 % PADS 6 each, 6 each, Topical, Once **AND** Chlorhexidine Gluconate Cloth 2 % PADS 6 each, 6 each, Topical, Once, Gross, Remo Lipps, MD   Objective:     Vitals:   06/08/22 1056  BP: 138/88  Pulse: 81  SpO2: 98%  Weight: 176 lb (79.8 kg)  Height: '5\' 3"'$  (1.6 m)      Body mass index is 31.18 kg/m.    Physical Exam:    General: awake, alert, and oriented no acute distress, nontoxic Skin: no suspicious lesions or rashes Neuro:sensation intact distally with no dificits, normal muscle tone, no atrophy, strength 5/5 in all tested lower ext groups Psych: normal mood and affect, speech  clear   Right hip: No deformity, swelling or wasting ROM Flexion 80, ext 30, IR 25, ER 35 NTTP over the hip flexors, greater troch, glute musculature, si joint, lumbar spine + log roll with FROM Negative FABER + FADIR Negative Piriformis test   Gait normal     Electronically signed by:  Julie Clark D.[facility] 11:07 AM 06/08/22

## 2022-06-08 ENCOUNTER — Ambulatory Visit (INDEPENDENT_AMBULATORY_CARE_PROVIDER_SITE_OTHER): Payer: Commercial Managed Care - PPO | Admitting: Sports Medicine

## 2022-06-08 VITALS — BP 138/88 | HR 81 | Ht 63.0 in | Wt 176.0 lb

## 2022-06-08 DIAGNOSIS — M25551 Pain in right hip: Secondary | ICD-10-CM

## 2022-06-08 DIAGNOSIS — M1611 Unilateral primary osteoarthritis, right hip: Secondary | ICD-10-CM

## 2022-06-08 NOTE — Patient Instructions (Addendum)
Good to see you MRI referral  Follow up 3 days after MRI to discuss results  

## 2022-06-19 ENCOUNTER — Ambulatory Visit (INDEPENDENT_AMBULATORY_CARE_PROVIDER_SITE_OTHER): Payer: Commercial Managed Care - PPO

## 2022-06-19 VITALS — BP 148/70 | HR 54 | Temp 98.2°F | Resp 20 | Ht 64.0 in | Wt 180.0 lb

## 2022-06-19 DIAGNOSIS — K50119 Crohn's disease of large intestine with unspecified complications: Secondary | ICD-10-CM | POA: Diagnosis not present

## 2022-06-19 DIAGNOSIS — K50114 Crohn's disease of large intestine with abscess: Secondary | ICD-10-CM

## 2022-06-19 MED ORDER — DIPHENHYDRAMINE HCL 25 MG PO CAPS: 25.0000 mg | ORAL_CAPSULE | Freq: Once | ORAL | Status: DC

## 2022-06-19 MED ORDER — SODIUM CHLORIDE 0.9 % IV SOLN
10.0000 mg/kg | Freq: Once | INTRAVENOUS | Status: AC
  Administered 2022-06-19: 800 mg via INTRAVENOUS
  Filled 2022-06-19: qty 80

## 2022-06-19 MED ORDER — METHYLPREDNISOLONE SODIUM SUCC 40 MG IJ SOLR: 40.0000 mg | Freq: Once | INTRAMUSCULAR | Status: DC

## 2022-06-19 MED ORDER — ACETAMINOPHEN 325 MG PO TABS: 650.0000 mg | ORAL_TABLET | Freq: Once | ORAL | Status: DC

## 2022-06-19 NOTE — Progress Notes (Signed)
Diagnosis: Crohn's Disease  Provider:  Marshell Garfinkel MD  Procedure: Infusion  IV Type: Peripheral, IV Location: L Antecubital  Avsola (infliximab-axxq), Dose: 800 mg  Infusion Start Time: 0948x  Infusion Stop Time: 7544  Post Infusion IV Care: Peripheral IV Discontinued  Discharge: Condition: Good, Destination: Home . AVS provided to patient.   Performed by:  Adelina Mings, LPN

## 2022-06-20 ENCOUNTER — Other Ambulatory Visit: Payer: Self-pay

## 2022-06-20 ENCOUNTER — Other Ambulatory Visit (HOSPITAL_COMMUNITY): Payer: Self-pay

## 2022-06-20 ENCOUNTER — Other Ambulatory Visit: Payer: Self-pay | Admitting: Internal Medicine

## 2022-06-20 DIAGNOSIS — I10 Essential (primary) hypertension: Secondary | ICD-10-CM

## 2022-06-20 DIAGNOSIS — I480 Paroxysmal atrial fibrillation: Secondary | ICD-10-CM

## 2022-06-20 MED ORDER — METOPROLOL SUCCINATE ER 25 MG PO TB24
25.0000 mg | ORAL_TABLET | Freq: Two times a day (BID) | ORAL | 0 refills | Status: DC
  Filled 2022-06-20: qty 180, 90d supply, fill #0

## 2022-06-22 ENCOUNTER — Other Ambulatory Visit (HOSPITAL_COMMUNITY): Payer: Self-pay

## 2022-06-26 ENCOUNTER — Telehealth (HOSPITAL_COMMUNITY): Payer: Self-pay | Admitting: *Deleted

## 2022-06-26 ENCOUNTER — Telehealth: Payer: Self-pay | Admitting: Cardiology

## 2022-06-26 ENCOUNTER — Encounter: Payer: Self-pay | Admitting: Gastroenterology

## 2022-06-26 ENCOUNTER — Other Ambulatory Visit (HOSPITAL_COMMUNITY): Payer: Self-pay

## 2022-06-26 MED ORDER — DILTIAZEM HCL 30 MG PO TABS
ORAL_TABLET | ORAL | 1 refills | Status: AC
  Filled 2022-06-26: qty 30, 5d supply, fill #0
  Filled 2023-06-17: qty 30, 5d supply, fill #1

## 2022-06-26 NOTE — Telephone Encounter (Signed)
Patient calling in to get a referral to the afib clinic. Patient ask that she gets a call once it is done Please advise

## 2022-06-26 NOTE — Telephone Encounter (Signed)
Pt in Afib since early this morning. HRs in the 130s. BP 139/70 overall feels ok just knows she is in afib. She has taken 2 extra '25mg'$  doses of metoprolol succinate since this morning. Her HRs are between 80-130. Pt states she normally converts on her own but PIP flecainide was discontinued last year due to a syncopal episode after the dose.  Discussed with Adline Peals PA will call in PRN cardizem for elevated rates and follow up tomorrow morning. Pt in agreement and verbalized understanding of instructions. ER precautions were reviewed as well.

## 2022-06-27 ENCOUNTER — Ambulatory Visit (HOSPITAL_COMMUNITY): Payer: Commercial Managed Care - PPO | Admitting: Physician Assistant

## 2022-07-05 ENCOUNTER — Ambulatory Visit
Admission: RE | Admit: 2022-07-05 | Discharge: 2022-07-05 | Disposition: A | Discharge status: Home / Self Care | Payer: Commercial Managed Care - PPO | Source: Ambulatory Visit | Attending: Sports Medicine | Admitting: Sports Medicine

## 2022-07-05 ENCOUNTER — Other Ambulatory Visit: Payer: Self-pay | Admitting: Sports Medicine

## 2022-07-05 ENCOUNTER — Encounter: Payer: Self-pay | Admitting: Sports Medicine

## 2022-07-05 DIAGNOSIS — M25551 Pain in right hip: Secondary | ICD-10-CM

## 2022-07-05 DIAGNOSIS — M1611 Unilateral primary osteoarthritis, right hip: Secondary | ICD-10-CM

## 2022-07-05 NOTE — Progress Notes (Unsigned)
New order placed

## 2022-07-09 ENCOUNTER — Ambulatory Visit: Payer: Commercial Managed Care - PPO | Admitting: Sports Medicine

## 2022-07-17 ENCOUNTER — Other Ambulatory Visit: Payer: Self-pay

## 2022-07-19 ENCOUNTER — Ambulatory Visit
Admission: RE | Admit: 2022-07-19 | Discharge: 2022-07-19 | Disposition: A | Discharge status: Home / Self Care | Payer: Commercial Managed Care - PPO | Source: Ambulatory Visit | Attending: Sports Medicine | Admitting: Sports Medicine

## 2022-07-19 DIAGNOSIS — M1611 Unilateral primary osteoarthritis, right hip: Secondary | ICD-10-CM

## 2022-07-19 DIAGNOSIS — M25551 Pain in right hip: Secondary | ICD-10-CM

## 2022-07-19 MED ORDER — IOPAMIDOL (ISOVUE-M 200) INJECTION 41%
15.0000 mL | Freq: Once | INTRAMUSCULAR | Status: AC
  Administered 2022-07-19: 15 mL via INTRA_ARTICULAR

## 2022-07-20 NOTE — Progress Notes (Signed)
Benito Mccreedy D.[place] Phone: [phone]   Assessment and Plan:     There are no diagnoses linked to this encounter.  ***   Pertinent previous records reviewed include ***   Follow Up: ***     Subjective:   I, Julie Clark, am serving as a Education administrator for Doctor Glennon Mac   Chief Complaint: right hip pain    HPI:  08/30/21 Patient is a 67 year old female complaining of right hip pain. Patient states that its been going on for several years but that would come and go due to weather but with in the last 6 months it has gotten worse does have chron's disease so doesn't know if that is affecting her hip. No numbness tingling, lifting the leg hurts the worst having to flex to sit getting into the car , has the most pain when she is at work, when she is at home she feels better, tylenol takes the bite out of it , no MOI , has a hx of Hemorids    09/12/2021 Patient states that she is doing good, is there a particular shoe that she should be wearing , especially at work    02/20/2022 Patient states that she wants a csi , lasted about  3 months     06/08/2022 Patient states wants a csi the last one didn't work   07/24/2022 Patient states    Relevant Historical Information: Crohn's disease, on chronic anticoagulation with Eliquis, hypertension  Additional pertinent review of systems negative.   Current Outpatient Medications:    acetaminophen (TYLENOL) 500 MG tablet, Take 500 mg by mouth every 6 (six) hours as needed., Disp: , Rfl:    apixaban (ELIQUIS) 5 MG TABS tablet, Take 1 tablet (5 mg total) by mouth 2 (two) times daily., Disp: 180 tablet, Rfl: 1   azaTHIOprine (IMURAN) 50 MG tablet, Take 3 tablets (150 mg total) by mouth daily with lunch., Disp: 270 tablet, Rfl: 2   diltiazem (CARDIZEM CD) 120 MG 24 hr capsule, Take 1 capsule (120 mg total) by mouth daily., Disp: 90 capsule, Rfl: 3    diltiazem (CARDIZEM) 30 MG tablet, Take 1 tablet every 4 hours AS NEEDED for AFIB heart rate >100 as long as top BP >100., Disp: 30 tablet, Rfl: 1   folic acid (FOLVITE) 1 MG tablet, Take 1 tablet (1 mg total) by mouth daily., Disp: 90 tablet, Rfl: 1   inFLIXimab (REMICADE IV), Inject into the vein. Every 6 weeks, Disp: , Rfl:    metoprolol succinate (TOPROL-XL) 25 MG 24 hr tablet, Take 1 tablet (25 mg total) by mouth 2 (two) times daily., Disp: 180 tablet, Rfl: 0   Polyethylene Glycol 3350 (MIRALAX PO), Take 1 Package by mouth daily. Mix powder with liquid and take once daily, Disp: , Rfl:  No current facility-administered medications for this visit.  Facility-Administered Medications Ordered in Other Visits:    6 CHG cloth bath night before surgery, , , Once **AND** 6 CHG cloth bath AM of surgery, , , Once **AND** Chlorhexidine Gluconate Cloth 2 % PADS 6 each, 6 each, Topical, Once **AND** Chlorhexidine Gluconate Cloth 2 % PADS 6 each, 6 each, Topical, Once, Michael Boston, MD   Objective:     There were no vitals filed for this visit.    There is no height or weight on file to calculate BMI.    Physical Exam:    ***  Electronically signed by:  Benito Mccreedy D.[facility] 7:44 AM 07/20/22

## 2022-07-24 ENCOUNTER — Ambulatory Visit (INDEPENDENT_AMBULATORY_CARE_PROVIDER_SITE_OTHER): Payer: Commercial Managed Care - PPO | Admitting: Sports Medicine

## 2022-07-24 VITALS — BP 130/80 | HR 73 | Ht 64.0 in | Wt 179.0 lb

## 2022-07-24 DIAGNOSIS — M1611 Unilateral primary osteoarthritis, right hip: Secondary | ICD-10-CM

## 2022-07-24 DIAGNOSIS — S73191A Other sprain of right hip, initial encounter: Secondary | ICD-10-CM

## 2022-07-24 DIAGNOSIS — M25551 Pain in right hip: Secondary | ICD-10-CM | POA: Diagnosis not present

## 2022-07-24 NOTE — Patient Instructions (Addendum)
Good to see you Hip HEP  Recommend low impact exercises, water aerobics, elliptical, stationary bike  Tylenol [phone] mg 2-3 times a day for pain relief  3 month follow up or sooner if flare of pain

## 2022-07-31 ENCOUNTER — Ambulatory Visit (INDEPENDENT_AMBULATORY_CARE_PROVIDER_SITE_OTHER): Payer: Commercial Managed Care - PPO

## 2022-07-31 VITALS — BP 153/75 | HR 55 | Temp 98.0°F | Resp 18 | Ht 64.0 in | Wt 178.0 lb

## 2022-07-31 DIAGNOSIS — K50114 Crohn's disease of large intestine with abscess: Secondary | ICD-10-CM | POA: Diagnosis not present

## 2022-07-31 DIAGNOSIS — K50119 Crohn's disease of large intestine with unspecified complications: Secondary | ICD-10-CM

## 2022-07-31 MED ORDER — METHYLPREDNISOLONE SODIUM SUCC 40 MG IJ SOLR: 40.0000 mg | Freq: Once | INTRAMUSCULAR | Status: DC

## 2022-07-31 MED ORDER — DIPHENHYDRAMINE HCL 25 MG PO CAPS: 25.0000 mg | ORAL_CAPSULE | Freq: Once | ORAL | Status: DC

## 2022-07-31 MED ORDER — SODIUM CHLORIDE 0.9 % IV SOLN
10.0000 mg/kg | Freq: Once | INTRAVENOUS | Status: AC
  Administered 2022-07-31: 800 mg via INTRAVENOUS
  Filled 2022-07-31: qty 80

## 2022-07-31 MED ORDER — ACETAMINOPHEN 325 MG PO TABS: 650.0000 mg | ORAL_TABLET | Freq: Once | ORAL | Status: DC

## 2022-07-31 NOTE — Progress Notes (Signed)
Diagnosis: Crohn's Disease  Provider:  Marshell Garfinkel MD  Procedure: Infusion  IV Type: Peripheral, IV Location: L Antecubital  Avsola (infliximab-axxq), Dose: 800 mg  Infusion Start Time: [id number]  Infusion Stop Time: 1227  Post Infusion IV Care: Patient declined observation and Peripheral IV Discontinued  Discharge: Condition: Good, Destination: Home . AVS Declined  Performed by:  Binnie Kand, RN

## 2022-08-01 ENCOUNTER — Encounter: Payer: Self-pay | Admitting: Cardiology

## 2022-08-01 ENCOUNTER — Ambulatory Visit: Payer: Commercial Managed Care - PPO | Attending: Cardiology | Admitting: Cardiology

## 2022-08-01 VITALS — BP 140/78 | HR 68 | Ht 64.0 in | Wt 178.0 lb

## 2022-08-01 DIAGNOSIS — R0681 Apnea, not elsewhere classified: Secondary | ICD-10-CM

## 2022-08-01 DIAGNOSIS — I480 Paroxysmal atrial fibrillation: Secondary | ICD-10-CM

## 2022-08-01 DIAGNOSIS — R0683 Snoring: Secondary | ICD-10-CM

## 2022-08-01 DIAGNOSIS — R400 Somnolence: Secondary | ICD-10-CM

## 2022-08-01 DIAGNOSIS — D6869 Other thrombophilia: Secondary | ICD-10-CM

## 2022-08-01 MED ORDER — APIXABAN 5 MG PO TABS
5.0000 mg | ORAL_TABLET | Freq: Two times a day (BID) | ORAL | 2 refills | Status: DC
  Filled 2022-08-01 – 2022-10-17 (×2): qty 180, 90d supply, fill #0
  Filled 2023-01-14: qty 180, 90d supply, fill #1
  Filled 2023-04-18: qty 180, 90d supply, fill #2

## 2022-08-01 MED ORDER — DILTIAZEM HCL ER COATED BEADS 120 MG PO CP24
120.0000 mg | ORAL_CAPSULE | Freq: Every day | ORAL | 2 refills | Status: DC
  Filled 2022-08-01 – 2022-09-19 (×2): qty 90, 90d supply, fill #0
  Filled 2022-12-21: qty 90, 90d supply, fill #1
  Filled 2023-03-19: qty 90, 90d supply, fill #2

## 2022-08-01 NOTE — Progress Notes (Signed)
Electrophysiology Office Note   Date:  08/01/2022   ID:  Julie Clark, DOB 10-12-1955, MRN [id number]  PCP:  Janith Lima, MD  Primary Electrophysiologist:  Constance Haw, MD    No chief complaint on file.    History of Present Illness: Julie Clark is a 67 y.o. female who presents today for electrophysiology evaluation.    She has a history of hypertension, hyperlipidemia and she has heart murmur. She presented to the emergency room in February with low blood pressure. She was found to be in atrial fibrillation. She was given beta blockers which converted to sinus rhythm.  Another episode of atrial fibrillation in August 2018.  She was put on both metoprolol and Cardizem.  She did convert to sinus rhythm without intervention.  She had an episode of syncope on 09/15/21 which prompted ED evaluation. She reports to have been in Afib that morning at work and took flecainide (pill in the pocket). She was found slumped over in her chair unresponsive about an hour after taking the flecainide dose. ECG was found to show sinus rhythm and occasional PVCs in a pattern of bigeminy. Her flecainide was discontinued after this episode. She remained on diltiazem and Toprol and eliquis.  On follow up today, she notes to have had an episode of Afib on 06/27/22 and felt to be out of rhythm most of the day. She called the Afib clinic and received a prescription of diltiazem 30 mg prn and it helped her get back into SR about ~2 hours later. This is the only episode since the syncopal event in April of 2023. She has remained compliant with her diltiazem and metoprolol and eliquis. She notes to be tired sometimes even when she is not in Afib. No bleeding concerns. No further episodes of syncope.  Today, denies symptoms of palpitations, chest pain, shortness of breath, orthopnea, PND, lower extremity edema, claudication, dizziness, presyncope, syncope, bleeding, or neurologic sequela. The patient is  tolerating medications without difficulties.    Past Medical History:  Diagnosis Date   Anticoagulant long-term use    eliquis, managed by cardiology   Crohn's disease of per[facility]al [facility])    new dx 05/ 2019 by dr Ardis Hughs ([facility])   Heart murmur    Hemorrhoids    internal and external   History of hidradenitis suppurativa    axilla   Hyperlipidemia    Hypertension    PAF (paroxysmal atrial fibrillation) [facility])    cardiologist-  dr Curt Bears--  first dx 07-18-2015   Rectal pain    Uterine fibroid    per MRI 09-16-2017   Wears glasses    Wears glasses    Past Surgical History:  Procedure Laterality Date   CESAREAN SECTION  x2  last one [place]  last one 05-14-2018  dr d. Ardis Hughs @ [facility]   INCISION AND DRAINAGE ABSCESS N/A 10/11/2017   Procedure: DRAINAGE OF PERIRECTAL ABSCESSES;  Surgeon: Michael Boston, MD;  Location: [place];  Service: General;  Laterality: N/A;   [place] N/A 10/11/2017   Procedure: PLACEMENT OF SETON;  Surgeon: Michael Boston, MD;  Location: [facility];  Service: General;  Laterality: N/A;   PLACEMENT OF SETON N/A 01/23/2019   Procedure: PLACEMENT OF SETONS x4;  Surgeon: Ileana Roup, MD;  Location: [facility];  Service: General;  Laterality: N/A;   RECTAL EXAM UNDER ANESTHESIA  N/A 10/11/2017   Procedure: ANORECTAL EXAM UNDER ANESTHESIA;  Surgeon: Michael Boston, MD;  Location: [facility];  Service: General;  Laterality: N/A;   RECTAL EXAM UNDER ANESTHESIA N/A 01/23/2019   Procedure: ANORECTAL EXAM UNDER ANESTHESIA,  INCISION AND DRAINAGE x2;  Surgeon: Ileana Roup, MD;  Location: [place];  Service: General;  Laterality: N/A;   TRANSTHORACIC ECHOCARDIOGRAM  10/08/2012   ef 50-55%,  grade 1 diastolic dysfunction/  trivial MR and TR/  mild LAE     Current Outpatient Medications  Medication Sig Dispense  Refill   acetaminophen (TYLENOL) 500 MG tablet Take 500 mg by mouth every 6 (six) hours as needed.     azaTHIOprine (IMURAN) 50 MG tablet Take 3 tablets (150 mg total) by mouth daily with lunch. 270 tablet 2   diltiazem (CARDIZEM) 30 MG tablet Take 1 tablet every 4 hours AS NEEDED for AFIB heart rate >100 as long as top BP >100. 30 tablet 1   folic acid (FOLVITE) 1 MG tablet Take 1 tablet (1 mg total) by mouth daily. 90 tablet 1   inFLIXimab (REMICADE IV) Inject into the vein. Every 6 weeks     metoprolol succinate (TOPROL-XL) 25 MG 24 hr tablet Take 1 tablet (25 mg total) by mouth 2 (two) times daily. 180 tablet 0   Polyethylene Glycol 3350 (MIRALAX PO) Take 1 Package by mouth daily. Mix powder with liquid and take once daily     apixaban (ELIQUIS) 5 MG TABS tablet Take 1 tablet (5 mg total) by mouth 2 (two) times daily. 180 tablet 2   diltiazem (CARDIZEM CD) 120 MG 24 hr capsule Take 1 capsule (120 mg total) by mouth daily. 90 capsule 2   No current facility-administered medications for this visit.   Facility-Administered Medications Ordered in Other Visits  Medication Dose Route Frequency Provider Last Rate Last Admin   Chlorhexidine Gluconate Cloth 2 % PADS 6 each  6 each Topical Once Michael Boston, MD       And   Chlorhexidine Gluconate Cloth 2 % PADS 6 each  6 each Topical Once Michael Boston, MD        Allergies:   Latex, Lovastatin, Flecainide, Protonix [pantoprazole], Adhesive [tape], and Penicillins   Social History:  The patient  reports that she has never smoked. She has never used smokeless tobacco. She reports that she does not drink alcohol and does not use drugs.   Family History:  The patient's family history includes Colon cancer in her maternal aunt; Hyperlipidemia in her mother; Hypertension in her mother; Prostate cancer in her father; Stroke in her mother; Thyroid disease in her mother.    ROS:  Please see the history of present illness.   Otherwise, review of systems  is positive for none.   All other systems are reviewed and negative.   PHYSICAL EXAM: VS:  BP (!) 140/78   Pulse 68   Ht '5\' 4"'$  (1.626 m)   Wt 178 lb (80.7 kg)   SpO2 99%   BMI 30.55 kg/m  , BMI Body mass index is 30.55 kg/m. GEN: Well nourished, well developed, in no acute distress  HEENT: normal  Neck: no JVD, carotid bruits, or masses Cardiac: RRR; no murmurs, rubs, or gallops,no edema  Respiratory:  clear to auscultation bilaterally, normal work of breathing GI: soft, nontender, nondistended, + BS MS: no deformity or atrophy  Skin: warm and dry Neuro:  Strength and sensation are intact Psych: euthymic mood, full  affect  EKG:  EKG is ordered today. Personal review of the ekg ordered shows SR with HR 68.  Recent Labs: 09/15/2021: Magnesium 1.8; TSH 0.589 05/23/2022: ALT 26; BUN 13; Creatinine, Ser 0.71; Hemoglobin 13.1; Platelets 418.0; Potassium 4.0; Sodium 140    Lipid Panel     Component Value Date/Time   CHOL 236 (H) 11/02/2021 0901   TRIG 152.0 (H) 11/02/2021 0901   HDL 48.00 11/02/2021 0901   CHOLHDL 5 11/02/2021 0901   VLDL 30.4 11/02/2021 0901   LDLCALC 157 (H) 11/02/2021 0901     Wt Readings from Last 3 Encounters:  08/01/22 178 lb (80.7 kg)  07/31/22 178 lb (80.7 kg)  07/24/22 179 lb (81.2 kg)      Other studies Reviewed: Additional studies/ records that were reviewed today include: TTE 09/25/21 Review of the above records today demonstrates:   1. Left ventricular ejection fraction, by estimation, is 50 to 55%. The  left ventricle has low normal function. The left ventricle has no regional  wall motion abnormalities. Left ventricular diastolic parameters are  consistent with Grade II diastolic  dysfunction (pseudonormalization).   2. Right ventricular systolic function is normal. The right ventricular  size is normal. There is normal pulmonary artery systolic pressure. The  estimated right ventricular systolic pressure is [id number] mmHg.   3. The mitral  valve is normal in structure. No evidence of mitral valve  regurgitation. No evidence of mitral stenosis.   4. The aortic valve is tricuspid. Aortic valve regurgitation is not  visualized. No aortic stenosis is present.   5. Frequent PVCs through study.   Comparison(s): LVEF is slightly lower from 2014 report, PVCs not noted in  prior report.    ASSESSMENT AND PLAN:  1.   Paroxysmal atrial fibrillation: Currently on Eliquis and diltiazem and toprol. She was on flecainide pill in the pocket but this was discontinued after syncopal event. CHA2DS2-VASc of 3.    She is in SR today but interested in trying to prevent Afib from recurring again. Discussion medication management versus ablation and procedure explained in detail. She would like to think about her options and discuss with family. We Julie Clark plan to see her in 6 months for follow up but she can call sooner if decision has been made.  Continue current medication regimen without any changes.  2. Hypertension: Stable, continue regimen  3. Possible sleep apnea  She endorses tiredness and fatigue. Husband has reported she stops breathing and snores loudly.  We Mireyah Chervenak set up sleep study to determine if she has OSA. Julie Clark call to schedule.  Current medicines are reviewed at length with the patient today.   The patient does not have concerns regarding her medicines.  The following changes were made today: None  Labs/ tests ordered today include:  Orders Placed This Encounter  Procedures   EKG 12-Lead   Nocturnal polysomnography (NPSG)     Disposition:   FU with Zale Marcotte 6 months.  Signed, Evangelina Delancey Meredith Leeds, MD  08/01/2022 5:01 PM     [facility] [address] [place] [address] [phone] (office) [phone] (fax)  I have seen and examined this patient with Emily Filbert.  Agree with above, note added to reflect my findings.  Patient presents today for workup of her atrial fibrillation.   She has had 2 episodes of atrial fibrillation last year.  She had 1 in April when she was at work.  She had an episode of syncope.  Her  flecainide at that time was stopped.  She had another episode at the end of January.  She was in atrial fibrillation for most of the day.  She took a dose of diltiazem as needed and converted to sinus rhythm after few hours.  She is quite concerned about her episodes of atrial fibrillation.  She would prefer a rhythm control strategy potentially.  GEN: Well nourished, well developed, in no acute distress  HEENT: normal  Neck: no JVD, carotid bruits, or masses Cardiac: RRR; no murmurs, rubs, or gallops,no edema  Respiratory:  clear to auscultation bilaterally, normal work of breathing GI: soft, nontender, nondistended, + BS MS: no deformity or atrophy  Skin: warm and dry Neuro:  Strength and sensation are intact Psych: euthymic mood, full affect   Paroxysmal atrial fibrillation: Remains in sinus rhythm.  She has had 2 episodes of atrial fibrillation in the last year.  Despite this, she would prefer potentially a rhythm control strategy.  She would like to avoid long-term medications.  We did discuss the possibility of ablation.  She Lillyan Hitson think about this and let us know if she wants to proceed. Secondary hypercoagulable state: Currently on Eliquis for atrial fibrillation Snoring/witnessed apnea: Potentially due to sleep apnea.  Cache Decoursey plan for sleep study.  Yakelin Grenier M. [place] MD 08/01/2022 5:01 PM

## 2022-08-01 NOTE — Patient Instructions (Addendum)
Medication Instructions:  Your physician recommends that you continue on your current medications as directed. Please refer to the Current Medication list given to you today.  *If you need a refill on your cardiac medications before your next appointment, please call your pharmacy*   Lab Work: None ordered If you have labs (blood work) drawn today and your tests are completely normal, you will receive your results only by: [place] (if you have MyChart) OR A paper copy in the mail If you have any lab test that is abnormal or we need to change your treatment, we will call you to review the results.   Testing/Procedures: Your physician has recommended that you have a sleep study. This test records several body functions during sleep, including: brain activity, eye movement, oxygen and carbon dioxide blood levels, heart rate and rhythm, breathing rate and rhythm, the flow of air through your mouth and nose, snoring, body muscle movements, and chest and belly movement.   Follow-Up: At [facility], you and your health needs are our priority.  As part of our continuing mission to provide you with exceptional heart care, we have created designated Provider Care Teams.  These Care Teams include your primary Cardiologist (physician) and Advanced Practice Providers (APPs -  Physician Assistants and Nurse Practitioners) who all work together to provide you with the care you need, when you need it.   Your next appointment:   6 month(s)  The format for your next appointment:   In Person  Provider:   Allegra Lai, MD    Thank you for choosing [facility]!!   Trinidad Curet, RN [phone]  Other Instructions  Cardiac Ablation Cardiac ablation is a procedure to destroy (ablate) heart tissue that is sending bad signals. These bad signals cause the heart to beat very fast or in a way that is not normal. Destroying some tissues can help make the heart rhythm normal. Tell your  doctor about: Any allergies you have. All medicines you are taking. These include vitamins, herbs, eye drops, creams, and over-the-counter medicines. Any problems you or family members have had with anesthesia. Any bleeding problems you have. Any surgeries you have had. Any medical conditions you have. Whether you are pregnant or may be pregnant. What are the risks? Your doctor will talk with you about risks. These may include: Infection. Bruising and bleeding. Stroke or blood clots. Damage to nearby areas of your body. Allergies to medicines or dyes. Needing a pacemaker if the heart gets damaged. A pacemaker helps the heart beat normally. The procedure not working. What happens before the procedure? Medicines Ask your doctor about changing or stopping: Your normal medicines. Vitamins, herbs, and supplements. Over-the-counter medicines. Do not take aspirin or ibuprofen unless you are told to. General instructions Follow instructions from your doctor about what you may eat and drink. If you will be going home right after the procedure, plan to have a responsible adult: Take you home from the hospital or clinic. You will not be allowed to drive. Care for you for the time you are told. Ask your doctor what steps will be taken to prevent the spread of germs. What happens during the procedure?  An IV tube will be put into one of your veins. You may be given: A sedative. This helps you relax. Anesthesia. This will: Numb certain areas of your body. The skin on your neck or groin will be numbed. A cut (incision) will be made in your neck or groin. A  needle will be put through the cut and into a large vein. The small, thin tube (catheter) will be put into the needle. The tube will be moved to your heart. A type of X-ray (fluoroscopy) will be used to help guide the tube. It will also show constant images of the heart on a screen. Dye may be put through the tube. This helps your doctor  see your heart. An electric current will be sent from the tube to destroy heart tissue in certain areas. The tube will be taken out. Pressure will be held on your cut. This helps stop bleeding. A bandage (dressing) will be put over your cut. The procedure may vary among doctors and hospitals. What happens after the procedure? You will be monitored until you leave the hospital or clinic. This includes checking your blood pressure, heart rate and rhythm, breathing rate, and blood oxygen level. Your cut will be checked for bleeding. You will need to lie still for a few hours. If your groin was used, you will need to keep your leg straight for a few hours after the small, thin tube is removed. This information is not intended to replace advice given to you by your health care provider. Make sure you discuss any questions you have with your health care provider. Document Revised: 10/31/2021 Document Reviewed: 10/31/2021 Elsevier Patient Education  [place].

## 2022-08-02 ENCOUNTER — Other Ambulatory Visit (HOSPITAL_COMMUNITY): Payer: Self-pay

## 2022-08-21 ENCOUNTER — Other Ambulatory Visit (HOSPITAL_COMMUNITY): Payer: Self-pay

## 2022-09-11 ENCOUNTER — Other Ambulatory Visit: Payer: Self-pay | Admitting: Internal Medicine

## 2022-09-11 ENCOUNTER — Other Ambulatory Visit (HOSPITAL_COMMUNITY): Payer: Self-pay

## 2022-09-11 ENCOUNTER — Ambulatory Visit (INDEPENDENT_AMBULATORY_CARE_PROVIDER_SITE_OTHER): Payer: Commercial Managed Care - PPO

## 2022-09-11 VITALS — BP 173/79 | HR 52 | Temp 98.0°F | Resp 18 | Ht 64.0 in | Wt 177.0 lb

## 2022-09-11 DIAGNOSIS — K50119 Crohn's disease of large intestine with unspecified complications: Secondary | ICD-10-CM

## 2022-09-11 DIAGNOSIS — K50114 Crohn's disease of large intestine with abscess: Secondary | ICD-10-CM

## 2022-09-11 DIAGNOSIS — I10 Essential (primary) hypertension: Secondary | ICD-10-CM

## 2022-09-11 DIAGNOSIS — I480 Paroxysmal atrial fibrillation: Secondary | ICD-10-CM

## 2022-09-11 MED ORDER — DIPHENHYDRAMINE HCL 25 MG PO CAPS: 25.0000 mg | ORAL_CAPSULE | Freq: Once | ORAL | Status: DC

## 2022-09-11 MED ORDER — METHYLPREDNISOLONE SODIUM SUCC 40 MG IJ SOLR: 40.0000 mg | Freq: Once | INTRAMUSCULAR | Status: DC

## 2022-09-11 MED ORDER — ACETAMINOPHEN 325 MG PO TABS: 650.0000 mg | ORAL_TABLET | Freq: Once | ORAL | Status: DC

## 2022-09-11 MED ORDER — METOPROLOL SUCCINATE ER 25 MG PO TB24
25.0000 mg | ORAL_TABLET | Freq: Two times a day (BID) | ORAL | 0 refills | Status: DC
Start: 2022-09-11 — End: 2022-12-13
  Filled 2022-09-11: qty 180, 90d supply, fill #0

## 2022-09-11 MED ORDER — SODIUM CHLORIDE 0.9 % IV SOLN
10.0000 mg/kg | Freq: Once | INTRAVENOUS | Status: AC
  Administered 2022-09-11: 800 mg via INTRAVENOUS
  Filled 2022-09-11: qty 80

## 2022-09-11 NOTE — Progress Notes (Signed)
Diagnosis: Crohn's Disease  Provider:  Chilton Greathouse MD  Procedure: Infusion  IV Type: Peripheral, IV Location: L Antecubital  Avsola (infliximab-axxq), Dose: 800 mg  Infusion Start Time: 0942  Infusion Stop Time: 1203  Post Infusion IV Care: Peripheral IV Discontinued  Discharge: Condition: Good, Destination: Home . AVS Declined  Performed by:  Marlow Baars [place], RN

## 2022-09-19 ENCOUNTER — Other Ambulatory Visit (HOSPITAL_COMMUNITY): Payer: Self-pay

## 2022-09-25 ENCOUNTER — Telehealth (HOSPITAL_BASED_OUTPATIENT_CLINIC_OR_DEPARTMENT_OTHER): Payer: Self-pay

## 2022-09-25 NOTE — Telephone Encounter (Signed)
Per secure chat from [employer] RN, ok to cancel sleep study request due to unreturned messages from patient to schedule sleep study. Jim Like MHA RN CCM

## 2022-10-16 ENCOUNTER — Telehealth: Payer: Self-pay | Admitting: *Deleted

## 2022-10-16 DIAGNOSIS — R400 Somnolence: Secondary | ICD-10-CM

## 2022-10-16 DIAGNOSIS — R0683 Snoring: Secondary | ICD-10-CM

## 2022-10-16 DIAGNOSIS — R0681 Apnea, not elsewhere classified: Secondary | ICD-10-CM

## 2022-10-16 NOTE — Telephone Encounter (Signed)
Pt aware new sleep study order being placed. Aware I will ensure staff is leaving a message, if necessary, when calling to schedule this testing. Patient verbalized understanding and agreeable to plan.

## 2022-10-17 ENCOUNTER — Other Ambulatory Visit: Payer: Self-pay

## 2022-10-17 ENCOUNTER — Other Ambulatory Visit (HOSPITAL_COMMUNITY): Payer: Self-pay

## 2022-10-23 ENCOUNTER — Ambulatory Visit (INDEPENDENT_AMBULATORY_CARE_PROVIDER_SITE_OTHER): Payer: Commercial Managed Care - PPO

## 2022-10-23 VITALS — BP 150/53 | HR 51 | Temp 98.1°F | Resp 16 | Ht 64.0 in | Wt 180.4 lb

## 2022-10-23 DIAGNOSIS — K50119 Crohn's disease of large intestine with unspecified complications: Secondary | ICD-10-CM

## 2022-10-23 DIAGNOSIS — K50114 Crohn's disease of large intestine with abscess: Secondary | ICD-10-CM | POA: Diagnosis not present

## 2022-10-23 MED ORDER — SODIUM CHLORIDE 0.9 % IV SOLN
10.0000 mg/kg | Freq: Once | INTRAVENOUS | Status: AC
  Administered 2022-10-23: 800 mg via INTRAVENOUS
  Filled 2022-10-23: qty 80

## 2022-10-23 MED ORDER — METHYLPREDNISOLONE SODIUM SUCC 40 MG IJ SOLR: 40.0000 mg | Freq: Once | INTRAMUSCULAR | Status: DC

## 2022-10-23 MED ORDER — DIPHENHYDRAMINE HCL 25 MG PO CAPS: 25.0000 mg | ORAL_CAPSULE | Freq: Once | ORAL | Status: DC

## 2022-10-23 MED ORDER — ACETAMINOPHEN 325 MG PO TABS: 650.0000 mg | ORAL_TABLET | Freq: Once | ORAL | Status: DC

## 2022-10-23 NOTE — Progress Notes (Signed)
Diagnosis: Crohn's Disease  Provider:  Chilton Greathouse MD  Procedure: IV Infusion  IV Type: Peripheral, IV Location: L Antecubital  Avsola (infliximab-axxq), Dose: 800 mg  Infusion Start Time: 1141  Infusion Stop Time: 1158  Post Infusion IV Care: Peripheral IV Discontinued  Discharge: Condition: Good, Destination: Home . AVS Declined  Performed by:  Loney Hering, LPN

## 2022-10-26 ENCOUNTER — Telehealth: Payer: Self-pay | Admitting: Pharmacy Technician

## 2022-10-26 NOTE — Telephone Encounter (Signed)
F/u:  Patient had questions regarding bill. Patient does not have voicemail set up. Will attempt to reach patient again.

## 2022-11-20 NOTE — Addendum Note (Signed)
Addended by: Brunetta Genera on: 11/20/2022 09:34 AM   Modules accepted: Orders

## 2022-12-04 ENCOUNTER — Ambulatory Visit (INDEPENDENT_AMBULATORY_CARE_PROVIDER_SITE_OTHER): Payer: Commercial Managed Care - PPO

## 2022-12-04 VITALS — BP 157/72 | HR 56 | Temp 97.8°F | Resp 12 | Ht 64.0 in | Wt 182.4 lb

## 2022-12-04 DIAGNOSIS — K50114 Crohn's disease of large intestine with abscess: Secondary | ICD-10-CM

## 2022-12-04 DIAGNOSIS — K50119 Crohn's disease of large intestine with unspecified complications: Secondary | ICD-10-CM

## 2022-12-04 MED ORDER — SODIUM CHLORIDE 0.9 % IV SOLN
10.0000 mg/kg | Freq: Once | INTRAVENOUS | Status: AC
  Administered 2022-12-04: 800 mg via INTRAVENOUS
  Filled 2022-12-04: qty 80

## 2022-12-04 MED ORDER — ACETAMINOPHEN 325 MG PO TABS: 650.0000 mg | ORAL_TABLET | Freq: Once | ORAL | Status: DC

## 2022-12-04 MED ORDER — METHYLPREDNISOLONE SODIUM SUCC 40 MG IJ SOLR: 40.0000 mg | Freq: Once | INTRAMUSCULAR | Status: DC

## 2022-12-04 MED ORDER — DIPHENHYDRAMINE HCL 25 MG PO CAPS: 25.0000 mg | ORAL_CAPSULE | Freq: Once | ORAL | Status: DC

## 2022-12-04 NOTE — Progress Notes (Signed)
Diagnosis: Crohn's Disease  Provider:  Chilton Greathouse MD  Procedure: IV Infusion  IV Type: Peripheral, IV Location: Left wrist  Avsola (infliximab-axxq), Dose: 800mg   Infusion Start Time: 0913  Infusion Stop Time: 1129  Post Infusion IV Care: Patient declined observation and Peripheral IV Discontinued  Discharge: Condition: Good, Destination: Home . AVS Declined  Performed by:  Nat Math, RN

## 2022-12-05 ENCOUNTER — Telehealth: Payer: Self-pay

## 2022-12-05 ENCOUNTER — Other Ambulatory Visit: Payer: Self-pay

## 2022-12-05 DIAGNOSIS — K50119 Crohn's disease of large intestine with unspecified complications: Secondary | ICD-10-CM

## 2022-12-05 NOTE — Telephone Encounter (Signed)
Patient scheduled to see Dr. Rhea Belton on 01/02/23 at 10:50 am. Labs put in Epic for patient to have labs drawn prior to infusion.

## 2022-12-13 ENCOUNTER — Other Ambulatory Visit: Payer: Self-pay | Admitting: Internal Medicine

## 2022-12-13 ENCOUNTER — Other Ambulatory Visit (HOSPITAL_COMMUNITY): Payer: Self-pay

## 2022-12-13 DIAGNOSIS — I10 Essential (primary) hypertension: Secondary | ICD-10-CM

## 2022-12-13 DIAGNOSIS — I480 Paroxysmal atrial fibrillation: Secondary | ICD-10-CM

## 2022-12-14 ENCOUNTER — Other Ambulatory Visit (HOSPITAL_COMMUNITY): Payer: Self-pay

## 2022-12-17 ENCOUNTER — Other Ambulatory Visit (HOSPITAL_COMMUNITY): Payer: Self-pay

## 2022-12-17 ENCOUNTER — Other Ambulatory Visit: Payer: Self-pay

## 2022-12-17 ENCOUNTER — Other Ambulatory Visit: Payer: Self-pay | Admitting: Internal Medicine

## 2022-12-17 DIAGNOSIS — I480 Paroxysmal atrial fibrillation: Secondary | ICD-10-CM

## 2022-12-17 DIAGNOSIS — I10 Essential (primary) hypertension: Secondary | ICD-10-CM

## 2022-12-17 MED ORDER — METOPROLOL SUCCINATE ER 25 MG PO TB24
25.0000 mg | ORAL_TABLET | Freq: Two times a day (BID) | ORAL | 0 refills | Status: DC
Start: 2022-12-17 — End: 2023-03-12
  Filled 2022-12-17: qty 180, 90d supply, fill #0

## 2022-12-20 ENCOUNTER — Other Ambulatory Visit: Payer: Self-pay | Admitting: Internal Medicine

## 2022-12-20 DIAGNOSIS — Z1231 Encounter for screening mammogram for malignant neoplasm of breast: Secondary | ICD-10-CM

## 2022-12-21 ENCOUNTER — Other Ambulatory Visit (HOSPITAL_COMMUNITY): Payer: Self-pay

## 2022-12-26 ENCOUNTER — Encounter (INDEPENDENT_AMBULATORY_CARE_PROVIDER_SITE_OTHER): Payer: Self-pay

## 2022-12-27 DIAGNOSIS — L820 Inflamed seborrheic keratosis: Secondary | ICD-10-CM | POA: Diagnosis not present

## 2022-12-27 DIAGNOSIS — L821 Other seborrheic keratosis: Secondary | ICD-10-CM | POA: Diagnosis not present

## 2022-12-27 DIAGNOSIS — D485 Neoplasm of uncertain behavior of skin: Secondary | ICD-10-CM | POA: Diagnosis not present

## 2022-12-27 DIAGNOSIS — D2372 Other benign neoplasm of skin of left lower limb, including hip: Secondary | ICD-10-CM | POA: Diagnosis not present

## 2022-12-27 DIAGNOSIS — L815 Leukoderma, not elsewhere classified: Secondary | ICD-10-CM | POA: Diagnosis not present

## 2023-01-02 ENCOUNTER — Encounter: Payer: Self-pay | Admitting: Internal Medicine

## 2023-01-02 ENCOUNTER — Telehealth: Payer: Self-pay

## 2023-01-02 ENCOUNTER — Other Ambulatory Visit: Payer: Self-pay

## 2023-01-02 ENCOUNTER — Ambulatory Visit: Payer: Commercial Managed Care - PPO | Admitting: Internal Medicine

## 2023-01-02 ENCOUNTER — Other Ambulatory Visit (HOSPITAL_COMMUNITY): Payer: Self-pay

## 2023-01-02 VITALS — BP 166/70 | HR 74 | Ht 64.0 in | Wt 179.0 lb

## 2023-01-02 DIAGNOSIS — K50119 Crohn's disease of large intestine with unspecified complications: Secondary | ICD-10-CM

## 2023-01-02 DIAGNOSIS — K50114 Crohn's disease of large intestine with abscess: Secondary | ICD-10-CM

## 2023-01-02 DIAGNOSIS — Z7901 Long term (current) use of anticoagulants: Secondary | ICD-10-CM | POA: Diagnosis not present

## 2023-01-02 DIAGNOSIS — Z8639 Personal history of other endocrine, nutritional and metabolic disease: Secondary | ICD-10-CM

## 2023-01-02 DIAGNOSIS — E538 Deficiency of other specified B group vitamins: Secondary | ICD-10-CM

## 2023-01-02 MED ORDER — NA SULFATE-K SULFATE-MG SULF 17.5-3.13-1.6 GM/177ML PO SOLN
1.0000 | Freq: Once | ORAL | 0 refills | Status: AC
  Filled 2023-01-02: qty 354, 1d supply, fill #0

## 2023-01-02 MED ORDER — LEVOFLOXACIN 500 MG PO TABS
500.0000 mg | ORAL_TABLET | Freq: Every day | ORAL | 0 refills | Status: AC
  Filled 2023-01-02: qty 10, 10d supply, fill #0

## 2023-01-02 NOTE — Patient Instructions (Addendum)
We have sent the following medications to your pharmacy for you to pick up at your convenience: Levaquin.  Start taking your folate daily.   Continue remicade, azathioprine, and miralax.  [facility] has your order for the MRI of the pelvis. They prefer to contact you directly to screen you for the test. There phone number is [phone]. Dr. Rhea Belton would like this test scheduled after you have finished the antibiotics which is 10 days from starting the medication.   You have been scheduled for a colonoscopy. Please follow written instructions given to you at your visit today.   Please pick up your prep supplies at the pharmacy within the next 1-3 days.  If you use inhalers (even only as needed), please bring them with you on the day of your procedure.  DO NOT TAKE 7 DAYS PRIOR TO TEST- Trulicity (dulaglutide) Ozempic, Wegovy (semaglutide) Mounjaro (tirzepatide) Bydureon Bcise (exanatide extended release)  DO NOT TAKE 1 DAY PRIOR TO YOUR TEST Rybelsus (semaglutide) Adlyxin (lixisenatide) Victoza (liraglutide) Byetta (exanatide) ___________________________________________________________________________  _______________________________________________________  If your blood pressure at your visit was 140/90 or greater, please contact your primary care physician to follow up on this.  _______________________________________________________  If you are age 71 or older, your body mass index should be between 23-30. Your Body mass index is 30.73 kg/m. If this is out of the aforementioned range listed, please consider follow up with your Primary Care Provider.  If you are age 32 or younger, your body mass index should be between 19-25. Your Body mass index is 30.73 kg/m. If this is out of the aformentioned range listed, please consider follow up with your Primary Care Provider.   ________________________________________________________  [facility] providers would like  to encourage you to use [facility] to communicate with providers for non-urgent requests or questions.  Due to long hold times on the telephone, sending your provider a message by [facility] may be a faster and more efficient way to get a response.  Please allow 48 business hours for a response.  Please remember that this is for non-urgent requests.  _______________________________________________________

## 2023-01-02 NOTE — Telephone Encounter (Signed)
Pharmacy please advise on holding Eliquis  for 2 days prior to Endoscopy scheduled for 03/21/2023. Thank you.

## 2023-01-02 NOTE — Telephone Encounter (Signed)
Patient with diagnosis of atrial fibrillation on Eliquis for anticoagulation.    What type of surgery is being performed?     colonoscopy  When is this surgery scheduled?     03/21/23    CHA2DS2-VASc Score = 3   This indicates a 3.2% annual risk of stroke. The patient's score is based upon: CHF History: 0 HTN History: 1 Diabetes History: 0 Stroke History: 0 Vascular Disease History: 0 Age Score: 1 Gender Score: 1    CrCl 100 Platelet count 418  Per office protocol, patient can hold Eliquis for 2 days prior to procedure.   Patient will not need bridging with Lovenox (enoxaparin) around procedure.  **This guidance is not considered finalized until pre-operative APP has relayed final recommendations.**

## 2023-01-02 NOTE — Progress Notes (Signed)
Subjective:    Patient ID: Julie Clark, female    DOB: 11/24/1955, 67 y.o.   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  She is here alone today and was previously managed by Dr. Christella Hartigan prior to his medical leave.  She was last seen in the office on 09/18/2021.  Her last colonoscopy was performed on 05/06/2020.  At this exam her perineal disease was much improved and he removed her last seton.  There was a single site of colonic inflammation in the ascending colon where the lumen was somewhat strictured.  Overall improvement was much better.  The terminal ileum was normal in its examined segments.  Pathology = terminal ileum benign without acute inflammation; right: Benign without acute inflammation; ascending polyp was inflammatory; left: Benign without acute inflammation.  She has been maintained on infliximab since 2020.  She is on 10 mg/kg.  She is also maintained on azathioprine 150 mg daily.  She reports that clinically she feels well.  She reports her bowel movements are formed 1-2 times a day.  No blood in stool or melena.  No mucus in stool.  No abdominal pain.  She does take MiraLAX 17 g daily to help stools be soft but formed.  However, in the last few days to a week she has noticed soreness in her left buttocks.  She feels like there is a "cyst" there and it has intermittently drained.  She attributes this to seated cycling which she does for exercise.  Her last seton was removed at her last colonoscopy.  She has seen dermatology recently and had a benign lesion removed from her right lower extremity recently.  She was told to follow-up in 1 year.  She recalls prior Shingrix and Pneumovax and  influenza vaccinations.  The below taken from Dr. Larae Grooms note regarding her therapy in the past Dual therapy with immunomodulators (azathiaprine) and biologic (humira) started 09/2017.  Poor response of perianal disease led to drug level testing (01/2018) adalimumab was 5.6 (low) at trough, no antibodies detected.  Humira increased to 80mg  every other week.  Continued poor perianal response led to repeat drug testing 03/2018) adalimumab trough level was 6.8 (still lower than ideal) with no antibodies detected.  03/2018 Changed humira dosing to 40mg  once weekly, also increased azathiaprine to 150mg  once daily.   2020 despite 40 mg once weekly Humira her drug levels were lower than acceptable.  Changed to 5 mg/kg Remicade 2020. Remicade drug levels 08/2018: suboptimal (1.76mcg/mL at 5mg /kg remicade).  ADA undetectable.  Increased remicade to 10mg /kg  10 mg/kg Remicade drug levels still low, requested 10 mg/kg Remicade every 6 weeks however this was denied by her insurance company.  Formal appeals process started in late August 2020.  Eventually this was OK'd by her insurance company Exam under anesthesia 01/23/2019 with Dr. Marin Olp, incision and drainage of 2 small separate abscess cavities perianal.  Also placement of 4 separate, new perianal setons. Changing from brand name Remicade to brand-name avsola (still generic infliximab), October 2022  Review of Systems As per HPI, otherwise negative  Current Medications, Allergies, Past Medical History, Past Surgical History, Family History and Social History were reviewed in [employer] record.    Objective:   Physical  Exam BP (!) 166/70   Pulse 74   Ht 5\' 4"  (1.626 m)   Wt 179 lb (81.2 kg)   BMI 30.73 kg/m  Gen: awake, alert, NAD HEENT: anicteric  Abd: soft, NT/ND, +BS throughout Rectal: External exam only, there was a palpable fluctuance which is not draining but contains pus visibly, tenderness, this palpable  fluctuance is in the left mid buttocks apart from and not involving directly her anus Ext: no c/c/e Neuro: nonfocal  QuantiFERON gold negative last October We have plans for infliximab antibody, thiopurine metabolites, blood counts, liver enzymes and CRP next week at infliximab trough      Assessment & Plan:  67 year old female with a history of ileocolonic Crohn's disease with perianal involvement complicated by perianal fistulas requiring prior surgery and seton placement maintained on infliximab and azathioprine (dx 2019), prior low folate, atrial fibrillation on Eliquis who is seen for follow-up.   Colonic Crohn's disease with severe prior perianal disease/new perianal abscess (diagnosis 2019; previous adalimumab; now infliximab since 2020 10 mg/kg plus full dose azathioprine) --clinically her ileal and colonic disease appears to be well-controlled as she has no signs or symptoms of active ileitis or colitis.  However I am concerned on exam today as she has developed another perianal abscess and I am concerned about recurrent fistula.  This is required EUA and seton placement in the past.  We also need to assess disease activity and current therapeutic drug levels.  Treatment after thorough discussion as follows: -- Levofloxacin 500 mg daily x 10 days for perianal abscess -- MRI pelvis; evaluate for Crohn's fistulous; if present needs to see Dr. Cliffton Asters for EUA and seton placement -- For now continue infliximab 10 mg/kg; next week we will check infliximab level plus antibody test at trough -- Continue azathioprine 150 mg daily --check thiopurine metabolite panel -- Check CBC and CMP; CRP -- Colonoscopy recommended for disease activity assessment; we reviewed the risk, benefits and alternatives and she is agreeable wishes to proceed; see #3 -- At follow-up we will need to confirm that she is up-to-date with the newest Pneumovax as well as Shingrix.  She also will get flu vaccination this fall --  Annual dermatology recommended  2.  History of folate deficiency --needs to resume oral folate  3.  Chronic anticoagulation -- Will hold Eliquis 2 days prior to endoscopic procedures - will instruct when and how to resume after procedure. Benefits and risks of procedure explained including risks of bleeding, perforation, infection, missed lesions, reactions to medications and possible need for hospitalization and surgery for complications. Additional rare but real risk of stroke or other vascular clotting events off Eliquis also explained and need to seek urgent help if any signs of these problems occur. Will communicate by phone or EMR with patient's  prescribing provider to confirm that holding Eliquis is reasonable in this case.   40 minutes total spent today including patient facing time, coordination of care, reviewing medical history/procedures/pertinent radiology studies, and documentation of the encounter.

## 2023-01-02 NOTE — Telephone Encounter (Signed)
   Patient Name: Julie Clark  DOB: Mar 23, 1956 MRN: [id number]  Primary Cardiologist: Regan Lemming, MD  Chart reviewed as part of pre-operative protocol coverage. Given past medical history and time since last visit, based on ACC/AHA guidelines, Julie Clark is at acceptable risk for the planned procedure without further cardiovascular testing.   Patient with diagnosis of atrial fibrillation on Eliquis for anticoagulation.     What type of surgery is being performed?     colonoscopy  When is this surgery scheduled?     03/21/23      CHA2DS2-VASc Score = 3   This indicates a 3.2% annual risk of stroke. The patient's score is based upon: CHF History: 0 HTN History: 1 Diabetes History: 0 Stroke History: 0 Vascular Disease History: 0 Age Score: 1 Gender Score: 1     CrCl 100 Platelet count 418   Per office protocol, patient can hold Eliquis for 2 days prior to procedure.   Patient will not need bridging with Lovenox (enoxaparin) around procedure.  I will route this recommendation to the requesting party via Epic fax function and remove from pre-op pool.  Please call with questions.  Joni Reining, NP 01/02/2023, 3:12 PM

## 2023-01-02 NOTE — Telephone Encounter (Signed)
[facility] Pre-operative Risk Assessment     Request for surgical clearance:     Endoscopy Procedure  What type of surgery is being performed?     colonoscopy  When is this surgery scheduled?     03/21/23  What type of clearance is required ?   Pharmacy  Are there any medications that need to be held prior to surgery and how long? Eliquis x 2 days  Practice name and name of physician performing surgery?       Gastroenterology  What is your office phone and fax number?      Phone- [phone]  Fax- [phone]  Anesthesia type (None, local, MAC, general) ?       MAC

## 2023-01-04 NOTE — Telephone Encounter (Signed)
Patient notified she can hold her Eliquis 2 days prior to her procedure. Patient verbalized understanding.

## 2023-01-09 ENCOUNTER — Other Ambulatory Visit (HOSPITAL_COMMUNITY): Payer: Self-pay

## 2023-01-09 ENCOUNTER — Other Ambulatory Visit (INDEPENDENT_AMBULATORY_CARE_PROVIDER_SITE_OTHER): Payer: Commercial Managed Care - PPO

## 2023-01-09 DIAGNOSIS — K50119 Crohn's disease of large intestine with unspecified complications: Secondary | ICD-10-CM

## 2023-01-09 DIAGNOSIS — K50114 Crohn's disease of large intestine with abscess: Secondary | ICD-10-CM

## 2023-01-09 LAB — CBC WITH DIFFERENTIAL/PLATELET
Basophils Absolute: 0 10*3/uL (ref 0.0–0.1)
Basophils Relative: 0.5 % (ref 0.0–3.0)
Eosinophils Absolute: 0.1 10*3/uL (ref 0.0–0.7)
Eosinophils Relative: 1.8 % (ref 0.0–5.0)
HCT: 39.2 % (ref 36.0–46.0)
Hemoglobin: 12.8 g/dL (ref 12.0–15.0)
Lymphocytes Relative: 17.6 % (ref 12.0–46.0)
Lymphs Abs: 1.2 10*3/uL (ref 0.7–4.0)
MCHC: 32.6 g/dL (ref 30.0–36.0)
MCV: 93.2 fl (ref 78.0–100.0)
Monocytes Absolute: 0.5 10*3/uL (ref 0.1–1.0)
Monocytes Relative: 7.4 % (ref 3.0–12.0)
Neutro Abs: 4.9 10*3/uL (ref 1.4–7.7)
Neutrophils Relative %: 72.7 % (ref 43.0–77.0)
Platelets: 352 10*3/uL (ref 150.0–400.0)
RBC: 4.21 Mil/uL (ref 3.87–5.11)
RDW: 15.7 % — ABNORMAL HIGH (ref 11.5–15.5)
WBC: 6.8 10*3/uL (ref 4.0–10.5)

## 2023-01-09 LAB — COMPREHENSIVE METABOLIC PANEL
ALT: 30 U/L (ref 0–35)
AST: 28 U/L (ref 0–37)
Albumin: 3.9 g/dL (ref 3.5–5.2)
Alkaline Phosphatase: 138 U/L — ABNORMAL HIGH (ref 39–117)
BUN: 20 mg/dL (ref 6–23)
CO2: 29 mEq/L (ref 19–32)
Calcium: 8.9 mg/dL (ref 8.4–10.5)
Chloride: 100 mEq/L (ref 96–112)
Creatinine, Ser: 0.75 mg/dL (ref 0.40–1.20)
GFR: 82.73 mL/min (ref >60.00)
Glucose, Bld: 101 mg/dL — ABNORMAL HIGH (ref 70–99)
Potassium: 4 mEq/L (ref 3.5–5.1)
Sodium: 136 mEq/L (ref 135–145)
Total Bilirubin: 0.6 mg/dL (ref 0.2–1.2)
Total Protein: 7.3 g/dL (ref 6.0–8.3)

## 2023-01-09 LAB — C-REACTIVE PROTEIN: CRP: <1 mg/dL (ref 0.5–20.0)

## 2023-01-10 ENCOUNTER — Other Ambulatory Visit (HOSPITAL_COMMUNITY): Payer: Self-pay

## 2023-01-10 ENCOUNTER — Ambulatory Visit (INDEPENDENT_AMBULATORY_CARE_PROVIDER_SITE_OTHER): Payer: Commercial Managed Care - PPO | Admitting: Internal Medicine

## 2023-01-10 ENCOUNTER — Encounter: Payer: Self-pay | Admitting: Internal Medicine

## 2023-01-10 ENCOUNTER — Ambulatory Visit
Admission: RE | Admit: 2023-01-10 | Discharge: 2023-01-10 | Disposition: A | Discharge status: Home / Self Care | Payer: Commercial Managed Care - PPO | Source: Ambulatory Visit | Attending: Internal Medicine | Admitting: Internal Medicine

## 2023-01-10 VITALS — BP 160/82 | HR 66 | Temp 98.0°F | Ht 64.0 in | Wt 176.0 lb

## 2023-01-10 DIAGNOSIS — Z1231 Encounter for screening mammogram for malignant neoplasm of breast: Secondary | ICD-10-CM

## 2023-01-10 DIAGNOSIS — Z0001 Encounter for general adult medical examination with abnormal findings: Secondary | ICD-10-CM | POA: Diagnosis not present

## 2023-01-10 DIAGNOSIS — E519 Thiamine deficiency, unspecified: Secondary | ICD-10-CM | POA: Diagnosis not present

## 2023-01-10 DIAGNOSIS — I10 Essential (primary) hypertension: Secondary | ICD-10-CM

## 2023-01-10 DIAGNOSIS — I480 Paroxysmal atrial fibrillation: Secondary | ICD-10-CM | POA: Diagnosis not present

## 2023-01-10 DIAGNOSIS — E538 Deficiency of other specified B group vitamins: Secondary | ICD-10-CM | POA: Diagnosis not present

## 2023-01-10 DIAGNOSIS — Z23 Encounter for immunization: Secondary | ICD-10-CM

## 2023-01-10 DIAGNOSIS — D500 Iron deficiency anemia secondary to blood loss (chronic): Secondary | ICD-10-CM | POA: Diagnosis not present

## 2023-01-10 DIAGNOSIS — E785 Hyperlipidemia, unspecified: Secondary | ICD-10-CM

## 2023-01-10 DIAGNOSIS — D510 Vitamin B12 deficiency anemia due to intrinsic factor deficiency: Secondary | ICD-10-CM

## 2023-01-10 LAB — URINALYSIS, ROUTINE W REFLEX MICROSCOPIC
Bilirubin Urine: NEGATIVE
Ketones, ur: NEGATIVE
Leukocytes,Ua: NEGATIVE
Nitrite: NEGATIVE
Specific Gravity, Urine: <=1.005 — AB (ref 1.000–1.030)
Total Protein, Urine: NEGATIVE
Urine Glucose: NEGATIVE
Urobilinogen, UA: 0.2 (ref 0.0–1.0)
pH: 6.5 (ref 5.0–8.0)

## 2023-01-10 LAB — VITAMIN B12: Vitamin B-12: 238 pg/mL (ref 211–911)

## 2023-01-10 LAB — IBC + FERRITIN
Ferritin: 62.8 ng/mL (ref 10.0–291.0)
Iron: 107 ug/dL (ref 42–145)
Saturation Ratios: 38 % (ref 20.0–50.0)
TIBC: 281.4 ug/dL (ref 250.0–450.0)
Transferrin: 201 mg/dL — ABNORMAL LOW (ref 212.0–360.0)

## 2023-01-10 LAB — TSH: TSH: 0.41 u[IU]/mL (ref 0.35–5.50)

## 2023-01-10 LAB — LIPID PANEL
Cholesterol: 117 mg/dL (ref 0–200)
HDL: 47.9 mg/dL (ref >39.00)
LDL Cholesterol: 61 mg/dL (ref 0–99)
NonHDL: 68.85
Total CHOL/HDL Ratio: 2
Triglycerides: 40 mg/dL (ref 0.0–149.0)
VLDL: 8 mg/dL (ref 0.0–40.0)

## 2023-01-10 LAB — FOLATE: Folate: 12.2 ng/mL (ref >5.9)

## 2023-01-10 MED ORDER — ROSUVASTATIN CALCIUM 5 MG PO TABS
5.0000 mg | ORAL_TABLET | Freq: Every day | ORAL | 1 refills | Status: DC
Start: 2023-01-10 — End: 2023-04-23
  Filled 2023-01-10: qty 90, 90d supply, fill #0

## 2023-01-10 MED ORDER — INDAPAMIDE 1.25 MG PO TABS
1.2500 mg | ORAL_TABLET | Freq: Every day | ORAL | 0 refills | Status: DC
Start: 2023-01-10 — End: 2023-04-04
  Filled 2023-01-10: qty 90, 90d supply, fill #0

## 2023-01-10 NOTE — Patient Instructions (Signed)

## 2023-01-10 NOTE — Progress Notes (Unsigned)
Subjective:  Patient ID: Julie Clark, female    DOB: [birth year]  Age: 67 y.o. MRN: [id number]  CC: Annual Exam, Hyperlipidemia, Gastroesophageal Reflux, and Atrial Fibrillation   HPI DEJAI RUEBUSH presents for a CPX and f/up ---   Discussed the use of AI scribe software for clinical note transcription with the patient, who gave verbal consent to proceed.  History of Present Illness   The patient, with a history of atrial fibrillation and Crohn's disease, reports no recent episodes of AFib, with only one episode per year. They deny any headache, chest pain, shortness of breath, or vision problems. They also deny any symptoms related to their bowel disease, such as abdominal pain, nausea, vomiting, diarrhea, or blood in the stool.  However, they recently developed an area of concern on their backside, for which they sought medical attention from GI. They were prescribed antibiotics and are considering an MRI to rule out the formation of a fistula. They are due for a colonoscopy in October.  Their current medication regimen includes Levaquin, with two doses remaining, Metoprolol, Eliquis, Cardizem 120 with a PRN for AFib episodes, and daily Imuran for Crohn's disease. They were advised to take folic acid due to low levels in their recent lab results, but have not yet started this medication.  The patient is currently working and maintains an active lifestyle, with no reported chest pain, shortness of breath, dizziness, or lightheadedness. Their last mammogram was a year ago, and they are due for a tetanus booster, having not received one in over ten years.       Outpatient Medications Prior to Visit  Medication Sig Dispense Refill   acetaminophen (TYLENOL) 500 MG tablet Take 500 mg by mouth every 6 (six) hours as needed.     apixaban (ELIQUIS) 5 MG TABS tablet Take 1 tablet (5 mg total) by mouth 2 (two) times daily. 180 tablet 2   azaTHIOprine (IMURAN) 50 MG tablet Take 3 tablets (150 mg  total) by mouth daily with lunch. 270 tablet 2   diltiazem (CARDIZEM CD) 120 MG 24 hr capsule Take 1 capsule (120 mg total) by mouth daily. 90 capsule 2   diltiazem (CARDIZEM) 30 MG tablet Take 1 tablet every 4 hours AS NEEDED for AFIB heart rate >100 as long as top BP >100. 30 tablet 1   folic acid (FOLVITE) 1 MG tablet Take 1 tablet (1 mg total) by mouth daily. 90 tablet 1   inFLIXimab (REMICADE IV) Inject into the vein. Every 6 weeks     levofloxacin (LEVAQUIN) 500 MG tablet Take 1 tablet (500 mg total) by mouth daily for 10 days. Patient will pick up today, thanks 10 tablet 0   metoprolol succinate (TOPROL-XL) 25 MG 24 hr tablet Take 1 tablet (25 mg total) by mouth 2 (two) times daily. 180 tablet 0   Polyethylene Glycol 3350 (MIRALAX PO) Take 1 Package by mouth daily. Mix powder with liquid and take once daily     Chlorhexidine Gluconate Cloth 2 % PADS 6 each      Chlorhexidine Gluconate Cloth 2 % PADS 6 each      No facility-administered medications prior to visit.    ROS Review of Systems  Objective:  BP (!) 160/82 (BP Location: Left Arm, Patient Position: Sitting, Cuff Size: Large)   Pulse 66   Temp 98 F (36.7 C) (Oral)   Ht 5\' 4"  (1.626 m)   Wt 176 lb (79.8 kg)   SpO2 94%  BMI 30.21 kg/m   BP Readings from Last 3 Encounters:  01/10/23 (!) 160/82  01/02/23 (!) 166/70  12/04/22 (!) 157/72    Wt Readings from Last 3 Encounters:  01/10/23 176 lb (79.8 kg)  01/02/23 179 lb (81.2 kg)  12/04/22 182 lb 6.4 oz (82.7 kg)    Physical Exam  Lab Results  Component Value Date   WBC 6.8 01/09/2023   HGB 12.8 01/09/2023   HCT 39.2 01/09/2023   PLT 352.0 01/09/2023   GLUCOSE 101 (H) 01/09/2023   CHOL 117 01/10/2023   TRIG 40.0 01/10/2023   HDL 47.90 01/10/2023   LDLCALC 61 01/10/2023   ALT 30 01/09/2023   AST 28 01/09/2023   NA 136 01/09/2023   K 4.0 01/09/2023   CL 100 01/09/2023   CREATININE 0.75 01/09/2023   BUN 20 01/09/2023   CO2 29 01/09/2023   TSH 0.41  01/10/2023    MR HIP RIGHT W CONTRAST  Result Date: 07/22/2022 CLINICAL DATA:  Right hip pain, clicking and popping. EXAM: MRI OF THE RIGHT HIP WITH CONTRAST (MR Arthrogram) TECHNIQUE: Multiplanar, multisequence MR imaging of the hip was performed immediately following contrast injection into the hip joint under fluoroscopic guidance. No intravenous contrast was administered. COMPARISON:  Radiographs 08/30/2021 FINDINGS: Both hips are normally located. Moderate bilateral hip joint degenerative changes with degenerative chondrosis, joint space narrowing, subchondral cystic change and osteophytic spurring. These changes are slightly greater on the right than on the left. No stress fracture or AVN. The superior and anterior-superior labrum is degenerated and torn. The hip and pelvic musculature is unremarkable. No muscle tear, myositis or mass. No peritendinitis or trochanteric bursitis. The hamstring tendons are intact. The pubic symphysis and SI joints are intact. Mild degenerative changes. No sacral stress or insufficiency fracture. No pelvic bone lesions. Fibroid uterus noted. No adnexal mass. No free pelvic fluid collections or inguinal hernia. IMPRESSION: 1. Moderate bilateral hip joint degenerative changes slightly greater on the right than on the left. No stress fracture or AVN. 2. Degenerated and torn superior and anterior-superior labrum. 3. Intact hip and pelvic musculature. Electronically Signed   By: Rudie Meyer M.D.   On: 07/22/2022 10:21   DG FLUORO GUIDED NEEDLE PLC ASPIRATION/INJECTION LOC  Result Date: 07/19/2022 CLINICAL DATA:  Right hip pain.  Osteoarthritis. EXAM: RIGHT HIP INJECTION FOR MRI FLUOROSCOPY: Radiation Exposure Index (as provided by the fluoroscopic device): 1.50 mGy Kerma PROCEDURE: The overlying skin was prepped with Betadine, draped in the usual sterile fashion, and infiltrated locally with 1% lidocaine. A 3.5 inch 22 gauge spinal needle was advanced to the lateral aspect  of the right femoral head-neck junction. 1 mL of 1% lidocaine injected easily. A mixture of 0.1 mL of MultiHance, 15 mL of Isovue-M 200, and 5 mL of sterile saline was then used to opacify the right hip joint. 12 mL of this mixture were injected. The needle was removed and a dressing was applied. There was no immediate complication. IMPRESSION: Technically successful right hip injection under fluoroscopy for MR arthrogram. Electronically Signed   By: Sebastian Ache M.D.   On: 07/19/2022 12:10    Assessment & Plan:  Hyperlipidemia with target LDL less than 160 -     Lipid panel; Future -     TSH; Future -     Lipoprotein A (LPA); Future -     Rosuvastatin Calcium; Take 1 tablet (5 mg total) by mouth daily.  Dispense: 90 tablet; Refill: 1  Paroxysmal atrial fibrillation (HCC) -  TSH; Future  Benign systolic hypertension -     Urinalysis, Routine w reflex microscopic; Future -     Aldosterone + renin activity w/ ratio; Future -     Indapamide; Take 1 tablet (1.25 mg total) by mouth daily.  Dispense: 90 tablet; Refill: 0  Folate deficiency  Iron deficiency anemia due to chronic blood loss -     IBC + Ferritin; Future  Thiamine deficiency -     Vitamin B1; Future  Vitamin B12 deficiency anemia due to intrinsic factor deficiency -     Folate; Future -     Vitamin B12; Future  Encounter for general adult medical examination with abnormal findings  Other orders -     Tdap vaccine greater than or equal to 7yo IM     Follow-up: Return in about 3 months (around 04/12/2023).  Sanda Linger, MD

## 2023-01-11 ENCOUNTER — Other Ambulatory Visit: Payer: Self-pay

## 2023-01-14 ENCOUNTER — Other Ambulatory Visit (HOSPITAL_COMMUNITY): Payer: Self-pay

## 2023-01-14 ENCOUNTER — Telehealth: Payer: Self-pay | Admitting: Internal Medicine

## 2023-01-14 LAB — THIOPURINE METABOLITES
6 MMP(6-Methylmercaptopurine): 12988 pmol/8x10(8)RBC — ABNORMAL HIGH (ref <5700)
6 TG(6-Thioguanine): 178 pmol/8x10(8)RBC — ABNORMAL LOW (ref 235–400)

## 2023-01-14 LAB — VITAMIN B1: Vitamin B1 (Thiamine): 8 nmol/L (ref 8–30)

## 2023-01-14 NOTE — Telephone Encounter (Signed)
Radiology dept called to ask for a new MRI order to reflect Abd\Pelvic with and withoout contrast. Thanks

## 2023-01-15 ENCOUNTER — Ambulatory Visit (INDEPENDENT_AMBULATORY_CARE_PROVIDER_SITE_OTHER): Payer: Commercial Managed Care - PPO

## 2023-01-15 ENCOUNTER — Other Ambulatory Visit: Payer: Self-pay

## 2023-01-15 VITALS — BP 148/74 | HR 56 | Temp 98.0°F | Resp 20 | Ht 64.0 in | Wt 175.8 lb

## 2023-01-15 DIAGNOSIS — K50114 Crohn's disease of large intestine with abscess: Secondary | ICD-10-CM

## 2023-01-15 DIAGNOSIS — K50119 Crohn's disease of large intestine with unspecified complications: Secondary | ICD-10-CM

## 2023-01-15 MED ORDER — DIPHENHYDRAMINE HCL 25 MG PO CAPS: 25.0000 mg | ORAL_CAPSULE | Freq: Once | ORAL | Status: DC

## 2023-01-15 MED ORDER — METHYLPREDNISOLONE SODIUM SUCC 40 MG IJ SOLR: 40.0000 mg | Freq: Once | INTRAMUSCULAR | Status: DC

## 2023-01-15 MED ORDER — SODIUM CHLORIDE 0.9 % IV SOLN
10.0000 mg/kg | Freq: Once | INTRAVENOUS | Status: AC
  Administered 2023-01-15: 800 mg via INTRAVENOUS
  Filled 2023-01-15: qty 80

## 2023-01-15 MED ORDER — ACETAMINOPHEN 325 MG PO TABS: 650.0000 mg | ORAL_TABLET | Freq: Once | ORAL | Status: DC

## 2023-01-15 NOTE — Telephone Encounter (Signed)
Julie Clark from [facility] states she needs the order for MRI of the pelvis to be put in for w/ and w/o contrast. Informed Julie Clark I will change the order in Epic. Julie Clark verbalized understanding.

## 2023-01-15 NOTE — Progress Notes (Signed)
Diagnosis: Crohn's Disease  Provider:  Chilton Greathouse MD  Procedure: IV Infusion  IV Type: Peripheral, IV Location: L Antecubital  Avsola (infliximab-axxq), Dose: 800 mg  Infusion Start Time: 0939  Infusion Stop Time: 1151  Post Infusion IV Care: Patient declined observation and Peripheral IV Discontinued  Discharge: Condition: Good, Destination: Home . AVS Declined  Performed by:  Wyvonne Lenz, RN

## 2023-01-16 LAB — LIPOPROTEIN A (LPA): Lipoprotein (a): <10 nmol/L (ref <75)

## 2023-01-17 LAB — ALDOSTERONE + RENIN ACTIVITY W/ RATIO
ALDO / PRA Ratio: 47.1 ratio — ABNORMAL HIGH (ref 0.9–28.9)
Aldosterone: 16 ng/dL
Renin Activity: 0.34 ng/mL/h (ref 0.25–5.82)

## 2023-01-17 LAB — SERIAL MONITORING

## 2023-01-18 LAB — INFLIXIMAB+AB (SERIAL MONITOR)
Anti-Infliximab Antibody: <22 ng/mL
Infliximab Drug Level: 20 ug/mL

## 2023-01-22 ENCOUNTER — Telehealth: Payer: Self-pay

## 2023-01-22 ENCOUNTER — Other Ambulatory Visit (HOSPITAL_COMMUNITY): Payer: Self-pay

## 2023-01-22 ENCOUNTER — Encounter: Payer: Self-pay | Admitting: Internal Medicine

## 2023-01-22 MED ORDER — LEVOFLOXACIN 500 MG PO TABS
500.0000 mg | ORAL_TABLET | Freq: Every day | ORAL | 0 refills | Status: DC
  Filled 2023-01-22 (×2): qty 10, 10d supply, fill #0

## 2023-01-22 NOTE — Telephone Encounter (Signed)
-----   Message from Carie Caddy Pyrtle sent at 01/22/2023  3:35 PM EDT ----- I have discussed results with the patient in the office She has recurrent concern for fistulous drainage now anteriorly near the left labia Will come for colonoscopy sooner than scheduled Colonoscopy now 01/29/2023  Please call in another 10 days of levofloxacin 500 mg daily [facility]

## 2023-01-22 NOTE — Telephone Encounter (Signed)
Prescription sent to pharmacy as instructed

## 2023-01-24 ENCOUNTER — Other Ambulatory Visit (HOSPITAL_COMMUNITY): Payer: Self-pay

## 2023-01-26 ENCOUNTER — Encounter: Payer: Self-pay | Admitting: Certified Registered Nurse Anesthetist

## 2023-01-29 ENCOUNTER — Encounter: Payer: Self-pay | Admitting: Internal Medicine

## 2023-01-29 ENCOUNTER — Ambulatory Visit (AMBULATORY_SURGERY_CENTER): Payer: Commercial Managed Care - PPO | Admitting: Internal Medicine

## 2023-01-29 VITALS — BP 94/56 | HR 72 | Temp 99.1°F | Resp 18 | Ht 64.0 in | Wt 179.0 lb

## 2023-01-29 DIAGNOSIS — I10 Essential (primary) hypertension: Secondary | ICD-10-CM | POA: Diagnosis not present

## 2023-01-29 DIAGNOSIS — K50119 Crohn's disease of large intestine with unspecified complications: Secondary | ICD-10-CM | POA: Diagnosis not present

## 2023-01-29 MED ORDER — SODIUM CHLORIDE 0.9 % IV SOLN: 500.0000 mL | Freq: Once | INTRAVENOUS | Status: AC

## 2023-01-29 NOTE — Progress Notes (Signed)
See office visit dated 01/02/2023  Patient presenting for colonoscopy to evaluate colonic Crohn's disease with perianal involvement and perianal abscesses  She has been off Eliquis x 2 days  Subsequent to her office visit we now know her infliximab levels to be adequate at trough without antibodies MRI not yet performed  She remains appropriate for [facility] colonoscopy today

## 2023-01-29 NOTE — Progress Notes (Signed)
Report given to PACU, vss 

## 2023-01-29 NOTE — Op Note (Addendum)
[facility] Patient Name: Julie Clark Procedure Date: 01/29/2023 3:17 PM MRN: [id number] Endoscopist: Beverley Fiedler , MD, [id number] Age: 67 Referring MD:  Date of Birth: 1956-03-27 Gender: Female Account #: [id number] Procedure:                Colonoscopy Indications:              Last colonoscopy: December 2021, Crohn's disease of                            the colon, Disease activity assessment of Crohn's                            disease of the colon; recent recurrent perineal                            abscess for exclusion of recurrent fistulae,                            current therapy is infliximab 10 mg/kg, aza 150 mg Medicines:                Monitored Anesthesia Care Procedure:                Pre-Anesthesia Assessment:                           - Prior to the procedure, a History and Physical                            was performed, and patient medications and                            allergies were reviewed. The patient's tolerance of                            previous anesthesia was also reviewed. The risks                            and benefits of the procedure and the sedation                            options and risks were discussed with the patient.                            All questions were answered, and informed consent                            was obtained. Prior Anticoagulants: The patient has                            taken Eliquis (apixaban), last dose was 2 days                            prior to procedure. ASA Grade Assessment: II - A  patient with mild systemic disease. After reviewing                            the risks and benefits, the patient was deemed in                            satisfactory condition to undergo the procedure.                           After obtaining informed consent, the colonoscope                            was passed under direct vision. Throughout the                             procedure, the patient's blood pressure, pulse, and                            oxygen saturations were monitored continuously. The                            Olympus Scope [id number] was introduced through the                            anus and advanced to the terminal ileum. The                            colonoscopy was performed without difficulty. The                            patient tolerated the procedure well. The quality                            of the bowel preparation was good. The terminal                            ileum, ileocecal valve, appendiceal orifice, and                            rectum were photographed. Scope In: 3:29:40 PM Scope Out: 3:47:47 PM Scope Withdrawal Time: 0 hours 13 minutes 34 seconds  Total Procedure Duration: 0 hours 18 minutes 7 seconds  Findings:                 The digital rectal exam was normal. The previously                            seen abscess on the right buttock has healed.                           The terminal ileum appeared normal.                           A benign-appearing, intrinsic mild stenosis  measuring 1 cm (in length) x 1.2 cm (inner                            diameter) was found in the ascending colon and was                            traversed.                           The Simple Endoscopic Score for Crohn's Disease was                            determined based on the endoscopic appearance of                            the mucosa in the following segments:                           - Ileum: Findings include no ulcers present, no                            ulcerated surfaces, no affected surfaces and no                            narrowings. Segment score: 0.                           - Right Colon: Findings include no ulcers present,                            no ulcerated surfaces, no affected surfaces and a                            single narrowing that can be passed. Segment score:                             1.                           - Transverse Colon: Findings include no ulcers                            present, no ulcerated surfaces, no affected                            surfaces and no narrowings. Segment score: 0.                           - Left Colon: Findings include no ulcers present,                            no ulcerated surfaces, no affected surfaces and no  narrowings. Segment score: 0.                           - Rectum: Findings include no ulcers present, no                            ulcerated surfaces, no affected surfaces and no                            narrowings. Segment score: 0.                           - Total SES-CD aggregate score: 1.                           The retroflexed view of the distal rectum and anal                            verge was normal and showed no anal or rectal                            abnormalities. Complications:            No immediate complications. Estimated Blood Loss:     Estimated blood loss: none. Impression:               - Perianal/perineal abscess has healed with                            levofloxacin.                           - The examined portion of the ileum was normal.                           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                            Benign-appearing and not obstructive.                           - Simple Endoscopic Score for Crohn's Disease: 1                            (stricture in right colon), no mucosal inflammatory                            changes secondary to Crohn's disease, in remission.                            Excellent mucosal control of Crohn's disease on                            current therapy.                           - The  distal rectum and anal verge are normal on                            retroflexion view.                           - No specimens collected. Recommendation:           - Patient has a contact number  available for                            emergencies. The signs and symptoms of potential                            delayed complications were discussed with the                            patient. Return to normal activities tomorrow.                            Written discharge instructions were provided to the                            patient.                           - Resume previous diet.                           - Continue present medications.                           - Reduce azathioprine to 100 mg daily given                            metabolite information.                           - Continue infliximab infusions at current dose for                            now.                           - If recurrent perianal or perineal abscess then                            MRI pelvis (but can hold off on this for now).                           - Repeat colonoscopy in 3 years for surveillance.                           - Resume Eliquis (apixaban) at prior dose today.  Refer to managing physician for further adjustment                            of therapy. Beverley Fiedler, MD 01/29/2023 3:58:12 PM This report has been signed electronically.

## 2023-01-29 NOTE — Patient Instructions (Addendum)
Decrease the azathioprine to 100 mg daily.  Restart your Eliquis today.  Resume your other medications as ordered.  Repeat your colonoscopy in 3 years.  YOU HAD AN ENDOSCOPIC PROCEDURE TODAY AT THE [facility]:   Refer to the procedure report that was given to you for any specific questions about what was found during the examination.  If the procedure report does not answer your questions, please call your gastroenterologist to clarify.  If you requested that your care partner not be given the details of your procedure findings, then the procedure report has been included in a sealed envelope for you to review at your convenience later.  YOU SHOULD EXPECT: Some feelings of bloating in the abdomen. Passage of more gas than usual.  Walking can help get rid of the air that was put into your GI tract during the procedure and reduce the bloating. If you had a lower endoscopy (such as a colonoscopy or flexible sigmoidoscopy) you may notice spotting of blood in your stool or on the toilet paper. If you underwent a bowel prep for your procedure, you may not have a normal bowel movement for a few days.  Please Note:  You might notice some irritation and congestion in your nose or some drainage.  This is from the oxygen used during your procedure.  There is no need for concern and it should clear up in a day or so.  SYMPTOMS TO REPORT IMMEDIATELY:  Following lower endoscopy (colonoscopy or flexible sigmoidoscopy):  Excessive amounts of blood in the stool  Significant tenderness or worsening of abdominal pains  Swelling of the abdomen that is new, acute  Fever of 100F or higher  For urgent or emergent issues, a gastroenterologist can be reached at any hour by calling [phone]. Do not use MyChart messaging for urgent concerns.    DIET:  We do recommend a small meal at first, but then you may proceed to your regular diet.  Drink plenty of fluids but you should avoid alcoholic beverages  for 24 hours.  ACTIVITY:  You should plan to take it easy for the rest of today and you should NOT DRIVE or use heavy machinery until tomorrow (because of the sedation medicines used during the test).    FOLLOW UP: Our staff will call the number listed on your records the next business day following your procedure.  We will call around 7:15- 8:00 am to check on you and address any questions or concerns that you may have regarding the information given to you following your procedure. If we do not reach you, we will leave a message.     If any biopsies were taken you will be contacted by phone or by letter within the next 1-3 weeks   SIGNATURES/CONFIDENTIALITY: You and/or your care partner have signed paperwork which will be entered into your electronic medical record.  These signatures attest to the fact that that the information above on your After Visit Summary has been reviewed and is understood.  Full responsibility of the confidentiality of this discharge information lies with you and/or your care-partner.

## 2023-01-30 ENCOUNTER — Telehealth: Payer: Self-pay

## 2023-01-30 ENCOUNTER — Other Ambulatory Visit: Payer: Self-pay

## 2023-01-30 MED ORDER — AZATHIOPRINE 50 MG PO TABS: 100.0000 mg | ORAL_TABLET | Freq: Every day | ORAL | Status: AC

## 2023-01-30 NOTE — Telephone Encounter (Signed)
  Follow up Call-     01/29/2023    2:18 PM  Call back number  Post procedure Call Back phone  # she's working today  Permission to leave phone message Yes     Patient questions:  Do you have a fever, pain , or abdominal swelling? No. Pain Score  0 *  Have you tolerated food without any problems? Yes.    Have you been able to return to your normal activities? Yes.    Do you have any questions about your discharge instructions: Diet   No. Medications  No. Follow up visit  No.  Do you have questions or concerns about your Care? No.  Actions: * If pain score is 4 or above: No action needed, pain <4.

## 2023-02-26 ENCOUNTER — Ambulatory Visit: Payer: Commercial Managed Care - PPO | Admitting: *Deleted

## 2023-02-26 VITALS — BP 143/76 | HR 60 | Temp 98.2°F | Resp 16 | Ht 64.0 in | Wt 175.0 lb

## 2023-02-26 DIAGNOSIS — K50114 Crohn's disease of large intestine with abscess: Secondary | ICD-10-CM

## 2023-02-26 DIAGNOSIS — K50119 Crohn's disease of large intestine with unspecified complications: Secondary | ICD-10-CM

## 2023-02-26 MED ORDER — DIPHENHYDRAMINE HCL 25 MG PO CAPS: 25.0000 mg | ORAL_CAPSULE | Freq: Once | ORAL | Status: DC

## 2023-02-26 MED ORDER — ACETAMINOPHEN 325 MG PO TABS: 650.0000 mg | ORAL_TABLET | Freq: Once | ORAL | Status: DC

## 2023-02-26 MED ORDER — SODIUM CHLORIDE 0.9 % IV SOLN
10.0000 mg/kg | Freq: Once | INTRAVENOUS | Status: AC
  Administered 2023-02-26: 800 mg via INTRAVENOUS
  Filled 2023-02-26: qty 80

## 2023-02-26 MED ORDER — METHYLPREDNISOLONE SODIUM SUCC 40 MG IJ SOLR: 40.0000 mg | Freq: Once | INTRAMUSCULAR | Status: DC

## 2023-02-26 NOTE — Progress Notes (Signed)
Diagnosis: Crohn's Disease  Provider:  Chilton Greathouse MD  Procedure: IV Infusion  IV Type: Peripheral, IV Location: R Antecubital  Avsola (infliximab-axxq), Dose: 800 mg  Infusion Start Time: 0916 am  Infusion Stop Time: 1143 am  Post Infusion IV Care: Observation period completed and Peripheral IV Discontinued  Discharge: Condition: Good, Destination: Home . AVS Declined  Performed by:  Forrest Moron, RN

## 2023-03-05 ENCOUNTER — Other Ambulatory Visit (HOSPITAL_COMMUNITY): Payer: Self-pay

## 2023-03-05 ENCOUNTER — Telehealth: Payer: Self-pay | Admitting: Internal Medicine

## 2023-03-05 ENCOUNTER — Other Ambulatory Visit: Payer: Self-pay

## 2023-03-05 ENCOUNTER — Encounter: Payer: Self-pay | Admitting: Pharmacist

## 2023-03-05 DIAGNOSIS — K50114 Crohn's disease of large intestine with abscess: Secondary | ICD-10-CM

## 2023-03-05 MED ORDER — LEVOFLOXACIN 500 MG PO TABS
500.0000 mg | ORAL_TABLET | Freq: Every day | ORAL | 0 refills | Status: DC
  Filled 2023-03-05: qty 10, 10d supply, fill #0

## 2023-03-05 NOTE — Telephone Encounter (Signed)
Patient reports intermittent nodules in the perianal/perineal area which drain and resolve Most recently with an area near vagina  She will see her GYN soon Recent colonoscopy  Please proceed with MRI pelvis with contrast; Crohn's colitis with perianal involvement, evaluate for fistula

## 2023-03-05 NOTE — Telephone Encounter (Signed)
Placed new order in Epic for MRI pelvis. Called patient with no answer and no voicemail to leave a message.

## 2023-03-06 NOTE — Telephone Encounter (Signed)
Patient is aware that I put a new order in Epic for MRI pelvis and she needs to contact [facility] to schedule.

## 2023-03-06 NOTE — Telephone Encounter (Signed)
Patient states her MRI is scheduled for the soonest they have available on 04/18/23 at 10:00am. Informed patient that this may to be far out. Dr. Rhea Belton, do you want me to put the order in STAT to get the MRI scheduled sooner?

## 2023-03-06 NOTE — Addendum Note (Signed)
Addended by: Jovita Kussmaul L on: 03/06/2023 10:45 AM   Modules accepted: Orders

## 2023-03-07 NOTE — Telephone Encounter (Signed)
Patient is aware to contact us if she has any changes such as new skin lesions or boils and we can schedule her MRI sooner. Patient agreed and verbalized understanding.

## 2023-03-12 ENCOUNTER — Other Ambulatory Visit (HOSPITAL_COMMUNITY): Payer: Self-pay

## 2023-03-12 ENCOUNTER — Other Ambulatory Visit: Payer: Self-pay | Admitting: Internal Medicine

## 2023-03-12 ENCOUNTER — Other Ambulatory Visit: Payer: Self-pay

## 2023-03-12 DIAGNOSIS — I480 Paroxysmal atrial fibrillation: Secondary | ICD-10-CM

## 2023-03-12 DIAGNOSIS — I10 Essential (primary) hypertension: Secondary | ICD-10-CM

## 2023-03-12 MED ORDER — METOPROLOL SUCCINATE ER 25 MG PO TB24
25.0000 mg | ORAL_TABLET | Freq: Two times a day (BID) | ORAL | 0 refills | Status: DC
Start: 2023-03-12 — End: 2023-04-23
  Filled 2023-03-12: qty 180, 90d supply, fill #0

## 2023-03-13 ENCOUNTER — Encounter: Payer: Self-pay | Admitting: Gastroenterology

## 2023-03-14 DIAGNOSIS — Z1151 Encounter for screening for human papillomavirus (HPV): Secondary | ICD-10-CM | POA: Diagnosis not present

## 2023-03-14 DIAGNOSIS — Z6828 Body mass index (BMI) 28.0-28.9, adult: Secondary | ICD-10-CM | POA: Diagnosis not present

## 2023-03-14 DIAGNOSIS — R319 Hematuria, unspecified: Secondary | ICD-10-CM | POA: Diagnosis not present

## 2023-03-14 DIAGNOSIS — Z124 Encounter for screening for malignant neoplasm of cervix: Secondary | ICD-10-CM | POA: Diagnosis not present

## 2023-03-14 DIAGNOSIS — Z01419 Encounter for gynecological examination (general) (routine) without abnormal findings: Secondary | ICD-10-CM | POA: Diagnosis not present

## 2023-03-19 ENCOUNTER — Other Ambulatory Visit (HOSPITAL_COMMUNITY): Payer: Self-pay

## 2023-03-21 ENCOUNTER — Encounter: Payer: Commercial Managed Care - PPO | Admitting: Internal Medicine

## 2023-03-25 ENCOUNTER — Other Ambulatory Visit (HOSPITAL_COMMUNITY): Payer: Self-pay

## 2023-04-04 ENCOUNTER — Other Ambulatory Visit (HOSPITAL_COMMUNITY): Payer: Self-pay

## 2023-04-04 ENCOUNTER — Other Ambulatory Visit: Payer: Self-pay | Admitting: Internal Medicine

## 2023-04-04 ENCOUNTER — Telehealth: Payer: Self-pay | Admitting: Internal Medicine

## 2023-04-04 DIAGNOSIS — I10 Essential (primary) hypertension: Secondary | ICD-10-CM

## 2023-04-04 MED ORDER — INDAPAMIDE 1.25 MG PO TABS
1.2500 mg | ORAL_TABLET | Freq: Every day | ORAL | 0 refills | Status: DC
Start: 2023-04-04 — End: 2023-04-23
  Filled 2023-04-04: qty 90, 90d supply, fill #0

## 2023-04-04 NOTE — Telephone Encounter (Signed)
Patient called and said she is due for a refill of indapamide (LOZOL) 1.25 MG tablet. She said she was told by Dr. Yetta Barre that he wanted to check her blood pressure before refilling the medication. Dr. Yetta Barre' next opening is not until December and patient would like to know what to do. She said she feels like she is doing well on the medication and does not want to go without. Patient would like a call back at [phone].

## 2023-04-04 NOTE — Telephone Encounter (Signed)
Does she need an appointment or does she need just a BP check. If just a BP check I will get her scheduled for a nurse visit check her bp and let her leave.

## 2023-04-09 ENCOUNTER — Ambulatory Visit: Payer: Commercial Managed Care - PPO

## 2023-04-09 ENCOUNTER — Other Ambulatory Visit: Payer: Self-pay | Admitting: Internal Medicine

## 2023-04-09 VITALS — BP 154/75 | HR 60 | Temp 98.2°F | Resp 18 | Ht 64.0 in | Wt 179.6 lb

## 2023-04-09 DIAGNOSIS — K50114 Crohn's disease of large intestine with abscess: Secondary | ICD-10-CM | POA: Diagnosis not present

## 2023-04-09 DIAGNOSIS — K50119 Crohn's disease of large intestine with unspecified complications: Secondary | ICD-10-CM

## 2023-04-09 DIAGNOSIS — Z79899 Other long term (current) drug therapy: Secondary | ICD-10-CM

## 2023-04-09 DIAGNOSIS — I10 Essential (primary) hypertension: Secondary | ICD-10-CM

## 2023-04-09 MED ORDER — METHYLPREDNISOLONE SODIUM SUCC 40 MG IJ SOLR: 40.0000 mg | Freq: Once | INTRAMUSCULAR | Status: DC

## 2023-04-09 MED ORDER — SODIUM CHLORIDE 0.9 % IV SOLN
10.0000 mg/kg | Freq: Once | INTRAVENOUS | Status: AC
  Administered 2023-04-09: 800 mg via INTRAVENOUS
  Filled 2023-04-09: qty 80

## 2023-04-09 MED ORDER — DIPHENHYDRAMINE HCL 25 MG PO CAPS
25.0000 mg | ORAL_CAPSULE | Freq: Once | ORAL | Status: DC
Start: 2023-04-09 — End: 2023-04-09

## 2023-04-09 MED ORDER — ACETAMINOPHEN 325 MG PO TABS
650.0000 mg | ORAL_TABLET | Freq: Once | ORAL | Status: DC
Start: 2023-04-09 — End: 2023-04-09

## 2023-04-09 NOTE — Progress Notes (Signed)
Diagnosis: Crohn's Disease  Provider:  Chilton Greathouse MD  Procedure: IV Infusion  IV Type: Peripheral, IV Location: L Antecubital  Avsola (infliximab-axxq), Dose: 800mg   Infusion Start Time: 0906  Infusion Stop Time: 1120  Post Infusion IV Care: Peripheral IV Discontinued  Discharge: Condition: Good, Destination: Home . AVS Declined  Performed by:  Nat Math, RN

## 2023-04-10 ENCOUNTER — Other Ambulatory Visit: Payer: Commercial Managed Care - PPO

## 2023-04-10 DIAGNOSIS — K50119 Crohn's disease of large intestine with unspecified complications: Secondary | ICD-10-CM

## 2023-04-10 DIAGNOSIS — Z79899 Other long term (current) drug therapy: Secondary | ICD-10-CM | POA: Diagnosis not present

## 2023-04-12 ENCOUNTER — Telehealth: Payer: Self-pay | Admitting: Internal Medicine

## 2023-04-12 ENCOUNTER — Telehealth: Payer: Self-pay

## 2023-04-12 NOTE — Progress Notes (Signed)
   Care Guide Note  04/12/2023 Name: Julie Clark MRN: [id number] DOB: 11/30/1955  Referred by: Etta Grandchild, MD Reason for referral : Care Coordination (Outreach to schedule with Pharmd )   Julie Clark is a 67 y.o. year old female who is a primary care patient of Etta Grandchild, MD. Julie Clark was referred to the pharmacist for assistance related to HTN.    Successful contact was made with the patient to discuss pharmacy services including being ready for the pharmacist to call at least 5 minutes before the scheduled appointment time, to have medication bottles and any blood sugar or blood pressure readings ready for review. The patient agreed to meet with the pharmacist via with the pharmacist via telephone visit on (date/time).  04/23/2023  Penne Lash, RMA Care Guide [facility]  [place], [place] [address] Direct Dial: [phone] [email or web address]

## 2023-04-12 NOTE — Progress Notes (Signed)
   Care Guide Note  04/12/2023 Name: KASON CARN MRN: [id number] DOB: 1956/01/14  Referred by: Etta Grandchild, MD Reason for referral : Care Coordination (Outreach to schedule with Pharmd )   JELISHA RETALLICK is a 67 y.o. year old female who is a primary care patient of Etta Grandchild, MD. Joen Laura was referred to the pharmacist for assistance related to HTN.    An unsuccessful telephone outreach was attempted today to contact the patient who was referred to the pharmacy team for assistance with medication management. Additional attempts will be made to contact the patient.   Penne Lash, RMA Care Guide [facility]  [place], [place] [address] Direct Dial: [phone] [email or web address]

## 2023-04-12 NOTE — Telephone Encounter (Signed)
Pt states that she received a call from [place] and she was wondering if it was her nurse or not from our office Please advise, CB # [phone] Thanks

## 2023-04-13 LAB — QUANTIFERON-TB GOLD PLUS
Mitogen-NIL: 5.99 [IU]/mL
NIL: 0.04 [IU]/mL
QuantiFERON-TB Gold Plus: NEGATIVE
TB1-NIL: <0 [IU]/mL
TB2-NIL: 0 [IU]/mL

## 2023-04-15 NOTE — Telephone Encounter (Signed)
Unable to reach patient. Unable to leave VM. Not sure who called but it wasn't [place].

## 2023-04-18 ENCOUNTER — Other Ambulatory Visit (HOSPITAL_COMMUNITY): Payer: Self-pay

## 2023-04-18 ENCOUNTER — Ambulatory Visit
Admission: RE | Admit: 2023-04-18 | Discharge: 2023-04-18 | Disposition: A | Discharge status: Home / Self Care | Payer: Commercial Managed Care - PPO | Source: Ambulatory Visit | Attending: Internal Medicine | Admitting: Internal Medicine

## 2023-04-18 DIAGNOSIS — D259 Leiomyoma of uterus, unspecified: Secondary | ICD-10-CM | POA: Diagnosis not present

## 2023-04-18 DIAGNOSIS — K50114 Crohn's disease of large intestine with abscess: Secondary | ICD-10-CM

## 2023-04-18 DIAGNOSIS — K60319 Anal fistula, simple, unspecified: Secondary | ICD-10-CM | POA: Diagnosis not present

## 2023-04-18 MED ORDER — GADOPICLENOL 0.5 MMOL/ML IV SOLN
7.5000 mL | Freq: Once | INTRAVENOUS | Status: AC | PRN
  Administered 2023-04-18: 7.5 mL via INTRAVENOUS

## 2023-04-23 ENCOUNTER — Other Ambulatory Visit (INDEPENDENT_AMBULATORY_CARE_PROVIDER_SITE_OTHER): Payer: Commercial Managed Care - PPO

## 2023-04-23 ENCOUNTER — Ambulatory Visit: Payer: Commercial Managed Care - PPO | Admitting: Pharmacist

## 2023-04-23 ENCOUNTER — Other Ambulatory Visit (HOSPITAL_COMMUNITY): Payer: Self-pay

## 2023-04-23 VITALS — BP 124/74 | HR 95

## 2023-04-23 DIAGNOSIS — I10 Essential (primary) hypertension: Secondary | ICD-10-CM | POA: Diagnosis not present

## 2023-04-23 DIAGNOSIS — I480 Paroxysmal atrial fibrillation: Secondary | ICD-10-CM

## 2023-04-23 LAB — BASIC METABOLIC PANEL
BUN: 14 mg/dL (ref 6–23)
CO2: 31 meq/L (ref 19–32)
Calcium: 9.3 mg/dL (ref 8.4–10.5)
Chloride: 101 meq/L (ref 96–112)
Creatinine, Ser: 0.89 mg/dL (ref 0.40–1.20)
GFR: 67.24 mL/min (ref >60.00)
Glucose, Bld: 98 mg/dL (ref 70–99)
Potassium: 3.6 meq/L (ref 3.5–5.1)
Sodium: 139 meq/L (ref 135–145)

## 2023-04-23 MED ORDER — INDAPAMIDE 1.25 MG PO TABS
1.2500 mg | ORAL_TABLET | Freq: Every day | ORAL | 2 refills | Status: DC
Start: 2023-04-23 — End: 2024-03-25
  Filled 2023-04-23 – 2023-07-04 (×2): qty 90, 90d supply, fill #0
  Filled 2023-10-02: qty 90, 90d supply, fill #1
  Filled 2023-12-27: qty 90, 90d supply, fill #2

## 2023-04-23 MED ORDER — METOPROLOL SUCCINATE ER 25 MG PO TB24
25.0000 mg | ORAL_TABLET | Freq: Two times a day (BID) | ORAL | 2 refills | Status: DC
  Filled 2023-04-23 – 2023-06-11 (×2): qty 180, 90d supply, fill #0
  Filled 2023-09-09: qty 180, 90d supply, fill #1
  Filled 2023-12-04: qty 180, 90d supply, fill #2

## 2023-04-23 NOTE — Patient Instructions (Signed)
It was a pleasure speaking with you today!  Continue your current medication regimen. I recommend checking blood pressure occasionally to make sure it is averaging less than 130/80.  Feel free to call with any questions or concerns!  Arbutus Leas, PharmD, BCPS [facility] Clinical Pharmacist [facility] [phone]

## 2023-04-23 NOTE — Progress Notes (Signed)
04/23/2023 Name: Julie Clark MRN: [id number] DOB: 17-Feb-1956  Chief Complaint  Patient presents with   Hypertension   Medication Management    OYINDAMOLA MUNTER is a 67 y.o. year old female who was referred for medication management by their primary care provider, Etta Grandchild, MD. They presented for a face to face visit today.   They were referred to the pharmacist by their PCP for assistance in managing hypertension   BP 132/70 Indapamide Metoprolol refills  Subjective:  Care Team: Primary Care Provider: Etta Grandchild, MD ; Next Scheduled Visit: not scheduled  Medication Access/Adherence  Current Pharmacy:  [facility] [address] [place] [address] Phone: [phone] Fax: [phone]   Patient reports affordability concerns with their medications: No  Patient reports access/transportation concerns to their pharmacy: No  Patient reports adherence concerns with their medications:  Yes    Pt notes she has not been taking rosuvastatin and did not feel she needed it after her most recent lipid panel was all WNL off of the medicatoin   Hypertension:  Current medications: indapamide 1.25 mg daily (started 12/2022), diltiazem 120 mg daily (afib), metoprolol succinate 25 mg daily Medications previously tried: amlodipine, HCTZ  Patient has a validated, automated, upper arm home BP cuff Current blood pressure readings readings: 132/70 earlier today at work   Current physical activity: limited due to arthritis  Objective: BP Readings from Last 3 Encounters:  04/23/23 124/74  04/09/23 (!) 154/75  02/26/23 (!) 143/76     No results found for: "HGBA1C"  Lab Results  Component Value Date   CREATININE 0.75 01/09/2023   BUN 20 01/09/2023   NA 136 01/09/2023   K 4.0 01/09/2023   CL 100 01/09/2023   CO2 29 01/09/2023    Lab Results  Component Value Date   CHOL 117 01/10/2023   HDL 47.90 01/10/2023   LDLCALC 61  01/10/2023   TRIG 40.0 01/10/2023   CHOLHDL 2 01/10/2023    Medications Reviewed Today     Reviewed by Bonita Quin, RPH (Pharmacist) on 04/23/23 at 1518  Med List Status: <None>   Medication Order Taking? Sig Documenting Provider Last Dose Status Informant  0.9 %  sodium chloride infusion [id number]   Pyrtle, Carie Caddy, MD  Active   acetaminophen (TYLENOL) 500 MG tablet [id number] Yes Take 500 mg by mouth every 6 (six) hours as needed. [provider] Taking Active Self, Pharmacy Records  apixaban (ELIQUIS) 5 MG TABS tablet [id number] Yes Take 1 tablet (5 mg total) by mouth 2 (two) times daily. Regan Lemming, MD Taking Active   diltiazem (CARDIZEM CD) 120 MG 24 hr capsule [id number] Yes Take 1 capsule (120 mg total) by mouth daily. Regan Lemming, MD Taking Active   diltiazem (CARDIZEM) 30 MG tablet [id number] Yes Take 1 tablet every 4 hours AS NEEDED for AFIB heart rate >100 as long as top BP >100. Fenton, [place] R, PA Taking Active   folic acid (FOLVITE) 1 MG tablet [id number]  Take 1 tablet (1 mg total) by mouth daily.  Patient not taking: Reported on 01/29/2023   Etta Grandchild, MD  Active   indapamide (LOZOL) 1.25 MG tablet [id number] Yes Take 1 tablet (1.25 mg total) by mouth daily. Etta Grandchild, MD Taking Active   inFLIXimab [facility] IV) [id number]  Inject into the vein. Every 6 weeks [provider]  Active Self, Pharmacy Records  Med Note (BRIDGES, JACQUELINE L   Fri Oct 06, 2021  9:08 AM)    levofloxacin (LEVAQUIN) 500 MG tablet [id number]  Take 1 tablet (500 mg total) by mouth daily. Pyrtle, Carie Caddy, MD  Active   metoprolol succinate (TOPROL-XL) 25 MG 24 hr tablet [id number] Yes Take 1 tablet (25 mg total) by mouth 2 (two) times daily. Etta Grandchild, MD Taking Active   Polyethylene Glycol 3350 (MIRALAX PO) [id number] Yes Take 1 Package by mouth daily. Mix powder with liquid and take once daily [provider] Taking Active Self, Pharmacy  Records  rosuvastatin (CRESTOR) 5 MG tablet [id number] No Take 1 tablet (5 mg total) by mouth daily.  Patient not taking: Reported on 01/29/2023   Etta Grandchild, MD Not Taking Active               Assessment/Plan:   Hypertension: - Currently controlled, BP goal <130/80. BP well controlled in office today. - Reviewed appropriate blood pressure monitoring technique and reviewed goal blood pressure. Recommended to check home blood pressure and heart rate  - Recommend to continue indapamide. Will get BMP today to check potassium and renal function. - Will send refills needed for indapamide and metoprolol  - Continue regular PCP follow up (yearly)  HLD: Last lipid panel very well controlled. No hx ASCVD or T2DM. No known reason for significant decrease from previous (no weight loss or change in exercise/diet). Recommend remain off of rosuvastatin and wait for next lipid panel.    Follow Up Plan: PRN  Arbutus Leas, PharmD, BCPS, CPP Clinical Pharmacist Practitioner [facility] [phone]

## 2023-05-20 ENCOUNTER — Ambulatory Visit: Payer: Commercial Managed Care - PPO

## 2023-05-20 VITALS — BP 143/77 | HR 55 | Temp 97.8°F | Resp 16 | Ht 64.0 in | Wt 177.0 lb

## 2023-05-20 DIAGNOSIS — K50119 Crohn's disease of large intestine with unspecified complications: Secondary | ICD-10-CM

## 2023-05-20 DIAGNOSIS — K50114 Crohn's disease of large intestine with abscess: Secondary | ICD-10-CM

## 2023-05-20 MED ORDER — DIPHENHYDRAMINE HCL 25 MG PO CAPS: 25.0000 mg | ORAL_CAPSULE | Freq: Once | ORAL | Status: DC

## 2023-05-20 MED ORDER — ACETAMINOPHEN 325 MG PO TABS
650.0000 mg | ORAL_TABLET | Freq: Once | ORAL | Status: DC
Start: 2023-05-20 — End: 2023-05-20

## 2023-05-20 MED ORDER — METHYLPREDNISOLONE SODIUM SUCC 40 MG IJ SOLR: 40.0000 mg | Freq: Once | INTRAMUSCULAR | Status: DC

## 2023-05-20 MED ORDER — SODIUM CHLORIDE 0.9 % IV SOLN
10.0000 mg/kg | Freq: Once | INTRAVENOUS | Status: AC
  Administered 2023-05-20: 800 mg via INTRAVENOUS
  Filled 2023-05-20: qty 80

## 2023-05-20 NOTE — Progress Notes (Signed)
Diagnosis: Crohn's Disease  Provider:  Chilton Greathouse MD  Procedure: IV Infusion  IV Type: Peripheral, IV Location: L Antecubital  Avsola (infliximab-axxq), Dose: 800 mg  Infusion Start Time: 0923  am  Infusion Stop Time: 1131 am  Post Infusion IV Care: Observation period completed and Peripheral IV Discontinued  Discharge: Condition: Good, Destination: Home . AVS Declined  Performed by:  Forrest Moron, RN

## 2023-05-31 ENCOUNTER — Other Ambulatory Visit (HOSPITAL_COMMUNITY): Payer: Self-pay

## 2023-06-05 ENCOUNTER — Other Ambulatory Visit: Payer: Self-pay | Admitting: Internal Medicine

## 2023-06-05 ENCOUNTER — Other Ambulatory Visit (HOSPITAL_COMMUNITY): Payer: Self-pay

## 2023-06-05 DIAGNOSIS — K50119 Crohn's disease of large intestine with unspecified complications: Secondary | ICD-10-CM

## 2023-06-05 MED ORDER — AZATHIOPRINE 100 MG PO TABS
100.0000 mg | ORAL_TABLET | Freq: Every day | ORAL | 3 refills | Status: DC
Start: 2023-06-05 — End: 2024-03-25
  Filled 2023-06-05: qty 90, 90d supply, fill #0
  Filled 2023-08-29: qty 90, 90d supply, fill #1
  Filled 2023-12-04: qty 90, 90d supply, fill #2
  Filled 2024-03-02: qty 90, 90d supply, fill #3

## 2023-06-05 MED ORDER — CEPHALEXIN 500 MG PO CAPS
2000.0000 mg | ORAL_CAPSULE | ORAL | 1 refills | Status: AC
  Filled 2023-06-05 – 2023-06-07 (×2): qty 28, 7d supply, fill #0

## 2023-06-05 NOTE — Progress Notes (Signed)
 Refilling aza 100 mg daily

## 2023-06-06 ENCOUNTER — Other Ambulatory Visit: Payer: Self-pay

## 2023-06-06 ENCOUNTER — Other Ambulatory Visit (HOSPITAL_COMMUNITY): Payer: Self-pay

## 2023-06-07 ENCOUNTER — Other Ambulatory Visit: Payer: Self-pay

## 2023-06-07 ENCOUNTER — Other Ambulatory Visit (HOSPITAL_COMMUNITY): Payer: Self-pay

## 2023-06-10 ENCOUNTER — Ambulatory Visit: Payer: Commercial Managed Care - PPO | Admitting: Sports Medicine

## 2023-06-10 VITALS — HR 68 | Ht 64.0 in | Wt 177.0 lb

## 2023-06-10 DIAGNOSIS — G8929 Other chronic pain: Secondary | ICD-10-CM

## 2023-06-10 DIAGNOSIS — M7551 Bursitis of right shoulder: Secondary | ICD-10-CM

## 2023-06-10 DIAGNOSIS — M25511 Pain in right shoulder: Secondary | ICD-10-CM

## 2023-06-10 NOTE — Progress Notes (Signed)
 Ben Maecyn Panning D.[facility] [address] [place] [address] Phone: [phone]   Assessment and Plan:     1. Chronic right shoulder pain 2. Subacromial bursitis of right shoulder joint  -Chronic with exacerbation, initial sports medicine visit - Acute flare of chronic right shoulder pain, most consistent with subacromial bursitis likely caused from side sleeping and physical activity - Patient has had significant relief in the past with CSI and elected for repeat subacromial CSI today.  Tolerated well per note below.  Patient had increased risk of bleeding with past medical history of chronic anticoagulation on Eliquis  - Start Tylenol  500 to 1000 mg tablets 2-3 times a day for day-to-day pain relief  Procedure: Subacromial Injection Side: Right  Risks explained and consent was given verbally. The site was cleaned with alcohol prep. A steroid injection was performed from posterior approach using 2mL of 1% lidocaine  without epinephrine  and 1mL of kenalog 40mg /ml. This was well tolerated and resulted in symptomatic relief.  Needle was removed, hemostasis achieved, and post injection instructions were explained.   Pt was advised to call or return to clinic if these symptoms worsen or fail to improve as anticipated.   Pertinent previous records reviewed include none  Follow Up: 2 to 3 weeks for reevaluation.  If no improvement or worsening of symptoms, would obtain right shoulder x-ray and could consider alternative CSI versus physical therapy   Subjective:   I, Moenique Parris, am serving as a neurosurgeon for Doctor Morene Mace  Chief Complaint: right shoulder pain   HPI:   06/10/23 Patient is a 68 year old female with right shoulder pain. Patient states she had this 15 years ago and thinks she needs a CSI. Pain been going on for a week. Decreased ROM. Pain when she lays down to sleep. Tylenol  has helped at all   Relevant Historical  Information: Crohn's disease, on chronic anticoagulation with Eliquis , hypertension   Additional pertinent review of systems negative.   Current Outpatient Medications:    acetaminophen  (TYLENOL ) 500 MG tablet, Take 500 mg by mouth every 6 (six) hours as needed., Disp: , Rfl:    apixaban  (ELIQUIS ) 5 MG TABS tablet, Take 1 tablet (5 mg total) by mouth 2 (two) times daily., Disp: 180 tablet, Rfl: 2   azathioprine  (IMURAN ) 100 MG tablet, Take 1 tablet (100 mg total) by mouth daily., Disp: 90 tablet, Rfl: 3   cephALEXin  (KEFLEX ) 500 MG capsule, Take 4 capsules (2,000 mg total) by mouth 1 hour before procedure as directed., Disp: 28 capsule, Rfl: 1   diltiazem  (CARDIZEM  CD) 120 MG 24 hr capsule, Take 1 capsule (120 mg total) by mouth daily., Disp: 90 capsule, Rfl: 2   diltiazem  (CARDIZEM ) 30 MG tablet, Take 1 tablet every 4 hours AS NEEDED for AFIB heart rate >100 as long as top BP >100., Disp: 30 tablet, Rfl: 1   folic acid  (FOLVITE ) 1 MG tablet, Take 1 tablet (1 mg total) by mouth daily., Disp: 90 tablet, Rfl: 1   indapamide  (LOZOL ) 1.25 MG tablet, Take 1 tablet (1.25 mg total) by mouth daily., Disp: 90 tablet, Rfl: 2   inFLIXimab  (REMICADE  IV), Inject into the vein. Every 6 weeks, Disp: , Rfl:    levofloxacin  (LEVAQUIN ) 500 MG tablet, Take 1 tablet (500 mg total) by mouth daily., Disp: 10 tablet, Rfl: 0   metoprolol  succinate (TOPROL -XL) 25 MG 24 hr tablet, Take 1 tablet (25 mg total) by mouth 2 (two) times daily., Disp:  180 tablet, Rfl: 2   Polyethylene Glycol 3350  (MIRALAX PO), Take 1 Package by mouth daily. Mix powder with liquid and take once daily, Disp: , Rfl:   Current Facility-Administered Medications:    0.9 %  sodium chloride  infusion, 500 mL, Intravenous, Once, Pyrtle, Gordy HERO, MD   Objective:     Vitals:   06/10/23 1559  Pulse: 68  SpO2: 98%  Weight: 177 lb (80.3 kg)  Height: 5' 4 (1.626 m)      Body mass index is 30.38 kg/m.    Physical Exam:     Gen: Appears well, nad,  nontoxic and pleasant Neuro:sensation intact, strength is 5/5 with df/pf/inv/ev, muscle tone wnl Skin: no suspicious lesion or defmority Psych: A&O, appropriate mood and affect  Right shoulder:  No deformity, swelling or muscle wasting No scapular winging FF  80, abd 80, int 20, ext 70 TTP deltoid, biceps groove NTTP over the [place], clavicle, ac,   humerus,  , trapezius, cervical spine Special testing limited due to limited ROM Neg ant drawer, sulcus sign, apprehension Negative Spurling's test bilat FROM of neck    Electronically signed by:  Odis Mace D.[facility] 4:25 PM 06/10/23

## 2023-06-10 NOTE — Patient Instructions (Signed)
2-3 week follow up  Tylenol [phone] mg 2-3 times a day for pain relief

## 2023-06-11 ENCOUNTER — Ambulatory Visit: Payer: Commercial Managed Care - PPO | Admitting: Sports Medicine

## 2023-06-11 ENCOUNTER — Other Ambulatory Visit (HOSPITAL_COMMUNITY): Payer: Self-pay

## 2023-06-17 ENCOUNTER — Other Ambulatory Visit (HOSPITAL_COMMUNITY): Payer: Self-pay

## 2023-06-18 ENCOUNTER — Other Ambulatory Visit (HOSPITAL_COMMUNITY): Payer: Self-pay

## 2023-06-18 ENCOUNTER — Telehealth: Payer: Self-pay | Admitting: Pharmacy Technician

## 2023-06-18 NOTE — Telephone Encounter (Signed)
PA  renewal:  Auth Submission: APPROVED Site of care: Site of care: [facility] Payer: [employer] Medication & CPT/J Code(s) submitted: Avsola (infliximab-axxq) [id number] Route of submission (phone, fax, portal): portal Phone # Fax # Auth type: Buy/Bill PB Units/visits requested: 8 doses q6wks Reference number: [id number] Approval from: 06/17/23 to 06/15/24

## 2023-06-24 ENCOUNTER — Telehealth: Payer: Self-pay

## 2023-06-24 NOTE — Progress Notes (Unsigned)
    Aleen Sells D.[facility] [address] [place] [address] Phone: [phone]   Assessment and Plan:     There are no diagnoses linked to this encounter.  ***   Pertinent previous records reviewed include ***    Follow Up: ***     Subjective:   I, Posey Jasmin, am serving as a Neurosurgeon for Doctor Richardean Sale   Chief Complaint: right shoulder pain    HPI:    06/10/23 Patient is a 68 year old female with right shoulder pain. Patient states she had this 15 years ago and thinks she needs a CSI. Pain been going on for a week. Decreased ROM. Pain when she lays down to sleep. Tylenol has helped at all   06/25/2023 Patient states   Relevant Historical Information: Crohn's disease, on chronic anticoagulation with Eliquis, hypertension   Additional pertinent review of systems negative.   Current Outpatient Medications:    acetaminophen (TYLENOL) 500 MG tablet, Take 500 mg by mouth every 6 (six) hours as needed., Disp: , Rfl:    apixaban (ELIQUIS) 5 MG TABS tablet, Take 1 tablet (5 mg total) by mouth 2 (two) times daily., Disp: 180 tablet, Rfl: 2   azathioprine (IMURAN) 100 MG tablet, Take 1 tablet (100 mg total) by mouth daily., Disp: 90 tablet, Rfl: 3   cephALEXin (KEFLEX) 500 MG capsule, Take 4 capsules (2,000 mg total) by mouth 1 hour before procedure as directed., Disp: 28 capsule, Rfl: 1   diltiazem (CARDIZEM CD) 120 MG 24 hr capsule, Take 1 capsule (120 mg total) by mouth daily., Disp: 90 capsule, Rfl: 2   diltiazem (CARDIZEM) 30 MG tablet, Take 1 tablet every 4 hours AS NEEDED for AFIB heart rate >100 as long as top BP >100., Disp: 30 tablet, Rfl: 1   folic acid (FOLVITE) 1 MG tablet, Take 1 tablet (1 mg total) by mouth daily., Disp: 90 tablet, Rfl: 1   indapamide (LOZOL) 1.25 MG tablet, Take 1 tablet (1.25 mg total) by mouth daily., Disp: 90 tablet, Rfl: 2   inFLIXimab (REMICADE IV), Inject into the vein. Every 6 weeks, Disp: ,  Rfl:    levofloxacin (LEVAQUIN) 500 MG tablet, Take 1 tablet (500 mg total) by mouth daily., Disp: 10 tablet, Rfl: 0   metoprolol succinate (TOPROL-XL) 25 MG 24 hr tablet, Take 1 tablet (25 mg total) by mouth 2 (two) times daily., Disp: 180 tablet, Rfl: 2   Polyethylene Glycol 3350 (MIRALAX PO), Take 1 Package by mouth daily. Mix powder with liquid and take once daily, Disp: , Rfl:   Current Facility-Administered Medications:    0.9 %  sodium chloride infusion, 500 mL, Intravenous, Once, Pyrtle, Carie Caddy, MD   Objective:     There were no vitals filed for this visit.    There is no height or weight on file to calculate BMI.    Physical Exam:    ***   Electronically signed by:  Aleen Sells D[facility] 1:56 PM 06/24/23

## 2023-06-24 NOTE — Telephone Encounter (Signed)
Auth Submission: APPROVED Site of care: Site of care: [facility] Payer: [employer] Medication & CPT/J Code(s) submitted: Avsola (infliximab-axxq) [phone] Route of submission (phone, fax, portal): fax Phone # Fax # [phone]  Auth type: Buy/Bill PB Units/visits requested: 800mg  x 9 doses Reference number: [id number] Approval from: 06/17/23 to 06/15/24

## 2023-06-25 ENCOUNTER — Ambulatory Visit: Payer: Commercial Managed Care - PPO | Admitting: Sports Medicine

## 2023-06-25 VITALS — HR 80 | Ht 64.0 in | Wt 177.0 lb

## 2023-06-25 DIAGNOSIS — G8929 Other chronic pain: Secondary | ICD-10-CM

## 2023-06-25 DIAGNOSIS — M25511 Pain in right shoulder: Secondary | ICD-10-CM | POA: Diagnosis not present

## 2023-06-25 DIAGNOSIS — M7551 Bursitis of right shoulder: Secondary | ICD-10-CM | POA: Diagnosis not present

## 2023-06-26 ENCOUNTER — Telehealth: Payer: Self-pay | Admitting: Cardiology

## 2023-06-26 ENCOUNTER — Other Ambulatory Visit: Payer: Self-pay

## 2023-06-26 ENCOUNTER — Other Ambulatory Visit (HOSPITAL_COMMUNITY): Payer: Self-pay

## 2023-06-26 ENCOUNTER — Other Ambulatory Visit: Payer: Self-pay | Admitting: Cardiology

## 2023-06-26 ENCOUNTER — Encounter: Payer: Self-pay | Admitting: Gastroenterology

## 2023-06-26 MED ORDER — DILTIAZEM HCL ER COATED BEADS 120 MG PO CP24
120.0000 mg | ORAL_CAPSULE | Freq: Every day | ORAL | 0 refills | Status: DC
  Filled 2023-06-26 (×2): qty 90, 90d supply, fill #0

## 2023-06-26 NOTE — Telephone Encounter (Signed)
Pt's medication was sent to pt's pharmacy as requested. Confirmation received.

## 2023-06-26 NOTE — Telephone Encounter (Signed)
*  STAT* If patient is at the pharmacy, call can be transferred to refill team.   1. Which medications need to be refilled? (please list name of each medication and dose if known) diltiazem (CARDIZEM CD) 120 MG 24 hr capsule    2. Would you like to learn more about the convenience, safety, & potential cost savings by using the [facility]? Yes      3. Are you open to using the [facility] (Type [facility]. Yes  ).   4. Which pharmacy/location (including street and city if local pharmacy) is medication to be sent to? [place] [facility]    5. Do they need a 30 day or 90 day supply? 90

## 2023-07-02 ENCOUNTER — Ambulatory Visit: Payer: Commercial Managed Care - PPO | Admitting: *Deleted

## 2023-07-02 VITALS — BP 136/77 | HR 56 | Temp 97.4°F | Resp 16 | Ht 64.0 in | Wt 172.4 lb

## 2023-07-02 DIAGNOSIS — K50119 Crohn's disease of large intestine with unspecified complications: Secondary | ICD-10-CM

## 2023-07-02 DIAGNOSIS — K50114 Crohn's disease of large intestine with abscess: Secondary | ICD-10-CM

## 2023-07-02 MED ORDER — INFLIXIMAB-AXXQ 100 MG IV SOLR
10.0000 mg/kg | Freq: Once | INTRAVENOUS | Status: AC
  Administered 2023-07-02: 800 mg via INTRAVENOUS
  Filled 2023-07-02: qty 80

## 2023-07-02 MED ORDER — DIPHENHYDRAMINE HCL 25 MG PO CAPS
25.0000 mg | ORAL_CAPSULE | Freq: Once | ORAL | Status: AC
  Administered 2023-07-02: 25 mg via ORAL
  Filled 2023-07-02: qty 1

## 2023-07-02 NOTE — Progress Notes (Signed)
 Diagnosis: Crohn's Disease  Provider:  Mannam, Praveen MD  Procedure: IV Infusion  IV Type: Peripheral, IV Location: R Antecubital  Remicade  (Infliximab ), Dose: 800 mg  Infusion Start Time: 0916 am  Infusion Stop Time: 1129 am  Post Infusion IV Care: Observation period completed and Peripheral IV Discontinued  Discharge: Condition: Good, Destination: Home . AVS Declined  Performed by:  Trudy Lamarr LABOR, RN

## 2023-07-04 ENCOUNTER — Other Ambulatory Visit (HOSPITAL_COMMUNITY): Payer: Self-pay

## 2023-07-11 ENCOUNTER — Other Ambulatory Visit (HOSPITAL_COMMUNITY): Payer: Self-pay

## 2023-07-11 ENCOUNTER — Other Ambulatory Visit: Payer: Self-pay | Admitting: Cardiology

## 2023-07-11 DIAGNOSIS — I480 Paroxysmal atrial fibrillation: Secondary | ICD-10-CM

## 2023-07-11 MED ORDER — ELIQUIS 5 MG PO TABS
5.0000 mg | ORAL_TABLET | Freq: Two times a day (BID) | ORAL | 0 refills | Status: DC
  Filled 2023-07-11: qty 180, 90d supply, fill #0

## 2023-07-11 NOTE — Telephone Encounter (Signed)
Prescription refill request for Eliquis received. Indication: PAF Last office visit: 08/01/22  Carleene Mains MD Scr: 0.89 on 04/23/23  Epic Age: 68 Weight: 80.7kg  Based on above findings Eliquis 5mg  twice daily is the appropriate dose.  Pt id past due for appt with Dr Gerre Pebbles.  Message sent to schedulers to make appt.  Refill approved x 1.

## 2023-07-29 ENCOUNTER — Other Ambulatory Visit (HOSPITAL_COMMUNITY): Payer: Self-pay

## 2023-07-29 ENCOUNTER — Ambulatory Visit (HOSPITAL_COMMUNITY)
Admission: RE | Admit: 2023-07-29 | Discharge: 2023-07-29 | Disposition: A | Discharge status: Home / Self Care | Source: Ambulatory Visit | Attending: Internal Medicine | Admitting: Internal Medicine

## 2023-07-29 ENCOUNTER — Other Ambulatory Visit (HOSPITAL_BASED_OUTPATIENT_CLINIC_OR_DEPARTMENT_OTHER): Payer: Self-pay

## 2023-07-29 ENCOUNTER — Other Ambulatory Visit: Payer: Self-pay

## 2023-07-29 VITALS — BP 140/70 | HR 71 | Ht 64.0 in | Wt 173.8 lb

## 2023-07-29 DIAGNOSIS — D6869 Other thrombophilia: Secondary | ICD-10-CM | POA: Insufficient documentation

## 2023-07-29 DIAGNOSIS — I480 Paroxysmal atrial fibrillation: Secondary | ICD-10-CM | POA: Insufficient documentation

## 2023-07-29 DIAGNOSIS — I10 Essential (primary) hypertension: Secondary | ICD-10-CM | POA: Diagnosis not present

## 2023-07-29 DIAGNOSIS — E785 Hyperlipidemia, unspecified: Secondary | ICD-10-CM | POA: Insufficient documentation

## 2023-07-29 DIAGNOSIS — Z7901 Long term (current) use of anticoagulants: Secondary | ICD-10-CM | POA: Diagnosis not present

## 2023-07-29 MED ORDER — DILTIAZEM HCL ER COATED BEADS 180 MG PO CP24
180.0000 mg | ORAL_CAPSULE | Freq: Every day | ORAL | 3 refills | Status: DC
  Filled 2023-07-29: qty 30, 30d supply, fill #0
  Filled 2023-08-26: qty 90, 90d supply, fill #1

## 2023-07-29 NOTE — Progress Notes (Signed)
 Primary Care Physician: Etta Grandchild, MD Primary Cardiologist: Will Jorja Loa, MD Electrophysiologist: Will Jorja Loa, MD     Referring Physician:      [name] is a 68 y.o. female with a history of HTN, HLD, and paroxysmal atrial fibrillation who presents for consultation in the [facility]. History of flecainide pill in pocket strategy discontinued due to syncopal episode after a dose. She currently takes diltiazem 120 mg daily and Toprol 25 mg BID. Patient is on Eliquis 5 mg BID for a CHADS2VASC score of 3.  On evaluation today, she is currently in NSR. She converted to NSR spontaneously while taking PRN diltiazem. She notes to have 1-2 episodes of Afib per year. No missed doses of Eliquis 5 mg BID.   Today, she denies symptoms of chest pain, shortness of breath, orthopnea, PND, lower extremity edema, dizziness, presyncope, syncope, snoring, daytime somnolence, bleeding, or neurologic sequela. The patient is tolerating medications without difficulties and is otherwise without complaint today.   she has a BMI of Body mass index is 29.83 kg/m.Marland Kitchen Filed Weights   07/29/23 1451  Weight: 78.8 kg    Current Outpatient Medications  Medication Sig Dispense Refill   acetaminophen (TYLENOL) 500 MG tablet Take 500 mg by mouth as needed.     azathioprine (IMURAN) 100 MG tablet Take 1 tablet (100 mg total) by mouth daily. 90 tablet 3   cephALEXin (KEFLEX) 500 MG capsule Take 4 capsules (2,000 mg total) by mouth 1 hour before procedure as directed. 28 capsule 1   diltiazem (CARDIZEM) 30 MG tablet Take 1 tablet every 4 hours AS NEEDED for AFIB heart rate >100 as long as top BP >100. 30 tablet 1   ELIQUIS 5 MG TABS tablet Take 1 tablet (5 mg total) by mouth 2 (two) times daily. 180 tablet 0   indapamide (LOZOL) 1.25 MG tablet Take 1 tablet (1.25 mg total) by mouth daily. 90 tablet 2   inFLIXimab (REMICADE IV) Inject into the vein. Every 6 weeks      metoprolol succinate (TOPROL-XL) 25 MG 24 hr tablet Take 1 tablet (25 mg total) by mouth 2 (two) times daily. 180 tablet 2   Polyethylene Glycol 3350 (MIRALAX PO) Take 1 Package by mouth daily. Mix powder with liquid and take once daily     diltiazem (CARDIZEM CD) 180 MG 24 hr capsule Take 1 capsule (180 mg total) by mouth daily. 30 capsule 3   Current Facility-Administered Medications  Medication Dose Route Frequency Provider Last Rate Last Admin   0.9 %  sodium chloride infusion  500 mL Intravenous Once Pyrtle, Carie Caddy, MD        Atrial Fibrillation Management history:  Previous antiarrhythmic drugs: flecainide pill in pocket Previous cardioversions: none Previous ablations: none Anticoagulation history: Eliquis   ROS- All systems are reviewed and negative except as per the HPI above.  Physical Exam: BP (!) 140/70   Pulse 71   Ht 5\' 4"  (1.626 m)   Wt 78.8 kg   BMI 29.83 kg/m   GEN: Well nourished, well developed in no acute distress NECK: No JVD; No carotid bruits CARDIAC: Regular rate and rhythm, no murmurs, rubs, gallops RESPIRATORY:  Clear to auscultation without rales, wheezing or rhonchi  ABDOMEN: Soft, non-tender, non-distended EXTREMITIES:  No edema; No deformity   EKG today demonstrates  Vent. rate 71 BPM PR interval 154 ms QRS duration 92 ms QT/QTcB 392/425 ms P-R-T axes 51 28 4 Normal  sinus rhythm Cannot rule out Anterior infarct , age undetermined Abnormal ECG When compared with ECG of 15-Sep-2021 12:15, PREVIOUS ECG IS PRESENT  Echo 09/25/21 demonstrated  1. Left ventricular ejection fraction, by estimation, is 50 to 55%. The  left ventricle has low normal function. The left ventricle has no regional  wall motion abnormalities. Left ventricular diastolic parameters are  consistent with Grade II diastolic  dysfunction (pseudonormalization).   2. Right ventricular systolic function is normal. The right ventricular  size is normal. There is normal pulmonary  artery systolic pressure. The  estimated right ventricular systolic pressure is 30.5 mmHg.   3. The mitral valve is normal in structure. No evidence of mitral valve  regurgitation. No evidence of mitral stenosis.   4. The aortic valve is tricuspid. Aortic valve regurgitation is not  visualized. No aortic stenosis is present.   5. Frequent PVCs through study.    ASSESSMENT & PLAN CHA2DS2-VASc Score = 3  The patient's score is based upon: CHF History: 0 HTN History: 1 Diabetes History: 0 Stroke History: 0 Vascular Disease History: 0 Age Score: 1 Gender Score: 1       ASSESSMENT AND PLAN: Paroxysmal Atrial Fibrillation (ICD10:  I48.0) The patient's CHA2DS2-VASc score is 3, indicating a 3.2% annual risk of stroke.    She is currently in NSR. She converted spontaneously to NSR while taking PRN diltiazem. After discussion of options, patient is unsure if wants to do ablation. We will attempt increase in rate control to diltiazem 180 mg daily. She is due for annual follow up with Dr. Elberta Fortis and will decide by then if she wishes to pursue ablation or continue rate control.   Secondary Hypercoagulable State (ICD10:  D68.69) The patient is at significant risk for stroke/thromboembolism based upon her CHA2DS2-VASc Score of 3.  Continue Apixaban (Eliquis).  Continue Eliquis without interruption.    Follow up with Dr. Elberta Fortis.    Lake Bells, PA-C  [facility] [address] [place], [place] [address] [phone]

## 2023-08-13 ENCOUNTER — Ambulatory Visit: Payer: Commercial Managed Care - PPO | Admitting: *Deleted

## 2023-08-13 VITALS — BP 149/78 | HR 55 | Temp 97.7°F | Resp 16 | Ht 64.0 in | Wt 176.8 lb

## 2023-08-13 DIAGNOSIS — K50114 Crohn's disease of large intestine with abscess: Secondary | ICD-10-CM

## 2023-08-13 DIAGNOSIS — K50119 Crohn's disease of large intestine with unspecified complications: Secondary | ICD-10-CM

## 2023-08-13 MED ORDER — ACETAMINOPHEN 325 MG PO TABS: 650.0000 mg | ORAL_TABLET | Freq: Once | ORAL | Status: DC

## 2023-08-13 MED ORDER — DIPHENHYDRAMINE HCL 25 MG PO CAPS: 25.0000 mg | ORAL_CAPSULE | Freq: Once | ORAL | Status: DC

## 2023-08-13 MED ORDER — METHYLPREDNISOLONE SODIUM SUCC 40 MG IJ SOLR: 40.0000 mg | Freq: Once | INTRAMUSCULAR | Status: DC

## 2023-08-13 MED ORDER — SODIUM CHLORIDE 0.9 % IV SOLN
10.0000 mg/kg | Freq: Once | INTRAVENOUS | Status: AC
  Administered 2023-08-13: 800 mg via INTRAVENOUS
  Filled 2023-08-13: qty 80

## 2023-08-13 NOTE — Progress Notes (Signed)
 Diagnosis: Crohn's Disease  Provider:  Chilton Greathouse MD  Procedure: IV Infusion  IV Type: Peripheral, IV Location: R Antecubital  Avsola (infliximab-axxq), Dose:  800mg   Infusion Start Time  0847 am  Infusion Stop Time: 1110 am  Post Infusion IV Care: Observation period completed and Peripheral IV Discontinued  Discharge: Condition: Good, Destination: Home . AVS Declined  Performed by:  Forrest Moron, RN

## 2023-08-14 ENCOUNTER — Telehealth: Payer: Self-pay | Admitting: Cardiology

## 2023-08-14 NOTE — Telephone Encounter (Signed)
 Follow Up:       Julie Clark is checking on the status of patient's clearance. Please fax asap to [phone]

## 2023-08-14 NOTE — Telephone Encounter (Signed)
 Will send to preop APP to review if the pt has been cleared.

## 2023-08-15 NOTE — Telephone Encounter (Signed)
 Pt is calling back because they are telling her they have not received anything from [place]. Pt states she is having a root canal not a cleaning. Please send to Dr Kayren Eaves, DDS

## 2023-08-15 NOTE — Telephone Encounter (Signed)
   Pre-operative Risk Assessment    Patient Name: Julie Clark  DOB: 1956/04/05 MRN: [id number]   Date of last office visit: 08/01/22 DR. CAMNITZ Date of next office visit: 10/03/23 DR. CAMNITZ  Request for Surgical Clearance    Procedure:   ROOT CANAL COMPLETION  Date of Surgery:  Clearance 08/20/23                                Surgeon:  DR. Elliot Dally, DDS Surgeon's Group or Practice Name:   Phone number:  [phone] ATTN: MARIE Fax number:  [phone]   Type of Clearance Requested:   - Pharmacy:  Hold Apixaban (Eliquis) PER MARIE JUST NEED TO KNOW IF CAN HOLD ELIQUIS   Type of Anesthesia:  Local    Additional requests/questions:    [name]   08/15/2023, 9:04 AM

## 2023-08-15 NOTE — Telephone Encounter (Signed)
    Primary Cardiologist: Will Jorja Loa, MD  Chart reviewed as part of pre-operative protocol coverage. Simple dental extractions are considered low risk procedures per guidelines and generally do not require any specific cardiac clearance. It is also generally accepted that for simple extractions and dental cleanings, there is no need to interrupt blood thinner therapy.   SBE prophylaxis is not required for the patient.  I will route this recommendation to the requesting party via Epic fax function and remove from pre-op pool.  Please call with questions.  Ronney Asters, NP 08/15/2023, 9:28 AM

## 2023-08-15 NOTE — Telephone Encounter (Signed)
 I have reviewed the chart as well and do not see a clearance request. I will reach out to Dr. Imagene Gurney office as that is who called.

## 2023-08-16 NOTE — Telephone Encounter (Signed)
   Patient Name: Julie Clark  DOB: 07-May-1956 MRN: [id number]  Primary Cardiologist: Will Jorja Loa, MD Procedure: Root canal completion   Clinical pharmacists have reviewed the patient's past medical history, labs, and current medications as part of preoperative protocol coverage. The following recommendations have been made:  Procedure: ROOT CANAL COMPLETION  Date of procedure: 08/20/23   CHA2DS2-VASc Score = 3   This indicates a 3.2% annual risk of stroke. The patient's score is based upon: CHF History: 0 HTN History: 1 Diabetes History: 0 Stroke History: 0 Vascular Disease History: 0 Age Score: 1 Gender Score: 1  Patient does not require pre-op antibiotics for dental procedure.   Per office protocol, patient can hold Eliquis for 1 days prior to procedure.  Please resume as soon as safe to do so from a bleeding standpoint. Patient will not need bridging with Lovenox (enoxaparin) around procedure.  I will route this recommendation to the requesting party via Epic fax function and remove from pre-op pool.  Please call with questions.  Rip Harbour, NP 08/16/2023, 2:53 PM

## 2023-08-16 NOTE — Telephone Encounter (Signed)
 Patient with diagnosis of afib on Eliquis for anticoagulation.    Procedure: ROOT CANAL COMPLETION  Date of procedure: 08/20/23   CHA2DS2-VASc Score = 3   This indicates a 3.2% annual risk of stroke. The patient's score is based upon: CHF History: 0 HTN History: 1 Diabetes History: 0 Stroke History: 0 Vascular Disease History: 0 Age Score: 1 Gender Score: 1    CrCl 77 mL/min  Platelet count 352 K (12/2022)  Patient does not require pre-op antibiotics for dental procedure.  Per office protocol, patient can hold Eliquis for 1 days prior to procedure.   Patient will not need bridging with Lovenox (enoxaparin) around procedure.  **This guidance is not considered finalized until pre-operative APP has relayed final recommendations.**

## 2023-08-26 ENCOUNTER — Other Ambulatory Visit (HOSPITAL_COMMUNITY): Payer: Self-pay

## 2023-08-29 ENCOUNTER — Other Ambulatory Visit (HOSPITAL_COMMUNITY): Payer: Self-pay

## 2023-09-09 ENCOUNTER — Other Ambulatory Visit (HOSPITAL_COMMUNITY): Payer: Self-pay

## 2023-09-21 ENCOUNTER — Emergency Department (HOSPITAL_COMMUNITY)
Admission: EM | Admit: 2023-09-21 | Discharge: 2023-09-21 | Disposition: A | Discharge status: Home / Self Care | Attending: Emergency Medicine | Admitting: Emergency Medicine

## 2023-09-21 ENCOUNTER — Encounter (HOSPITAL_COMMUNITY): Payer: Self-pay | Admitting: Emergency Medicine

## 2023-09-21 ENCOUNTER — Other Ambulatory Visit: Payer: Self-pay

## 2023-09-21 ENCOUNTER — Emergency Department (HOSPITAL_COMMUNITY)

## 2023-09-21 DIAGNOSIS — I700 Atherosclerosis of aorta: Secondary | ICD-10-CM | POA: Diagnosis not present

## 2023-09-21 DIAGNOSIS — Z91040 Latex allergy status: Secondary | ICD-10-CM | POA: Insufficient documentation

## 2023-09-21 DIAGNOSIS — I4891 Unspecified atrial fibrillation: Secondary | ICD-10-CM | POA: Diagnosis not present

## 2023-09-21 DIAGNOSIS — Z7901 Long term (current) use of anticoagulants: Secondary | ICD-10-CM | POA: Diagnosis not present

## 2023-09-21 LAB — CBC
HCT: 46.4 % — ABNORMAL HIGH (ref 36.0–46.0)
Hemoglobin: 15.5 g/dL — ABNORMAL HIGH (ref 12.0–15.0)
MCH: 30.6 pg (ref 26.0–34.0)
MCHC: 33.4 g/dL (ref 30.0–36.0)
MCV: 91.5 fL (ref 80.0–100.0)
Platelets: 390 10*3/uL (ref 150–400)
RBC: 5.07 MIL/uL (ref 3.87–5.11)
RDW: 13.9 % (ref 11.5–15.5)
WBC: 9.6 10*3/uL (ref 4.0–10.5)
nRBC: 0 % (ref 0.0–0.2)

## 2023-09-21 LAB — BASIC METABOLIC PANEL WITH GFR
Anion gap: 12 (ref 5–15)
BUN: 13 mg/dL (ref 8–23)
CO2: 26 mmol/L (ref 22–32)
Calcium: 9.5 mg/dL (ref 8.9–10.3)
Chloride: 98 mmol/L (ref 98–111)
Creatinine, Ser: 0.86 mg/dL (ref 0.44–1.00)
GFR, Estimated: >60 mL/min (ref >60)
Glucose, Bld: 116 mg/dL — ABNORMAL HIGH (ref 70–99)
Potassium: 3.6 mmol/L (ref 3.5–5.1)
Sodium: 136 mmol/L (ref 135–145)

## 2023-09-21 NOTE — ED Notes (Signed)
 Blue and green top in Lab if needed

## 2023-09-21 NOTE — Discharge Instructions (Signed)
 Please follow-up with your cardiologist in regards to recent symptoms and ER visit.  Today your labs and imaging are reassuring and your A-fib today does appear to be rate controlled.  After long discussion we agreed to forego cardioversion at this time.  If symptoms change or worsen please return to the ER.

## 2023-09-21 NOTE — ED Triage Notes (Addendum)
 Pt arrives ambulatory by POV c/o afib noticed it yesterday. Pt tried to take cardizem  30mg  3x yesterday and 2x today. Pt believes she had syncopal episode yesterday, but did not hit head, just needed to lay down. Pt feeling slightly dizzy and SOB. Pt seeing cards 5/8.

## 2023-09-21 NOTE — ED Provider Notes (Signed)
  EMERGENCY DEPARTMENT AT [facility] Provider Note   CSN: [id number] Arrival date & time: 09/21/23  1330     History  Chief Complaint  Patient presents with   Atrial Fibrillation    Julie Clark is a 68 y.o. female history of A-fib on Eliquis  diltiazem  and metoprolol  presented for being in A-fib since 7 AM yesterday.  Patient that she does not feel she is in A-fib RVR as her heart rate she feels has been in the 90s.  Patient does see her cardiologist on the eighth to discuss an ablation.  Patient that she does have some general weakness with that but denies any chest pain or shortness of breath.  Patient denies any fevers.  Patient has been compliant with Eliquis .  Patient last took Eliquis  this morning.  Patient states that she is taken her diltiazem  and metoprolol  as prescribed and has taken 3 doses of the short acting diltiazem  however still is in A-fib.  Home Medications Prior to Admission medications   Medication Sig Start Date End Date Taking? Authorizing Provider  acetaminophen  (TYLENOL ) 500 MG tablet Take 500 mg by mouth as needed.    [provider]  azathioprine  (IMURAN ) 100 MG tablet Take 1 tablet (100 mg total) by mouth daily. 06/05/23   Pyrtle, Amber Bail, MD  cephALEXin  (KEFLEX ) 500 MG capsule Take 4 capsules (2,000 mg total) by mouth 1 hour before procedure as directed. 06/05/23     diltiazem  (CARDIZEM  CD) 180 MG 24 hr capsule Take 1 capsule (180 mg total) by mouth daily. 07/29/23   Nathanel Bal, PA-C  diltiazem  (CARDIZEM ) 30 MG tablet Take 1 tablet every 4 hours AS NEEDED for AFIB heart rate >100 as long as top BP >100. 06/26/22   Fenton, Clint R, PA  ELIQUIS  5 MG TABS tablet Take 1 tablet (5 mg total) by mouth 2 (two) times daily. 07/11/23   Camnitz, Babetta Lesch, MD  indapamide  (LOZOL ) 1.25 MG tablet Take 1 tablet (1.25 mg total) by mouth daily. 04/23/23   Arcadio Knuckles, MD  inFLIXimab  (REMICADE  IV) Inject into the vein. Every 6 weeks    [provider]  metoprolol  succinate (TOPROL -XL) 25 MG 24 hr tablet Take 1 tablet (25 mg total) by mouth 2 (two) times daily. 04/23/23   Arcadio Knuckles, MD  Polyethylene Glycol 3350  (MIRALAX PO) Take 1 Package by mouth daily. Mix powder with liquid and take once daily    [provider]      Allergies    Latex, Lovastatin, Flecainide , Protonix  [pantoprazole ], Adhesive [tape], and Penicillins    Review of Systems   Review of Systems  Physical Exam Updated Vital Signs BP 111/82   Pulse 89   Temp 98.2 F (36.8 C)   Resp (!) 22   Ht 5\' 4"  (1.626 m)   Wt 79.4 kg   SpO2 100%   BMI 30.04 kg/m  Physical Exam Constitutional:      General: She is not in acute distress. Cardiovascular:     Rate and Rhythm: Tachycardia present. Rhythm irregular.     Pulses: Normal pulses.  Pulmonary:     Effort: Pulmonary effort is normal. No respiratory distress.     Breath sounds: Normal breath sounds.  Abdominal:     Palpations: Abdomen is soft.     Tenderness: There is no abdominal tenderness. There is no guarding or rebound.  Neurological:     Mental Status: She is alert.  ED Results / Procedures / Treatments   Labs (all labs ordered are listed, but only abnormal results are displayed) Labs Reviewed  BASIC METABOLIC PANEL WITH GFR - Abnormal; Notable for the following components:      Result Value   Glucose, Bld 116 (*)    All other components within normal limits  CBC - Abnormal; Notable for the following components:   Hemoglobin 15.5 (*)    HCT 46.4 (*)    All other components within normal limits    EKG EKG Interpretation Date/Time:  Saturday September 21 2023 13:41:30 EDT Ventricular Rate:  109 PR Interval:    QRS Duration:  86 QT Interval:  344 QTC Calculation: 463 R Axis:   27  Text Interpretation: Atrial fibrillation Nonspecific ST and T wave abnormality Confirmed by Annita Kindle [id number]) on 09/21/2023 3:10:13 PM  Radiology DG Chest Port 1 View Result Date:  09/21/2023 CLINICAL DATA:  5107 Atrial fibrillation (HCC) 5107 EXAM: PORTABLE CHEST 1 VIEW COMPARISON:  09/15/2021 FINDINGS: The heart size and mediastinal contours are within normal limits. Both lungs are clear. The visualized skeletal structures are unremarkable. Artifact overlies the right apex. Trachea midline. Aorta atherosclerotic. IMPRESSION: No active disease. Electronically Signed   By: Melven Stable.  Shick M.D.   On: 09/21/2023 15:07    Procedures Procedures    Medications Ordered in ED Medications - No data to display  ED Course/ Medical Decision Making/ A&P                                 Medical Decision Making Amount and/or Complexity of Data Reviewed Labs: ordered. Radiology: ordered.   Julie Clark 68 y.o. presented today for Afib. Working DDx that I considered at this time includes, but not limited to, Afib, hypertension, ACS, thyrotoxicosis, pericarditis, pneumonia, medication induced, PE, electrolyte abnormalities, chronic lung disease, sepsis, heart failure, tobacco use, caffeine use.  R/o DDx: hypertension, ACS, thyrotoxicosis, pericarditis, pneumonia, medication induced, PE, electrolyte abnormalities, chronic lung disease, sepsis, heart failure, tobacco use, caffeine use: These are considered less likely due to history of present illness, physical exam, labs/imaging findings  Review of prior external notes: 08/15/2023 documentation  Unique Tests and My Independent Interpretation:  EKG: A-fib 109 bpm, no signs of ischemia CBC: Unremarkable BMP: Unremarkable Chest x-ray: No acute pathology  Social Determinants of Health: none  Discussion with Independent Historian:  Husband  Discussion of Management of Tests: None  Risk: Low: based on diagnostic testing/clinical impression and treatment plan  Risk Stratification Score: None  Staffed with Drury Geralds, MD  Plan: On exam patient was no acute distress with stable vitals. The cardiac monitor was ordered secondary to  reported AFib and to monitor the patient for dysrhythmia. Cardiac monitor showed A-fib at a rate of 101 by my independent interpretation.  Patient's exam was otherwise unremarkable.  Patient is currently on 180 mg of diltiazem  daily with 25 mg of Toprol  with 3 as needed daily Cardizem  30 mg tablet instant release.  Patient is taking all his medications including all 3 aunts release Cardizem 's and states she is still in A-fib.  Patient that she is still symptomatic as she feels generally weak with this.  Will get labs and EKG to further evaluate.  Patient's labs and imaging are ultimately reassuring.  Upon reevaluation and my independent rotation of the cardiac monitor patient is sitting at 97 bpm in A-fib.  Patient and I had a long discussion as  I did offer cardioversion and she states she just feels generally weak and symptomatic from the A-fib.  After long discussion patient states that she would rather follow-up with her cardiologist in to discuss the ablation on 5 8 and take the diltiazem  that she has been prescribed which is reasonable.  Physicians witnessed by husband.  Patient was given return precautions. Patient stable for discharge at this time.  Patient verbalized understanding of plan.  This chart was dictated using voice recognition software.  Despite best efforts to proofread,  errors can occur which can change the documentation meaning.        Final Clinical Impression(s) / ED Diagnoses Final diagnoses:  Atrial fibrillation, unspecified type [facility])    Rx / DC Orders ED Discharge Orders     None         Elex Grimmer 09/21/23 1535    Merdis Stalling, MD 09/21/23 [phone]

## 2023-09-23 ENCOUNTER — Telehealth: Payer: Self-pay | Admitting: Cardiology

## 2023-09-23 NOTE — Telephone Encounter (Addendum)
 Advised pt ok to get injection for her Crohns, discussed with pharmD.   Pt reports she has been in AFib since Friday.  HRs avg 80-90s.  Symptomatic, SOB, feels like she is in afib. Syncopal episode on Friday. BP is WNL.   Diltiazem  increased to 180 mg back in March.  Been using her PRN 30 mg several times daily. Pt aware will send to MD for review/advisement.  Aware he may just increase her Diltiazem  until she can be seen next week vs needs AFib clinic this week. Pt aware I will let he know MD recommendation tomorrow.

## 2023-09-23 NOTE — Telephone Encounter (Signed)
 Julie Clark, this patient just called in stating she is scheduled for an infusion in the morning. She wants to make sure it is ok to have it d/t being in AF. She has been in AF since Friday. She was at the hospital Fri/Sat due to being symptomatic but since she has her own medications she can take, PRN, she says the hospital did not feel comfortable giving her IV meds. Patient would like any other suggestions that can be given to get her out of it, would be appreciated.

## 2023-09-24 ENCOUNTER — Ambulatory Visit (INDEPENDENT_AMBULATORY_CARE_PROVIDER_SITE_OTHER): Admitting: *Deleted

## 2023-09-24 VITALS — BP 119/74 | HR 82 | Temp 97.9°F | Resp 16 | Ht 64.0 in | Wt 175.6 lb

## 2023-09-24 DIAGNOSIS — K50119 Crohn's disease of large intestine with unspecified complications: Secondary | ICD-10-CM

## 2023-09-24 DIAGNOSIS — K50114 Crohn's disease of large intestine with abscess: Secondary | ICD-10-CM | POA: Diagnosis not present

## 2023-09-24 MED ORDER — SODIUM CHLORIDE 0.9 % IV SOLN
10.0000 mg/kg | Freq: Once | INTRAVENOUS | Status: AC
  Administered 2023-09-24: 800 mg via INTRAVENOUS
  Filled 2023-09-24: qty 80

## 2023-09-24 MED ORDER — DIPHENHYDRAMINE HCL 25 MG PO CAPS: 25.0000 mg | ORAL_CAPSULE | Freq: Once | ORAL | Status: DC

## 2023-09-24 MED ORDER — ACETAMINOPHEN 325 MG PO TABS: 650.0000 mg | ORAL_TABLET | Freq: Once | ORAL | Status: DC

## 2023-09-24 MED ORDER — METHYLPREDNISOLONE SODIUM SUCC 40 MG IJ SOLR: 40.0000 mg | Freq: Once | INTRAMUSCULAR | Status: DC

## 2023-09-24 NOTE — Telephone Encounter (Signed)
 Pt scheduled to see AFib clinic this Thursday for further evaluation of tx plan. Aware they will let me know if need to call her after to schedule ablation date. Pt agreeable to plan.

## 2023-09-24 NOTE — Progress Notes (Signed)
 Diagnosis: Crohn's Disease  Provider:  Mannam, Praveen MD  Procedure: IV Infusion  IV Type: Peripheral, IV Location: R Antecubital  Avsola  (infliximab -axxq), Dose: 800mg   Infusion Start Time: 0913 am  Infusion Stop Time: 1115  Post Infusion IV Care: Observation period completed and Peripheral IV Discontinued  Discharge: Condition: Good, Destination: Home . AVS Provided and AVS Declined  Performed by:  Mayme Spearman, RN

## 2023-09-26 ENCOUNTER — Ambulatory Visit (HOSPITAL_COMMUNITY)
Admission: RE | Admit: 2023-09-26 | Discharge: 2023-09-26 | Disposition: A | Discharge status: Home / Self Care | Source: Ambulatory Visit | Attending: Internal Medicine | Admitting: Internal Medicine

## 2023-09-26 VITALS — BP 148/76 | HR 78 | Ht 64.0 in | Wt 173.2 lb

## 2023-09-26 DIAGNOSIS — I4819 Other persistent atrial fibrillation: Secondary | ICD-10-CM | POA: Diagnosis not present

## 2023-09-26 DIAGNOSIS — I4891 Unspecified atrial fibrillation: Secondary | ICD-10-CM | POA: Diagnosis not present

## 2023-09-26 NOTE — Progress Notes (Signed)
 Primary Care Physician: Julie Knuckles, MD Primary Cardiologist: Julie Cortland Ding, MD Electrophysiologist: Julie Cortland Ding, MD     Referring Physician:      [name] is a 68 y.o. female with a history of HTN, HLD, and paroxysmal atrial fibrillation who presents for consultation in the [facility]. History of flecainide  pill in pocket strategy discontinued due to syncopal episode after a dose. She currently takes diltiazem  120 mg daily and Toprol  25 mg BID. Patient is on Eliquis  5 mg BID for a CHADS2VASC score of 3.  On evaluation today, she is currently in NSR. She converted to NSR spontaneously while taking PRN diltiazem . She notes to have 1-2 episodes of Afib per year. No missed doses of Eliquis  5 mg BID.   On follow up 09/26/23, she is currently in NSR. She has been in Afib since 4/25 but appears to have just converted to normal rhythm within the past ~couple of hours. No missed doses of Eliquis .   Today, she denies symptoms of chest pain, shortness of breath, orthopnea, PND, lower extremity edema, dizziness, presyncope, syncope, snoring, daytime somnolence, bleeding, or neurologic sequela. The patient is tolerating medications without difficulties and is otherwise without complaint today.   she has a BMI of Body mass index is 29.73 kg/m.Julie Clark Filed Weights   09/26/23 1003  Weight: 78.6 kg     Current Outpatient Medications  Medication Sig Dispense Refill   acetaminophen  (TYLENOL ) 500 MG tablet Take 500 mg by mouth as needed.     azathioprine  (IMURAN ) 100 MG tablet Take 1 tablet (100 mg total) by mouth daily. 90 tablet 3   cephALEXin  (KEFLEX ) 500 MG capsule Take 4 capsules (2,000 mg total) by mouth 1 hour before procedure as directed. 28 capsule 1   diltiazem  (CARDIZEM  CD) 180 MG 24 hr capsule Take 1 capsule (180 mg total) by mouth daily. 30 capsule 3   diltiazem  (CARDIZEM ) 30 MG tablet Take 1 tablet every 4 hours AS NEEDED for AFIB heart rate  >100 as long as top BP >100. 30 tablet 1   ELIQUIS  5 MG TABS tablet Take 1 tablet (5 mg total) by mouth 2 (two) times daily. 180 tablet 0   indapamide  (LOZOL ) 1.25 MG tablet Take 1 tablet (1.25 mg total) by mouth daily. 90 tablet 2   inFLIXimab  (REMICADE  IV) Inject into the vein. Every 6 weeks     metoprolol  succinate (TOPROL -XL) 25 MG 24 hr tablet Take 1 tablet (25 mg total) by mouth 2 (two) times daily. 180 tablet 2   Polyethylene Glycol 3350  (MIRALAX PO) Take 1 Package by mouth daily. Mix powder with liquid and take once daily     Current Facility-Administered Medications  Medication Dose Route Frequency Provider Last Rate Last Admin   0.9 %  sodium chloride  infusion  500 mL Intravenous Once Pyrtle, Clark Bail, MD        Atrial Fibrillation Management history:  Previous antiarrhythmic drugs: flecainide  pill in pocket Previous cardioversions: none Previous ablations: none Anticoagulation history: Eliquis    ROS- All systems are reviewed and negative except as per the HPI above.  Physical Exam: BP (!) 148/76   Pulse 78   Ht 5\' 4"  (1.626 m)   Wt 78.6 kg   BMI 29.73 kg/m   GEN- The patient is well appearing, alert and oriented x 3 today.   Neck - no JVD or carotid bruit noted Lungs- Clear to ausculation bilaterally, normal work of breathing Heart- Regular  rate and rhythm, no murmurs, rubs or gallops, PMI not laterally displaced Extremities- no clubbing, cyanosis, or edema Skin - no rash or ecchymosis noted   EKG today demonstrates  Vent. rate 78 BPM PR interval 154 ms QRS duration 88 ms QT/QTcB 388/442 ms P-R-T axes 47 29 13 Sinus rhythm with Premature supraventricular complexes Cannot rule out Anterior infarct , age undetermined Abnormal ECG When compared with ECG of 21-Sep-2023 13:41, Sinus rhythm has replaced Atrial fibrillation  Echo 09/25/21 demonstrated  1. Left ventricular ejection fraction, by estimation, is 50 to 55%. The  left ventricle has low normal function.  The left ventricle has no regional  wall motion abnormalities. Left ventricular diastolic parameters are  consistent with Grade II diastolic  dysfunction (pseudonormalization).   2. Right ventricular systolic function is normal. The right ventricular  size is normal. There is normal pulmonary artery systolic pressure. The  estimated right ventricular systolic pressure is 30.5 mmHg.   3. The mitral valve is normal in structure. No evidence of mitral valve  regurgitation. No evidence of mitral stenosis.   4. The aortic valve is tricuspid. Aortic valve regurgitation is not  visualized. No aortic stenosis is present.   5. Frequent PVCs through study.    ASSESSMENT & PLAN CHA2DS2-VASc Score = 3  The patient's score is based upon: CHF History: 0 HTN History: 1 Diabetes History: 0 Stroke History: 0 Vascular Disease History: 0 Age Score: 1 Gender Score: 1       ASSESSMENT AND PLAN: Persistent Atrial Fibrillation (ICD10:  I48.0) The patient's CHA2DS2-VASc score is 3, indicating a 3.2% annual risk of stroke.    She is currently in NSR. We had a discussion about medication treatments and ablation in detail. We discussed potential options such as AAD therapy with their potential adverse effects and requirement for monitoring. We also discussed pulse field ablation as an option in the form of intervention. After discussion, patient mentioned being interested in ablation because she felt terrible when out of rhythm for the past week. She does not appear to have high burden, but very symptomatic when out of rhythm. Her major concern would be cost of procedure. I have recommended patient to contact insurance to find out what her copay Julie be. She appears somewhat hesitant to start AAD therapy as that would be an addition to her medication regimen.    Secondary Hypercoagulable State (ICD10:  D68.69) The patient is at significant risk for stroke/thromboembolism based upon her CHA2DS2-VASc Score of  3.  Continue Apixaban  (Eliquis ).  No missed doses.     Follow up as scheduled with Dr. Lawana Clark.    Julie Amber, PA-C  Afib Clinic [facility] [address] [place], [place] [address] [phone]

## 2023-10-02 ENCOUNTER — Other Ambulatory Visit (HOSPITAL_COMMUNITY): Payer: Self-pay

## 2023-10-02 NOTE — Progress Notes (Unsigned)
  Electrophysiology Office Note:   Date:  10/03/2023  ID:  STARLETTA MORUA, DOB 1955-12-19, MRN [id number]  Primary Cardiologist: None Primary Heart Failure: None Electrophysiologist: Varvara Legault Cortland Ding, MD      History of Present Illness:   Julie Clark is a 68 y.o. female with h/o hypertension, hyperlipidemia, atrial fibrillation seen today for routine electrophysiology followup.   Since last being seen in our clinic the patient reports doing well today.  She has no chest pain or shortness of breath.  She is not having episodes of atrial fibrillation.  She has had 2 episodes of atrial fibrillation in the last few months, 1 that lasted 3 days and 1 episode that lasted 7 days.  She feels quite poor when she is in atrial fibrillation with significant fatigue, weakness, shortness of breath.  She has had near syncope as well when in atrial fibrillation.  she denies chest pain, palpitations, dyspnea, PND, orthopnea, nausea, vomiting, dizziness, syncope, edema, weight gain, or early satiety.   Review of systems complete and found to be negative unless listed in HPI.   EP Information / Studies Reviewed:    EKG is not ordered today. EKG from 09/26/23 reviewed which showed sinus rhythm        Risk Assessment/Calculations:    CHA2DS2-VASc Score = 3   This indicates a 3.2% annual risk of stroke. The patient's score is based upon: CHF History: 0 HTN History: 1 Diabetes History: 0 Stroke History: 0 Vascular Disease History: 0 Age Score: 1 Gender Score: 1            Physical Exam:   VS:  BP 120/76 (BP Location: Left Arm, Patient Position: Sitting, Cuff Size: Normal)   Pulse 64   Ht 5\' 4"  (1.626 m)   Wt 173 lb (78.5 kg)   SpO2 97%   BMI 29.70 kg/m    Wt Readings from Last 3 Encounters:  10/03/23 173 lb (78.5 kg)  09/26/23 173 lb 3.2 oz (78.6 kg)  09/24/23 175 lb 9.6 oz (79.7 kg)     GEN: Well nourished, well developed in no acute distress NECK: No JVD; No carotid bruits CARDIAC:  Regular rate and rhythm, no murmurs, rubs, gallops RESPIRATORY:  Clear to auscultation without rales, wheezing or rhonchi  ABDOMEN: Soft, non-tender, non-distended EXTREMITIES:  No edema; No deformity   ASSESSMENT AND PLAN:    1.  Paroxysmal atrial fibrillation: She is quite symptomatic when she is in atrial fibrillation with significant fatigue, weakness, shortness of breath, near syncope.  She would benefit from rhythm control as her A-fib episodes have begun to occur more frequently.  We discussed risks and benefits of ablation versus medication management.  She would agree to ablation if she can afford it.  In the interim, we Jacklin Zwick start propafenone to 25 mg twice daily.  2.  Secondary hypercoagulable state: On Eliquis  for atrial fibrillation  3.  Hypertension: Well-controlled  Follow up with Afib Clinic as usual post procedure  Signed, Theora Vankirk Cortland Ding, MD

## 2023-10-03 ENCOUNTER — Ambulatory Visit: Attending: Cardiology | Admitting: Cardiology

## 2023-10-03 ENCOUNTER — Other Ambulatory Visit: Payer: Self-pay

## 2023-10-03 ENCOUNTER — Encounter: Payer: Self-pay | Admitting: Cardiology

## 2023-10-03 ENCOUNTER — Other Ambulatory Visit (HOSPITAL_COMMUNITY): Payer: Self-pay

## 2023-10-03 VITALS — BP 120/76 | HR 64 | Ht 64.0 in | Wt 173.0 lb

## 2023-10-03 DIAGNOSIS — I480 Paroxysmal atrial fibrillation: Secondary | ICD-10-CM

## 2023-10-03 DIAGNOSIS — I10 Essential (primary) hypertension: Secondary | ICD-10-CM | POA: Diagnosis not present

## 2023-10-03 DIAGNOSIS — I4819 Other persistent atrial fibrillation: Secondary | ICD-10-CM

## 2023-10-03 MED ORDER — PROPAFENONE HCL ER 225 MG PO CP12
225.0000 mg | ORAL_CAPSULE | Freq: Two times a day (BID) | ORAL | 3 refills | Status: DC
  Filled 2023-10-03: qty 60, 30d supply, fill #0
  Filled 2023-10-31: qty 60, 30d supply, fill #1
  Filled 2023-12-02: qty 60, 30d supply, fill #2
  Filled 2023-12-27: qty 60, 30d supply, fill #3

## 2023-10-03 NOTE — Patient Instructions (Signed)
 Medication Instructions:  Your physician has recommended you make the following change in your medication: START Propafenone 225 mg twice a day  *If you need a refill on your cardiac medications before your next appointment, please call your pharmacy*  Lab Work: None ordered  If you have any lab test that is abnormal or we need to change your treatment, we will call you to review the results.  Testing/Procedures: None ordered  Follow-Up: At [facility], you and your health needs are our priority.  As part of our continuing mission to provide you with exceptional heart care, our providers are all part of one team.  This team includes your primary Cardiologist (physician) and Advanced Practice Providers or APPs (Physician Assistants and Nurse Practitioners) who all work together to provide you with the care you need, when you need it.  Your next appointment:   To be determined  -- please let us  know if you would like to proceed OR not with ablation after checking on cost of procedure.  Provider:   Agatha Horsfall, MD    We recommend signing up for the patient portal called "MyChart".  Sign up information is provided on this After Visit Summary.  MyChart is used to connect with patients for Virtual Visits (Telemedicine).  Patients are able to view lab/test results, encounter notes, upcoming appointments, etc.  Non-urgent messages can be sent to your provider as well.   To learn more about what you can do with MyChart, go to [email or web address].   Thank you for choosing [facility]!!   Reece Cane, RN [phone]   Other Instructions   AFib ablation CPT code: 93656   Propafenone Tablets What is this medication? PROPAFENONE (proe pa FEEN one) prevents and treats a fast or irregular heartbeat (arrhythmia). It is often used to treat a type of arrhythmia known as AFib (atrial fibrillation). It works by slowing down overactive electric signals in the heart, which  stabilizes your heart rhythm. It belongs to a group of medications called antiarrhythmics. This medicine may be used for other purposes; ask your health care provider or pharmacist if you have questions. COMMON BRAND NAME(S): Rythmol What should I tell my care team before I take this medication? They need to know if you have any of these conditions: Brugada syndrome Have had a heart attack Heart failure High or low levels of electrolytes, such as magnesium, potassium, or sodium in your blood Kidney disease Liver disease Low blood pressure Lung or breathing disease, such as asthma or COPD Pacemaker Slow heartbeat An unusual or allergic reaction to propafenone, other medications, foods, dyes, or preservatives Pregnant or trying to get pregnant Breastfeeding How should I use this medication? Take this medication by mouth with water. Take it as directed on the prescription label at the same time every day. You can take it with or without food. If it upsets your stomach, take it with food. Keep taking it unless your care team tells you to stop. Talk to your care team about the use of this medication in children. Special care may be needed. Overdosage: If you think you have taken too much of this medicine contact a poison control center or emergency room at once. NOTE: This medicine is only for you. Do not share this medicine with others. What if I miss a dose? If you miss a dose, take it as soon as you can. If it is almost time for your next dose, take only that dose. Do not take double  or extra doses. What may interact with this medication? Do not take this medication with any of the following: Certain medications for fungal infections, such as fluconazole , ketoconazole, posaconazole Certain medications for irregular heartbeat, such as dronedarone Cisapride Idelalisib Nirmatrelvir; ritonavir Pimozide Saquinavir Thioridazine Tipranavir This medication may also interact with the  following: Beta blockers, such as propranolol Digoxin Grapefruit juice Orlistat Warfarin Other medications may affect the way this medication works. Talk with your care team about all the medications you take. They may suggest changes to your treatment plan to lower the risk of side effects and to make sure your medications work as intended. This list may not describe all possible interactions. Give your health care provider a list of all the medicines, herbs, non-prescription drugs, or dietary supplements you use. Also tell them if you smoke, drink alcohol, or use illegal drugs. Some items may interact with your medicine. What should I watch for while using this medication? Your condition will be monitored closely when you first begin therapy. Often, this medication is first started in a hospital or other monitored health care setting. Once you are on maintenance therapy, visit your care team for regular checks on your progress. Because your condition and use of this medication carry some risk, it is a good idea to carry an identification card, necklace or bracelet with details of your condition, medications, and care team. This medication may affect your coordination, reaction time, or judgment. Do not drive or operate machinery until you know how this medication affects you. Sit up or stand slowly to reduce the risk of dizzy or fainting spells. Drinking alcohol with this medication can increase the risk of these side effects. If you are going to have surgery, tell your care team that you are taking this medication. What side effects may I notice from receiving this medication? Side effects that you should report to your care team as soon as possible: Allergic reactions--skin rash, itching, hives, swelling of the face, lips, tongue, or throat Heart failure--shortness of breath, swelling of the ankles, feet, or hands, sudden weight gain, unusual weakness or fatigue Heart rhythm changes--fast or  irregular heartbeat, dizziness, feeling faint or lightheaded, chest pain, trouble breathing Infection--fever, chills, cough, sore throat Unusual bruising or bleeding Side effects that usually do not require medical attention (report to your care team if they continue or are bothersome): Change in taste Constipation Dizziness Fatigue Nausea This list may not describe all possible side effects. Call your doctor for medical advice about side effects. You may report side effects to FDA at 1-800-FDA-1088. Where should I keep my medication? Keep out of the reach of children and pets. Store at room temperature between 15 and 30 degrees C (59 and 86 degrees F). Protect from light. Keep container tightly closed. Throw away any unused medication after the expiration date. NOTE: This sheet is a summary. It may not cover all possible information. If you have questions about this medicine, talk to your doctor, pharmacist, or health care provider.  2024 Elsevier/Gold Standard (2022-11-15 00:00:00)

## 2023-10-08 ENCOUNTER — Encounter: Payer: Self-pay | Admitting: Internal Medicine

## 2023-10-08 ENCOUNTER — Other Ambulatory Visit: Payer: Self-pay | Admitting: Cardiology

## 2023-10-08 ENCOUNTER — Other Ambulatory Visit (HOSPITAL_COMMUNITY): Payer: Self-pay

## 2023-10-08 ENCOUNTER — Encounter: Payer: Self-pay | Admitting: Pharmacist

## 2023-10-08 ENCOUNTER — Other Ambulatory Visit: Payer: Self-pay

## 2023-10-08 DIAGNOSIS — I480 Paroxysmal atrial fibrillation: Secondary | ICD-10-CM

## 2023-10-08 MED ORDER — ELIQUIS 5 MG PO TABS
5.0000 mg | ORAL_TABLET | Freq: Two times a day (BID) | ORAL | 1 refills | Status: DC
  Filled 2023-10-08: qty 180, 90d supply, fill #0
  Filled 2024-01-08 (×2): qty 180, 90d supply, fill #1

## 2023-10-08 NOTE — Telephone Encounter (Signed)
 Eliquis  5mg  refill request received. Patient is 68 years old, weight-78.5kg, Crea-0.86 on 09/21/23, Diagnosis-Afib, and last seen by Dr. Lawana Pray on 10/03/23. Dose is appropriate based on dosing criteria. Will send in refill to requested pharmacy.

## 2023-10-09 ENCOUNTER — Other Ambulatory Visit: Payer: Self-pay

## 2023-10-31 ENCOUNTER — Other Ambulatory Visit (HOSPITAL_COMMUNITY): Payer: Self-pay

## 2023-10-31 ENCOUNTER — Other Ambulatory Visit: Payer: Self-pay

## 2023-11-05 ENCOUNTER — Ambulatory Visit: Admitting: *Deleted

## 2023-11-05 VITALS — BP 130/75 | HR 51 | Temp 97.6°F | Resp 18 | Ht 64.0 in | Wt 176.0 lb

## 2023-11-05 DIAGNOSIS — K50114 Crohn's disease of large intestine with abscess: Secondary | ICD-10-CM

## 2023-11-05 DIAGNOSIS — K50119 Crohn's disease of large intestine with unspecified complications: Secondary | ICD-10-CM

## 2023-11-05 MED ORDER — METHYLPREDNISOLONE SODIUM SUCC 40 MG IJ SOLR: 40.0000 mg | Freq: Once | INTRAMUSCULAR | Status: DC

## 2023-11-05 MED ORDER — ACETAMINOPHEN 325 MG PO TABS: 650.0000 mg | ORAL_TABLET | Freq: Once | ORAL | Status: DC

## 2023-11-05 MED ORDER — SODIUM CHLORIDE 0.9 % IV SOLN
10.0000 mg/kg | Freq: Once | INTRAVENOUS | Status: AC
  Administered 2023-11-05: 800 mg via INTRAVENOUS
  Filled 2023-11-05: qty 80

## 2023-11-05 MED ORDER — DIPHENHYDRAMINE HCL 25 MG PO CAPS: 25.0000 mg | ORAL_CAPSULE | Freq: Once | ORAL | Status: DC

## 2023-11-05 NOTE — Progress Notes (Signed)
 Diagnosis: Crohn's Disease  Provider:  Mannam, Praveen MD  Procedure: IV Infusion  IV Type: Peripheral, IV Location: R Antecubital  Avsola  (infliximab -axxq), Dose: 800 mg  Infusion Start Time: 0910 am  Infusion Stop Time: 1120 am  Post Infusion IV Care: Observation period completed and Peripheral IV Discontinued  Discharge: Condition: Good, Destination: Home . AVS Declined  Performed by:  Mayme Spearman, RN

## 2023-11-20 ENCOUNTER — Other Ambulatory Visit (HOSPITAL_COMMUNITY): Payer: Self-pay

## 2023-11-20 ENCOUNTER — Other Ambulatory Visit (HOSPITAL_COMMUNITY): Payer: Self-pay | Admitting: Internal Medicine

## 2023-11-20 DIAGNOSIS — I480 Paroxysmal atrial fibrillation: Secondary | ICD-10-CM

## 2023-11-25 ENCOUNTER — Other Ambulatory Visit: Payer: Self-pay

## 2023-11-25 ENCOUNTER — Other Ambulatory Visit (HOSPITAL_COMMUNITY): Payer: Self-pay

## 2023-11-25 MED ORDER — DILTIAZEM HCL ER COATED BEADS 180 MG PO CP24
180.0000 mg | ORAL_CAPSULE | Freq: Every day | ORAL | 1 refills | Status: DC
  Filled 2023-11-25: qty 90, 90d supply, fill #0
  Filled 2024-02-20: qty 90, 90d supply, fill #1

## 2023-12-02 ENCOUNTER — Other Ambulatory Visit (HOSPITAL_COMMUNITY): Payer: Self-pay

## 2023-12-02 ENCOUNTER — Other Ambulatory Visit: Payer: Self-pay

## 2023-12-04 ENCOUNTER — Other Ambulatory Visit (HOSPITAL_COMMUNITY): Payer: Self-pay

## 2023-12-17 ENCOUNTER — Ambulatory Visit (INDEPENDENT_AMBULATORY_CARE_PROVIDER_SITE_OTHER)

## 2023-12-17 VITALS — BP 120/69 | HR 49 | Temp 97.6°F | Resp 16 | Ht 64.0 in | Wt 176.0 lb

## 2023-12-17 DIAGNOSIS — K50119 Crohn's disease of large intestine with unspecified complications: Secondary | ICD-10-CM

## 2023-12-17 DIAGNOSIS — K50114 Crohn's disease of large intestine with abscess: Secondary | ICD-10-CM | POA: Diagnosis not present

## 2023-12-17 MED ORDER — DIPHENHYDRAMINE HCL 25 MG PO CAPS: 25.0000 mg | ORAL_CAPSULE | Freq: Once | ORAL | Status: DC

## 2023-12-17 MED ORDER — SODIUM CHLORIDE 0.9 % IV SOLN
10.0000 mg/kg | Freq: Once | INTRAVENOUS | Status: AC
  Administered 2023-12-17: 800 mg via INTRAVENOUS
  Filled 2023-12-17: qty 80

## 2023-12-17 MED ORDER — METHYLPREDNISOLONE SODIUM SUCC 40 MG IJ SOLR: 40.0000 mg | Freq: Once | INTRAMUSCULAR | Status: DC

## 2023-12-17 MED ORDER — ACETAMINOPHEN 325 MG PO TABS
650.0000 mg | ORAL_TABLET | Freq: Once | ORAL | Status: DC
Start: 2023-12-17 — End: 2023-12-17

## 2023-12-17 NOTE — Progress Notes (Signed)
 Diagnosis: Crohn's Disease  Provider:  Mannam, Praveen MD  Procedure: IV Infusion  IV Type: Peripheral, IV Location: R Antecubital  Avsola  (infliximab -axxq), Dose: 800 mg  Infusion Start Time: 0910 am  Infusion Stop Time: 1134 am  Post Infusion IV Care: Observation period completed and Peripheral IV Discontinued  Discharge: Condition: Good, Destination: Home . AVS Declined  Performed by:  Trudy Lamarr LABOR, RN

## 2023-12-27 ENCOUNTER — Other Ambulatory Visit (HOSPITAL_COMMUNITY): Payer: Self-pay

## 2023-12-30 ENCOUNTER — Other Ambulatory Visit: Payer: Self-pay | Admitting: Internal Medicine

## 2023-12-30 DIAGNOSIS — Z1231 Encounter for screening mammogram for malignant neoplasm of breast: Secondary | ICD-10-CM

## 2024-01-08 ENCOUNTER — Other Ambulatory Visit: Payer: Self-pay

## 2024-01-15 ENCOUNTER — Encounter: Payer: Self-pay | Admitting: Internal Medicine

## 2024-01-15 ENCOUNTER — Ambulatory Visit
Admission: RE | Admit: 2024-01-15 | Discharge: 2024-01-15 | Disposition: A | Discharge status: Home / Self Care | Source: Ambulatory Visit | Attending: Internal Medicine | Admitting: Internal Medicine

## 2024-01-15 DIAGNOSIS — Z1231 Encounter for screening mammogram for malignant neoplasm of breast: Secondary | ICD-10-CM

## 2024-01-23 DIAGNOSIS — D2372 Other benign neoplasm of skin of left lower limb, including hip: Secondary | ICD-10-CM | POA: Diagnosis not present

## 2024-01-23 DIAGNOSIS — D485 Neoplasm of uncertain behavior of skin: Secondary | ICD-10-CM | POA: Diagnosis not present

## 2024-01-23 DIAGNOSIS — L905 Scar conditions and fibrosis of skin: Secondary | ICD-10-CM | POA: Diagnosis not present

## 2024-01-23 DIAGNOSIS — D2360 Other benign neoplasm of skin of unspecified upper limb, including shoulder: Secondary | ICD-10-CM | POA: Diagnosis not present

## 2024-01-23 DIAGNOSIS — L720 Epidermal cyst: Secondary | ICD-10-CM | POA: Diagnosis not present

## 2024-01-23 DIAGNOSIS — L814 Other melanin hyperpigmentation: Secondary | ICD-10-CM | POA: Diagnosis not present

## 2024-01-23 DIAGNOSIS — L821 Other seborrheic keratosis: Secondary | ICD-10-CM | POA: Diagnosis not present

## 2024-01-23 DIAGNOSIS — L815 Leukoderma, not elsewhere classified: Secondary | ICD-10-CM | POA: Diagnosis not present

## 2024-01-23 DIAGNOSIS — L820 Inflamed seborrheic keratosis: Secondary | ICD-10-CM | POA: Diagnosis not present

## 2024-01-23 DIAGNOSIS — Z1283 Encounter for screening for malignant neoplasm of skin: Secondary | ICD-10-CM | POA: Diagnosis not present

## 2024-01-28 ENCOUNTER — Ambulatory Visit (INDEPENDENT_AMBULATORY_CARE_PROVIDER_SITE_OTHER)

## 2024-01-28 VITALS — BP 154/75 | HR 51 | Temp 97.9°F | Resp 18 | Ht 64.0 in | Wt 185.0 lb

## 2024-01-28 DIAGNOSIS — K50114 Crohn's disease of large intestine with abscess: Secondary | ICD-10-CM

## 2024-01-28 DIAGNOSIS — K50119 Crohn's disease of large intestine with unspecified complications: Secondary | ICD-10-CM

## 2024-01-28 MED ORDER — SODIUM CHLORIDE 0.9 % IV SOLN
10.0000 mg/kg | Freq: Once | INTRAVENOUS | Status: AC
  Administered 2024-01-28: 800 mg via INTRAVENOUS
  Filled 2024-01-28: qty 80

## 2024-01-28 MED ORDER — DIPHENHYDRAMINE HCL 25 MG PO CAPS: 25.0000 mg | ORAL_CAPSULE | Freq: Once | ORAL | Status: DC

## 2024-01-28 MED ORDER — METHYLPREDNISOLONE SODIUM SUCC 40 MG IJ SOLR: 40.0000 mg | Freq: Once | INTRAMUSCULAR | Status: DC

## 2024-01-28 MED ORDER — ACETAMINOPHEN 325 MG PO TABS: 650.0000 mg | ORAL_TABLET | Freq: Once | ORAL | Status: DC

## 2024-01-28 NOTE — Progress Notes (Signed)
 Diagnosis: Crohn's Disease  Provider:  Praveen Mannam MD  Procedure: IV Infusion  IV Type: Peripheral, IV Location: L Antecubital  Avsola  (infliximab -axxq), Dose: 800MG   Infusion Start Time: 0929  Infusion Stop Time: 1149  Post Infusion IV Care: Peripheral IV Discontinued  Discharge: Condition: Good, Destination: Home . AVS Declined  Performed by:  Leita FORBES Miles, LPN

## 2024-01-29 ENCOUNTER — Other Ambulatory Visit (HOSPITAL_COMMUNITY): Payer: Self-pay

## 2024-01-29 ENCOUNTER — Other Ambulatory Visit: Payer: Self-pay

## 2024-01-29 ENCOUNTER — Other Ambulatory Visit: Payer: Self-pay | Admitting: Cardiology

## 2024-01-29 MED ORDER — PROPAFENONE HCL ER 225 MG PO CP12
225.0000 mg | ORAL_CAPSULE | Freq: Two times a day (BID) | ORAL | 3 refills | Status: AC
  Filled 2024-01-29: qty 180, 90d supply, fill #0
  Filled 2024-05-01: qty 180, 90d supply, fill #1

## 2024-01-30 ENCOUNTER — Other Ambulatory Visit (HOSPITAL_COMMUNITY): Payer: Self-pay

## 2024-01-30 ENCOUNTER — Other Ambulatory Visit: Payer: Self-pay

## 2024-02-20 ENCOUNTER — Other Ambulatory Visit (HOSPITAL_COMMUNITY): Payer: Self-pay

## 2024-03-02 ENCOUNTER — Other Ambulatory Visit: Payer: Self-pay | Admitting: Internal Medicine

## 2024-03-02 ENCOUNTER — Other Ambulatory Visit (HOSPITAL_COMMUNITY): Payer: Self-pay

## 2024-03-02 DIAGNOSIS — I480 Paroxysmal atrial fibrillation: Secondary | ICD-10-CM

## 2024-03-02 DIAGNOSIS — I10 Essential (primary) hypertension: Secondary | ICD-10-CM

## 2024-03-04 ENCOUNTER — Other Ambulatory Visit: Payer: Self-pay

## 2024-03-04 ENCOUNTER — Other Ambulatory Visit (HOSPITAL_COMMUNITY): Payer: Self-pay

## 2024-03-04 MED ORDER — METOPROLOL SUCCINATE ER 25 MG PO TB24
25.0000 mg | ORAL_TABLET | Freq: Two times a day (BID) | ORAL | 2 refills | Status: AC
  Filled 2024-03-04: qty 180, 90d supply, fill #0
  Filled 2024-06-01: qty 180, 90d supply, fill #1

## 2024-03-10 ENCOUNTER — Ambulatory Visit (INDEPENDENT_AMBULATORY_CARE_PROVIDER_SITE_OTHER): Admitting: *Deleted

## 2024-03-10 VITALS — BP 160/75 | HR 53 | Temp 97.5°F | Resp 20 | Ht 63.0 in | Wt 181.4 lb

## 2024-03-10 DIAGNOSIS — K50119 Crohn's disease of large intestine with unspecified complications: Secondary | ICD-10-CM

## 2024-03-10 DIAGNOSIS — K50114 Crohn's disease of large intestine with abscess: Secondary | ICD-10-CM

## 2024-03-10 MED ORDER — METHYLPREDNISOLONE SODIUM SUCC 40 MG IJ SOLR: 40.0000 mg | Freq: Once | INTRAMUSCULAR | Status: DC

## 2024-03-10 MED ORDER — SODIUM CHLORIDE 0.9 % IV SOLN
10.0000 mg/kg | Freq: Once | INTRAVENOUS | Status: AC
  Administered 2024-03-10: 800 mg via INTRAVENOUS
  Filled 2024-03-10: qty 80

## 2024-03-10 MED ORDER — DIPHENHYDRAMINE HCL 25 MG PO CAPS: 25.0000 mg | ORAL_CAPSULE | Freq: Once | ORAL | Status: DC

## 2024-03-10 MED ORDER — ACETAMINOPHEN 325 MG PO TABS: 650.0000 mg | ORAL_TABLET | Freq: Once | ORAL | Status: DC

## 2024-03-10 NOTE — Progress Notes (Signed)
 Diagnosis: Crohn's Disease  Provider:  Mannam, Praveen MD  Procedure: IV Infusion  IV Type: Peripheral, IV Location: R Antecubital  Remicade  (Infliximab ), Dose: 800 mg  Infusion Start Time: 0910 am  Infusion Stop Time: 1115 am  Post Infusion IV Care: Observation period completed and Peripheral IV Discontinued  Discharge: Condition: Good, Destination: Home . AVS Declined  Performed by:  Trudy Lamarr LABOR, RN

## 2024-03-25 ENCOUNTER — Other Ambulatory Visit: Payer: Self-pay | Admitting: Cardiology

## 2024-03-25 ENCOUNTER — Other Ambulatory Visit (HOSPITAL_COMMUNITY): Payer: Self-pay

## 2024-03-25 ENCOUNTER — Other Ambulatory Visit: Payer: Self-pay | Admitting: Internal Medicine

## 2024-03-25 ENCOUNTER — Other Ambulatory Visit: Payer: Self-pay

## 2024-03-25 DIAGNOSIS — I480 Paroxysmal atrial fibrillation: Secondary | ICD-10-CM

## 2024-03-25 DIAGNOSIS — I10 Essential (primary) hypertension: Secondary | ICD-10-CM

## 2024-03-25 MED ORDER — AZATHIOPRINE 100 MG PO TABS
100.0000 mg | ORAL_TABLET | Freq: Every day | ORAL | 0 refills | Status: AC
  Filled 2024-03-25 – 2024-06-01 (×2): qty 90, 90d supply, fill #0

## 2024-03-25 MED ORDER — DILTIAZEM HCL ER COATED BEADS 180 MG PO CP24
180.0000 mg | ORAL_CAPSULE | Freq: Every day | ORAL | 1 refills | Status: AC
  Filled 2024-03-25 – 2024-05-25 (×2): qty 90, 90d supply, fill #0

## 2024-03-25 MED ORDER — ELIQUIS 5 MG PO TABS
5.0000 mg | ORAL_TABLET | Freq: Two times a day (BID) | ORAL | 1 refills | Status: AC
  Filled 2024-03-25 – 2024-04-03 (×3): qty 180, 90d supply, fill #0
  Filled 2024-07-02: qty 180, 90d supply, fill #1

## 2024-03-26 ENCOUNTER — Other Ambulatory Visit (HOSPITAL_COMMUNITY): Payer: Self-pay

## 2024-03-27 ENCOUNTER — Other Ambulatory Visit: Payer: Self-pay

## 2024-03-27 ENCOUNTER — Other Ambulatory Visit (HOSPITAL_BASED_OUTPATIENT_CLINIC_OR_DEPARTMENT_OTHER): Payer: Self-pay

## 2024-03-27 MED ORDER — INDAPAMIDE 1.25 MG PO TABS
1.2500 mg | ORAL_TABLET | Freq: Every day | ORAL | 0 refills | Status: DC
  Filled 2024-03-27: qty 90, 90d supply, fill #0

## 2024-04-03 ENCOUNTER — Other Ambulatory Visit: Payer: Self-pay

## 2024-04-03 ENCOUNTER — Other Ambulatory Visit (HOSPITAL_COMMUNITY): Payer: Self-pay

## 2024-04-21 ENCOUNTER — Ambulatory Visit: Admitting: *Deleted

## 2024-04-21 VITALS — BP 143/76 | HR 52 | Temp 97.7°F | Resp 16 | Ht 64.0 in | Wt 179.2 lb

## 2024-04-21 DIAGNOSIS — K50119 Crohn's disease of large intestine with unspecified complications: Secondary | ICD-10-CM

## 2024-04-21 DIAGNOSIS — K50114 Crohn's disease of large intestine with abscess: Secondary | ICD-10-CM

## 2024-04-21 MED ORDER — DIPHENHYDRAMINE HCL 25 MG PO CAPS: 25.0000 mg | ORAL_CAPSULE | Freq: Once | ORAL | Status: DC

## 2024-04-21 MED ORDER — SODIUM CHLORIDE 0.9 % IV SOLN
10.0000 mg/kg | Freq: Once | INTRAVENOUS | Status: AC
  Administered 2024-04-21: 800 mg via INTRAVENOUS
  Filled 2024-04-21: qty 80

## 2024-04-21 MED ORDER — METHYLPREDNISOLONE SODIUM SUCC 40 MG IJ SOLR: 40.0000 mg | Freq: Once | INTRAMUSCULAR | Status: DC

## 2024-04-21 MED ORDER — ACETAMINOPHEN 325 MG PO TABS: 650.0000 mg | ORAL_TABLET | Freq: Once | ORAL | Status: DC

## 2024-04-21 NOTE — Progress Notes (Signed)
 Diagnosis: Crohn's Disease  Provider:  Mannam, Praveen MD  Procedure: IV Infusion  IV Type: Peripheral, IV Location: L Antecubital   Avsola  (infliximab -axxq), Dose: 800 mg  Infusion Start Time: 0905 am  Infusion Stop Time: 1120 am  Post Infusion IV Care: Observation period completed and Peripheral IV Discontinued  Discharge: Condition: Good, Destination: Home . AVS Declined  Performed by:  Trudy Lamarr LABOR, RN

## 2024-05-01 ENCOUNTER — Other Ambulatory Visit (HOSPITAL_COMMUNITY): Payer: Self-pay

## 2024-05-08 ENCOUNTER — Other Ambulatory Visit (HOSPITAL_COMMUNITY): Payer: Self-pay

## 2024-05-08 MED ORDER — TOBRAMYCIN 0.3 % OP SOLN
1.0000 [drp] | Freq: Four times a day (QID) | OPHTHALMIC | 1 refills | Status: AC
  Filled 2024-05-08: qty 5, 25d supply, fill #0

## 2024-05-25 ENCOUNTER — Encounter: Payer: Self-pay | Admitting: Internal Medicine

## 2024-05-25 ENCOUNTER — Other Ambulatory Visit (HOSPITAL_COMMUNITY): Payer: Self-pay

## 2024-05-25 ENCOUNTER — Telehealth: Payer: Self-pay

## 2024-05-25 ENCOUNTER — Telehealth: Payer: Self-pay | Admitting: Internal Medicine

## 2024-05-25 DIAGNOSIS — K50119 Crohn's disease of large intestine with unspecified complications: Secondary | ICD-10-CM

## 2024-05-25 DIAGNOSIS — K50114 Crohn's disease of large intestine with abscess: Secondary | ICD-10-CM

## 2024-05-25 NOTE — Telephone Encounter (Signed)
 Pharmacy Patient Advocate Encounter   Received notification from Pt Calls Messages that prior authorization for Zymfentra  (2 Pen) 120MG /ML auto-injector kit is required/requested.   Insurance verification completed.   The patient is insured through [facility].   Per test claim: PA required; PA submitted to above mentioned insurance via Latent Key/confirmation #/EOC [id number] Status is pending

## 2024-05-25 NOTE — Telephone Encounter (Signed)
 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    This would start on 06/03/2023 in place of her infusion.  If approval is delayed she should proceed with her infusion and she could start the subcutaneous injection therapy at week 6 when she would be due another IV infusion.  Please see if this will be approved by her insurance and let me and the patient know soon as possible She should continue current dose of azathioprine 

## 2024-05-25 NOTE — Telephone Encounter (Signed)
 PA request has been Submitted. New Encounter has been or will be created for follow up. For additional info see Pharmacy Prior Auth telephone encounter from 05-25-2024.

## 2024-05-26 NOTE — Telephone Encounter (Signed)
 Pharmacy Patient Advocate Encounter  Received notification from [facility] that Prior Authorization for Zymfentra  (2 Pen) 120MG /ML auto-injector kit has been DENIED.  Full denial letter will be uploaded to the media tab. See denial reason below.  Our guideline named INFLIXIMAB -DYYB - SQ (Zymfentra ) requires the following rule(s) be met for approval:  You have tried or have a contraindication to (harmful for you to use) TWO of the following preferred medications:  A. Stelara (ustekinumab)/Yesintek (ustekinumab-kfce)/Selarsdi (ustekinmab-aekn) B. Skyrizi (risankizumab-rzaa) C. Tremfya (guselkumab) D. Omvoh (mirikizumab-mrkz) E. Humira  (adalimumab )/adalimumab -adaz/Simlandi (NOTE: Humira  requires additional step through adalimumab -adaz or Simlandi) F. Xeljanz (tofacitinib immediate-release or extended-release) (NOTE: Earma requires additional criteria of inadequate response [medication did not work] or intolerance [side effect] to a tumor necrosis factor (TNF) blocker [such as adalimumabadaz/Simlandi])  G. Rinvoq (upadacitinib) (NOTE: Rinvoq requires additional criteria of inadequate response or intolerance to a TNF blocker [such as adalimumab -adaz/Simlandi])  PA #/Case ID/Reference #: [id number]

## 2024-06-01 ENCOUNTER — Other Ambulatory Visit (HOSPITAL_COMMUNITY): Payer: Self-pay

## 2024-06-02 ENCOUNTER — Ambulatory Visit (INDEPENDENT_AMBULATORY_CARE_PROVIDER_SITE_OTHER)

## 2024-06-02 VITALS — BP 144/71 | HR 53 | Temp 98.3°F | Resp 12 | Ht 64.0 in | Wt 179.4 lb

## 2024-06-02 DIAGNOSIS — K50114 Crohn's disease of large intestine with abscess: Secondary | ICD-10-CM

## 2024-06-02 DIAGNOSIS — K50119 Crohn's disease of large intestine with unspecified complications: Secondary | ICD-10-CM

## 2024-06-02 MED ORDER — ACETAMINOPHEN 325 MG PO TABS: 650.0000 mg | ORAL_TABLET | Freq: Once | ORAL | Status: DC

## 2024-06-02 MED ORDER — METHYLPREDNISOLONE SODIUM SUCC 40 MG IJ SOLR: 40.0000 mg | Freq: Once | INTRAMUSCULAR | Status: DC

## 2024-06-02 MED ORDER — DIPHENHYDRAMINE HCL 25 MG PO CAPS: 25.0000 mg | ORAL_CAPSULE | Freq: Once | ORAL | Status: DC

## 2024-06-02 MED ORDER — SODIUM CHLORIDE 0.9 % IV SOLN
10.0000 mg/kg | Freq: Once | INTRAVENOUS | Status: AC
  Administered 2024-06-02: 800 mg via INTRAVENOUS
  Filled 2024-06-02: qty 80

## 2024-06-02 NOTE — Progress Notes (Signed)
 Diagnosis: Crohn's Disease  Provider:  Mannam, Praveen MD  Procedure: IV Infusion  IV Type: Peripheral, IV Location: L Antecubital  Avsola  (infliximab -axxq), Dose: 800 mg  Infusion Start Time: 0906  Infusion Stop Time: 1119  Post Infusion IV Care: Peripheral IV Discontinued  Discharge: Condition: Good, Destination: Home . AVS Declined  Performed by:  Rocky FORBES Search, RN

## 2024-06-05 ENCOUNTER — Encounter: Payer: Self-pay | Admitting: Internal Medicine

## 2024-06-05 NOTE — Telephone Encounter (Signed)
 I would like to appeal the patient's insurance decision to deny subcutaneous infliximab  I have written an appeal letter and routed it to both Rosina Rhymes and [facility] gastroenterology pod a nurses; the letter should also be available under the letters tab in her chart If more information is needed for this appeal please let me know soon as possible Thanks JMP

## 2024-06-05 NOTE — Telephone Encounter (Signed)
 Rosina do I need to do anything further?

## 2024-06-09 ENCOUNTER — Other Ambulatory Visit (HOSPITAL_COMMUNITY): Payer: Self-pay

## 2024-06-10 ENCOUNTER — Telehealth: Payer: Self-pay | Admitting: Pharmacist

## 2024-06-10 ENCOUNTER — Other Ambulatory Visit: Payer: Self-pay

## 2024-06-10 ENCOUNTER — Other Ambulatory Visit (HOSPITAL_COMMUNITY): Payer: Self-pay

## 2024-06-10 MED ORDER — ZYMFENTRA (2 PEN) 120 MG/ML ~~LOC~~ AJKT
1.0000 | AUTO-INJECTOR | SUBCUTANEOUS | 11 refills | Status: DC
  Filled 2024-06-10: qty 2, fill #0

## 2024-06-10 NOTE — Telephone Encounter (Signed)
 Zymfentra  PA is approved from 06/10/24 - 12/07/24.  This patient will need to meet with [facility] through the [facility] for a medication review.

## 2024-06-10 NOTE — Telephone Encounter (Signed)
 Appeal has been submitted. Will advise when response is received, please be advised that most companies may take 30 days to make a decision. Appeal letter and supporting clinical documentation have been sent to [employer].

## 2024-06-10 NOTE — Telephone Encounter (Signed)
 I spoke to the patient by phone She is aware of the new prescription and will contact [facility] to speak with Herlene about medication instructions I instructed her to begin her subcutaneous injection on 07/14/2024 and then every 2 weeks thereafter Once she has the subcutaneous infliximab  in hand she will call to cancel her next infusion with the infusion clinic  Thanks JMP

## 2024-06-10 NOTE — Addendum Note (Signed)
 Addended by: CLAUDENE NAOMIE SAILOR on: 06/10/2024 10:41 AM   Modules accepted: Orders

## 2024-06-10 NOTE — Progress Notes (Signed)
 Pharmacy Patient Advocate Encounter  Insurance verification completed.   The patient is insured through [facility]   Ran test claim for Zymfentra . Currently a quantity of 2 is a 28 day supply and the co-pay is $250.  This test claim was processed through [facility]- copay amounts may vary at other pharmacies due to pharmacy/plan contracts, or as the patient moves through the different stages of their insurance plan.

## 2024-06-10 NOTE — Progress Notes (Signed)
 Benefits investigation started in new encounter. Sent to [facility] for employee clinic visit

## 2024-06-10 NOTE — Telephone Encounter (Signed)
 Zemfentra 120 mg/ml- 1 pen SQ every 14 day rx has been sent to [facility].   Patient did have an infliximab  infusion on 06/02/24, so her first Zemfentra dose should be around 07/14/24.

## 2024-06-10 NOTE — Telephone Encounter (Signed)
 Called patient to schedule an appointment for the [facility] [employer] Specialty Medication Clinic. I was unable to reach the patient so I left a HIPAA-compliant message requesting that the patient return my call.   Herlene Fleeta Morris, PharmD, JAQUELINE, CPP Clinical Pharmacist [facility] [phone]

## 2024-06-12 ENCOUNTER — Other Ambulatory Visit: Payer: Self-pay

## 2024-06-15 ENCOUNTER — Other Ambulatory Visit: Payer: Self-pay

## 2024-06-16 ENCOUNTER — Other Ambulatory Visit (HOSPITAL_COMMUNITY): Payer: Self-pay

## 2024-06-16 ENCOUNTER — Ambulatory Visit: Attending: Internal Medicine | Admitting: Pharmacist

## 2024-06-16 ENCOUNTER — Other Ambulatory Visit: Payer: Self-pay | Admitting: Pharmacist

## 2024-06-16 ENCOUNTER — Other Ambulatory Visit: Payer: Self-pay

## 2024-06-16 DIAGNOSIS — Z79899 Other long term (current) drug therapy: Secondary | ICD-10-CM

## 2024-06-16 MED ORDER — ZYMFENTRA (2 PEN) 120 MG/ML ~~LOC~~ AJKT
1.0000 | AUTO-INJECTOR | SUBCUTANEOUS | 11 refills | Status: DC
  Filled 2024-06-17: qty 2, 28d supply, fill #0

## 2024-06-16 NOTE — Progress Notes (Signed)
 See OV from 06/16/2024.   Julie Clark, PharmD, JAQUELINE, CPP Clinical Pharmacist [facility] [phone]

## 2024-06-16 NOTE — Addendum Note (Signed)
 Addended by: FLEETA MORRIS, HERLENE L on: 06/16/2024 12:43 PM   Modules accepted: Orders

## 2024-06-16 NOTE — Progress Notes (Signed)
" °  S: Patient presents for review of their specialty medication therapy.  Patient is currently taking Zymfentra  (subQ infliximab -dyyb) for Crohn's. Patient is managed by Dr. Albertus for this.   Adherence: has not yet started home subcutaneous injections. Has been receiving infusions up to this point.  Efficacy: infusions have controlled this for her in the past.   Dosing:   Crohn disease: after completion of IV induction, 120 mg subcutaneous once every 2 weeks.   Drug-drug interactions: none  Screening: TB test: completed  Hepatitis: completed   Monitoring: S/sx of infection: none  CBC: results in [facility] from 08/2023 stable S/sx of hypersensitivity: none S/sx of malignancy: none S/sx of heart failure: none Blood pressure: managed by PCP  Other side effects: none  O:     Lab Results  Component Value Date   WBC 9.6 09/21/2023   HGB 15.5 (H) 09/21/2023   HCT 46.4 (H) 09/21/2023   MCV 91.5 09/21/2023   PLT 390 09/21/2023      Chemistry      Component Value Date/Time   NA 136 09/21/2023 1358   K 3.6 09/21/2023 1358   CL 98 09/21/2023 1358   CO2 26 09/21/2023 1358   BUN 13 09/21/2023 1358   CREATININE 0.86 09/21/2023 1358      Component Value Date/Time   CALCIUM  9.5 09/21/2023 1358   ALKPHOS 138 (H) 01/09/2023 0735   AST 28 01/09/2023 0735   ALT 30 01/09/2023 0735   BILITOT 0.6 01/09/2023 0735       A/P: 1. Medication review: Patient currently on Zymfentra  for  Crohn's. Reviewed the medication with the patient, including the following: Zymfentra  is a TNF blocking agent indicated for ankylosing spondylitis, Crohn's disease, psoriatic arthritis, plaque psoriasis, and ulcerative colitis. Use in caution in patients with seizure or CNS demyelinating disorders. Patient educated on purpose, proper use and potential adverse effects of Zymfentra . Possible adverse effects are increased risk of infections, headache, and injection site reactions. There is the possibility of an  increased risk of malignancy but it is not well understood if this increased risk is due to there medication or the disease state. There are rare cases of pancytopenia and aplastic anemia. No recommendations for any changes at this time.  Herlene Fleeta Morris, PharmD, JAQUELINE, CPP Clinical Pharmacist [facility] [phone]        "

## 2024-06-17 ENCOUNTER — Other Ambulatory Visit: Payer: Self-pay

## 2024-06-17 ENCOUNTER — Encounter: Payer: Self-pay | Admitting: Internal Medicine

## 2024-06-17 NOTE — Progress Notes (Signed)
 Specialty Pharmacy Initial Fill Coordination Note  Julie Clark is a 69 y.o. female contacted today regarding initial fill of specialty medication(s) inFLIXimab -dyyb (Zymfentra  (2 Pen))   Patient requested Delivery   Delivery date: 07/07/24   Verified address: [address]   Medication will be filled on: 07/06/24   Patient is aware of $0 copayment. Patient has copay card

## 2024-06-18 ENCOUNTER — Other Ambulatory Visit: Payer: Self-pay

## 2024-06-19 ENCOUNTER — Telehealth: Payer: Self-pay

## 2024-06-19 NOTE — Telephone Encounter (Signed)
 Auth Submission: APPROVED Site of care: Site of care: [facility] Payer: [employer] Medication & CPT/J Code(s) submitted: Avsola  (infliximab -axxq) [id number] Route of submission (phone, fax, portal): fax Phone # Fax # [phone]  Auth type: Buy/Bill PB Units/visits requested: 800mg  x 9 doses Reference number: [id number] Approval from: 06/02/24 to 06/01/25

## 2024-06-22 ENCOUNTER — Other Ambulatory Visit: Payer: Self-pay | Admitting: Internal Medicine

## 2024-06-22 ENCOUNTER — Other Ambulatory Visit (HOSPITAL_COMMUNITY): Payer: Self-pay

## 2024-06-22 DIAGNOSIS — I10 Essential (primary) hypertension: Secondary | ICD-10-CM

## 2024-06-23 ENCOUNTER — Other Ambulatory Visit: Payer: Self-pay

## 2024-07-01 ENCOUNTER — Telehealth: Payer: Self-pay | Admitting: *Deleted

## 2024-07-01 NOTE — Telephone Encounter (Signed)
 Patient contacts our office today indicating that she was recently approved for infliximab  subcutaneous injections (Zymfentra ) and was going to be starting those 07/14/24 in place of IV infliximab .  However, patient indicates that she has decided to move forward with Medicare coverage in the near future rather than [employer] (through [facility]) and is aware Medicare will NOT cover SQ infliximab . She has instead decided to continue infliximab  IV infusions.  [facility]- I discontinued the Zymfentra  Rx that was originally to go out to patient on 07/06/24. Is there anything else that needs to be done on the physician office side to stop the process of sending out the Zymfentra ?  [facility]- Patient is currently scheduled for infliximab  IV 07/14/24. Will we need to update any therapy orders or is everything good to go for now? I understand that once she changes insurances, we will have to go through that approval process.

## 2024-07-02 ENCOUNTER — Other Ambulatory Visit (HOSPITAL_COMMUNITY): Payer: Self-pay

## 2024-07-02 ENCOUNTER — Other Ambulatory Visit: Payer: Self-pay

## 2024-07-02 ENCOUNTER — Other Ambulatory Visit: Payer: Self-pay | Admitting: Pharmacy Technician

## 2024-07-02 ENCOUNTER — Other Ambulatory Visit: Payer: Self-pay | Admitting: Internal Medicine

## 2024-07-02 ENCOUNTER — Telehealth: Payer: Self-pay

## 2024-07-02 DIAGNOSIS — I10 Essential (primary) hypertension: Secondary | ICD-10-CM

## 2024-07-02 MED ORDER — INDAPAMIDE 1.25 MG PO TABS
1.2500 mg | ORAL_TABLET | Freq: Every day | ORAL | 0 refills | Status: AC
  Filled 2024-07-02: qty 35, 35d supply, fill #0

## 2024-07-02 NOTE — Progress Notes (Signed)
 Disenrolled; Note found in patient chart Per RN Naomie RAMAN. Patient decided to continue infliximab  IV infusions. Had Rph Beth review agree to disenroll.

## 2024-07-02 NOTE — Telephone Encounter (Signed)
 Copied from CRM [id number]. Topic: Clinical - Medication Question >> Jul 02, 2024 11:00 AM Julie Clark wrote: Reason for CRM: Patient called needing indapamide  (LOZOL ) 1.25 MG tablet filled but needed appointment, is now scheduled for 08/06/24 would like to know if her 90 day supply can be sent in.

## 2024-07-02 NOTE — Telephone Encounter (Signed)
 Temp supply has been sent in to get the patient to her appointment she has been made aware and gave a verbal understanding.

## 2024-07-03 ENCOUNTER — Other Ambulatory Visit (HOSPITAL_COMMUNITY): Payer: Self-pay

## 2024-07-03 NOTE — Telephone Encounter (Signed)
 Julie Clark, She currently has an auth approved [employer] until 2027 for infusions.  Once the patient changes her insurance we will need to conduct another benefits investigation and submit a new auth.  I will f/u once we have updated insurance info.  Thanks

## 2024-07-14 ENCOUNTER — Ambulatory Visit

## 2024-08-06 ENCOUNTER — Ambulatory Visit: Admitting: Internal Medicine
